# Patient Record
Sex: Male | Born: 1950 | Race: Black or African American | Hispanic: No | Marital: Single | Smoking: Current some day smoker
Health system: Southern US, Community
[De-identification: ages and names within clinical notes are randomized; demographics above are authoritative.]

## PROBLEM LIST (undated history)

## (undated) DIAGNOSIS — R160 Hepatomegaly, not elsewhere classified: Secondary | ICD-10-CM

## (undated) DIAGNOSIS — K219 Gastro-esophageal reflux disease without esophagitis: Secondary | ICD-10-CM

## (undated) DIAGNOSIS — F1020 Alcohol dependence, uncomplicated: Secondary | ICD-10-CM

## (undated) DIAGNOSIS — E785 Hyperlipidemia, unspecified: Secondary | ICD-10-CM

## (undated) DIAGNOSIS — B1920 Unspecified viral hepatitis C without hepatic coma: Secondary | ICD-10-CM

## (undated) DIAGNOSIS — C801 Malignant (primary) neoplasm, unspecified: Secondary | ICD-10-CM

## (undated) DIAGNOSIS — M1990 Unspecified osteoarthritis, unspecified site: Secondary | ICD-10-CM

## (undated) DIAGNOSIS — F1910 Other psychoactive substance abuse, uncomplicated: Secondary | ICD-10-CM

## (undated) DIAGNOSIS — I10 Essential (primary) hypertension: Secondary | ICD-10-CM

## (undated) DIAGNOSIS — H269 Unspecified cataract: Secondary | ICD-10-CM

## (undated) DIAGNOSIS — R911 Solitary pulmonary nodule: Secondary | ICD-10-CM

## (undated) DIAGNOSIS — I700 Atherosclerosis of aorta: Secondary | ICD-10-CM

## (undated) HISTORY — DX: Hepatomegaly, not elsewhere classified: R16.0

## (undated) HISTORY — DX: Alcohol dependence, uncomplicated: F10.20

## (undated) HISTORY — DX: Unspecified osteoarthritis, unspecified site: M19.90

## (undated) HISTORY — DX: Atherosclerosis of aorta: I70.0

## (undated) HISTORY — DX: Other psychoactive substance abuse, uncomplicated: F19.10

## (undated) HISTORY — DX: Gastro-esophageal reflux disease without esophagitis: K21.9

## (undated) HISTORY — PX: MANDIBLE FRACTURE SURGERY: SHX706

## (undated) HISTORY — DX: Hyperlipidemia, unspecified: E78.5

## (undated) HISTORY — DX: Solitary pulmonary nodule: R91.1

## (undated) HISTORY — PX: OTHER SURGICAL HISTORY: SHX169

## (undated) HISTORY — DX: Unspecified viral hepatitis C without hepatic coma: B19.20

---

## 2012-05-28 ENCOUNTER — Emergency Department (HOSPITAL_COMMUNITY)
Admission: EM | Admit: 2012-05-28 | Discharge: 2012-05-28 | Disposition: A | Discharge status: Home / Self Care | Payer: Self-pay | Attending: Emergency Medicine | Admitting: Emergency Medicine

## 2012-05-28 ENCOUNTER — Encounter (HOSPITAL_COMMUNITY): Payer: Self-pay | Admitting: Physical Medicine and Rehabilitation

## 2012-05-28 DIAGNOSIS — L03019 Cellulitis of unspecified finger: Secondary | ICD-10-CM | POA: Insufficient documentation

## 2012-05-28 DIAGNOSIS — IMO0002 Reserved for concepts with insufficient information to code with codable children: Secondary | ICD-10-CM

## 2012-05-28 DIAGNOSIS — F17200 Nicotine dependence, unspecified, uncomplicated: Secondary | ICD-10-CM | POA: Insufficient documentation

## 2012-05-28 DIAGNOSIS — Y92009 Unspecified place in unspecified non-institutional (private) residence as the place of occurrence of the external cause: Secondary | ICD-10-CM | POA: Insufficient documentation

## 2012-05-28 DIAGNOSIS — Y93E8 Activity, other personal hygiene: Secondary | ICD-10-CM | POA: Insufficient documentation

## 2012-05-28 DIAGNOSIS — W278XXA Contact with other nonpowered hand tool, initial encounter: Secondary | ICD-10-CM | POA: Insufficient documentation

## 2012-05-28 MED ORDER — HYDROCODONE-ACETAMINOPHEN 5-325 MG PO TABS: 2.0000 | ORAL_TABLET | ORAL | Status: DC | PRN

## 2012-05-28 MED ORDER — SULFAMETHOXAZOLE-TRIMETHOPRIM 800-160 MG PO TABS: 1.0000 | ORAL_TABLET | Freq: Two times a day (BID) | ORAL | Status: DC

## 2012-05-28 MED ORDER — HYDROCODONE-ACETAMINOPHEN 5-325 MG PO TABS
2.0000 | ORAL_TABLET | Freq: Once | ORAL | Status: AC
  Administered 2012-05-28: 2 via ORAL
  Filled 2012-05-28: qty 2

## 2012-05-28 NOTE — ED Notes (Signed)
Discharge instructions reviewed. Pt verbalized understanding.  

## 2012-05-28 NOTE — ED Provider Notes (Signed)
History     CSN: [id number]  Arrival date & time 05/28/12  7829   First MD Initiated Contact with Patient 05/28/12 1004      Chief Complaint  Patient presents with  . Finger Injury    (Consider location/radiation/quality/duration/timing/severity/associated sxs/prior treatment) HPI Comments: Patient is a 61 year old male who presents with a 1 week history of finger pain. The pain is located to his distal left 3rd finger and described as throbbing and severe. Patient believes he injured it when clipping his nails one week ago. The pain does not radiate and has associated swelling and redness of skin. Patient has not tried anything for relief. No aggravating/alleviating factors. Patient denies headaches, fever, chills, chest pain, SOB, abdominal pain, nvd, joint pain, red streaking of affected arm.    No past medical history on file.  No past surgical history on file.  History reviewed. No pertinent family history.  History  Substance Use Topics  . Smoking status: Current Every Day Smoker    Types: Cigarettes  . Smokeless tobacco: Not on file  . Alcohol Use: Yes      Review of Systems  Musculoskeletal:       Finger pain.  Skin: Positive for color change.  All other systems reviewed and are negative.    Allergies  Review of patient's allergies indicates no known allergies.  Home Medications  No current outpatient prescriptions on file.  BP 162/92  Pulse 99  Temp 98.4 F (36.9 C) (Oral)  Resp 22  SpO2 100%  Physical Exam  Nursing note and vitals reviewed. Constitutional: He is oriented to person, place, and time. He appears well-developed and well-nourished. No distress.  HENT:  Head: Normocephalic and atraumatic.  Eyes: Conjunctivae normal and EOM are normal. Pupils are equal, round, and reactive to light.  Neck: Normal range of motion. Neck supple.  Cardiovascular: Normal rate and regular rhythm.  Exam reveals no gallop and no friction rub.   No murmur  heard. Pulmonary/Chest: Effort normal and breath sounds normal. He has no wheezes. He has no rales. He exhibits no tenderness.  Abdominal: Soft. He exhibits no distension. There is no tenderness. There is no rebound and no guarding.  Musculoskeletal: Normal range of motion.       Distal left 3rd digit has notable swelling and tenderness to palpation around nail bed. Swelling is fluctuant and limited to distal phalanx. No finger pad tenderness. No other affected fingers.   Neurological: He is alert and oriented to person, place, and time. Coordination normal.       Strength and sensation equal and intact bilaterally. Speech is goal-oriented. Moves limbs without ataxia.   Skin: Skin is warm and dry. He is not diaphoretic.       Skin color change difficult to assess due to patient's dark complexion.   Psychiatric: He has a normal mood and affect. His behavior is normal.    ED Course  Procedures (including critical care time)  INCISION AND DRAINAGE Performed by: Emilia Beck Consent: Verbal consent obtained. Risks and benefits: risks, benefits and alternatives were discussed Type: abscess  Body area: distal left 3rd finger  Anesthesia: none  Complexity: complex Blunt dissection to break up loculations  Drainage: purulent  Drainage amount: 2 mL  Packing material: none  Patient tolerance: Patient tolerated the procedure well with no immediate complications.     Labs Reviewed - No data to display No results found.   1. Paronychia       MDM  10:17 AM Patient has distal left 3rd finger swelling. Likely caused by paronychia. I will give him pain medication and drain it.   11:40 AM Patient's paronychia drained without complication. He will go home with Bactrim and Vicodin. He should return with worsening or concerning symptoms. No further evaluation needed at this time.       Emilia Beck, PA-C 06/09/12 1143

## 2012-05-28 NOTE — ED Notes (Addendum)
Pt presents to department for evaluation of L middle finger swelling and pain. States swelling began last week when he clipped fingernail. No obvious deformities noted. 8/10 pain upon arrival. He is conscious alert and oriented x4. No signs of distress noted.

## 2012-05-28 NOTE — ED Notes (Addendum)
Pt states he doesn't know what happened to his finger, he remembers pulling a hang nail from the finger and days later it began to swell up and turn colors. Discoloration and swelling noted on left middle finger.

## 2012-06-11 NOTE — ED Provider Notes (Signed)
Medical screening examination/treatment/procedure(s) were performed by non-physician practitioner and as supervising physician I was immediately available for consultation/collaboration.   Lenette Rau, MD 06/11/12 0701 

## 2013-11-02 ENCOUNTER — Emergency Department (HOSPITAL_COMMUNITY)
Admission: EM | Admit: 2013-11-02 | Discharge: 2013-11-03 | Disposition: A | Discharge status: Home / Self Care | Payer: Medicaid Other | Attending: Emergency Medicine | Admitting: Emergency Medicine

## 2013-11-02 ENCOUNTER — Encounter (HOSPITAL_COMMUNITY): Payer: Self-pay | Admitting: Emergency Medicine

## 2013-11-02 ENCOUNTER — Emergency Department (HOSPITAL_COMMUNITY): Payer: Medicaid Other

## 2013-11-02 DIAGNOSIS — H269 Unspecified cataract: Secondary | ICD-10-CM | POA: Insufficient documentation

## 2013-11-02 DIAGNOSIS — R066 Hiccough: Secondary | ICD-10-CM | POA: Insufficient documentation

## 2013-11-02 DIAGNOSIS — F17200 Nicotine dependence, unspecified, uncomplicated: Secondary | ICD-10-CM | POA: Insufficient documentation

## 2013-11-02 DIAGNOSIS — G8929 Other chronic pain: Secondary | ICD-10-CM | POA: Insufficient documentation

## 2013-11-02 DIAGNOSIS — Z7982 Long term (current) use of aspirin: Secondary | ICD-10-CM | POA: Insufficient documentation

## 2013-11-02 DIAGNOSIS — R079 Chest pain, unspecified: Secondary | ICD-10-CM | POA: Insufficient documentation

## 2013-11-02 HISTORY — DX: Unspecified cataract: H26.9

## 2013-11-02 LAB — BASIC METABOLIC PANEL
BUN: 9 mg/dL (ref 6–23)
CHLORIDE: 100 meq/L (ref 96–112)
CO2: 25 meq/L (ref 19–32)
CREATININE: 0.7 mg/dL (ref 0.50–1.35)
Calcium: 9.9 mg/dL (ref 8.4–10.5)
GFR calc Af Amer: >90 mL/min (ref >90)
GFR calc non Af Amer: >90 mL/min (ref >90)
GLUCOSE: 98 mg/dL (ref 70–99)
Potassium: 4.6 mEq/L (ref 3.7–5.3)
Sodium: 139 mEq/L (ref 137–147)

## 2013-11-02 LAB — CBC
HEMATOCRIT: 43.8 % (ref 39.0–52.0)
Hemoglobin: 15.2 g/dL (ref 13.0–17.0)
MCH: 30.4 pg (ref 26.0–34.0)
MCHC: 34.7 g/dL (ref 30.0–36.0)
MCV: 87.6 fL (ref 78.0–100.0)
Platelets: 247 10*3/uL (ref 150–400)
RBC: 5 MIL/uL (ref 4.22–5.81)
RDW: 13.8 % (ref 11.5–15.5)
WBC: 5.8 10*3/uL (ref 4.0–10.5)

## 2013-11-02 NOTE — ED Notes (Signed)
Pt reports having the hiccups x 3 weeks. Having mid chest pain and "his chest draws up" x 1 month. Reports sob and nausea. No acute distress noted at triage, ekg done.

## 2013-11-02 NOTE — ED Notes (Signed)
MD at bedside. 

## 2013-11-02 NOTE — ED Provider Notes (Signed)
CSN: [id number]     Arrival date & time 11/02/13  1840 History   First MD Initiated Contact with Patient 11/02/13 2323     Chief Complaint  Patient presents with  . Chest Pain     (Consider location/radiation/quality/duration/timing/severity/associated sxs/prior Treatment) HPI Comments: 63 yo AA male with h/o chronic CP presents to ER with cc of CP and hiccups (intermittent) for 3 weeks.    Allergies: nkda Meds: tylenol PMH: no h/o AMI, + smoking, no Street drugs, suspects HTN, No DM  Patient is a 63 y.o. male presenting with chest pain. The history is provided by the patient.  Chest Pain Pain location:  Substernal area Pain quality: aching   Pain radiates to:  Does not radiate Pain radiates to the back: no   Pain severity:  Mild Onset quality:  Gradual Duration:  36 months Timing:  Constant Progression:  Resolved Chronicity:  Chronic Context: not breathing, no drug use, not eating, no intercourse, not lifting, no movement, not raising an arm, not at rest, no stress and no trauma   Relieved by:  None tried Worsened by:  Nothing tried Ineffective treatments:  None tried Associated symptoms: no abdominal pain, no AICD problem, no altered mental status, no anorexia, no anxiety, no back pain, no claudication, no cough, no diaphoresis, no dizziness, no dysphagia, no fatigue, no fever, no headache, no heartburn, no lower extremity edema, no nausea, no near-syncope, no numbness, no orthopnea, no palpitations, no PND, no shortness of breath, no syncope, not vomiting and no weakness     Past Medical History  Diagnosis Date  . Cataract    History reviewed. No pertinent past surgical history. History reviewed. No pertinent family history. History  Substance Use Topics  . Smoking status: Current Every Day Smoker    Types: Cigarettes  . Smokeless tobacco: Not on file  . Alcohol Use: Yes    Review of Systems  Constitutional: Negative for fever, chills, diaphoresis, activity change,  appetite change and fatigue.  HENT: Negative.  Negative for trouble swallowing.   Eyes:       H/o cataracts; scheduled for cataract surgery in April  Respiratory: Negative for apnea, cough, choking, chest tightness, shortness of breath, wheezing and stridor.   Cardiovascular: Positive for chest pain. Negative for palpitations, orthopnea, claudication, leg swelling, syncope, PND and near-syncope.  Gastrointestinal: Negative for heartburn, nausea, vomiting, abdominal pain and anorexia.  Endocrine: Negative.   Genitourinary: Negative.   Musculoskeletal: Negative for back pain, gait problem, joint swelling and myalgias.  Skin: Negative.   Allergic/Immunologic: Negative.   Neurological: Negative for dizziness, weakness, numbness and headaches.      Allergies  Review of patient's allergies indicates no known allergies.  Home Medications   Current Outpatient Rx  Name  Route  Sig  Dispense  Refill  . aspirin 81 MG chewable tablet   Oral   Chew 4 tablets (324 mg total) by mouth daily.   90 tablet   0    BP 131/84  Pulse 91  Temp(Src) 98.4 F (36.9 C) (Oral)  Resp 18  Ht 5\' 6"  (1.676 m)  Wt 135 lb (61.236 kg)  BMI 21.80 kg/m2  SpO2 98% Physical Exam  Nursing note and vitals reviewed. Constitutional: He is oriented to person, place, and time. He appears well-developed and well-nourished. No distress.  HENT:  Head: Normocephalic and atraumatic.  Eyes: Conjunctivae are normal.  bilat cataracts  Neck: Normal range of motion. Neck supple. No JVD present. No tracheal deviation present.  Cardiovascular: Normal rate, regular rhythm and normal heart sounds.  Exam reveals no friction rub.   No murmur heard. Pulmonary/Chest: Effort normal and breath sounds normal. No stridor. He has no wheezes. He has no rales.  Abdominal: Soft. Bowel sounds are normal. There is no tenderness. There is no rebound and no guarding.  Musculoskeletal: Normal range of motion. He exhibits no edema and no  tenderness.  Neurological: He is alert and oriented to person, place, and time. He has normal reflexes. A cranial nerve deficit is present. Coordination normal.  Skin: Skin is warm and dry. He is not diaphoretic.    ED Course  Procedures (including critical care time) Labs Review Labs Reviewed  CBC  BASIC METABOLIC PANEL  I-STAT [place], ED  [facility], ED   Imaging Review Dg Chest 2 View  11/02/2013   CLINICAL DATA:  Chest pain.  Hiccups.  EXAM: CHEST  2 VIEW  COMPARISON:  None.  FINDINGS: The heart size and mediastinal contours are within normal limits. Both lungs are clear. There is slight sclerosis of the right sternoclavicular joint. No free air under the diaphragm. In no visible dilated loops of bowel. Bilateral nipple shadows.  IMPRESSION: No acute abnormalities.   Electronically Signed   By: Rozetta Nunnery M.D.   On: 11/02/2013 20:12     EKG Interpretation   Date/Time:  Thursday November 02 2013 18:40:27 EDT Ventricular Rate:  109 PR Interval:  174 QRS Duration: 82 QT Interval:  318 QTC Calculation: 428 R Axis:   85 Text Interpretation:  Sinus tachycardia Minimal voltage criteria for LVH,  may be normal variant Septal infarct , age undetermined Abnormal ECG  Confirmed by [facility]  MD, DAVID ([id number]) on 11/02/2013 11:31:42 PM      MDM   Final diagnoses:  Chest pain  Hiccups    63 year old African male presents emergency bar with chief complaint of 3 weeks of chest pain and hiccups.  Prolonged stay in the emergency department awaiting results. Patient feeling much better and requesting to go on. ER workup negative to include EKG, chest x-ray, troponin two set r/o.  Doubt ACS, AD, PTx, pna, or other emergent etiology.    Patient counseled that he could potentially be admitted for his symptoms and have a further evaluation however he states he wants to go home. Strict ER precautions were given patient agrees with plan.  He is aware of the possibilities of an  undiagnosed/untreated emergent etiology for his symptoms and he is clear in thought and logic.    I initially was going to d/c him with ASA, but he has an upcoming ocular surgery (cataract) so I opted to hold.  He was informed of my thoughts on ASA.      Elmer Sow, MD 11/03/13 [phone]

## 2013-11-02 NOTE — ED Notes (Signed)
I-Stat troponin first draw never resulted in EPIC, consulted with mini-lab, result time 1944: 0.00.

## 2013-11-03 LAB — I-STAT TROPONIN, ED
Troponin i, poc: 0 ng/mL (ref 0.00–0.08)
Troponin i, poc: 0 ng/mL (ref 0.00–0.08)

## 2013-11-03 MED ORDER — ASPIRIN 81 MG PO CHEW: 324.0000 mg | CHEWABLE_TABLET | Freq: Every day | ORAL | Status: DC

## 2013-11-03 NOTE — Discharge Instructions (Signed)
Chest Pain (Nonspecific) °It is often hard to give a specific diagnosis for the cause of chest pain. There is always a chance that your pain could be related to something serious, such as a heart attack or a blood clot in the lungs. You need to follow up with your caregiver for further evaluation. °CAUSES  °· Heartburn. °· Pneumonia or bronchitis. °· Anxiety or stress. °· Inflammation around your heart (pericarditis) or lung (pleuritis or pleurisy). °· A blood clot in the lung. °· A collapsed lung (pneumothorax). It can develop suddenly on its own (spontaneous pneumothorax) or from injury (trauma) to the chest. °· Shingles infection (herpes zoster virus). °The chest wall is composed of bones, muscles, and cartilage. Any of these can be the source of the pain. °· The bones can be bruised by injury. °· The muscles or cartilage can be strained by coughing or overwork. °· The cartilage can be affected by inflammation and become sore (costochondritis). °DIAGNOSIS  °Lab tests or other studies, such as X-rays, electrocardiography, stress testing, or cardiac imaging, may be needed to find the cause of your pain.  °TREATMENT  °· Treatment depends on what may be causing your chest pain. Treatment may include: °· Acid blockers for heartburn. °· Anti-inflammatory medicine. °· Pain medicine for inflammatory conditions. °· Antibiotics if an infection is present. °· You may be advised to change lifestyle habits. This includes stopping smoking and avoiding alcohol, caffeine, and chocolate. °· You may be advised to keep your head raised (elevated) when sleeping. This reduces the chance of acid going backward from your stomach into your esophagus. °· Most of the time, nonspecific chest pain will improve within 2 to 3 days with rest and mild pain medicine. °HOME CARE INSTRUCTIONS  °· If antibiotics were prescribed, take your antibiotics as directed. Finish them even if you start to feel better. °· For the next few days, avoid physical  activities that bring on chest pain. Continue physical activities as directed. °· Do not smoke. °· Avoid drinking alcohol. °· Only take over-the-counter or prescription medicine for pain, discomfort, or fever as directed by your caregiver. °· Follow your caregiver's suggestions for further testing if your chest pain does not go away. °· Keep any follow-up appointments you made. If you do not go to an appointment, you could develop lasting (chronic) problems with pain. If there is any problem keeping an appointment, you must call to reschedule. °SEEK MEDICAL CARE IF:  °· You think you are having problems from the medicine you are taking. Read your medicine instructions carefully. °· Your chest pain does not go away, even after treatment. °· You develop a rash with blisters on your chest. °SEEK IMMEDIATE MEDICAL CARE IF:  °· You have increased chest pain or pain that spreads to your arm, neck, jaw, back, or abdomen. °· You develop shortness of breath, an increasing cough, or you are coughing up blood. °· You have severe back or abdominal pain, feel nauseous, or vomit. °· You develop severe weakness, fainting, or chills. °· You have a fever. °THIS IS AN EMERGENCY. Do not wait to see if the pain will go away. Get medical help at once. Call your local emergency services (911 in [place] Do not drive yourself to the hospital. °MAKE SURE YOU:  °· Understand these instructions. °· Will watch your condition. °· Will get help right away if you are not doing well or get worse. °Document Released: 04/29/2005 Document Revised: 10/12/2011 Document Reviewed: 02/23/2008 °ExitCare® Patient Information ©2014 [employer]   [employer]. ° °

## 2013-11-03 NOTE — ED Notes (Signed)
MD did not end up sending pt home with prescription for aspirin, pt has an upcoming surgery. Pt verbalizes understanding.

## 2013-11-03 NOTE — ED Notes (Signed)
Pt reports he has to go. MD informed.

## 2014-01-05 ENCOUNTER — Ambulatory Visit: Payer: Medicaid Other | Attending: Internal Medicine | Admitting: Internal Medicine

## 2014-01-05 ENCOUNTER — Encounter: Payer: Self-pay | Admitting: Internal Medicine

## 2014-01-05 VITALS — BP 162/97 | HR 100 | Temp 99.2°F | Resp 17 | Wt 136.0 lb

## 2014-01-05 DIAGNOSIS — I10 Essential (primary) hypertension: Secondary | ICD-10-CM | POA: Diagnosis not present

## 2014-01-05 DIAGNOSIS — F17200 Nicotine dependence, unspecified, uncomplicated: Secondary | ICD-10-CM | POA: Insufficient documentation

## 2014-01-05 DIAGNOSIS — Z7982 Long term (current) use of aspirin: Secondary | ICD-10-CM | POA: Insufficient documentation

## 2014-01-05 DIAGNOSIS — Z720 Tobacco use: Secondary | ICD-10-CM | POA: Insufficient documentation

## 2014-01-05 DIAGNOSIS — H269 Unspecified cataract: Secondary | ICD-10-CM | POA: Diagnosis not present

## 2014-01-05 LAB — CBC WITH DIFFERENTIAL/PLATELET
BASOS ABS: 0 10*3/uL (ref 0.0–0.1)
Basophils Relative: 0 % (ref 0–1)
Eosinophils Absolute: 0.1 10*3/uL (ref 0.0–0.7)
Eosinophils Relative: 1 % (ref 0–5)
HEMATOCRIT: 38.3 % — AB (ref 39.0–52.0)
HEMOGLOBIN: 12.8 g/dL — AB (ref 13.0–17.0)
LYMPHS PCT: 20 % (ref 12–46)
Lymphs Abs: 1.9 10*3/uL (ref 0.7–4.0)
MCH: 29.2 pg (ref 26.0–34.0)
MCHC: 33.4 g/dL (ref 30.0–36.0)
MCV: 87.2 fL (ref 78.0–100.0)
MONO ABS: 1.1 10*3/uL — AB (ref 0.1–1.0)
MONOS PCT: 12 % (ref 3–12)
NEUTROS ABS: 6.2 10*3/uL (ref 1.7–7.7)
Neutrophils Relative %: 67 % (ref 43–77)
Platelets: 162 10*3/uL (ref 150–400)
RBC: 4.39 MIL/uL (ref 4.22–5.81)
RDW: 13.2 % (ref 11.5–15.5)
WBC: 9.3 10*3/uL (ref 4.0–10.5)

## 2014-01-05 MED ORDER — AMLODIPINE BESYLATE 2.5 MG PO TABS: 2.5000 mg | ORAL_TABLET | Freq: Every day | ORAL | Status: DC

## 2014-01-05 NOTE — Progress Notes (Signed)
Patient Demographics  Calvin Gardner, is a 63 y.o. male  OEU:[id number]  XVQ:[id number]  DOB - [birth year]  CC:  Chief Complaint  Patient presents with  . Establish Care       HPI: Calvin Gardner is a 63 y.o. male here today to establish medical care. Patient has history of right eye cataract and is scheduled for surgery next week, today his blood pressure is elevated denies any headache dizziness chest and shortness of breath, EMR reviewed his blood pressure had been elevated in the past, patient is not sure about family history of  blood pressure problem, patient does not take any medications he does smoke cigarettes everyday, have advised patient to quit smoking, used to drink heavily in the past but he has not drink for the last 10 days  Patient has No headache, No chest pain, No abdominal pain - No Nausea, No new weakness tingling or numbness, No Cough - SOB.  No Known Allergies Past Medical History  Diagnosis Date  . Cataract    Current Outpatient Prescriptions on File Prior to Visit  Medication Sig Dispense Refill  . aspirin 81 MG chewable tablet Chew 4 tablets (324 mg total) by mouth daily.  90 tablet  0   No current facility-administered medications on file prior to visit.   History reviewed. No pertinent family history. History   Social History  . Marital Status: Divorced    Spouse Name: N/A    Number of Children: N/A  . Years of Education: N/A   Occupational History  . Not on file.   Social History Main Topics  . Smoking status: Current Every Day Smoker    Types: Cigarettes  . Smokeless tobacco: Not on file  . Alcohol Use: 6.0 oz/week    10 Cans of beer per week     Comment: quit for 10 days  . Drug Use: No  . Sexual Activity: Not on file   Other Topics Concern  . Not on file   Social History Narrative  . No narrative on file    Review of Systems: Constitutional: Negative for fever, chills, diaphoresis, activity change, appetite change and  fatigue. HENT: Negative for ear pain, nosebleeds, congestion, facial swelling, rhinorrhea, neck pain, neck stiffness and ear discharge.  Eyes: Negative for pain, discharge, redness, itching and visual disturbance. Respiratory: Negative for cough, choking, chest tightness, shortness of breath, wheezing and stridor.  Cardiovascular: Negative for chest pain, palpitations and leg swelling. Gastrointestinal: Negative for abdominal distention. Genitourinary: Negative for dysuria, urgency, frequency, hematuria, flank pain, decreased urine volume, difficulty urinating and dyspareunia.  Musculoskeletal: Negative for back pain, joint swelling, arthralgia and gait problem. Neurological: Negative for dizziness, tremors, seizures, syncope, facial asymmetry, speech difficulty, weakness, light-headedness, numbness and headaches.  Hematological: Negative for adenopathy. Does not bruise/bleed easily. Psychiatric/Behavioral: Negative for hallucinations, behavioral problems, confusion, dysphoric mood, decreased concentration and agitation.    Objective:   Filed Vitals:   01/05/14 1556  BP: 162/97  Pulse: 100  Temp: 99.2 F (37.3 C)  Resp: 17    Physical Exam: Constitutional: Patient appears well-developed and well-nourished. No distress. HENT: Normocephalic, atraumatic, External right and left ear normal. Oropharynx is clear and moist.  Eyes: Conjunctivae and EOM are normal. PERRLA, no scleral icterus. Right eye cataract Neck: Normal ROM. Neck supple. No JVD. No tracheal deviation. No thyromegaly. CVS: RRR, S1/S2 +, no murmurs, no gallops, no carotid bruit.  Pulmonary: Effort and breath sounds normal, no stridor, rhonchi, wheezes, rales.  Abdominal:  Soft. BS +, no distension, tenderness, rebound or guarding.  Musculoskeletal: Normal range of motion. No edema and no tenderness.  Neuro: Alert. Normal reflexes, muscle tone coordination. No cranial nerve deficit. Skin: Skin is warm and dry. No rash noted.  Not diaphoretic. No erythema. No pallor. Psychiatric: Normal mood and affect. Behavior, judgment, thought content normal.  Lab Results  Component Value Date   WBC 5.8 11/02/2013   HGB 15.2 11/02/2013   HCT 43.8 11/02/2013   MCV 87.6 11/02/2013   PLT 247 11/02/2013   Lab Results  Component Value Date   CREATININE 0.70 11/02/2013   BUN 9 11/02/2013   NA 139 11/02/2013   K 4.6 11/02/2013   CL 100 11/02/2013   CO2 25 11/02/2013    No results found for this basename: HGBA1C   Lipid Panel  No results found for this basename: chol, trig, hdl, cholhdl, vldl, ldlcalc       Assessment and plan:   1. Cataract, right eye Patient is going to follow with his ophthalmologist scheduled for surgery.  2. Smoking I advised patient to quit smoking.  3. Essential hypertension, benign I have advised patient for DASH diet and started on low-dose Norvasc 2.5 mg daily. Her we'll do baseline blood work. - amLODipine (NORVASC) 2.5 MG tablet; Take 1 tablet (2.5 mg total) by mouth daily.  Dispense: 90 tablet; Refill: 3 - CBC with Differential - COMPLETE METABOLIC PANEL WITH GFR - TSH - Lipid panel - Vit D  25 hydroxy (rtn osteoporosis monitoring)  Return in about 3 months (around 04/07/2014) for hypertension, BP check in 2 weeks/Nurse Visit.    Lorayne Marek, MD

## 2014-01-05 NOTE — Progress Notes (Signed)
Patient here to establish care Takes no prescribed medications Will be having cataracts surgery next week

## 2014-01-05 NOTE — Patient Instructions (Signed)
DASH Diet  The DASH diet stands for "Dietary Approaches to Stop Hypertension." It is a healthy eating plan that has been shown to reduce high blood pressure (hypertension) in as little as 14 days, while also possibly providing other significant health benefits. These other health benefits include reducing the risk of breast cancer after menopause and reducing the risk of type 2 diabetes, heart disease, colon cancer, and stroke. Health benefits also include weight loss and slowing kidney failure in patients with chronic kidney disease.   DIET GUIDELINES  · Limit salt (sodium). Your diet should contain less than 1500 mg of sodium daily.  · Limit refined or processed carbohydrates. Your diet should include mostly whole grains. Desserts and added sugars should be used sparingly.  · Include small amounts of heart-healthy fats. These types of fats include nuts, oils, and tub margarine. Limit saturated and trans fats. These fats have been shown to be harmful in the body.  CHOOSING FOODS   The following food groups are based on a 2000 calorie diet. See your Registered Dietitian for individual calorie needs.  Grains and Grain Products (6 to 8 servings daily)  · Eat More Often: Whole-wheat bread, brown rice, whole-grain or wheat pasta, quinoa, popcorn without added fat or salt (air popped).  · Eat Less Often: White bread, white pasta, white rice, cornbread.  Vegetables (4 to 5 servings daily)  · Eat More Often: Fresh, frozen, and canned vegetables. Vegetables may be raw, steamed, roasted, or grilled with a minimal amount of fat.  · Eat Less Often/Avoid: Creamed or fried vegetables. Vegetables in a cheese sauce.  Fruit (4 to 5 servings daily)  · Eat More Often: All fresh, canned (in natural juice), or frozen fruits. Dried fruits without added sugar. One hundred percent fruit juice (½ cup [237 mL] daily).  · Eat Less Often: Dried fruits with added sugar. Canned fruit in light or heavy syrup.  Lean Meats, Fish, and Poultry (2  servings or less daily. One serving is 3 to 4 oz [85-114 g]).  · Eat More Often: Ninety percent or leaner ground beef, tenderloin, sirloin. Round cuts of beef, chicken breast, turkey breast. All fish. Grill, bake, or broil your meat. Nothing should be fried.  · Eat Less Often/Avoid: Fatty cuts of meat, turkey, or chicken leg, thigh, or wing. Fried cuts of meat or fish.  Dairy (2 to 3 servings)  · Eat More Often: Low-fat or fat-free milk, low-fat plain or light yogurt, reduced-fat or part-skim cheese.  · Eat Less Often/Avoid: Milk (whole, 2%). Whole milk yogurt. Full-fat cheeses.  Nuts, Seeds, and Legumes (4 to 5 servings per week)  · Eat More Often: All without added salt.  · Eat Less Often/Avoid: Salted nuts and seeds, canned beans with added salt.  Fats and Sweets (limited)  · Eat More Often: Vegetable oils, tub margarines without trans fats, sugar-free gelatin. Mayonnaise and salad dressings.  · Eat Less Often/Avoid: Coconut oils, palm oils, butter, stick margarine, cream, half and half, cookies, candy, pie.  FOR MORE INFORMATION  The Dash Diet Eating Plan: www.dashdiet.org  Document Released: 07/09/2011 Document Revised: 10/12/2011 Document Reviewed: 07/09/2011  ExitCare® Patient Information ©2014 [employer].

## 2014-01-06 LAB — COMPLETE METABOLIC PANEL WITH GFR
ALBUMIN: 4.3 g/dL (ref 3.5–5.2)
ALT: 57 U/L — ABNORMAL HIGH (ref 0–53)
AST: 58 U/L — AB (ref 0–37)
Alkaline Phosphatase: 45 U/L (ref 39–117)
BUN: 13 mg/dL (ref 6–23)
CALCIUM: 9.9 mg/dL (ref 8.4–10.5)
CHLORIDE: 101 meq/L (ref 96–112)
CO2: 20 mEq/L (ref 19–32)
Creat: 0.78 mg/dL (ref 0.50–1.35)
GFR, Est African American: >89 mL/min
GFR, Est Non African American: >89 mL/min
GLUCOSE: 106 mg/dL — AB (ref 70–99)
POTASSIUM: 4.3 meq/L (ref 3.5–5.3)
SODIUM: 133 meq/L — AB (ref 135–145)
TOTAL PROTEIN: 7.2 g/dL (ref 6.0–8.3)
Total Bilirubin: 0.8 mg/dL (ref 0.2–1.2)

## 2014-01-06 LAB — LIPID PANEL
CHOL/HDL RATIO: 2.2 ratio
Cholesterol: 177 mg/dL (ref 0–200)
HDL: 79 mg/dL (ref >39)
LDL CALC: 86 mg/dL (ref 0–99)
Triglycerides: 59 mg/dL (ref <150)
VLDL: 12 mg/dL (ref 0–40)

## 2014-01-06 LAB — TSH: TSH: 0.755 u[IU]/mL (ref 0.350–4.500)

## 2014-01-06 LAB — VITAMIN D 25 HYDROXY (VIT D DEFICIENCY, FRACTURES): VIT D 25 HYDROXY: 18 ng/mL — AB (ref 30–89)

## 2014-01-08 ENCOUNTER — Telehealth: Payer: Self-pay

## 2014-01-08 DIAGNOSIS — D649 Anemia, unspecified: Secondary | ICD-10-CM

## 2014-01-08 MED ORDER — VITAMIN D (ERGOCALCIFEROL) 1.25 MG (50000 UNIT) PO CAPS: 50000.0000 [IU] | ORAL_CAPSULE | ORAL | Status: DC

## 2014-01-08 NOTE — Telephone Encounter (Signed)
Message copied by Dorothe Pea on Mon Jan 08, 2014  2:54 PM ------      Message from: Lorayne Marek      Created: Mon Jan 08, 2014  9:25 AM       Blood work reviewed, noticed low vitamin D, call patient advise to start ergocalciferol 50,000 units once a week for the duration of  12 weeks.      Also  noticed impaired fasting glucose, call and advise patient for low carbohydrate diet.      Also noticed borderline abnormal LFTs, advised patient to avoid alcohol and Tylenol, will repeat LFTs in the next visit.      Noticed borderline anemia, please put in the referral to GI for screening colonoscopy. ------

## 2014-01-19 ENCOUNTER — Ambulatory Visit: Payer: Medicaid Other | Attending: Internal Medicine | Admitting: *Deleted

## 2014-01-19 NOTE — Patient Instructions (Signed)
Hypertension Hypertension is another name for high blood pressure. High blood pressure forces your heart to work harder to pump blood. A blood pressure reading has two numbers, which includes a higher number over a lower number (example: 110/72). HOME CARE   Have your blood pressure rechecked by your doctor.  Only take medicine as told by your doctor. Follow the directions carefully. The medicine does not work as well if you skip doses. Skipping doses also puts you at risk for problems.  Do not smoke.  Monitor your blood pressure at home as told by your doctor. GET HELP IF:  You think you are having a reaction to the medicine you are taking.  You have repeat headaches or feel dizzy.  You have puffiness (swelling) in your ankles.  You have trouble with your vision. GET HELP RIGHT AWAY IF:   You get a very bad headache and are confused.  You feel weak, numb, or faint.  You get chest or belly (abdominal) pain.  You throw up (vomit).  You cannot breathe very well. MAKE SURE YOU:   Understand these instructions.  Will watch your condition.  Will get help right away if you are not doing well or get worse. Document Released: 01/06/2008 Document Revised: 07/25/2013 Document Reviewed: 05/12/2013 Exit[employer] Patient Information 2015 [employer]. This information is not intended to replace advice given to you by your health care provider. Make sure you discuss any questions you have with your health care provider.  

## 2014-01-24 ENCOUNTER — Other Ambulatory Visit: Payer: Self-pay | Admitting: Gastroenterology

## 2014-01-25 ENCOUNTER — Encounter (HOSPITAL_COMMUNITY): Payer: Self-pay | Admitting: Pharmacy Technician

## 2014-01-29 ENCOUNTER — Encounter (HOSPITAL_COMMUNITY): Payer: Self-pay | Admitting: *Deleted

## 2014-01-30 ENCOUNTER — Encounter (HOSPITAL_COMMUNITY)
Admission: RE | Disposition: A | Discharge status: Home / Self Care | Payer: Self-pay | Source: Ambulatory Visit | Attending: Gastroenterology

## 2014-01-30 ENCOUNTER — Encounter (HOSPITAL_COMMUNITY): Payer: Self-pay | Admitting: *Deleted

## 2014-01-30 ENCOUNTER — Ambulatory Visit (HOSPITAL_COMMUNITY)
Admission: RE | Admit: 2014-01-30 | Discharge: 2014-01-30 | Disposition: A | Discharge status: Home / Self Care | Payer: Medicaid Other | Source: Ambulatory Visit | Attending: Gastroenterology | Admitting: Gastroenterology

## 2014-01-30 ENCOUNTER — Ambulatory Visit (HOSPITAL_COMMUNITY): Payer: Medicaid Other | Admitting: Anesthesiology

## 2014-01-30 ENCOUNTER — Encounter (HOSPITAL_COMMUNITY): Payer: Medicaid Other | Admitting: Anesthesiology

## 2014-01-30 DIAGNOSIS — D126 Benign neoplasm of colon, unspecified: Secondary | ICD-10-CM | POA: Diagnosis not present

## 2014-01-30 DIAGNOSIS — R7402 Elevation of levels of lactic acid dehydrogenase (LDH): Secondary | ICD-10-CM | POA: Insufficient documentation

## 2014-01-30 DIAGNOSIS — I10 Essential (primary) hypertension: Secondary | ICD-10-CM | POA: Diagnosis not present

## 2014-01-30 DIAGNOSIS — K5730 Diverticulosis of large intestine without perforation or abscess without bleeding: Secondary | ICD-10-CM | POA: Insufficient documentation

## 2014-01-30 DIAGNOSIS — R740 Nonspecific elevation of levels of transaminase and lactic acid dehydrogenase [LDH]: Secondary | ICD-10-CM

## 2014-01-30 DIAGNOSIS — Z1211 Encounter for screening for malignant neoplasm of colon: Secondary | ICD-10-CM | POA: Insufficient documentation

## 2014-01-30 DIAGNOSIS — Z860100 Personal history of colon polyps, unspecified: Secondary | ICD-10-CM | POA: Insufficient documentation

## 2014-01-30 DIAGNOSIS — B182 Chronic viral hepatitis C: Secondary | ICD-10-CM | POA: Insufficient documentation

## 2014-01-30 DIAGNOSIS — F17200 Nicotine dependence, unspecified, uncomplicated: Secondary | ICD-10-CM | POA: Diagnosis not present

## 2014-01-30 DIAGNOSIS — R7401 Elevation of levels of liver transaminase levels: Secondary | ICD-10-CM | POA: Diagnosis not present

## 2014-01-30 HISTORY — PX: ESOPHAGOGASTRODUODENOSCOPY (EGD) WITH PROPOFOL: SHX5813

## 2014-01-30 HISTORY — PX: COLONOSCOPY WITH PROPOFOL: SHX5780

## 2014-01-30 HISTORY — DX: Essential (primary) hypertension: I10

## 2014-01-30 SURGERY — COLONOSCOPY WITH PROPOFOL
Anesthesia: Monitor Anesthesia Care

## 2014-01-30 MED ORDER — SODIUM CHLORIDE 0.9 % IV SOLN: INTRAVENOUS | Status: DC

## 2014-01-30 MED ORDER — FENTANYL CITRATE 0.05 MG/ML IJ SOLN
INTRAMUSCULAR | Status: DC | PRN
  Administered 2014-01-30: 50 ug via INTRAVENOUS
  Administered 2014-01-30 (×2): 25 ug via INTRAVENOUS

## 2014-01-30 MED ORDER — PROPOFOL INFUSION 10 MG/ML OPTIME
INTRAVENOUS | Status: DC | PRN
  Administered 2014-01-30: 100 ug/kg/min via INTRAVENOUS

## 2014-01-30 MED ORDER — KETAMINE HCL 10 MG/ML IJ SOLN
INTRAMUSCULAR | Status: DC | PRN
  Administered 2014-01-30: 20 mg via INTRAVENOUS

## 2014-01-30 MED ORDER — BUTAMBEN-TETRACAINE-BENZOCAINE 2-2-14 % EX AERO
INHALATION_SPRAY | CUTANEOUS | Status: DC | PRN
  Administered 2014-01-30: 1 via TOPICAL

## 2014-01-30 MED ORDER — LACTATED RINGERS IV SOLN
INTRAVENOUS | Status: DC | PRN
  Administered 2014-01-30: 12:00:00 via INTRAVENOUS

## 2014-01-30 MED ORDER — MIDAZOLAM HCL 5 MG/5ML IJ SOLN
INTRAMUSCULAR | Status: DC | PRN
  Administered 2014-01-30: 1 mg via INTRAVENOUS
  Administered 2014-01-30: 0.5 mg via INTRAVENOUS
  Administered 2014-01-30: 1 mg via INTRAVENOUS
  Administered 2014-01-30 (×2): 0.5 mg via INTRAVENOUS

## 2014-01-30 MED ORDER — LACTATED RINGERS IV SOLN
INTRAVENOUS | Status: DC
Start: 2014-01-30 — End: 2014-01-30
  Administered 2014-01-30: 1000 mL via INTRAVENOUS

## 2014-01-30 SURGICAL SUPPLY — 25 items
BLOCK BITE 60FR ADLT L/F BLUE (MISCELLANEOUS) ×4 IMPLANT
ELECT REM PT RETURN 9FT ADLT (ELECTROSURGICAL)
ELECTRODE REM PT RTRN 9FT ADLT (ELECTROSURGICAL) IMPLANT
FCP BXJMBJMB 240X2.8X (CUTTING FORCEPS)
FLOOR PAD 36X40 (MISCELLANEOUS) ×3
FORCEP RJ3 GP 1.8X160 W-NEEDLE (CUTTING FORCEPS) IMPLANT
FORCEPS BIOP RAD 4 LRG CAP 4 (CUTTING FORCEPS) IMPLANT
FORCEPS BIOP RJ4 240 W/NDL (CUTTING FORCEPS)
FORCEPS BXJMBJMB 240X2.8X (CUTTING FORCEPS) IMPLANT
INJECTOR/SNARE I SNARE (MISCELLANEOUS) IMPLANT
LUBRICANT JELLY 4.5OZ STERILE (MISCELLANEOUS) IMPLANT
MANIFOLD NEPTUNE II (INSTRUMENTS) IMPLANT
NDL SCLEROTHERAPY 25GX240 (NEEDLE) IMPLANT
NEEDLE SCLEROTHERAPY 25GX240 (NEEDLE) IMPLANT
PAD FLOOR 36X40 (MISCELLANEOUS) ×2 IMPLANT
PROBE APC STR FIRE (PROBE) IMPLANT
PROBE INJECTION GOLD (MISCELLANEOUS)
PROBE INJECTION GOLD 7FR (MISCELLANEOUS) IMPLANT
SNARE ROTATE MED OVAL 20MM (MISCELLANEOUS) IMPLANT
SNARE SHORT THROW 13M SML OVAL (MISCELLANEOUS) IMPLANT
SYR 50ML LL SCALE MARK (SYRINGE) IMPLANT
TRAP SPECIMEN MUCOUS 40CC (MISCELLANEOUS) IMPLANT
TUBING ENDO SMARTCAP PENTAX (MISCELLANEOUS) ×8 IMPLANT
TUBING IRRIGATION ENDOGATOR (MISCELLANEOUS) ×4 IMPLANT
WATER STERILE IRR 1000ML POUR (IV SOLUTION) IMPLANT

## 2014-01-30 NOTE — Anesthesia Postprocedure Evaluation (Signed)
Anesthesia Post Note  Patient: Calvin Gardner  Procedure(s) Performed: Procedure(s) (LRB): COLONOSCOPY WITH PROPOFOL (N/A) ESOPHAGOGASTRODUODENOSCOPY (EGD) WITH PROPOFOL (N/A)  Anesthesia type: MAC  Patient location: PACU  Post pain: Pain level controlled  Post assessment: Post-op Vital signs reviewed  Last Vitals: BP 132/76  Pulse 64  Temp(Src) 36.7 C (Oral)  Resp 18  Ht 5\' 6"  (1.676 m)  Wt 134 lb (60.782 kg)  BMI 21.64 kg/m2  SpO2 92%  Post vital signs: Reviewed  Level of consciousness: awake  Complications: No apparent anesthesia complications

## 2014-01-30 NOTE — H&P (Signed)
  Problems: Resolved normocytic anemia. Elevated liver transaminases. Low 25 hydroxy vitamin D level. Positive hepatitis C antibody. Negative hepatitis B surface antigen. Positive hepatitis B core antibody.  History: The patient is a 63 year old male born 14-Nov-1950. On 01/05/2014 the patient's hemoglobin was measured at 12.8 g. On 01/24/2014 his CBC was normal. The patient has elevated liver transaminases. His hepatitis C antibody was positive and his hepatitis B core antibody was positive. His hepatitis B surface antigen was negative. Serum ferritin was 469.7 ng/mL. Vitamin B12 level was normal.  The patient is scheduled to undergo diagnostic esophagogastroduodenoscopy and colonoscopy.  I will also check quantitative HCV RNA by PCR, HCV genotype, hepatitis B. surface antibody, and hepatitis A antibody.   Medication allergies: None  Past medical history: Hypertension. Right cataract surgery.  Exam: The patient is alert and lying comfortably on the endoscopy stretcher. Cardiac exam reveals a regular rhythm. Lungs are clear to auscultation. Abdomen is soft and nontender to palpation.  Plan: Proceed with diagnostic esophagogastroduodenoscopy and screening colonoscopy.

## 2014-01-30 NOTE — Anesthesia Preprocedure Evaluation (Signed)
Anesthesia Evaluation  Patient identified by MRN, date of birth, ID band Patient awake    Reviewed: Allergy & Precautions, H&P , NPO status , Patient's Chart, lab work & pertinent test results  Airway Mallampati: II TM Distance: >3 FB Neck ROM: Full    Dental  (+) Dental Advisory Given   Pulmonary neg pulmonary ROS, Current Smoker,  breath sounds clear to auscultation        Cardiovascular hypertension, Pt. on medications Rhythm:Regular Rate:Normal     Neuro/Psych negative neurological ROS  negative psych ROS   GI/Hepatic negative GI ROS, Neg liver ROS,   Endo/Other  negative endocrine ROS  Renal/GU negative Renal ROS     Musculoskeletal negative musculoskeletal ROS (+)   Abdominal   Peds  Hematology negative hematology ROS (+)   Anesthesia Other Findings   Reproductive/Obstetrics negative OB ROS                           Anesthesia Physical Anesthesia Plan  ASA: II  Anesthesia Plan: MAC   Post-op Pain Management:    Induction: Intravenous  Airway Management Planned:   Additional Equipment:   Intra-op Plan:   Post-operative Plan:   Informed Consent: I have reviewed the patients History and Physical, chart, labs and discussed the procedure including the risks, benefits and alternatives for the proposed anesthesia with the patient or authorized representative who has indicated his/her understanding and acceptance.   Dental advisory given  Plan Discussed with: CRNA  Anesthesia Plan Comments:         Anesthesia Quick Evaluation

## 2014-01-30 NOTE — Op Note (Signed)
Problems: Resolved normocytic anemia. Normal vitamin B12 level. Serum ferritin 469.7 ng/mL. Elevated liver transaminases. Positive hepatitis C antibody. Positive hepatitis B core antibody. Negative hepatitis B surface antigen.  Endoscopist: Earle Gell  Premedication: Propofol administered by anesthesia  Procedure: Diagnostic esophagogastroduodenoscopy The patient was placed in the left lateral decubitus position. The Pentax gastroscope was passed through the posterior hypopharynx into the proximal esophagus without difficulty. The vocal cords were not visualized.  Esophagoscopy: The proximal, mid, and lower segments of the esophageal mucosa appeared normal. The squamocolumnar junction was noted at 40 cm from the incisor teeth. There was no endoscopic evidence for the presence of esophageal varices.  Gastroscopy: Retroflex view of the gastric cardia and fundus was normal. The gastric body, antrum, and pylorus appeared normal. No gastric varices were present  Duodenoscopy: The duodenal bulb and descending duodenum appeared normal.  Assessment: Normal esophagogastroduodenoscopy.  Procedure: Screening colonoscopy Anal inspection and digital rectal exam were normal. The Pentax pediatric colonoscope was introduced into the rectum and advanced to the cecum. A normal-appearing appendiceal orifice and ileocecal valve were identified. Colonic preparation for the exam today was good.  The patient has universal colonic diverticulosis.  Rectum. Normal. Retroflex view of the distal rectum normal  Sigmoid colon. From the mid sigmoid colon at 35 cm from the anal verge a 1 cm pedunculated polyp was removed with the electrocautery snare and submitted for pathological interpretation. An Endo Clip was applied to the polypectomy site to prevent bleeding.  Descending colon.Normal   Splenic flexure. Normal  Transverse colon. Normal  Hepatic flexure. Normal  Ascending colon. From the proximal  ascending colon, a 7 mm pedunculated polyp was removed with the electrocautery snare and submitted for pathological interpretation.  Cecum and ileocecal valve. Normal  Assessment:  #1. Universal colonic diverticulosis  #2. From the sigmoid colon at 35 cm from the anal verge a 1 cm pedunculated polyp was removed with the electrocautery snare and submitted for pathological interpretation. An Endo Clip was applied to the polypectomy to site to prevent bleeding  #3. From the proximal ascending colon, a 7 mm pedunculated polyp was removed with the electrocautery snare and submitted for pathological interpretation  Recommendation:  #1. If colon polyps returned neoplastic pathologically, the patient should undergo a surveillance colonoscopy in 3 years  #2. Check quantitative HCV RNA by PCR, HCV genotype, hepatitis B surface antibody, hepatitis A antibody

## 2014-01-30 NOTE — Transfer of Care (Signed)
Immediate Anesthesia Transfer of Care Note  Patient: Calvin Gardner  Procedure(s) Performed: Procedure(s): COLONOSCOPY WITH PROPOFOL (N/A) ESOPHAGOGASTRODUODENOSCOPY (EGD) WITH PROPOFOL (N/A)  Patient Location: PACU  Anesthesia Type:MAC  Level of Consciousness: sedated and patient cooperative  Airway & Oxygen Therapy: Patient Spontanous Breathing and Patient connected to nasal cannula oxygen  Post-op Assessment: Report given to PACU RN and Post -op Vital signs reviewed and stable  Post vital signs: stable  Complications: No apparent anesthesia complications

## 2014-01-31 ENCOUNTER — Encounter (HOSPITAL_COMMUNITY): Payer: Self-pay | Admitting: Gastroenterology

## 2014-01-31 LAB — HEPATITIS A ANTIBODY, TOTAL: HEP A TOTAL AB: NONREACTIVE

## 2014-01-31 LAB — HEPATITIS B SURFACE ANTIBODY,QUALITATIVE

## 2014-02-02 LAB — HCV RNA QUANT RFLX ULTRA OR GENOTYP
HCV Quantitative Log: 6.09 {Log} — ABNORMAL HIGH (ref <1.18)
HCV Quantitative: 1221019 IU/mL — ABNORMAL HIGH (ref <15)

## 2014-02-07 LAB — HEPATITIS C GENOTYPE

## 2014-02-10 NOTE — Progress Notes (Signed)
#  1. Colonoscopy with removal of adenomatous colon polyps: recommend repeat surveillance colonoscopy in 3 years.    #2. Chronic active hepatitis C (genotype1a). No esophageal varices by EGD. Recommend referral to Dr Scharlene Gloss @ [facility] to discuss treatment. Recommend screen for HIV. Recommend hepatitis A vaccination series. Recommend quantitative hepatitis B DNA by PCR to determine if patient has active hepatitis B.

## 2014-04-02 ENCOUNTER — Encounter: Payer: Self-pay | Admitting: Internal Medicine

## 2014-04-02 ENCOUNTER — Ambulatory Visit: Payer: Medicaid Other | Attending: Internal Medicine | Admitting: Internal Medicine

## 2014-04-02 VITALS — BP 150/80 | HR 88 | Temp 98.4°F | Resp 16 | Wt 138.0 lb

## 2014-04-02 DIAGNOSIS — R7301 Impaired fasting glucose: Secondary | ICD-10-CM | POA: Diagnosis not present

## 2014-04-02 DIAGNOSIS — E559 Vitamin D deficiency, unspecified: Secondary | ICD-10-CM | POA: Insufficient documentation

## 2014-04-02 DIAGNOSIS — Z7982 Long term (current) use of aspirin: Secondary | ICD-10-CM | POA: Insufficient documentation

## 2014-04-02 DIAGNOSIS — I10 Essential (primary) hypertension: Secondary | ICD-10-CM

## 2014-04-02 DIAGNOSIS — B199 Unspecified viral hepatitis without hepatic coma: Secondary | ICD-10-CM | POA: Insufficient documentation

## 2014-04-02 DIAGNOSIS — F1010 Alcohol abuse, uncomplicated: Secondary | ICD-10-CM | POA: Insufficient documentation

## 2014-04-02 DIAGNOSIS — F17200 Nicotine dependence, unspecified, uncomplicated: Secondary | ICD-10-CM

## 2014-04-02 DIAGNOSIS — B1920 Unspecified viral hepatitis C without hepatic coma: Secondary | ICD-10-CM | POA: Diagnosis not present

## 2014-04-02 DIAGNOSIS — H269 Unspecified cataract: Secondary | ICD-10-CM | POA: Diagnosis not present

## 2014-04-02 DIAGNOSIS — D369 Benign neoplasm, unspecified site: Secondary | ICD-10-CM | POA: Insufficient documentation

## 2014-04-02 DIAGNOSIS — F1911 Other psychoactive substance abuse, in remission: Secondary | ICD-10-CM | POA: Insufficient documentation

## 2014-04-02 DIAGNOSIS — R7989 Other specified abnormal findings of blood chemistry: Secondary | ICD-10-CM

## 2014-04-02 DIAGNOSIS — R945 Abnormal results of liver function studies: Secondary | ICD-10-CM | POA: Insufficient documentation

## 2014-04-02 MED ORDER — VITAMIN D (ERGOCALCIFEROL) 1.25 MG (50000 UNIT) PO CAPS: 50000.0000 [IU] | ORAL_CAPSULE | ORAL | Status: DC

## 2014-04-02 MED ORDER — AMLODIPINE BESYLATE 2.5 MG PO TABS: 2.5000 mg | ORAL_TABLET | Freq: Every day | ORAL | Status: DC

## 2014-04-02 NOTE — Progress Notes (Signed)
MRN: [id number] Name: Calvin Gardner  Sex: male Age: 63 y.o. DOB: [birth year]  Allergies: Review of patient's allergies indicates no known allergies.  Chief Complaint  Patient presents with  . Follow-up    HPI: Patient is 63 y.o. 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 Patient is to smoke cigarettes, I have advised patient to quit smoking. Past Medical History  Diagnosis Date  . Cataract   . Hypertension     Past Surgical History  Procedure Laterality Date  . Left jaw surgery     . Mandible fracture surgery    . Colonoscopy with propofol N/A 01/30/2014    Procedure: COLONOSCOPY WITH PROPOFOL;  Surgeon: Garlan Fair, MD;  Location: [facility];  Service: Endoscopy;  Laterality: N/A;  . Esophagogastroduodenoscopy (egd) with propofol N/A 01/30/2014    Procedure: ESOPHAGOGASTRODUODENOSCOPY (EGD) WITH PROPOFOL;  Surgeon: Garlan Fair, MD;  Location: [facility];  Service: Endoscopy;  Laterality: N/A;      Medication List       This list is accurate as of: 04/02/14 11:59 AM.  Always use your most recent med list.               amLODipine 2.5 MG tablet  Commonly known as:  NORVASC  Take 1 tablet (2.5 mg total) by mouth daily.     aspirin EC 81 MG tablet  Take 324 mg by mouth every morning.     ketorolac 0.5 % ophthalmic solution  Commonly known as:  ACULAR  Place 1 drop into  both eyes 3 (three) times daily.     moxifloxacin 0.5 % ophthalmic solution  Commonly known as:  VIGAMOX  Place 1 drop into both eyes 3 (three) times daily.     prednisoLONE acetate 1 % ophthalmic suspension  Commonly known as:  PRED FORTE  Place 1 drop into both eyes 3 (three) times daily.     Vitamin D (Ergocalciferol) 50000 UNITS Caps capsule  Commonly known as:  DRISDOL  Take 1 capsule (50,000 Units total) by mouth every 7 (seven) days.        Meds ordered this encounter  Medications  . Vitamin D, Ergocalciferol, (DRISDOL) 50000 UNITS CAPS capsule    Sig: Take 1 capsule (50,000 Units total) by mouth every 7 (seven) days.    Dispense:  12 capsule    Refill:  0  . amLODipine (NORVASC) 2.5 MG tablet    Sig: Take 1 tablet (2.5 mg total) by mouth daily.    Dispense:  30 tablet    Refill:  3     There is no immunization history on file for this patient.  History reviewed. No pertinent family history.  History  Substance Use Topics  . Smoking status: Current Every Day Smoker    Types: Cigarettes  . Smokeless tobacco: Not on file  . Alcohol Use: 6.0 oz/week    10 Cans of beer per  week     Comment: quit for 10 days    Review of Systems   As noted in HPI  Filed Vitals:   04/02/14 1154  BP: 150/80  Pulse:   Temp:   Resp:     Physical Exam  Physical Exam  Constitutional: No distress.  Eyes: EOM are normal. Pupils are equal, round, and reactive to light.  Cardiovascular: Normal rate and regular rhythm.   Pulmonary/Chest: Breath sounds normal. No respiratory distress. He has no wheezes. He has no rales.  Abdominal: Soft. He exhibits no distension. There is no tenderness. There is no rebound.  Musculoskeletal: He exhibits no edema.    CBC    Component Value Date/Time   WBC 9.3 01/05/2014 1620   RBC 4.39 01/05/2014 1620   HGB 12.8* 01/05/2014 1620   HCT 38.3* 01/05/2014 1620   PLT 162 01/05/2014 1620   MCV 87.2 01/05/2014 1620   LYMPHSABS 1.9 01/05/2014 1620    MONOABS 1.1* 01/05/2014 1620   EOSABS 0.1 01/05/2014 1620   BASOSABS 0.0 01/05/2014 1620    CMP     Component Value Date/Time   NA 133* 01/05/2014 1620   K 4.3 01/05/2014 1620   CL 101 01/05/2014 1620   CO2 20 01/05/2014 1620   GLUCOSE 106* 01/05/2014 1620   BUN 13 01/05/2014 1620   CREATININE 0.78 01/05/2014 1620   CREATININE 0.70 11/02/2013 1841   CALCIUM 9.9 01/05/2014 1620   PROT 7.2 01/05/2014 1620   ALBUMIN 4.3 01/05/2014 1620   AST 58* 01/05/2014 1620   ALT 57* 01/05/2014 1620   ALKPHOS 45 01/05/2014 1620   BILITOT 0.8 01/05/2014 1620   GFRNONAA >89 01/05/2014 1620   GFRNONAA >90 11/02/2013 1841   GFRAA >89 01/05/2014 1620   GFRAA >90 11/02/2013 1841    Lab Results  Component Value Date/Time   CHOL 177 01/05/2014  4:20 PM    No components found with this basename: hga1c    Lab Results  Component Value Date/Time   AST 58* 01/05/2014  4:20 PM    Assessment and Plan  Essential hypertension, benign - Plan: Advised patient for DASH diet, resume back on amLODipine (NORVASC) 2.5 MG tablet, will come back in 2 weeks for BP check.  Smoking Advise patient to quit smoking.  IFG (impaired fasting glucose) Advised patient for low carbohydrate diet.   Unspecified vitamin D deficiency - Plan: Started patient on Vitamin D, Ergocalciferol, (DRISDOL) 50000 UNITS CAPS capsule  /Abnormal LFTsHepatitis C virus infection without hepatic coma, unspecified chronicity - Plan: Ambulatory referral to Infectious Disease   Health Maintenance -Colonoscopy: uptodate ( tubular adenoma) repeat in 3 years    Return in about 3 months (around 07/02/2014) for hypertension, IFG, BP check in 2 weeks/Nurse Visit.  Lorayne Marek, MD

## 2014-04-02 NOTE — Progress Notes (Signed)
Patient here for follow up on his HTN 

## 2014-04-02 NOTE — Patient Instructions (Addendum)
Diabetes Mellitus and Food It is important for you to manage your blood sugar (glucose) level. Your blood glucose level can be greatly affected by what you eat. Eating healthier foods in the appropriate amounts throughout the day at about the same time each day will help you control your blood glucose level. It can also help slow or prevent worsening of your diabetes mellitus. Healthy eating may even help you improve the level of your blood pressure and reach or maintain a healthy weight.  HOW CAN FOOD AFFECT ME? Carbohydrates Carbohydrates affect your blood glucose level more than any other type of food. Your dietitian will help you determine how many carbohydrates to eat at each meal and teach you how to count carbohydrates. Counting carbohydrates is important to keep your blood glucose at a healthy level, especially if you are using insulin or taking certain medicines for diabetes mellitus. Alcohol Alcohol can cause sudden decreases in blood glucose (hypoglycemia), especially if you use insulin or take certain medicines for diabetes mellitus. Hypoglycemia can be a life-threatening condition. Symptoms of hypoglycemia (sleepiness, dizziness, and disorientation) are similar to symptoms of having too much alcohol.  If your health care provider has given you approval to drink alcohol, do so in moderation and use the following guidelines:  Women should not have more than one drink per day, and men should not have more than two drinks per day. One drink is equal to:  12 oz of beer.  5 oz of wine.  1 oz of hard liquor.  Do not drink on an empty stomach.  Keep yourself hydrated. Have water, diet soda, or unsweetened iced tea.  Regular soda, juice, and other mixers might contain a lot of carbohydrates and should be counted. WHAT FOODS ARE NOT RECOMMENDED? As you make food choices, it is important to remember that all foods are not the same. Some foods have fewer nutrients per serving than other  foods, even though they might have the same number of calories or carbohydrates. It is difficult to get your body what it needs when you eat foods with fewer nutrients. Examples of foods that you should avoid that are high in calories and carbohydrates but low in nutrients include:  Trans fats (most processed foods list trans fats on the Nutrition Facts label).  Regular soda.  Juice.  Candy.  Sweets, such as cake, pie, doughnuts, and cookies.  Fried foods. WHAT FOODS CAN I EAT? Have nutrient-rich foods, which will nourish your body and keep you healthy. The food you should eat also will depend on several factors, including:  The calories you need.  The medicines you take.  Your weight.  Your blood glucose level.  Your blood pressure level.  Your cholesterol level. You also should eat a variety of foods, including:  Protein, such as meat, poultry, fish, tofu, nuts, and seeds (lean animal proteins are best).  Fruits.  Vegetables.  Dairy products, such as milk, cheese, and yogurt (low fat is best).  Breads, grains, pasta, cereal, rice, and beans.  Fats such as olive oil, trans fat-free margarine, canola oil, avocado, and olives. DOES EVERYONE WITH DIABETES MELLITUS HAVE THE SAME MEAL PLAN? Because every person with diabetes mellitus is different, there is not one meal plan that works for everyone. It is very important that you meet with a dietitian who will help you create a meal plan that is just right for you. Document Released: 04/16/2005 Document Revised: 07/25/2013 Document Reviewed: 06/16/2013 Exit[employer] Patient Information 2015 [employer]. This   information is not intended to replace advice given to you by your health care provider. Make sure you discuss any questions you have with your health care provider. DASH Eating Plan DASH stands for "Dietary Approaches to Stop Hypertension." The DASH eating plan is a healthy eating plan that has been shown to reduce high  blood pressure (hypertension). Additional health benefits may include reducing the risk of type 2 diabetes mellitus, heart disease, and stroke. The DASH eating plan may also help with weight loss. WHAT DO I NEED TO KNOW ABOUT THE DASH EATING PLAN? For the DASH eating plan, you will follow these general guidelines:  Choose foods with a percent daily value for sodium of less than 5% (as listed on the food label).  Use salt-free seasonings or herbs instead of table salt or sea salt.  Check with your health care provider or pharmacist before using salt substitutes.  Eat lower-sodium products, often labeled as "lower sodium" or "no salt added."  Eat fresh foods.  Eat more vegetables, fruits, and low-fat dairy products.  Choose whole grains. Look for the word "whole" as the first word in the ingredient list.  Choose fish and skinless chicken or turkey more often than red meat. Limit fish, poultry, and meat to 6 oz (170 g) each day.  Limit sweets, desserts, sugars, and sugary drinks.  Choose heart-healthy fats.  Limit cheese to 1 oz (28 g) per day.  Eat more home-cooked food and less restaurant, buffet, and fast food.  Limit fried foods.  Cook foods using methods other than frying.  Limit canned vegetables. If you do use them, rinse them well to decrease the sodium.  When eating at a restaurant, ask that your food be prepared with less salt, or no salt if possible. WHAT FOODS CAN I EAT? Seek help from a dietitian for individual calorie needs. Grains Whole grain or whole wheat bread. Brown rice. Whole grain or whole wheat pasta. Quinoa, bulgur, and whole grain cereals. Low-sodium cereals. Corn or whole wheat flour tortillas. Whole grain cornbread. Whole grain crackers. Low-sodium crackers. Vegetables Fresh or frozen vegetables (raw, steamed, roasted, or grilled). Low-sodium or reduced-sodium tomato and vegetable juices. Low-sodium or reduced-sodium tomato sauce and paste. Low-sodium  or reduced-sodium canned vegetables.  Fruits All fresh, canned (in natural juice), or frozen fruits. Meat and Other Protein Products Ground beef (85% or leaner), grass-fed beef, or beef trimmed of fat. Skinless chicken or turkey. Ground chicken or turkey. Pork trimmed of fat. All fish and seafood. Eggs. Dried beans, peas, or lentils. Unsalted nuts and seeds. Unsalted canned beans. Dairy Low-fat dairy products, such as skim or 1% milk, 2% or reduced-fat cheeses, low-fat ricotta or cottage cheese, or plain low-fat yogurt. Low-sodium or reduced-sodium cheeses. Fats and Oils Tub margarines without trans fats. Light or reduced-fat mayonnaise and salad dressings (reduced sodium). Avocado. Safflower, olive, or canola oils. Natural peanut or almond butter. Other Unsalted popcorn and pretzels. The items listed above may not be a complete list of recommended foods or beverages. Contact your dietitian for more options. WHAT FOODS ARE NOT RECOMMENDED? Grains White bread. White pasta. White rice. Refined cornbread. Bagels and croissants. Crackers that contain trans fat. Vegetables Creamed or fried vegetables. Vegetables in a cheese sauce. Regular canned vegetables. Regular canned tomato sauce and paste. Regular tomato and vegetable juices. Fruits Dried fruits. Canned fruit in light or heavy syrup. Fruit juice. Meat and Other Protein Products Fatty cuts of meat. Ribs, chicken wings, bacon, sausage, bologna, salami, chitterlings, fatback, hot   dogs, bratwurst, and packaged luncheon meats. Salted nuts and seeds. Canned beans with salt. Dairy Whole or 2% milk, cream, half-and-half, and cream cheese. Whole-fat or sweetened yogurt. Full-fat cheeses or blue cheese. Nondairy creamers and whipped toppings. Processed cheese, cheese spreads, or cheese curds. Condiments Onion and garlic salt, seasoned salt, table salt, and sea salt. Canned and packaged gravies. Worcestershire sauce. Tartar sauce. Barbecue sauce.  Teriyaki sauce. Soy sauce, including reduced sodium. Steak sauce. Fish sauce. Oyster sauce. Cocktail sauce. Horseradish. Ketchup and mustard. Meat flavorings and tenderizers. Bouillon cubes. Hot sauce. Tabasco sauce. Marinades. Taco seasonings. Relishes. Fats and Oils Butter, stick margarine, lard, shortening, ghee, and bacon fat. Coconut, palm kernel, or palm oils. Regular salad dressings. Other Pickles and olives. Salted popcorn and pretzels. The items listed above may not be a complete list of foods and beverages to avoid. Contact your dietitian for more information. WHERE CAN I FIND MORE INFORMATION? National [employer], [employer], [employer]: www.nhlbi.nih.gov/health/health-topics/topics/dash/ Document Released: 07/09/2011 Document Revised: 12/04/2013 Document Reviewed: 05/24/2013 ExitCare Patient Information 2015 [employer]. This information is not intended to replace advice given to you by your health care provider. Make sure you discuss any questions you have with your health care provider.  

## 2014-04-18 ENCOUNTER — Other Ambulatory Visit: Payer: Medicaid Other

## 2014-04-26 ENCOUNTER — Ambulatory Visit: Payer: Medicaid Other | Attending: Internal Medicine

## 2014-04-26 VITALS — BP 149/84 | HR 72 | Resp 16

## 2014-04-26 DIAGNOSIS — I10 Essential (primary) hypertension: Secondary | ICD-10-CM

## 2014-04-26 MED ORDER — AMLODIPINE BESYLATE 5 MG PO TABS: 5.0000 mg | ORAL_TABLET | Freq: Every day | ORAL | Status: DC

## 2014-04-26 NOTE — Patient Instructions (Signed)
Take 2 tablets of blood pressure medication until prescription completed then pick up new dose 5 mg tab when done Return in 2 weeks for nurse visit

## 2014-04-26 NOTE — Progress Notes (Unsigned)
Patient ID: Calvin Gardner, male   DOB: 07-Oct-1950, 63 y.o.   MRN: [id number] Pt here for blood pressure recheck s/p elevated BP onlast visit 04/02/14 Pt was started on Amlodipine 2.5 mg with return nurse visit in 2 weeks Pt states he is compliant with taking medication daily BP- 149/84 72 with slight headache Flu vaccine given

## 2014-05-10 ENCOUNTER — Telehealth: Payer: Self-pay | Admitting: *Deleted

## 2014-05-10 ENCOUNTER — Ambulatory Visit: Payer: Medicaid Other | Attending: Internal Medicine

## 2014-05-10 NOTE — Patient Instructions (Signed)
Pt in our clinic to recheck BP Complaining of Head aches after taking medication BP 183/94 Pt stated walked more then 78min to get here Recheck BP manually after 15 min resting BP 152/92 PCP recommended to take Amlodipine 2.5mg  in the AM an 2.5PM Recheck BP in 1 week, call if Head aches continues

## 2014-05-10 NOTE — Progress Notes (Unsigned)
Subjective:     Patient ID: Calvin Gardner, male   DOB: 1951/02/27, 63 y.o.   MRN: [id number]  HPI   Review of Systems     Objective:   Physical Exam     Assessment:     ***    Plan:     ***

## 2014-05-17 ENCOUNTER — Telehealth: Payer: Self-pay | Admitting: *Deleted

## 2014-05-17 ENCOUNTER — Ambulatory Visit: Payer: Medicaid Other | Attending: Internal Medicine

## 2014-05-17 NOTE — Telephone Encounter (Signed)
Pt here for BP Check

## 2014-05-17 NOTE — Progress Notes (Unsigned)
Pt here for a nurse visit recheck BP Stated taking medication as PCP prescribe  BP 159/82 Reck BP 146/84

## 2014-06-01 ENCOUNTER — Other Ambulatory Visit: Payer: Self-pay | Admitting: Internal Medicine

## 2014-06-20 ENCOUNTER — Other Ambulatory Visit: Payer: Self-pay | Admitting: Internal Medicine

## 2014-06-22 ENCOUNTER — Other Ambulatory Visit: Payer: Self-pay | Admitting: Internal Medicine

## 2014-07-06 ENCOUNTER — Encounter: Payer: Self-pay | Admitting: Internal Medicine

## 2014-07-06 ENCOUNTER — Ambulatory Visit: Payer: Medicaid Other | Attending: Internal Medicine | Admitting: Internal Medicine

## 2014-07-06 VITALS — BP 140/80 | HR 93 | Temp 98.4°F | Resp 16 | Wt 142.2 lb

## 2014-07-06 DIAGNOSIS — Z7982 Long term (current) use of aspirin: Secondary | ICD-10-CM | POA: Diagnosis not present

## 2014-07-06 DIAGNOSIS — F17200 Nicotine dependence, unspecified, uncomplicated: Secondary | ICD-10-CM

## 2014-07-06 DIAGNOSIS — R7301 Impaired fasting glucose: Secondary | ICD-10-CM | POA: Insufficient documentation

## 2014-07-06 DIAGNOSIS — Z720 Tobacco use: Secondary | ICD-10-CM

## 2014-07-06 DIAGNOSIS — Z791 Long term (current) use of non-steroidal anti-inflammatories (NSAID): Secondary | ICD-10-CM | POA: Diagnosis not present

## 2014-07-06 DIAGNOSIS — B1920 Unspecified viral hepatitis C without hepatic coma: Secondary | ICD-10-CM | POA: Insufficient documentation

## 2014-07-06 DIAGNOSIS — F17210 Nicotine dependence, cigarettes, uncomplicated: Secondary | ICD-10-CM | POA: Insufficient documentation

## 2014-07-06 DIAGNOSIS — I10 Essential (primary) hypertension: Secondary | ICD-10-CM | POA: Diagnosis not present

## 2014-07-06 DIAGNOSIS — Z23 Encounter for immunization: Secondary | ICD-10-CM | POA: Diagnosis not present

## 2014-07-06 MED ORDER — AMLODIPINE BESYLATE 5 MG PO TABS: 5.0000 mg | ORAL_TABLET | Freq: Every day | ORAL | Status: DC

## 2014-07-06 NOTE — Progress Notes (Signed)
MRN: [id number] Name: Calvin Gardner  Sex: male Age: 63 y.o. DOB: 01/10/1951  Allergies: Review of patient's allergies indicates no known allergies.  Chief Complaint  Patient presents with  . Follow-up    HTN    HPI: Patient is 63 y.o. male who has to of hypertension, impaired fasting glucose, history of hepatitis C comes today for followup as per patient he then out of his blood pressure medication currently taking Norvasc 5 mg daily, initially his blood pressure was elevated, repeat manual blood pressure is 140/80, patient denies any acute symptoms denies any headache dizziness chest and shortness of breath, he was referred to ID in the past for hepatitis C  treatment but as per patient he has not been able to set up appointment.  Past Medical History  Diagnosis Date  . Cataract   . Hypertension     Past Surgical History  Procedure Laterality Date  . Left jaw surgery     . Mandible fracture surgery    . Colonoscopy with propofol N/A 01/30/2014    Procedure: COLONOSCOPY WITH PROPOFOL;  Surgeon: Garlan Fair, MD;  Location: [facility];  Service: Endoscopy;  Laterality: N/A;  . Esophagogastroduodenoscopy (egd) with propofol N/A 01/30/2014    Procedure: ESOPHAGOGASTRODUODENOSCOPY (EGD) WITH PROPOFOL;  Surgeon: Garlan Fair, MD;  Location: [facility];  Service: Endoscopy;  Laterality: N/A;      Medication List       This list is accurate as of: 07/06/14 12:08 PM.  Always use your most recent med list.               amLODipine 5 MG tablet  Commonly known as:  NORVASC  Take 1 tablet (5 mg total) by mouth daily.     aspirin EC 81 MG tablet  Take 324 mg by mouth every morning.     ketorolac 0.5 % ophthalmic solution  Commonly known as:  ACULAR  Place 1 drop into both eyes 3 (three) times daily.     moxifloxacin 0.5 % ophthalmic solution  Commonly known as:  VIGAMOX  Place 1 drop into both eyes 3 (three) times daily.     prednisoLONE acetate 1 %  ophthalmic suspension  Commonly known as:  PRED FORTE  Place 1 drop into both eyes 3 (three) times daily.     Vitamin D (Ergocalciferol) 50000 UNITS Caps capsule  Commonly known as:  DRISDOL  Take 1 capsule (50,000 Units total) by mouth every 7 (seven) days.        Meds ordered this encounter  Medications  . amLODipine (NORVASC) 5 MG tablet    Sig: Take 1 tablet (5 mg total) by mouth daily.    Dispense:  30 tablet    Refill:  3     There is no immunization history on file for this patient.  History reviewed. No pertinent family history.  History  Substance Use Topics  . Smoking status: Current Every Day Smoker    Types: Cigarettes  . Smokeless tobacco: Not on file  . Alcohol Use: 6.0 oz/week    10 Cans of beer per week     Comment: quit for 10 days    Review of Systems   As noted in HPI  Filed Vitals:   07/06/14 1204  BP: 140/80  Pulse:   Temp:   Resp:     Physical Exam  Physical Exam  Constitutional: No distress.  Eyes: EOM are normal. Pupils are equal, round, and reactive to  light.  Neck: Neck supple.  Cardiovascular: Normal rate and regular rhythm.   Pulmonary/Chest: Breath sounds normal. No respiratory distress. He has no wheezes. He has no rales.  Musculoskeletal: He exhibits no edema.    CBC    Component Value Date/Time   WBC 9.3 01/05/2014 1620   RBC 4.39 01/05/2014 1620   HGB 12.8* 01/05/2014 1620   HCT 38.3* 01/05/2014 1620   PLT 162 01/05/2014 1620   MCV 87.2 01/05/2014 1620   LYMPHSABS 1.9 01/05/2014 1620   MONOABS 1.1* 01/05/2014 1620   EOSABS 0.1 01/05/2014 1620   BASOSABS 0.0 01/05/2014 1620    CMP     Component Value Date/Time   NA 133* 01/05/2014 1620   K 4.3 01/05/2014 1620   CL 101 01/05/2014 1620   CO2 20 01/05/2014 1620   GLUCOSE 106* 01/05/2014 1620   BUN 13 01/05/2014 1620   CREATININE 0.78 01/05/2014 1620   CREATININE 0.70 11/02/2013 1841   CALCIUM 9.9 01/05/2014 1620   PROT 7.2 01/05/2014 1620   ALBUMIN 4.3  01/05/2014 1620   AST 58* 01/05/2014 1620   ALT 57* 01/05/2014 1620   ALKPHOS 45 01/05/2014 1620   BILITOT 0.8 01/05/2014 1620   GFRNONAA >89 01/05/2014 1620   GFRNONAA >90 11/02/2013 1841   GFRAA >89 01/05/2014 1620   GFRAA >90 11/02/2013 1841    Lab Results  Component Value Date/Time   CHOL 177 01/05/2014 04:20 PM    No components found for: HGA1C  Lab Results  Component Value Date/Time   AST 58* 01/05/2014 04:20 PM    Assessment and Plan  Essential hypertension, benign - Plan: advised patient for DASH diet, resume back on amLODipine (NORVASC) 5 MG tablet, COMPLETE METABOLIC PANEL WITH GFR  Smoking Advised patient to quit smoking  IFG (impaired fasting glucose) - Plan:Patient for low carbohydrate diet. Will repeat COMPLETE METABOLIC PANEL WITH GFR  Hepatitis C virus infection without hepatic coma, unspecified chronicity - Plan: Ambulatory referral to Infectious Disease, COMPLETE METABOLIC PANEL WITH GFR  Need for prophylactic vaccination against Streptococcus pneumoniae (pneumococcus) Pneumovax given today.   Health Maintenance -Colonoscopy: uptodate   -Vaccinations:  -uptodate with flu shot, pneumovax today   Return in about 3 months (around 10/05/2014) for hypertension.  Lorayne Marek, MD

## 2014-07-06 NOTE — Progress Notes (Signed)
Patient here for follow up on his HTN Has been out of his medication for the past three days

## 2014-07-06 NOTE — Patient Instructions (Signed)
DASH Eating Plan °DASH stands for "Dietary Approaches to Stop Hypertension." The DASH eating plan is a healthy eating plan that has been shown to reduce high blood pressure (hypertension). Additional health benefits may include reducing the risk of type 2 diabetes mellitus, heart disease, and stroke. The DASH eating plan may also help with weight loss. °WHAT DO I NEED TO KNOW ABOUT THE DASH EATING PLAN? °For the DASH eating plan, you will follow these general guidelines: °· Choose foods with a percent daily value for sodium of less than 5% (as listed on the food label). °· Use salt-free seasonings or herbs instead of table salt or sea salt. °· Check with your health care provider or pharmacist before using salt substitutes. °· Eat lower-sodium products, often labeled as "lower sodium" or "no salt added." °· Eat fresh foods. °· Eat more vegetables, fruits, and low-fat dairy products. °· Choose whole grains. Look for the word "whole" as the first word in the ingredient list. °· Choose fish and skinless chicken or turkey more often than red meat. Limit fish, poultry, and meat to 6 oz (170 g) each day. °· Limit sweets, desserts, sugars, and sugary drinks. °· Choose heart-healthy fats. °· Limit cheese to 1 oz (28 g) per day. °· Eat more home-cooked food and less restaurant, buffet, and fast food. °· Limit fried foods. °· Cook foods using methods other than frying. °· Limit canned vegetables. If you do use them, rinse them well to decrease the sodium. °· When eating at a restaurant, ask that your food be prepared with less salt, or no salt if possible. °WHAT FOODS CAN I EAT? °Seek help from a dietitian for individual calorie needs. °Grains °Whole grain or whole wheat bread. Brown rice. Whole grain or whole wheat pasta. Quinoa, bulgur, and whole grain cereals. Low-sodium cereals. Corn or whole wheat flour tortillas. Whole grain cornbread. Whole grain crackers. Low-sodium crackers. °Vegetables °Fresh or frozen vegetables  (raw, steamed, roasted, or grilled). Low-sodium or reduced-sodium tomato and vegetable juices. Low-sodium or reduced-sodium tomato sauce and paste. Low-sodium or reduced-sodium canned vegetables.  °Fruits °All fresh, canned (in natural juice), or frozen fruits. °Meat and Other Protein Products °Ground beef (85% or leaner), grass-fed beef, or beef trimmed of fat. Skinless chicken or turkey. Ground chicken or turkey. Pork trimmed of fat. All fish and seafood. Eggs. Dried beans, peas, or lentils. Unsalted nuts and seeds. Unsalted canned beans. °Dairy °Low-fat dairy products, such as skim or 1% milk, 2% or reduced-fat cheeses, low-fat ricotta or cottage cheese, or plain low-fat yogurt. Low-sodium or reduced-sodium cheeses. °Fats and Oils °Tub margarines without trans fats. Light or reduced-fat mayonnaise and salad dressings (reduced sodium). Avocado. Safflower, olive, or canola oils. Natural peanut or almond butter. °Other °Unsalted popcorn and pretzels. °The items listed above may not be a complete list of recommended foods or beverages. Contact your dietitian for more options. °WHAT FOODS ARE NOT RECOMMENDED? °Grains °White bread. White pasta. White rice. Refined cornbread. Bagels and croissants. Crackers that contain trans fat. °Vegetables °Creamed or fried vegetables. Vegetables in a cheese sauce. Regular canned vegetables. Regular canned tomato sauce and paste. Regular tomato and vegetable juices. °Fruits °Dried fruits. Canned fruit in light or heavy syrup. Fruit juice. °Meat and Other Protein Products °Fatty cuts of meat. Ribs, chicken wings, bacon, sausage, bologna, salami, chitterlings, fatback, hot dogs, bratwurst, and packaged luncheon meats. Salted nuts and seeds. Canned beans with salt. °Dairy °Whole or 2% milk, cream, half-and-half, and cream cheese. Whole-fat or sweetened yogurt. Full-fat   cheeses or blue cheese. Nondairy creamers and whipped toppings. Processed cheese, cheese spreads, or cheese  curds. °Condiments °Onion and garlic salt, seasoned salt, table salt, and sea salt. Canned and packaged gravies. Worcestershire sauce. Tartar sauce. Barbecue sauce. Teriyaki sauce. Soy sauce, including reduced sodium. Steak sauce. Fish sauce. Oyster sauce. Cocktail sauce. Horseradish. Ketchup and mustard. Meat flavorings and tenderizers. Bouillon cubes. Hot sauce. Tabasco sauce. Marinades. Taco seasonings. Relishes. °Fats and Oils °Butter, stick margarine, lard, shortening, ghee, and bacon fat. Coconut, palm kernel, or palm oils. Regular salad dressings. °Other °Pickles and olives. Salted popcorn and pretzels. °The items listed above may not be a complete list of foods and beverages to avoid. Contact your dietitian for more information. °WHERE CAN I FIND MORE INFORMATION? °National Heart, Lung, and Blood Institute: www.nhlbi.nih.gov/health/health-topics/topics/dash/ °Document Released: 07/09/2011 Document Revised: 12/04/2013 Document Reviewed: 05/24/2013 °ExitCare® Patient Information ©2015 [employer]. This information is not intended to replace advice given to you by your health care provider. Make sure you discuss any questions you have with your health care provider. ° °

## 2014-07-07 LAB — COMPLETE METABOLIC PANEL WITH GFR
ALBUMIN: 4.4 g/dL (ref 3.5–5.2)
ALK PHOS: 48 U/L (ref 39–117)
ALT: 53 U/L (ref 0–53)
AST: 96 U/L — ABNORMAL HIGH (ref 0–37)
BILIRUBIN TOTAL: 0.4 mg/dL (ref 0.2–1.2)
BUN: 10 mg/dL (ref 6–23)
CO2: 26 meq/L (ref 19–32)
Calcium: 9.8 mg/dL (ref 8.4–10.5)
Chloride: 101 mEq/L (ref 96–112)
Creat: 0.8 mg/dL (ref 0.50–1.35)
GFR, Est African American: >89 mL/min
GFR, Est Non African American: >89 mL/min
Glucose, Bld: 60 mg/dL — ABNORMAL LOW (ref 70–99)
POTASSIUM: 4.6 meq/L (ref 3.5–5.3)
Sodium: 135 mEq/L (ref 135–145)
TOTAL PROTEIN: 7.7 g/dL (ref 6.0–8.3)

## 2014-07-31 ENCOUNTER — Other Ambulatory Visit (INDEPENDENT_AMBULATORY_CARE_PROVIDER_SITE_OTHER): Payer: Medicaid Other

## 2014-07-31 DIAGNOSIS — B182 Chronic viral hepatitis C: Secondary | ICD-10-CM

## 2014-07-31 LAB — PROTIME-INR
INR: 1.02 (ref <1.50)
Prothrombin Time: 13.4 seconds (ref 11.6–15.2)

## 2014-07-31 LAB — HEPATITIS B SURFACE ANTIGEN: Hepatitis B Surface Ag: NEGATIVE

## 2014-07-31 LAB — HIV ANTIBODY (ROUTINE TESTING W REFLEX): HIV 1&2 Ab, 4th Generation: NONREACTIVE

## 2014-07-31 LAB — IRON: Iron: 228 ug/dL — ABNORMAL HIGH (ref 42–165)

## 2014-07-31 LAB — HEPATITIS B CORE ANTIBODY, TOTAL: HEP B C TOTAL AB: REACTIVE — AB

## 2014-07-31 NOTE — Addendum Note (Signed)
Addended by: Dolan Amen D on: 07/31/2014 10:30 AM   Modules accepted: Orders

## 2014-08-01 LAB — ANA: Anti Nuclear Antibody(ANA): NEGATIVE

## 2014-09-11 ENCOUNTER — Other Ambulatory Visit: Payer: Self-pay | Admitting: Internal Medicine

## 2014-09-13 ENCOUNTER — Other Ambulatory Visit: Payer: Self-pay | Admitting: Internal Medicine

## 2014-09-17 ENCOUNTER — Other Ambulatory Visit: Payer: Self-pay | Admitting: Internal Medicine

## 2014-09-19 ENCOUNTER — Ambulatory Visit (INDEPENDENT_AMBULATORY_CARE_PROVIDER_SITE_OTHER): Payer: Medicaid Other | Admitting: Internal Medicine

## 2014-09-19 ENCOUNTER — Encounter: Payer: Self-pay | Admitting: Internal Medicine

## 2014-09-19 VITALS — BP 174/104 | HR 101 | Temp 98.8°F | Ht 66.0 in | Wt 137.0 lb

## 2014-09-19 DIAGNOSIS — B182 Chronic viral hepatitis C: Secondary | ICD-10-CM

## 2014-09-19 DIAGNOSIS — Z23 Encounter for immunization: Secondary | ICD-10-CM | POA: Diagnosis present

## 2014-09-19 HISTORY — DX: Chronic viral hepatitis C: B18.2

## 2014-09-19 NOTE — Patient Instructions (Signed)
Date 09/19/2014  Dear Mr. Frenkel, As discussed in the [facility], your hepatitis C therapy will include the following medications:          Harvoni 90mg /400mg  tablet:           Take 1 tablet by mouth once daily   Please note that ALL MEDICATIONS WILL START ON THE SAME DATE for a total of 12 weeks. ---------------------------------------------------------------- Your HCV Treatment Start Date: TBA   Your HCV genotype:  1a    Liver Fibrosis: TBD    ---------------------------------------------------------------- YOUR PHARMACY CONTACT:   [place] [facility] [place] Phone: [phone] Hours: Monday to Friday 7:30 am to 6:00 pm   Please always contact your pharmacy at least 3-4 business days before you run out of medications to ensure your next month's medication is ready or 1 week prior to running out if you receive it by mail.  Remember, each prescription is for 28 days. ---------------------------------------------------------------- GENERAL NOTES REGARDING YOUR HEPATITIS C MEDICATION:  SOFOSBUVIR/LEDIPASVIR (HARVONI): - Harvoni tablet is taken daily with OR without food. - The tablets are orange. - The tablets should be stored at room temperature.  - Acid reducing agents such as H2 blockers (ie. Pepcid (famotidine), Zantac (ranitidine), Tagamet (cimetidine), Axid (nizatidine) and proton pump inhibitors (ie. Prilosec (omeprazole), Protonix (pantoprazole), Nexium (esomeprazole), or Aciphex (rabeprazole)) can decrease effectiveness of Harvoni. Do not take until you have discussed with a health care provider.    -Antacids that contain magnesium and/or aluminum hydroxide (ie. Milk of Magensia, Rolaids, Gaviscon, Maalox, Mylanta, an dArthritis Pain Formula)can reduce absorption of Harvoni, so take them at least 4 hours before or after Harvoni.  -Calcium carbonate (calcium supplements or antacids such as Tums, Caltrate,  Os-Cal)needs to be taken at least 4 hours hours before or after Harvoni.  -St. John's wort or any products that contain St. John's wort like some herbal supplements  Please inform the office prior to starting any of these medications.  - The common side effects with Harvoni:      1. Fatigue      2. Headache      3. Nausea      4. Diarrhea      5. Insomnia   Support Path is a suite of resources designed to help patients start with HARVONI and move toward treatment completion Irene helps patients access therapy and get off to an efficient start  Benefits investigation and prior authorization support Co-pay and other financial assistance A specialty pharmacy finder CO-PAY COUPON The [place] co-pay coupon may help eligible patients lower their out-of-pocket costs. With a co-pay coupon, most eligible patients may pay no more than $5 per co-pay (restrictions apply) www.harvoni.com call [phone] Not valid for patients enrolled in government healthcare prescription drug programs, such as Medicare Part D and Medicaid. Patients in the coverage gap known as the "donut hole" also are not eligible The HARVONI co-pay coupon program will cover the out-of-pocket costs for HARVONI prescriptions up to a maximum of 25% of the catalog price of a 12-week regimen of HARVONI  Please note that this only lists the most common side effects and is NOT a comprehensive list of the potential side effects of these medications. For more information, please review the drug information sheets that come with your medication package from the pharmacy.  ---------------------------------------------------------------- GENERAL HELPFUL HINTS ON HCV THERAPY: 1. No alcohol. 2. Protect against sun-sensitivity/sunburns (wear sunglasses, hat, long sleeves, pants  and sunscreen). 3. Stay well-hydrated/well-moisturized. 4. Notify the [facility] of any changes in your other over-the-counter/herbal or  prescription medications. 5. If you miss a dose of your medication, take the missed dose as soon as you remember. Return to your regular time/dose schedule the next day.  6.  Do not stop taking your medications without first talking with your healthcare provider. 7.  You may take Tylenol (acetaminophen), as long as the dose is less than 2000 mg (OR no more than 4 tablets of the Tylenol Extra Strengths 500mg  tablet) in 24 hours. 8.  You will need to obtain routine labs and/or office visits at [facility] at weeks 2, 4, 8,  and 12 as well as 12 and 24 weeks after completion of treatment.   Scharlene Gloss, [place] for [address] [place] [place], [place]  [id number] [phone]

## 2014-09-19 NOTE — Addendum Note (Signed)
Addended by: Myrtis Hopping A on: 09/19/2014 03:52 PM   Modules accepted: Orders

## 2014-09-19 NOTE — Progress Notes (Signed)
+Calvin Gardner is a 64 y.o. male who presents for initial evaluation and management of a positive Hepatitis C antibody test.  Patient tested positive earlier last year. 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 Patient denies history of blood transfusion, intranasal drug use, multiple sexual partners, renal dialysis, sexual contact with person with liver disease, tattoos. Patient has had other studies performed. Results: hepatitis C RNA by PCR, result: positive. Patient has not had prior treatment for Hepatitis C. Patient does not have a past history of liver disease. Patient does not have a family history of liver disease.   HPI: He has been seen by gastroenterology in July for colonoscopy and did have an EGD at that time which was negative for varices. He does drink alcohol occasionally.  Patient does not have documented immunity to Hepatitis A. Patient does have documented immunity to Hepatitis B.     Review of Systems A comprehensive review of systems was negative.   Past Medical History  Diagnosis Date  . Cataract   . Hypertension     Prior to Admission medications   Medication Sig Start Date End Date Taking? Authorizing Provider  amLODipine (NORVASC) 2.5 MG tablet TAKE ONE TABLET BY MOUTH EVERY DAY 09/14/14   Lorayne Marek, MD  amLODipine (NORVASC) 5 MG tablet Take 1 tablet (5 mg total) by mouth daily. 07/06/14   Lorayne Marek, MD  aspirin EC 81 MG tablet Take 324 mg by mouth every morning.    Historical Provider, MD  ketorolac (ACULAR) 0.5 % ophthalmic solution Place 1 drop into both eyes 3 (three) times daily.    Historical Provider, MD  moxifloxacin (VIGAMOX) 0.5 % ophthalmic solution Place 1 drop into both eyes 3 (three) times daily.    Historical Provider, MD  prednisoLONE acetate (PRED FORTE) 1 % ophthalmic suspension Place 1 drop into both eyes 3 (three) times daily.    Historical Provider, MD  Vitamin D, Ergocalciferol,  (DRISDOL) 50000 UNITS CAPS capsule Take 1 capsule (50,000 Units total) by mouth every 7 (seven) days. 04/02/14   Lorayne Marek, MD    No Known Allergies  History  Substance Use Topics  . Smoking status: Current Some Day Smoker    Types: Cigarettes  . Smokeless tobacco: Never Used     Comment: trying to quit  . Alcohol Use: 6.0 oz/week    10 Cans of beer per week    No family history on file.    Objective:   Filed Vitals:   09/19/14 1338  BP: 174/104  Pulse: 101  Temp: 98.8 F (37.1 C)   in no apparent distress and alert HEENT: anicteric Cor RRR and No murmurs clear Bowel sounds are normal, liver is not enlarged, spleen is not enlarged peripheral pulses normal, no pedal edema, no clubbing or cyanosis negative for - jaundice, spider hemangioma, telangiectasia, palmar erythema, ecchymosis and atrophy  Laboratory Genotype:  Lab Results  Component Value Date   HCVGENOTYPE 1a 01/30/2014   HCV viral load:  Lab Results  Component Value Date   HCVQUANT 1221019* 01/30/2014   Lab Results  Component Value Date   WBC 9.3 01/05/2014   HGB 12.8* 01/05/2014   HCT 38.3* 01/05/2014   MCV 87.2 01/05/2014   PLT 162 01/05/2014    Lab Results  Component Value Date   CREATININE 0.80 07/06/2014   BUN 10 07/06/2014   NA 135 07/06/2014   K 4.6 07/06/2014   CL 101  07/06/2014   CO2 26 07/06/2014    Lab Results  Component Value Date   ALT 53 07/06/2014   AST 96* 07/06/2014   ALKPHOS 48 07/06/2014   BILITOT 0.4 07/06/2014   INR 1.02 07/31/2014      Assessment: Chronic Hepatitis C genotype 1 A  Plan: 1) Patient counseled extensively on limiting acetaminophen to no more than 2 grams daily, avoidance of alcohol. 2) Transmission discussed with patient including sexual transmission, sharing razors and toothbrush.   3) Will need referral to gastroenterology if concern for cirrhosis 4) Will need referral for substance abuse counseling: Yes.  for alcohol use.  Will need to be  in counseling in order to sign medicaid readiness form.  Once in counseling, we can persue treatment.  5) Will prescribe Harvoni for 12 weeks once work up complete if he has cirrhosis, rather than 24 weeks of [employer].  6) Hepatitis A vaccine Yes.   7) Hepatitis B vaccine No. 8) Pneumovax vaccine if concern for cirrhosis 9) will follow up after counseling, elastography in order to sign readiness form.

## 2014-09-20 ENCOUNTER — Other Ambulatory Visit: Payer: Self-pay | Admitting: Internal Medicine

## 2014-09-20 LAB — HCV RNA QUANT RFLX ULTRA OR GENOTYP
HCV QUANT LOG: 6.73 {Log} — AB (ref <1.18)
HCV Quantitative: 5377625 IU/mL — ABNORMAL HIGH (ref <15)

## 2014-09-20 LAB — HEPATITIS B DNA, ULTRAQUANTITATIVE, PCR: HEPATITIS B DNA: NOT DETECTED [IU]/mL (ref <20)

## 2014-09-24 ENCOUNTER — Other Ambulatory Visit: Payer: Self-pay | Admitting: Internal Medicine

## 2014-09-25 ENCOUNTER — Ambulatory Visit: Payer: Medicaid Other | Admitting: *Deleted

## 2014-09-25 DIAGNOSIS — F1010 Alcohol abuse, uncomplicated: Secondary | ICD-10-CM

## 2014-09-25 LAB — HEPATITIS C GENOTYPE

## 2014-09-25 NOTE — BH Specialist Note (Signed)
Calvin Gardner was present for his scheduled appointment today.  Patient was oriented times four with good affect and dress. Patient was alert and talkative with counselor.   Patient communicated that he had recently been diagnosed with Hepatitis C.  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  Patient reports no substance use of any kind presently.  Patient stated that he does however drink about 3 40's a day.  Counselor educated patient on the fact that one 40 equals about 3 drinks and so 3 40's would equal about 9 drinks a day.  Counselor explained to patient that drinking that much while having Hep C was not such a good idea.  Counselor educated patient on Hep C and the risk factors associated with that disease especially as it relates to drinking alcohol.  Patient stated that he does not have the best support right now from family and he doesn't have many friends to lean on.  Patient shared that he just spends time with his girlfriend who is wheelchair bound and her niece. Patient states that he tends to drink every day because he gets so bored that he does not have anything else to do.  Counselor encouraged patient to seek activities and outside friends he could spend time with that do not use and provide his some socialization outside his home.  Counselor suggested to patient that he meet on a regular basis to work on full recovery.  Patient agreed and together an additional appointment was made.  Rolena Infante, LPCA, MA [facility]

## 2014-09-26 ENCOUNTER — Other Ambulatory Visit: Payer: Self-pay | Admitting: Internal Medicine

## 2014-10-03 ENCOUNTER — Other Ambulatory Visit: Payer: Self-pay | Admitting: Internal Medicine

## 2014-10-05 ENCOUNTER — Ambulatory Visit (HOSPITAL_COMMUNITY)
Admission: RE | Admit: 2014-10-05 | Discharge: 2014-10-05 | Disposition: A | Discharge status: Home / Self Care | Payer: Medicaid Other | Source: Ambulatory Visit | Attending: Internal Medicine | Admitting: Internal Medicine

## 2014-10-05 DIAGNOSIS — B182 Chronic viral hepatitis C: Secondary | ICD-10-CM | POA: Diagnosis not present

## 2014-10-09 ENCOUNTER — Ambulatory Visit: Payer: Medicaid Other | Admitting: *Deleted

## 2014-10-09 DIAGNOSIS — F1010 Alcohol abuse, uncomplicated: Secondary | ICD-10-CM

## 2014-10-09 NOTE — Progress Notes (Signed)
Patient ID: Calvin Gardner, male   DOB: 1951-05-05, 64 y.o.   MRN: [id number]

## 2014-10-09 NOTE — BH Specialist Note (Signed)
Calvin Gardner was present today for his scheduled appointment with counselor.  Patient was oriented times four with good affect and dress.  Patient was fidgety during the counseling session and was constantly moving around in his seat. Patient indicated that he is still drinking ever day and tends to drink around 3-4 40's when he does drink.  Typically, patient says that he drinks with others not alone.  Counselor talked with patient on treatment and whether or not he needed it.  Patient communicated that he would consider going to treatment but was worried about who would take care of his girlfriend while he was there.  Counselor asked patient who was taking care of her while he was at this appointment and he said she was alone.  Counselor asked couldn't she then be alone while he was in group for a couple of hours.  Patient said he wasn't sure.  Patient made a few contradictory comments during session such as saying he never had treatment and yet later in the session says his probation officer sent [facility] years ago. Patient does not appear to be serious or invested at this time for treatment as evidenced by  Only answering questions a briefly as he could and not initiating any questions and or comments on his own. . Patient made comments that he would try to stop drinking and he would think about attending substance abuse treatment.  Patient looked at his watch a few times and appeared to be in a hurry to be done with the appointment.  It would appear that patient is only at the counseling session as a result of being obligated to come and meet. Counselor rates patient at the "pre-contemplation" stage of deciding about recovery or not.  Patient was willing to come back for an appointment two weeks from now and in the meantime would consider the [facility] treatment for his substance abuse.  Rolena Infante, LPCA, MA [facility]

## 2014-10-16 ENCOUNTER — Ambulatory Visit (HOSPITAL_COMMUNITY): Payer: Medicaid Other

## 2014-10-23 ENCOUNTER — Encounter: Payer: Self-pay | Admitting: Internal Medicine

## 2014-10-23 ENCOUNTER — Ambulatory Visit: Payer: Medicaid Other | Admitting: *Deleted

## 2014-10-23 ENCOUNTER — Ambulatory Visit (INDEPENDENT_AMBULATORY_CARE_PROVIDER_SITE_OTHER): Payer: Medicaid Other | Admitting: Internal Medicine

## 2014-10-23 VITALS — BP 150/83 | HR 97 | Temp 98.0°F | Ht 66.0 in | Wt 138.0 lb

## 2014-10-23 DIAGNOSIS — K7460 Unspecified cirrhosis of liver: Secondary | ICD-10-CM

## 2014-10-23 DIAGNOSIS — F1010 Alcohol abuse, uncomplicated: Secondary | ICD-10-CM

## 2014-10-23 DIAGNOSIS — B182 Chronic viral hepatitis C: Secondary | ICD-10-CM

## 2014-10-23 DIAGNOSIS — K7400 Hepatic fibrosis, unspecified: Secondary | ICD-10-CM | POA: Insufficient documentation

## 2014-10-23 MED ORDER — RIBAVIRIN 200 MG PO TABS: ORAL_TABLET | ORAL | Status: DC

## 2014-10-23 MED ORDER — OMBITAS-PARITAPRE-RITONA-DASAB 12.5-75-50 &250 MG PO TBPK: 1.0000 | ORAL_TABLET | Freq: Two times a day (BID) | ORAL | Status: DC

## 2014-10-23 NOTE — BH Specialist Note (Signed)
Calvin Gardner was present today for his scheduled appointment.  Client was oriented times four with good affect and dress.  Client was alert and talkative.  Client reported that he had not used since the13th of this month. Client shared that he was taking his diagnosis of Hepatitis C important and shared that he was not going to drink any more.  Client stated that he truly began to take it serious when a friend of his died as a result of the same disease and continuing to drink.  Client indicated that he starts to receive his social security benefits the 28th of this month and was presently looking for a new place to live.  Client communicated that he lives with his girlfriend who is disabled and in a wheelchair, who allows too many young children in her family to come to their home and stay for long periods of time without being supervised. Client says that it is loud, chaotic and not a positive environment in which to stop using alcohol.  Client said that he was not sleeping well and wakes up constantly after falling asleep. Counselor talked with client about early stages of recovery such as: awareness and early acknowledgement, consideration, early recovery, and active recovery. Counselor inform client about the use of alcohol and his disease. Counselor explored each stage with client and allowed client to comment and provide feedback accordingly.  Counselor further provided support and encouragement. Client made another appointment to see counselor in two weeks.   Rolena Infante, LPCA,MA [facility]

## 2014-10-23 NOTE — Progress Notes (Signed)
   Subjective:    Patient ID: Calvin Gardner, male    DOB: [birth year], 64 y.o.   MRN: [id number]  HPI He comes in here for follow-up of hepatitis C. He is genotype 1A, with a viral load of 5 million, hepatitis B immune and hepatitis a non-immune. He has a history of alcohol abuse and has been seeing our substance abuse counselor. He feels he is doing really well with alcohol abstinence.  He did have elastography which corresponds within F2 to F3 fibrosis though does show some coarse echotexture that is concerning for cirrhosis. This also could be due to hepatic steatosis. He has no complaints today.   Review of Systems  Constitutional: Negative for fatigue.  Gastrointestinal: Negative for nausea and diarrhea.  Skin: Negative for rash.  Neurological: Negative for dizziness, light-headedness and headaches.       Objective:   Physical Exam  Constitutional: He appears well-developed and well-nourished. No distress.  Eyes: No scleral icterus.  Cardiovascular: Normal rate, regular rhythm and normal heart sounds.   No murmur heard. Pulmonary/Chest: Effort normal and breath sounds normal. No respiratory distress. He has no wheezes.  Skin: No rash noted.          Assessment & Plan:

## 2014-10-23 NOTE — Assessment & Plan Note (Signed)
He is doing well with alcohol cessation and I will have him sign the readiness form today. I will then get him started on therapy as soon as available.  He does have concern for cirrhosis on his ultrasound but likely will still get Rozann Lesches by [facility] guidelines.

## 2014-10-23 NOTE — Assessment & Plan Note (Signed)
Elastography is just F2 to 3 there is concern of coarse echotexture on for sound and so we will treated as cirrhosis. He will get screening ultrasound every 6 months. I also will consider repeat elastography in about one year. This was discussed with the patient.

## 2014-10-23 NOTE — Progress Notes (Signed)
HPI: Calvin Gardner is a 64 y.o. male who presents to the [facility] today for follow-up of a positive Hep C lab test and results of his elastography.  Lab Results  Component Value Date   HCVGENOTYPE 1a 09/19/2014    Allergies: No Known Allergies  Vitals: Temp: 98 F (36.7 C) (03/22 1357) Temp Source: Oral (03/22 1357) BP: 150/83 mmHg (03/22 1357) Pulse Rate: 97 (03/22 1357)  Past Medical History: Past Medical History  Diagnosis Date  . Cataract   . Hypertension     Social History: History   Social History  . Marital Status: Single    Spouse Name: N/A  . Number of Children: N/A  . Years of Education: N/A   Social History Main Topics  . Smoking status: Current Some Day Smoker    Types: Cigarettes  . Smokeless tobacco: Never Used     Comment: trying to quit  . Alcohol Use: Yes     Comment: occasional  . Drug Use: No  . Sexual Activity: Not on file   Other Topics Concern  . None   Social History Narrative    Labs: HEP B S AB (no units)  Date Value  01/30/2014 INDETER   HEPATITIS B SURFACE AG (no units)  Date Value  07/31/2014 NEGATIVE    Lab Results  Component Value Date   HCVGENOTYPE 1a 09/19/2014    Hepatitis C RNA quantitative Latest Ref Rng 09/19/2014 01/30/2014  HCV Quantitative <15 IU/mL 8676195(K) 1221019(H)  HCV Quantitative Log <1.18 log 10 6.73(H) 6.09(H)    AST (U/L)  Date Value  07/06/2014 96*  01/05/2014 58*   ALT (U/L)  Date Value  07/06/2014 53  01/05/2014 57*   INR (no units)  Date Value  07/31/2014 1.02    CrCl: CrCl cannot be calculated (Patient has no serum creatinine result on file.).  Fibrosis Score: F2/F3 as assessed by elastography   Child-Pugh Score: A  Previous Treatment Regimen: None  Assessment: 64 yo m who presents to the [facility] today for follow-up of his elastography.  The patient has a Medicaid card.  Discussed options for him as far as Hep C therapy.  Printed out application for [employer] +  ribavirin and went over the readiness sheet with the patient.  He signed the papers and the papers were faxed in this afternoon.   Recommendations: Rozann Lesches + ribavirin for 12 weeks  Rayaan Lorah L. Lamont Snowball.D., BCPS, AAHIVP [facility] [place] [facility] 10/23/2014, 3:18 PM

## 2014-10-24 ENCOUNTER — Other Ambulatory Visit: Payer: Self-pay | Admitting: Pharmacist Clinician (PhC)/ Clinical Pharmacy Specialist

## 2014-10-24 ENCOUNTER — Other Ambulatory Visit: Payer: Self-pay | Admitting: Internal Medicine

## 2014-10-24 DIAGNOSIS — B1920 Unspecified viral hepatitis C without hepatic coma: Secondary | ICD-10-CM

## 2014-10-24 MED ORDER — OMBITAS-PARITAPRE-RITONA-DASAB 12.5-75-50 &250 MG PO TBPK: 1.0000 | ORAL_TABLET | Freq: Two times a day (BID) | ORAL | Status: DC

## 2014-10-24 MED ORDER — RIBAVIRIN 200 MG PO TABS: ORAL_TABLET | ORAL | Status: DC

## 2014-11-06 ENCOUNTER — Ambulatory Visit: Payer: Medicaid Other | Admitting: *Deleted

## 2014-11-26 ENCOUNTER — Other Ambulatory Visit: Payer: Medicaid Other

## 2014-11-26 DIAGNOSIS — B1920 Unspecified viral hepatitis C without hepatic coma: Secondary | ICD-10-CM

## 2014-11-26 LAB — CBC WITH DIFFERENTIAL/PLATELET
Basophils Absolute: 0 10*3/uL (ref 0.0–0.1)
Basophils Relative: 0 % (ref 0–1)
Eosinophils Absolute: 0.1 10*3/uL (ref 0.0–0.7)
Eosinophils Relative: 1 % (ref 0–5)
HCT: 39.2 % (ref 39.0–52.0)
Hemoglobin: 12.7 g/dL — ABNORMAL LOW (ref 13.0–17.0)
Lymphocytes Relative: 19 % (ref 12–46)
Lymphs Abs: 1.7 10*3/uL (ref 0.7–4.0)
MCH: 29.5 pg (ref 26.0–34.0)
MCHC: 32.4 g/dL (ref 30.0–36.0)
MCV: 91.2 fL (ref 78.0–100.0)
MPV: 8.4 fL — ABNORMAL LOW (ref 8.6–12.4)
Monocytes Absolute: 1 10*3/uL (ref 0.1–1.0)
Monocytes Relative: 11 % (ref 3–12)
Neutro Abs: 6 10*3/uL (ref 1.7–7.7)
Neutrophils Relative %: 69 % (ref 43–77)
Platelets: 310 10*3/uL (ref 150–400)
RBC: 4.3 MIL/uL (ref 4.22–5.81)
RDW: 13.3 % (ref 11.5–15.5)
WBC: 8.7 10*3/uL (ref 4.0–10.5)

## 2014-11-27 ENCOUNTER — Ambulatory Visit: Payer: Medicaid Other | Admitting: *Deleted

## 2014-11-27 DIAGNOSIS — F1010 Alcohol abuse, uncomplicated: Secondary | ICD-10-CM

## 2014-11-27 LAB — COMPREHENSIVE METABOLIC PANEL
ALT: 8 U/L (ref 0–53)
AST: 16 U/L (ref 0–37)
Albumin: 3.8 g/dL (ref 3.5–5.2)
Alkaline Phosphatase: 56 U/L (ref 39–117)
BUN: 6 mg/dL (ref 6–23)
CALCIUM: 9.5 mg/dL (ref 8.4–10.5)
CO2: 22 mEq/L (ref 19–32)
CREATININE: 0.87 mg/dL (ref 0.50–1.35)
Chloride: 102 mEq/L (ref 96–112)
Glucose, Bld: 77 mg/dL (ref 70–99)
POTASSIUM: 4.4 meq/L (ref 3.5–5.3)
Sodium: 133 mEq/L — ABNORMAL LOW (ref 135–145)
TOTAL PROTEIN: 6.9 g/dL (ref 6.0–8.3)
Total Bilirubin: 1 mg/dL (ref 0.2–1.2)

## 2014-11-27 LAB — HEPATITIS C RNA QUANTITATIVE: HCV Quantitative: NOT DETECTED IU/mL (ref <15)

## 2014-11-27 NOTE — BH Specialist Note (Signed)
Calvin Gardner was present for his scheduled appointment today.  Client was oriented times four with good affect and dress.  Client was alert and talkative.  Client communicated with counselor that he is doing great and that he has not used in since the 24th of last month.  Client stated that he is getting to the point now where he does not crave it and is surprised that he is not wanting it more.  Client shared that he has recently been in a couple of situations where people around him were drinking but he was able to resist any tempation or compulsiveness to use.  Counselor educated client on high risk situations and encouraged him to avoid them as much as possible. Counselor discussed with client about three types of thinking: addict thinking (where you only think about using), clean thinking (where you don't use but constantly think about using), and clear thinking (where you don't think about using any more). Counselor further explained that hopefully his goal is to continue to move towards "clear thinking" and this is done by changing your lifestyle. Counselor shared with client that just relying on will power is not enough. One must be smart as well if not more than having will power. Counselor encouraged client to not underestimate addiction.  Client agreed and vowed to be smart along with having will power.  Rolena Infante, LPCA, MA [facility]

## 2014-12-11 ENCOUNTER — Encounter: Payer: Self-pay | Admitting: Internal Medicine

## 2014-12-11 ENCOUNTER — Ambulatory Visit: Payer: Medicaid Other | Admitting: *Deleted

## 2014-12-11 ENCOUNTER — Ambulatory Visit (INDEPENDENT_AMBULATORY_CARE_PROVIDER_SITE_OTHER): Payer: Medicaid Other | Admitting: Internal Medicine

## 2014-12-11 VITALS — BP 130/82 | HR 92 | Temp 98.2°F | Wt 137.0 lb

## 2014-12-11 DIAGNOSIS — B182 Chronic viral hepatitis C: Secondary | ICD-10-CM | POA: Diagnosis not present

## 2014-12-11 DIAGNOSIS — F1010 Alcohol abuse, uncomplicated: Secondary | ICD-10-CM

## 2014-12-11 DIAGNOSIS — K7460 Unspecified cirrhosis of liver: Secondary | ICD-10-CM | POA: Diagnosis not present

## 2014-12-11 NOTE — Progress Notes (Signed)
   Subjective:    Patient ID: Calvin Gardner, male    DOB: 05-30-1951, 64 y.o.   MRN: [id number]  HPI He comes in all up of hepatitis C. 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 He started on [facility] with the thousand milligrams of ribavirin divided twice a day.  He reports excellent compliance with no missed doses. His post treatment hemoglobin is 12.7 which is similar to previous results. His transaminases have normalized and he continues and alcohol cessation counseling. He feels good and is pleased with his results. No issues with the medication. His elastography is F2 and 3 but with some concerns for cirrhosis.   Review of Systems  Constitutional: Negative for activity change, appetite change and fatigue.  Gastrointestinal: Negative for nausea and diarrhea.  Skin: Negative for rash.  Neurological: Negative for dizziness and light-headedness.       Objective:   Physical Exam  Constitutional: He appears well-developed and well-nourished. No distress.  Eyes: No scleral icterus.  Cardiovascular: Normal rate, regular rhythm and normal heart sounds.   No murmur heard. Pulmonary/Chest: Effort normal and breath sounds normal. No respiratory distress.  Skin: No rash noted.          Assessment & Plan:

## 2014-12-11 NOTE — Assessment & Plan Note (Signed)
It is unclear if it cirrhosis or not. I will check an MRI with his next screening test in about September or October of this year.

## 2014-12-11 NOTE — Assessment & Plan Note (Signed)
He is doing well with his regimen.  I will continue with the same ribavirin and check his CBC in 3 weeks and see him after that.

## 2014-12-12 NOTE — BH Specialist Note (Signed)
Kavir showed for his scheduled appointment today with counselor.  Client was oriented times four with good affect and dress.  Client was alert and talkative.  Client was in good spirits but communicated that his nephew that was living with him and his girlfriend was out of control and constantly misbehaving.  Client said that is why he arrived an hor early for his appointment today.  He stated he just wanted to get out of the house.  Client reported complete sobriety and shared that he has not drank now does he desire too.  Client communicated that he feels he has totally let drinking go and does not crave it at all even when around others who are drinking.  Client stated that his recent health issues and [facility] staff has showed him that drinking is not a wise thing to do now as it affects his health issues too seriously.  Client stated that he wants to possibly help other recognize that drinking excessively is not good.  Client says that he has been talkaing to his friends that he use to drink with and is encouraging them to refrain and slow down with their drinking.  Counselor educated client about relapse and lapse.  Counselor educated client on avoiding relapse drift.  Counselor had client identify things in his life that anchors him to his recovery.  Counselor had client identify things in his life he needs to avoid in order to continue with his sobriety and goal of staying clean.  Client engaged in discussions accordingly. Counselor provided support and encouragement.  Client will now meet with counselor less as he has been progressing positively for a substantial amount of time and does not require to meet as often.   Rolena Infante, LPCA, MA [facility]

## 2015-01-01 ENCOUNTER — Encounter: Payer: Self-pay | Admitting: *Deleted

## 2015-01-01 ENCOUNTER — Other Ambulatory Visit (INDEPENDENT_AMBULATORY_CARE_PROVIDER_SITE_OTHER): Payer: Medicaid Other

## 2015-01-01 ENCOUNTER — Ambulatory Visit: Payer: Medicaid Other | Admitting: *Deleted

## 2015-01-01 DIAGNOSIS — B182 Chronic viral hepatitis C: Secondary | ICD-10-CM

## 2015-01-01 DIAGNOSIS — F1010 Alcohol abuse, uncomplicated: Secondary | ICD-10-CM

## 2015-01-01 LAB — CBC WITH DIFFERENTIAL/PLATELET
BASOS PCT: 0 % (ref 0–1)
Basophils Absolute: 0 10*3/uL (ref 0.0–0.1)
Eosinophils Absolute: 0.1 10*3/uL (ref 0.0–0.7)
Eosinophils Relative: 1 % (ref 0–5)
HCT: 34.9 % — ABNORMAL LOW (ref 39.0–52.0)
HEMOGLOBIN: 10.8 g/dL — AB (ref 13.0–17.0)
LYMPHS ABS: 1.8 10*3/uL (ref 0.7–4.0)
Lymphocytes Relative: 18 % (ref 12–46)
MCH: 27.1 pg (ref 26.0–34.0)
MCHC: 30.9 g/dL (ref 30.0–36.0)
MCV: 87.7 fL (ref 78.0–100.0)
MONO ABS: 1 10*3/uL (ref 0.1–1.0)
MONOS PCT: 10 % (ref 3–12)
MPV: 8.1 fL — ABNORMAL LOW (ref 8.6–12.4)
NEUTROS ABS: 7 10*3/uL (ref 1.7–7.7)
Neutrophils Relative %: 71 % (ref 43–77)
Platelets: 374 10*3/uL (ref 150–400)
RBC: 3.98 MIL/uL — ABNORMAL LOW (ref 4.22–5.81)
RDW: 13 % (ref 11.5–15.5)
WBC: 9.8 10*3/uL (ref 4.0–10.5)

## 2015-01-01 NOTE — BH Specialist Note (Signed)
Calvin Gardner was present for his schduled appointment today. Client was oriented times four with good affect and dress.  Although client was dressed neatly, he appeared like he had lost some weight.  Client indicated that he felt his clothes were fitting a little bit loose compared to normal.  Counselor inquired with client as to why he was losing weight and he stated he didn't know but felt he was not as hungry as usual. Counselor weighed client and his reading was 132.  Client stated that he was weighed a few weeks ago and weighed 138.  Client shared that he has still maintained his sobriety and has not drank a drop of anything.  Client stated that he has no craving and or desire to drink or use any drugs without a prescription for it. Client indicted that he is having a difficult time living in his home due to his girlfriend's niece and her children living there.  Client stated that he is having a difficult time moving because he does not have a lot of money to put down on a new place and he does not want to live where it is unsafe.  Counselor educated client about avoiding high risk situations and that moving into a neighborhood where drugs and alcohol abuse is common is not wise. Client agreed and stated he feels the same way.  Counselor talked with client about external triggers such as: friends/family who use, neighborhood, work environment, certain situations.  Counselor shared how these external triggers and other could lead to substance use.  Counselor assisted client in understanding that external triggers are things that are a part of his lifestyle and the choices that he makes on a daily basis.  Client responded well and engaged in all topic discussions accordingly.  Client went straight into his lab work.  Client made another appointment to see counselor in 2 weeks.   Rolena Infante, LPCA, MA [facility]

## 2015-01-05 ENCOUNTER — Other Ambulatory Visit: Payer: Self-pay | Admitting: Internal Medicine

## 2015-01-08 ENCOUNTER — Ambulatory Visit (INDEPENDENT_AMBULATORY_CARE_PROVIDER_SITE_OTHER): Payer: Medicaid Other | Admitting: Internal Medicine

## 2015-01-08 ENCOUNTER — Encounter: Payer: Self-pay | Admitting: Internal Medicine

## 2015-01-08 VITALS — BP 134/78 | HR 87 | Temp 98.2°F | Ht 66.0 in | Wt 134.0 lb

## 2015-01-08 DIAGNOSIS — B182 Chronic viral hepatitis C: Secondary | ICD-10-CM

## 2015-01-08 NOTE — Progress Notes (Signed)
   Subjective:    Patient ID: Calvin Gardner, male    DOB: 01/04/1951, 64 y.o.   MRN: [id number]  HPI He comes in all up of hepatitis C. 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 He started on [facility] with the thousand milligrams of ribavirin divided twice a day.  He reports excellent compliance with no missed doses. His post treatment initiation hemoglobin is 12.7 which is similar to previous results. His Hgb now though is down to 10.8.  His elastography is F2 and 3 but with some concerns for cirrhosis.     Review of Systems  Constitutional: Negative for activity change, appetite change and fatigue.  Gastrointestinal: Negative for nausea and diarrhea.  Skin: Negative for rash.  Neurological: Negative for dizziness and light-headedness.       Objective:   Physical Exam  Constitutional: He appears well-developed and well-nourished. No distress.  Eyes: No scleral icterus.  Cardiovascular: Normal rate, regular rhythm and normal heart sounds.   No murmur heard. Pulmonary/Chest: Effort normal and breath sounds normal. No respiratory distress.  Skin: No rash noted.          Assessment & Plan:

## 2015-01-08 NOTE — Progress Notes (Signed)
Patient ID: Calvin Gardner, male   DOB: 1950-09-13, 64 y.o.   MRN: [id number]  HPI: Calvin Gardner is a 64 y.o. male presents to clinic for follow up of hepatitis C management. Patient started Rozann Lesches + RBV with excellent compliance.   Lab Results  Component Value Date   HCVGENOTYPE 1a 09/19/2014    Allergies: No Known Allergies  Vitals: Temp: 98.2 F (36.8 C) (06/07 0851) Temp Source: Oral (06/07 0851) BP: 134/78 mmHg (06/07 0851) Pulse Rate: 87 (06/07 0851)  Past Medical History: Past Medical History  Diagnosis Date  . Cataract   . Hypertension     Social History: History   Social History  . Marital Status: Single    Spouse Name: N/A  . Number of Children: N/A  . Years of Education: N/A   Social History Main Topics  . Smoking status: Current Some Day Smoker    Types: Cigarettes  . Smokeless tobacco: Never Used     Comment: trying to quit  . Alcohol Use: 0.0 oz/week    0 Standard drinks or equivalent per week     Comment: occasional  . Drug Use: No  . Sexual Activity: Not on file   Other Topics Concern  . None   Social History Narrative    Labs: HEP B S AB (no units)  Date Value  01/30/2014 INDETER   HEPATITIS B SURFACE AG (no units)  Date Value  07/31/2014 NEGATIVE    Lab Results  Component Value Date   HCVGENOTYPE 1a 09/19/2014    Hepatitis C RNA quantitative Latest Ref Rng 11/26/2014 09/19/2014 01/30/2014  HCV Quantitative <15 IU/mL Not Detected [id number](I) 1221019(H)  HCV Quantitative Log <1.18 log 10 NOT CALC 6.73(H) 6.09(H)    AST (U/L)  Date Value  11/26/2014 16  07/06/2014 96*  01/05/2014 58*   ALT (U/L)  Date Value  11/26/2014 8  07/06/2014 53  01/05/2014 57*   INR (no units)  Date Value  07/31/2014 1.02    CrCl: CrCl cannot be calculated (Patient has no serum creatinine result on file.).  Fibrosis Score: F2/F3 as assessed by elastography   Child-Pugh Score: Class A  Previous Treatment Regimen: Treatment  naive  Assessment: Patient continues on treatment, doing well and tolerating well with no side effects. Patient's pretreatment Hgb 12.7 and recent labs reveal a drop in 2g to 10.8. I agree with Dr. Henreitta Leber decision to decrease the RBV from 1000mg  to 600mg  daily. I had a discussion with the patient regarding this change and that this will help with his fatigue and being cold. A mymedschedule was created for the patient to better his understanding of this change and to continue taking the [facility] as he usually does and to only take the 3 tablets of the RBV in the morning. Patient verbalized understanding after going over it a couple times.  Recommendations: Continue Rozann Lesches as prescribed Decrease RBV to 600mg  once daily Continue excellent compliance Follow up appt per Dr. Junious Dresser, [place], [place].D. Clinical [facility] [place] [facility] 01/08/2015, 9:16 AM

## 2015-01-08 NOTE — Assessment & Plan Note (Signed)
He is doing well. With a 2 gram drop in Hgb, will have him take just 600 mg in am.

## 2015-01-10 ENCOUNTER — Encounter: Payer: Self-pay | Admitting: Internal Medicine

## 2015-01-10 ENCOUNTER — Ambulatory Visit: Payer: Medicaid Other | Attending: Internal Medicine | Admitting: Internal Medicine

## 2015-01-10 VITALS — BP 144/79 | HR 84 | Temp 98.0°F | Resp 16 | Wt 135.8 lb

## 2015-01-10 DIAGNOSIS — I10 Essential (primary) hypertension: Secondary | ICD-10-CM | POA: Diagnosis not present

## 2015-01-10 DIAGNOSIS — Z720 Tobacco use: Secondary | ICD-10-CM | POA: Diagnosis not present

## 2015-01-10 DIAGNOSIS — B1920 Unspecified viral hepatitis C without hepatic coma: Secondary | ICD-10-CM | POA: Diagnosis not present

## 2015-01-10 DIAGNOSIS — F17200 Nicotine dependence, unspecified, uncomplicated: Secondary | ICD-10-CM

## 2015-01-10 MED ORDER — AMLODIPINE BESYLATE 5 MG PO TABS: 5.0000 mg | ORAL_TABLET | Freq: Every day | ORAL | Status: DC

## 2015-01-10 NOTE — Progress Notes (Signed)
MRN: [id number] Name: Calvin Gardner  Sex: male Age: 64 y.o. DOB: 08-10-1950  Allergies: Review of patient's allergies indicates no known allergies.  Chief Complaint  Patient presents with  . Follow-up    HPI: Patient is 64 y.o. male who has history of hypertension, chronic hepatitis C comes today for followup her as per patient he then out of his blood pressure medication and its refill today's blood pressure is borderline elevated, denies any headache dizziness chest and shortness of breath, he has been following up with ID and is getting treatment for hepatitis C. Patient is to smoke cigarettes, I counseled patient to quit smoking.  Past Medical History  Diagnosis Date  . Cataract   . Hypertension     Past Surgical History  Procedure Laterality Date  . Left jaw surgery     . Mandible fracture surgery    . Colonoscopy with propofol N/A 01/30/2014    Procedure: COLONOSCOPY WITH PROPOFOL;  Surgeon: Garlan Fair, MD;  Location: [facility];  Service: Endoscopy;  Laterality: N/A;  . Esophagogastroduodenoscopy (egd) with propofol N/A 01/30/2014    Procedure: ESOPHAGOGASTRODUODENOSCOPY (EGD) WITH PROPOFOL;  Surgeon: Garlan Fair, MD;  Location: [facility] ENDOSCOPY;  Service: Endoscopy;  Laterality: N/A;      Medication List       This list is accurate as of: 01/10/15 11:11 AM.  Always use your most recent med list.               amLODipine 5 MG tablet  Commonly known as:  NORVASC  Take 1 tablet (5 mg total) by mouth daily.     aspirin EC 81 MG tablet  Take 81 mg by mouth every morning.     Ombitas-Paritapre-Ritona-Dasab 12.5-75-50 &250 MG Tbpk  Commonly known as:  VIEKIRA PAK  Take 1 packet by mouth 2 (two) times daily.     ribavirin 200 MG tablet  Commonly known as:  COPEGUS  600 mg in am and 400 mg in pm        Meds ordered this encounter  Medications  . amLODipine (NORVASC) 5 MG tablet    Sig: Take 1 tablet (5 mg total) by mouth daily.    Dispense:   90 tablet    Refill:  1    Immunization History  Administered Date(s) Administered  . Hepatitis A, Adult 09/19/2014  . Pneumococcal Polysaccharide-23 07/06/2014    History reviewed. No pertinent family history.  History  Substance Use Topics  . Smoking status: Current Some Day Smoker    Types: Cigarettes  . Smokeless tobacco: Never Used     Comment: trying to quit  . Alcohol Use: 0.0 oz/week    0 Standard drinks or equivalent per week     Comment: occasional    Review of Systems   As noted in HPI  Filed Vitals:   01/10/15 1053  BP: 144/79  Pulse: 84  Temp: 98 F (36.7 C)  Resp: 16    Physical Exam  Physical Exam  Constitutional: No distress.  Eyes: EOM are normal. Pupils are equal, round, and reactive to light.  Cardiovascular: Normal rate and regular rhythm.   Pulmonary/Chest: Breath sounds normal. No respiratory distress. He has no wheezes. He has no rales.  Musculoskeletal: He exhibits no edema.    CBC    Component Value Date/Time   WBC 9.8 01/01/2015 1101   RBC 3.98* 01/01/2015 1101   HGB 10.8* 01/01/2015 1101   HCT 34.9* 01/01/2015 1101  PLT 374 01/01/2015 1101   MCV 87.7 01/01/2015 1101   LYMPHSABS 1.8 01/01/2015 1101   MONOABS 1.0 01/01/2015 1101   EOSABS 0.1 01/01/2015 1101   BASOSABS 0.0 01/01/2015 1101    CMP     Component Value Date/Time   NA 133* 11/26/2014 0928   K 4.4 11/26/2014 0928   CL 102 11/26/2014 0928   CO2 22 11/26/2014 0928   GLUCOSE 77 11/26/2014 0928   BUN 6 11/26/2014 0928   CREATININE 0.87 11/26/2014 0928   CREATININE 0.70 11/02/2013 1841   CALCIUM 9.5 11/26/2014 0928   PROT 6.9 11/26/2014 0928   ALBUMIN 3.8 11/26/2014 0928   AST 16 11/26/2014 0928   ALT 8 11/26/2014 0928   ALKPHOS 56 11/26/2014 0928   BILITOT 1.0 11/26/2014 0928   GFRNONAA >89 07/06/2014 1209   GFRNONAA >90 11/02/2013 1841   GFRAA >89 07/06/2014 1209   GFRAA >90 11/02/2013 1841    Lab Results  Component Value Date/Time   CHOL 177  01/05/2014 04:20 PM    No results found for: HGBA1C  Lab Results  Component Value Date/Time   AST 16 11/26/2014 09:28 AM    Assessment and Plan  Essential hypertension, benign - Plan: advised patient for DASH diet, resume back on amLODipine (NORVASC) 5 MG tablet  Smoking Consultation quit smoking.  Hepatitis C virus infection without hepatic coma, unspecified chronicity Following up with ID   Return in about 4 months (around 05/12/2015), or if symptoms worsen or fail to improve.   This note has been created with Surveyor, quantity. Any transcriptional errors are unintentional.    Lorayne Marek, MD

## 2015-01-10 NOTE — Patient Instructions (Signed)
DASH Eating Plan °DASH stands for "Dietary Approaches to Stop Hypertension." The DASH eating plan is a healthy eating plan that has been shown to reduce high blood pressure (hypertension). Additional health benefits may include reducing the risk of type 2 diabetes mellitus, heart disease, and stroke. The DASH eating plan may also help with weight loss. °WHAT DO I NEED TO KNOW ABOUT THE DASH EATING PLAN? °For the DASH eating plan, you will follow these general guidelines: °· Choose foods with a percent daily value for sodium of less than 5% (as listed on the food label). °· Use salt-free seasonings or herbs instead of table salt or sea salt. °· Check with your health care provider or pharmacist before using salt substitutes. °· Eat lower-sodium products, often labeled as "lower sodium" or "no salt added." °· Eat fresh foods. °· Eat more vegetables, fruits, and low-fat dairy products. °· Choose whole grains. Look for the word "whole" as the first word in the ingredient list. °· Choose fish and skinless chicken or turkey more often than red meat. Limit fish, poultry, and meat to 6 oz (170 g) each day. °· Limit sweets, desserts, sugars, and sugary drinks. °· Choose heart-healthy fats. °· Limit cheese to 1 oz (28 g) per day. °· Eat more home-cooked food and less restaurant, buffet, and fast food. °· Limit fried foods. °· Cook foods using methods other than frying. °· Limit canned vegetables. If you do use them, rinse them well to decrease the sodium. °· When eating at a restaurant, ask that your food be prepared with less salt, or no salt if possible. °WHAT FOODS CAN I EAT? °Seek help from a dietitian for individual calorie needs. °Grains °Whole grain or whole wheat bread. Brown rice. Whole grain or whole wheat pasta. Quinoa, bulgur, and whole grain cereals. Low-sodium cereals. Corn or whole wheat flour tortillas. Whole grain cornbread. Whole grain crackers. Low-sodium crackers. °Vegetables °Fresh or frozen vegetables  (raw, steamed, roasted, or grilled). Low-sodium or reduced-sodium tomato and vegetable juices. Low-sodium or reduced-sodium tomato sauce and paste. Low-sodium or reduced-sodium canned vegetables.  °Fruits °All fresh, canned (in natural juice), or frozen fruits. °Meat and Other Protein Products °Ground beef (85% or leaner), grass-fed beef, or beef trimmed of fat. Skinless chicken or turkey. Ground chicken or turkey. Pork trimmed of fat. All fish and seafood. Eggs. Dried beans, peas, or lentils. Unsalted nuts and seeds. Unsalted canned beans. °Dairy °Low-fat dairy products, such as skim or 1% milk, 2% or reduced-fat cheeses, low-fat ricotta or cottage cheese, or plain low-fat yogurt. Low-sodium or reduced-sodium cheeses. °Fats and Oils °Tub margarines without trans fats. Light or reduced-fat mayonnaise and salad dressings (reduced sodium). Avocado. Safflower, olive, or canola oils. Natural peanut or almond butter. °Other °Unsalted popcorn and pretzels. °The items listed above may not be a complete list of recommended foods or beverages. Contact your dietitian for more options. °WHAT FOODS ARE NOT RECOMMENDED? °Grains °White bread. White pasta. White rice. Refined cornbread. Bagels and croissants. Crackers that contain trans fat. °Vegetables °Creamed or fried vegetables. Vegetables in a cheese sauce. Regular canned vegetables. Regular canned tomato sauce and paste. Regular tomato and vegetable juices. °Fruits °Dried fruits. Canned fruit in light or heavy syrup. Fruit juice. °Meat and Other Protein Products °Fatty cuts of meat. Ribs, chicken wings, bacon, sausage, bologna, salami, chitterlings, fatback, hot dogs, bratwurst, and packaged luncheon meats. Salted nuts and seeds. Canned beans with salt. °Dairy °Whole or 2% milk, cream, half-and-half, and cream cheese. Whole-fat or sweetened yogurt. Full-fat   cheeses or blue cheese. Nondairy creamers and whipped toppings. Processed cheese, cheese spreads, or cheese  curds. °Condiments °Onion and garlic salt, seasoned salt, table salt, and sea salt. Canned and packaged gravies. Worcestershire sauce. Tartar sauce. Barbecue sauce. Teriyaki sauce. Soy sauce, including reduced sodium. Steak sauce. Fish sauce. Oyster sauce. Cocktail sauce. Horseradish. Ketchup and mustard. Meat flavorings and tenderizers. Bouillon cubes. Hot sauce. Tabasco sauce. Marinades. Taco seasonings. Relishes. °Fats and Oils °Butter, stick margarine, lard, shortening, ghee, and bacon fat. Coconut, palm kernel, or palm oils. Regular salad dressings. °Other °Pickles and olives. Salted popcorn and pretzels. °The items listed above may not be a complete list of foods and beverages to avoid. Contact your dietitian for more information. °WHERE CAN I FIND MORE INFORMATION? °National Heart, Lung, and Blood Institute: www.nhlbi.nih.gov/health/health-topics/topics/dash/ °Document Released: 07/09/2011 Document Revised: 12/04/2013 Document Reviewed: 05/24/2013 °ExitCare® Patient Information ©2015 [employer]. This information is not intended to replace advice given to you by your health care provider. Make sure you discuss any questions you have with your health care provider. ° °

## 2015-01-10 NOTE — Progress Notes (Signed)
Patient here for follow up on his hepatitis and HTN Patient also requesting a refill on his blood pressure medication

## 2015-02-12 ENCOUNTER — Encounter: Payer: Self-pay | Admitting: Internal Medicine

## 2015-02-12 ENCOUNTER — Ambulatory Visit (INDEPENDENT_AMBULATORY_CARE_PROVIDER_SITE_OTHER): Payer: Medicaid Other | Admitting: Internal Medicine

## 2015-02-12 VITALS — BP 138/96 | HR 109 | Temp 98.5°F | Wt 137.0 lb

## 2015-02-12 DIAGNOSIS — B182 Chronic viral hepatitis C: Secondary | ICD-10-CM | POA: Diagnosis present

## 2015-02-12 DIAGNOSIS — K7460 Unspecified cirrhosis of liver: Secondary | ICD-10-CM

## 2015-02-12 LAB — CBC WITH DIFFERENTIAL/PLATELET
BASOS ABS: 0 10*3/uL (ref 0.0–0.1)
BASOS PCT: 0 % (ref 0–1)
EOS PCT: 3 % (ref 0–5)
Eosinophils Absolute: 0.2 10*3/uL (ref 0.0–0.7)
HEMATOCRIT: 42.7 % (ref 39.0–52.0)
Hemoglobin: 13.4 g/dL (ref 13.0–17.0)
LYMPHS ABS: 1.9 10*3/uL (ref 0.7–4.0)
Lymphocytes Relative: 26 % (ref 12–46)
MCH: 27.2 pg (ref 26.0–34.0)
MCHC: 31.4 g/dL (ref 30.0–36.0)
MCV: 86.8 fL (ref 78.0–100.0)
MONOS PCT: 10 % (ref 3–12)
MPV: 8.6 fL (ref 8.6–12.4)
Monocytes Absolute: 0.7 10*3/uL (ref 0.1–1.0)
NEUTROS PCT: 61 % (ref 43–77)
Neutro Abs: 4.5 10*3/uL (ref 1.7–7.7)
Platelets: 218 10*3/uL (ref 150–400)
RBC: 4.92 MIL/uL (ref 4.22–5.81)
RDW: 14.7 % (ref 11.5–15.5)
WBC: 7.3 10*3/uL (ref 4.0–10.5)

## 2015-02-12 NOTE — Progress Notes (Signed)
   Subjective:    Patient ID: Calvin Gardner, male    DOB: Jun 25, 1951, 64 y.o.   MRN: [id number]  HPI He comes in all up of hepatitis C. 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 He started on [facility] with the thousand milligrams of ribavirin divided twice a day.  He reports excellent compliance with no missed doses. His post treatment initiation hemoglobin was 12 and dropped to 10.  His elastography is F2 and 3 but with some concerns for cirrhosis.  No completed treatment.    Review of Systems  Constitutional: Negative for activity change, appetite change and fatigue.  Gastrointestinal: Negative for nausea and diarrhea.  Skin: Negative for rash.  Neurological: Negative for dizziness and light-headedness.       Objective:   Physical Exam  Constitutional: He appears well-developed and well-nourished. No distress.  Eyes: No scleral icterus.  Cardiovascular: Normal rate, regular rhythm and normal heart sounds.   No murmur heard. Pulmonary/Chest: Effort normal and breath sounds normal. No respiratory distress.  Skin: No rash noted.          Assessment & Plan:

## 2015-02-12 NOTE — Assessment & Plan Note (Signed)
Some concerns noted on [place] but F2/3 on elastography and nl platelets and albumin.  Will recheck elastography in 1 year.

## 2015-02-12 NOTE — Assessment & Plan Note (Signed)
Now completed.  Will check end of treatment vl and then return in 4 months.

## 2015-02-13 LAB — HEPATITIS C RNA QUANTITATIVE: HCV Quantitative: NOT DETECTED IU/mL (ref <15)

## 2015-06-20 ENCOUNTER — Ambulatory Visit (INDEPENDENT_AMBULATORY_CARE_PROVIDER_SITE_OTHER): Payer: Medicaid Other | Admitting: Internal Medicine

## 2015-06-20 ENCOUNTER — Encounter: Payer: Self-pay | Admitting: Internal Medicine

## 2015-06-20 VITALS — BP 145/84 | HR 88 | Temp 98.0°F | Ht 66.0 in | Wt 140.1 lb

## 2015-06-20 DIAGNOSIS — B182 Chronic viral hepatitis C: Secondary | ICD-10-CM

## 2015-06-20 DIAGNOSIS — Z23 Encounter for immunization: Secondary | ICD-10-CM

## 2015-06-20 DIAGNOSIS — K7400 Hepatic fibrosis, unspecified: Secondary | ICD-10-CM

## 2015-06-20 NOTE — Progress Notes (Signed)
   Subjective:    Patient ID: Calvin Gardner, male    DOB: [birth year], 64 y.o.   MRN: [id number]  HPI He comes in all up of hepatitis C. 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 He started on [facility] with the thousand milligrams of ribavirin divided twice a day.  He reports excellent compliance with no missed doses. His post treatment initiation hemoglobin was 12 and dropped to 10.  His elastography is F2 and 3 but with some concerns for cirrhosis.  Now completed treatment In July 2016.  Here for SVR12.  Occasional beer but not daily    Review of Systems  Constitutional: Negative for activity change, appetite change and fatigue.  Gastrointestinal: Negative for nausea and diarrhea.  Skin: Negative for rash.  Neurological: Negative for dizziness and light-headedness.       Objective:   Physical Exam  Constitutional: He appears well-developed and well-nourished. No distress.  Eyes: No scleral icterus.  Cardiovascular: Normal rate, regular rhythm and normal heart sounds.   No murmur heard. Pulmonary/Chest: Effort normal and breath sounds normal. No respiratory distress.  Skin: No rash noted.          Assessment & Plan:

## 2015-06-20 NOTE — Assessment & Plan Note (Signed)
Lab today and considered cured if negative. Encouraged no alcohol.

## 2015-06-20 NOTE — Assessment & Plan Note (Signed)
Will recheck his elastography in about 6 months and return after that.

## 2015-06-23 LAB — HEPATITIS C RNA QUANTITATIVE: HCV Quantitative: NOT DETECTED IU/mL (ref <15)

## 2015-06-24 ENCOUNTER — Telehealth: Payer: Self-pay | Admitting: *Deleted

## 2015-06-24 NOTE — Telephone Encounter (Signed)
Relayed message

## 2015-06-24 NOTE — Telephone Encounter (Signed)
-----   Message from Thayer Headings, MD sent at 06/24/2015 12:08 PM EST ----- Please let him know that his HCV virus remains undetectable and now considered cured. thanks ----- Message -----    From: Lab in [facility] Interface    Sent: 06/23/2015   9:27 AM      To: Thayer Headings, MD

## 2015-09-30 ENCOUNTER — Telehealth: Payer: Self-pay

## 2015-09-30 NOTE — Telephone Encounter (Signed)
Returned patient phone call Patient not available Message left on voice mail to return our call 

## 2015-10-16 ENCOUNTER — Other Ambulatory Visit: Payer: Self-pay | Admitting: Internal Medicine

## 2015-10-29 ENCOUNTER — Ambulatory Visit: Payer: Medicaid Other | Attending: Family Medicine | Admitting: Family Medicine

## 2015-10-29 ENCOUNTER — Encounter: Payer: Self-pay | Admitting: Family Medicine

## 2015-10-29 ENCOUNTER — Encounter: Payer: Self-pay | Admitting: Clinical

## 2015-10-29 VITALS — BP 152/84 | HR 68 | Temp 98.5°F | Resp 16 | Ht 66.0 in | Wt 138.0 lb

## 2015-10-29 DIAGNOSIS — Z7982 Long term (current) use of aspirin: Secondary | ICD-10-CM | POA: Diagnosis not present

## 2015-10-29 DIAGNOSIS — R0789 Other chest pain: Secondary | ICD-10-CM

## 2015-10-29 DIAGNOSIS — F17200 Nicotine dependence, unspecified, uncomplicated: Secondary | ICD-10-CM

## 2015-10-29 DIAGNOSIS — R079 Chest pain, unspecified: Secondary | ICD-10-CM | POA: Insufficient documentation

## 2015-10-29 DIAGNOSIS — F17210 Nicotine dependence, cigarettes, uncomplicated: Secondary | ICD-10-CM | POA: Diagnosis not present

## 2015-10-29 DIAGNOSIS — IMO0001 Reserved for inherently not codable concepts without codable children: Secondary | ICD-10-CM

## 2015-10-29 DIAGNOSIS — Z720 Tobacco use: Secondary | ICD-10-CM

## 2015-10-29 DIAGNOSIS — Z0000 Encounter for general adult medical examination without abnormal findings: Secondary | ICD-10-CM | POA: Diagnosis not present

## 2015-10-29 DIAGNOSIS — I10 Essential (primary) hypertension: Secondary | ICD-10-CM

## 2015-10-29 LAB — POCT GLYCOSYLATED HEMOGLOBIN (HGB A1C): Hemoglobin A1C: 5.3

## 2015-10-29 MED ORDER — AMLODIPINE BESYLATE 10 MG PO TABS: 10.0000 mg | ORAL_TABLET | Freq: Every day | ORAL | Status: DC

## 2015-10-29 NOTE — Progress Notes (Signed)
Depression screen [facility] 2/9 10/29/2015 06/20/2015 02/12/2015 01/08/2015 12/11/2014  Decreased Interest 0 0 0 0 0  Down, Depressed, Hopeless 0 0 0 0 0  PHQ - 2 Score 0 0 0 0 0  Altered sleeping 0 - - - -  Tired, decreased energy 1 - - - -  Change in appetite 1 - - - -  Feeling bad or failure about yourself  0 - - - -  Trouble concentrating 0 - - - -  Moving slowly or fidgety/restless 0 - - - -  Suicidal thoughts 0 - - - -  PHQ-9 Score 2 - - - -    GAD 7 : Generalized Anxiety Score 10/29/2015  Nervous, Anxious, on Edge 0  Control/stop worrying 0  Worry too much - different things 0  Trouble relaxing 0  Restless 0  Afraid - awful might happen 0  Anxiety Difficulty Not difficult at all

## 2015-10-29 NOTE — Assessment & Plan Note (Signed)
Smoking, HTN Smoking cessation addressed  Patient confident he can quit

## 2015-10-29 NOTE — Patient Instructions (Addendum)
Quashon was seen today for hypertension.  Diagnoses and all orders for this visit:  Essential hypertension, benign -     amLODipine (NORVASC) 10 MG tablet; Take 1 tablet (10 mg total) by mouth daily.  Other chest pain -     DG Chest 2 View; Future  Healthcare maintenance -     HgB A1c    F/u in 6 weeks for HTN  Dr. Adrian Blackwater

## 2015-10-29 NOTE — Assessment & Plan Note (Signed)
A; R sided CP, suspect MSK pain  P: CXR Smoking cessation

## 2015-10-29 NOTE — Assessment & Plan Note (Signed)
A: HTN with SBP above goal Med: compliant P: Increase norvasc to 10 mg daily

## 2015-10-29 NOTE — Progress Notes (Signed)
F/U HTN Taking medication as prescribed  Tobacco user 6-7 per day  No suicidal thoughts in the past two weeks

## 2015-10-29 NOTE — Progress Notes (Signed)
Subjective:  Patient ID: Calvin Gardner, male    DOB: 08/06/1950  Age: 65 y.o. MRN: [id number]  CC: Hypertension   HPI Calvin Gardner presents for   1. CHRONIC HYPERTENSION  Disease Monitoring  Blood pressure range: not checking   Chest pain: yes  Dyspnea: no   Claudication: no   Medication compliance: yes  Medication Side Effects  Lightheadedness: no   Urinary frequency: no   Edema: no      Exercises: he rides his bike daily and walks.  2. Chest pain: R sided comes and goes. X 2 months. No SOB, swelling, dizziness or lightheadedness. He is a smoker. Smoking 5-7 cigs per day.   Social History  Substance Use Topics  . Smoking status: Current Some Day Smoker    Types: Cigarettes  . Smokeless tobacco: Never Used     Comment: trying to quit  . Alcohol Use: 0.0 oz/week    0 Standard drinks or equivalent per week     Comment: occasional    Outpatient Prescriptions Prior to Visit  Medication Sig Dispense Refill  . amLODipine (NORVASC) 5 MG tablet TAKE 1 TABLET (5 MG TOTAL) BY MOUTH DAILY. 30 tablet 0  . aspirin EC 81 MG tablet Take 81 mg by mouth every morning.      No facility-administered medications prior to visit.    ROS Review of Systems  Constitutional: Negative for fever, chills, fatigue and unexpected weight change.  Eyes: Negative for visual disturbance.  Respiratory: Negative for cough and shortness of breath.   Cardiovascular: Negative for chest pain, palpitations and leg swelling.  Gastrointestinal: Negative for nausea, vomiting, abdominal pain, diarrhea, constipation and blood in stool.  Endocrine: Negative for polydipsia, polyphagia and polyuria.  Musculoskeletal: Negative for myalgias, back pain, arthralgias, gait problem and neck pain.  Skin: Negative for rash.  Allergic/Immunologic: Negative for immunocompromised state.  Hematological: Negative for adenopathy. Does not bruise/bleed easily.  Psychiatric/Behavioral: Negative for suicidal  ideas, sleep disturbance and dysphoric mood. The patient is not nervous/anxious.     Objective:  BP 152/84 mmHg  Pulse 68  Temp(Src) 98.5 F (36.9 C)  Resp 16  Ht 5\' 6"  (1.676 m)  Wt 138 lb (62.596 kg)  BMI 22.28 kg/m2  SpO2 100%  BP/Weight 10/29/2015 06/20/2015 [id number]  Systolic BP 0000000 [id number] 0000000  Diastolic BP 84 84 96  Wt. (Lbs) 138 140.1 137  BMI 22.28 22.62 22.12   Physical Exam  Constitutional: He appears well-developed and well-nourished. No distress.  HENT:  Head: Normocephalic and atraumatic.  Neck: Normal range of motion. Neck supple.  Cardiovascular: Normal rate, regular rhythm, normal heart sounds and intact distal pulses.   Pulmonary/Chest: Effort normal and breath sounds normal.  Musculoskeletal: He exhibits no edema.  Neurological: He is alert.  Skin: Skin is warm and dry. No rash noted. No erythema.  Psychiatric: He has a normal mood and affect.   Lab Results  Component Value Date   HGBA1C 5.3 10/29/2015    Assessment & Plan:   Pleas was seen today for hypertension.  Diagnoses and all orders for this visit:  Essential hypertension, benign -     amLODipine (NORVASC) 10 MG tablet; Take 1 tablet (10 mg total) by mouth daily.  Other chest pain -     DG Chest 2 View; Future  Healthcare maintenance -     HgB A1c   Meds ordered this encounter  Medications  . amLODipine (NORVASC) 10 MG tablet    Sig: Take  1 tablet (10 mg total) by mouth daily.    Dispense:  30 tablet    Refill:  5    Follow-up: No Follow-up on file.   Boykin Nearing MD

## 2015-12-19 ENCOUNTER — Encounter: Payer: Self-pay | Admitting: Internal Medicine

## 2015-12-19 ENCOUNTER — Ambulatory Visit (INDEPENDENT_AMBULATORY_CARE_PROVIDER_SITE_OTHER): Payer: Medicaid Other | Admitting: Internal Medicine

## 2015-12-19 VITALS — BP 144/82 | HR 89 | Temp 97.9°F | Wt 137.0 lb

## 2015-12-19 DIAGNOSIS — K7400 Hepatic fibrosis, unspecified: Secondary | ICD-10-CM

## 2015-12-19 DIAGNOSIS — B182 Chronic viral hepatitis C: Secondary | ICD-10-CM

## 2015-12-19 NOTE — Progress Notes (Signed)
   Subjective:    Patient ID: Calvin Gardner, male    DOB: 24-Sep-1950, 65 y.o.   MRN: [id number]  HPI He comes in all up of hepatitis C. 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 He completed [facility] Lesches with the thousand milligrams of ribavirin divided twice a day and his post treatment initiation hemoglobin was 12 and dropped to 10.  His elastography is F2 and 3 but with some concerns for cirrhosis.  Now completed treatment last July 2016.  SVR12 undetectable and considered cured.  Occasional beer but not daily.  Understands that he should stop.     Review of Systems  Constitutional: Negative for activity change, appetite change and fatigue.  Gastrointestinal: Negative for nausea and diarrhea.  Skin: Negative for rash.  Neurological: Negative for dizziness and light-headedness.       Objective:   Physical Exam  Constitutional: He appears well-developed and well-nourished. No distress.  Eyes: No scleral icterus.  Cardiovascular: Normal rate, regular rhythm and normal heart sounds.   No murmur heard. Pulmonary/Chest: Effort normal and breath sounds normal. No respiratory distress.  Skin: No rash noted.          Assessment & Plan:

## 2015-12-19 NOTE — Assessment & Plan Note (Signed)
Now considered cured.  Ab will always remain positive.

## 2015-12-19 NOTE — Assessment & Plan Note (Signed)
Will recheck elastography to see if there are any concerns and base HCC screening needs on this.  He will return in 6 months if his elastography is up.

## 2016-06-10 NOTE — Progress Notes (Signed)
Counselor did not meet with patient This encounter was created in error - please disregard.  This encounter was created in error - please disregard.

## 2016-07-01 ENCOUNTER — Telehealth: Payer: Self-pay | Admitting: Family Medicine

## 2016-07-01 DIAGNOSIS — I10 Essential (primary) hypertension: Secondary | ICD-10-CM

## 2016-07-01 NOTE — Telephone Encounter (Signed)
Patient called the office to request medication refill for amLODipine (NORVASC) 10 MG tablet. Please call rx to [facility] on [address].   Thank you.

## 2016-07-02 MED ORDER — AMLODIPINE BESYLATE 10 MG PO TABS: 10.0000 mg | ORAL_TABLET | Freq: Every day | ORAL | 0 refills | Status: DC

## 2016-07-02 NOTE — Telephone Encounter (Signed)
Requested medication refilled - patient needs office visit for future refills.

## 2016-08-03 ENCOUNTER — Other Ambulatory Visit: Payer: Self-pay | Admitting: Family Medicine

## 2016-08-03 DIAGNOSIS — I10 Essential (primary) hypertension: Secondary | ICD-10-CM

## 2016-08-05 ENCOUNTER — Other Ambulatory Visit: Payer: Self-pay | Admitting: Family Medicine

## 2016-08-05 DIAGNOSIS — I10 Essential (primary) hypertension: Secondary | ICD-10-CM

## 2016-08-07 ENCOUNTER — Other Ambulatory Visit: Payer: Self-pay | Admitting: Family Medicine

## 2016-08-07 DIAGNOSIS — I10 Essential (primary) hypertension: Secondary | ICD-10-CM

## 2016-09-07 ENCOUNTER — Other Ambulatory Visit: Payer: Self-pay | Admitting: Family Medicine

## 2016-09-07 DIAGNOSIS — I10 Essential (primary) hypertension: Secondary | ICD-10-CM

## 2016-12-16 ENCOUNTER — Encounter: Payer: Self-pay | Admitting: Family Medicine

## 2017-02-28 ENCOUNTER — Encounter (HOSPITAL_COMMUNITY): Payer: Self-pay | Admitting: Emergency Medicine

## 2017-02-28 ENCOUNTER — Emergency Department (HOSPITAL_COMMUNITY)
Admission: EM | Admit: 2017-02-28 | Discharge: 2017-02-28 | Disposition: A | Discharge status: Home / Self Care | Payer: Medicaid Other | Attending: Emergency Medicine | Admitting: Emergency Medicine

## 2017-02-28 ENCOUNTER — Emergency Department (HOSPITAL_COMMUNITY): Payer: Medicaid Other

## 2017-02-28 DIAGNOSIS — F17210 Nicotine dependence, cigarettes, uncomplicated: Secondary | ICD-10-CM | POA: Insufficient documentation

## 2017-02-28 DIAGNOSIS — R066 Hiccough: Secondary | ICD-10-CM | POA: Diagnosis not present

## 2017-02-28 DIAGNOSIS — I10 Essential (primary) hypertension: Secondary | ICD-10-CM | POA: Diagnosis not present

## 2017-02-28 LAB — COMPREHENSIVE METABOLIC PANEL
ALK PHOS: 50 U/L (ref 38–126)
ALT: 44 U/L (ref 17–63)
AST: 89 U/L — ABNORMAL HIGH (ref 15–41)
Albumin: 4.4 g/dL (ref 3.5–5.0)
Anion gap: 11 (ref 5–15)
BILIRUBIN TOTAL: 0.9 mg/dL (ref 0.3–1.2)
BUN: 5 mg/dL — ABNORMAL LOW (ref 6–20)
CALCIUM: 9.4 mg/dL (ref 8.9–10.3)
CO2: 20 mmol/L — ABNORMAL LOW (ref 22–32)
Chloride: 104 mmol/L (ref 101–111)
Creatinine, Ser: 0.77 mg/dL (ref 0.61–1.24)
GFR calc Af Amer: >60 mL/min (ref >60)
GFR calc non Af Amer: >60 mL/min (ref >60)
Glucose, Bld: 94 mg/dL (ref 65–99)
Potassium: 5.3 mmol/L — ABNORMAL HIGH (ref 3.5–5.1)
Sodium: 135 mmol/L (ref 135–145)
Total Protein: 7.3 g/dL (ref 6.5–8.1)

## 2017-02-28 LAB — CBC WITH DIFFERENTIAL/PLATELET
Basophils Absolute: 0 10*3/uL (ref 0.0–0.1)
Basophils Relative: 0 %
Eosinophils Absolute: 0.2 10*3/uL (ref 0.0–0.7)
Eosinophils Relative: 2 %
HEMATOCRIT: 40.1 % (ref 39.0–52.0)
HEMOGLOBIN: 13.7 g/dL (ref 13.0–17.0)
Lymphocytes Relative: 25 %
Lymphs Abs: 1.9 10*3/uL (ref 0.7–4.0)
MCH: 29.5 pg (ref 26.0–34.0)
MCHC: 34.2 g/dL (ref 30.0–36.0)
MCV: 86.4 fL (ref 78.0–100.0)
Monocytes Absolute: 0.8 10*3/uL (ref 0.1–1.0)
Monocytes Relative: 11 %
NEUTROS PCT: 62 %
Neutro Abs: 4.6 10*3/uL (ref 1.7–7.7)
Platelets: 229 10*3/uL (ref 150–400)
RBC: 4.64 MIL/uL (ref 4.22–5.81)
RDW: 13.1 % (ref 11.5–15.5)
WBC: 7.5 10*3/uL (ref 4.0–10.5)

## 2017-02-28 LAB — I-STAT TROPONIN, ED: Troponin i, poc: 0 ng/mL (ref 0.00–0.08)

## 2017-02-28 MED ORDER — CHLORPROMAZINE HCL 25 MG PO TABS
25.0000 mg | ORAL_TABLET | Freq: Once | ORAL | Status: AC
Start: 2017-02-28 — End: 2017-02-28
  Administered 2017-02-28: 25 mg via ORAL
  Filled 2017-02-28: qty 1

## 2017-02-28 MED ORDER — CHLORPROMAZINE HCL 25 MG PO TABS: 25.0000 mg | ORAL_TABLET | Freq: Three times a day (TID) | ORAL | 0 refills | Status: DC | PRN

## 2017-02-28 MED ORDER — PREDNISONE 20 MG PO TABS: 20.0000 mg | ORAL_TABLET | Freq: Every day | ORAL | 0 refills | Status: AC

## 2017-02-28 MED ORDER — SODIUM CHLORIDE 0.9 % IV BOLUS (SEPSIS)
1000.0000 mL | Freq: Once | INTRAVENOUS | Status: AC
  Administered 2017-02-28: 1000 mL via INTRAVENOUS

## 2017-02-28 NOTE — ED Provider Notes (Signed)
Emergency Department Provider Note   I have reviewed the triage vital signs and the nursing notes.   HISTORY  Chief Complaint Hiccups   HPI Calvin Gardner is a 66 y.o. male with PMH of HTN, Hep C with liver fibrosis, and tobacco use presents to the emergency department for evaluation of hiccups intermittently over the past 2 months. The patient reports daily hiccup episodes lasting approximately 30 minutes to one hour each. They resolve spontaneously and then return 30 minutes to an hour later. Patient is not found any exacerbating or alleviating factors. He notes some associated chest discomfort mostly when having hiccups. Denies shortness of breath. No abdominal discomfort, nausea, vomiting, diarrhea. No changes to medications. The patient does drink daily. He has tried various home remedies to alleviate hiccups with no relief.    Past Medical History:  Diagnosis Date  . Cataract   . Hypertension     Patient Active Problem List   Diagnosis Date Noted  . Chest pain 10/29/2015  . Liver fibrosis ([place]) 10/23/2014  . Chronic hepatitis C without hepatic coma ([place]) 09/19/2014  . IFG (impaired fasting glucose) 04/02/2014  . Unspecified vitamin D deficiency 04/02/2014  . Abnormal LFTs 04/02/2014  . Cataract, right eye 01/05/2014  . Essential hypertension, benign 01/05/2014  . Smoking 01/05/2014    Past Surgical History:  Procedure Laterality Date  . COLONOSCOPY WITH PROPOFOL N/A 01/30/2014   Procedure: COLONOSCOPY WITH PROPOFOL;  Surgeon: Garlan Fair, MD;  Location: [facility];  Service: Endoscopy;  Laterality: N/A;  . ESOPHAGOGASTRODUODENOSCOPY (EGD) WITH PROPOFOL N/A 01/30/2014   Procedure: ESOPHAGOGASTRODUODENOSCOPY (EGD) WITH PROPOFOL;  Surgeon: Garlan Fair, MD;  Location: [facility];  Service: Endoscopy;  Laterality: N/A;  . left jaw surgery     . MANDIBLE FRACTURE SURGERY      Current Outpatient Rx  . Order #: [id number] Class: Normal  . Order #:  [id number] Class: Historical Med  . Order #: [id number] Class: Print  . Order #: [id number] Class: Historical Med  . Order #: [id number] Class: Print    Allergies Patient has no known allergies.  No family history on file.  Social History Social History  Substance Use Topics  . Smoking status: Current Some Day Smoker    Types: Cigarettes  . Smokeless tobacco: Never Used     Comment: trying to quit  . Alcohol use 0.0 oz/week     Comment: occasional    Review of Systems  Constitutional: No fever/chills Eyes: No visual changes. ENT: No sore throat. Cardiovascular: Positive chest pain with hiccup episodes.  Respiratory: Denies shortness of breath. Gastrointestinal: No abdominal pain.  No nausea, no vomiting.  No diarrhea.  No constipation. Positive hiccups.  Genitourinary: Negative for dysuria. Musculoskeletal: Negative for back pain. Skin: Negative for rash. Neurological: Negative for headaches, focal weakness or numbness.  10-point ROS otherwise negative.  ____________________________________________   PHYSICAL EXAM:  VITAL SIGNS: ED Triage Vitals [02/28/17 1620]  Enc Vitals Group     BP (!) 158/104     Pulse Rate 99     Resp 16     Temp 98.1 F (36.7 C)     Temp Source Oral     SpO2 97 %     Weight 130 lb (59 kg)     Height 5\' 6"  (1.676 m)   Constitutional: Alert and oriented. Well appearing and in no acute distress. Eyes: Conjunctivae are normal.  Head: Atraumatic. Nose: No congestion/rhinnorhea. Mouth/Throat: Mucous membranes are moist.  Oropharynx non-erythematous. Neck: No stridor.  Cardiovascular: Normal rate, regular rhythm. Good peripheral circulation. Grossly normal heart sounds.   Respiratory: Normal respiratory effort.  No retractions. Lungs CTAB. Gastrointestinal: Soft and nontender. No distention.  Musculoskeletal: No lower extremity tenderness nor edema. No gross deformities of extremities. Neurologic:  Normal speech and language. No gross focal  neurologic deficits are appreciated.  Skin:  Skin is warm, dry and intact. No rash noted. Psychiatric: Mood and affect are normal. Speech and behavior are normal.  ____________________________________________   LABS (all labs ordered are listed, but only abnormal results are displayed)  Labs Reviewed  COMPREHENSIVE METABOLIC PANEL - Abnormal; Notable for the following:       Result Value   Potassium 5.3 (*)    CO2 20 (*)    BUN 5 (*)    AST 89 (*)    All other components within normal limits  CBC WITH DIFFERENTIAL/PLATELET  I-STAT TROPONIN, ED   ____________________________________________  EKG  EKG reviewed. No STEMI.  ____________________________________________  RADIOLOGY  Dg Chest 2 View  Result Date: 02/28/2017 CLINICAL DATA:  Hiccups.  Shortness of breath and chest tightness. EXAM: CHEST  2 VIEW COMPARISON:  November 02, 2013 FINDINGS: Bilateral nipple shadows are identified. The heart, hila, and mediastinum are normal. No suspicious nodules, masses, or infiltrates. IMPRESSION: No active cardiopulmonary disease. Electronically Signed   By: Dorise Bullion III M.D   On: 02/28/2017 18:17    ____________________________________________   PROCEDURES  Procedure(s) performed:   Procedures  None ____________________________________________   INITIAL IMPRESSION / ASSESSMENT AND PLAN / ED COURSE  Pertinent labs & imaging results that were available during my care of the patient were reviewed by me and considered in my medical decision making (see chart for details).  Patient presents to the emergency department for evaluation of hiccups that have occurred intermittently over the past 2 months. No active hiccups at this time but was having some of the waiting room. Patient is a daily drinker with known history of liver disease. He has some associated chest pain mostly with a couple episodes but some lingering discomfort afterwards at times. No active chest pain. Plan for  labs including troponin, EKG, and chest x-ray and workup of chest pain and hiccups. Plan to treat with Thorazine and IV fluids.   Patient with continued intermittent hiccups. Plan for discharge to continue Thorazine and have PCP follow up appointment. Will start a brief steroid burst.   At this time, I do not feel there is any life-threatening condition present. I have reviewed and discussed all results (EKG, imaging, lab, urine as appropriate), exam findings with patient. I have reviewed nursing notes and appropriate previous records.  I feel the patient is safe to be discharged home without further emergent workup. Discussed usual and customary return precautions. Patient and family (if present) verbalize understanding and are comfortable with this plan.  Patient will follow-up with their primary care provider. If they do not have a primary care provider, information for follow-up has been provided to them. All questions have been answered.  ____________________________________________  FINAL CLINICAL IMPRESSION(S) / ED DIAGNOSES  Final diagnoses:  Hiccups     MEDICATIONS GIVEN DURING THIS VISIT:  Medications  sodium chloride 0.9 % bolus 1,000 mL (0 mLs Intravenous Stopped 02/28/17 1926)  chlorproMAZINE (THORAZINE) tablet 25 mg (25 mg Oral Given 02/28/17 1927)     NEW OUTPATIENT MEDICATIONS STARTED DURING THIS VISIT:  Discharge Medication List as of 02/28/2017  8:13 PM    START taking these medications  Details  chlorproMAZINE (THORAZINE) 25 MG tablet Take 1 tablet (25 mg total) by mouth 3 (three) times daily as needed for hiccoughs., Starting Sun 02/28/2017, Print    predniSONE (DELTASONE) 20 MG tablet Take 1 tablet (20 mg total) by mouth daily., Starting Sun 02/28/2017, Until Thu 03/04/2017, Print         Note:  This document was prepared using Dragon voice recognition software and may include unintentional dictation errors.  Nanda Quinton, MD Emergency Medicine    Long, Wonda Old[facility], MD 03/01/17 [phone]

## 2017-02-28 NOTE — ED Triage Notes (Signed)
Pt to triage via [facility] for hiccups x 2 months.

## 2017-02-28 NOTE — Discharge Instructions (Signed)
You were seen in the ED today with hiccups. Take the medication as directed and follow up with your PCP in the coming week.

## 2017-03-12 ENCOUNTER — Encounter: Payer: Self-pay | Admitting: Family Medicine

## 2017-03-12 ENCOUNTER — Ambulatory Visit: Payer: Medicaid Other | Attending: Family Medicine | Admitting: Family Medicine

## 2017-03-12 VITALS — BP 130/69 | HR 79 | Temp 97.5°F | Ht 66.0 in | Wt 129.6 lb

## 2017-03-12 DIAGNOSIS — Z7982 Long term (current) use of aspirin: Secondary | ICD-10-CM | POA: Insufficient documentation

## 2017-03-12 DIAGNOSIS — Z79899 Other long term (current) drug therapy: Secondary | ICD-10-CM | POA: Diagnosis not present

## 2017-03-12 DIAGNOSIS — I10 Essential (primary) hypertension: Secondary | ICD-10-CM | POA: Insufficient documentation

## 2017-03-12 DIAGNOSIS — K7460 Unspecified cirrhosis of liver: Secondary | ICD-10-CM | POA: Insufficient documentation

## 2017-03-12 DIAGNOSIS — B182 Chronic viral hepatitis C: Secondary | ICD-10-CM | POA: Diagnosis not present

## 2017-03-12 DIAGNOSIS — K7400 Hepatic fibrosis, unspecified: Secondary | ICD-10-CM

## 2017-03-12 DIAGNOSIS — E875 Hyperkalemia: Secondary | ICD-10-CM | POA: Insufficient documentation

## 2017-03-12 DIAGNOSIS — Z9889 Other specified postprocedural states: Secondary | ICD-10-CM | POA: Insufficient documentation

## 2017-03-12 MED ORDER — AMLODIPINE BESYLATE 10 MG PO TABS: 10.0000 mg | ORAL_TABLET | Freq: Every day | ORAL | 6 refills | Status: DC

## 2017-03-12 MED FILL — AMLODIPINE BESYLATE 10 MG T: 10 | 30 days supply | Qty: 30 | Fill #0

## 2017-03-12 NOTE — Patient Instructions (Signed)

## 2017-03-12 NOTE — Progress Notes (Signed)
Subjective:  Patient ID: Calvin Gardner, male    DOB: [birth year]  Age: 66 y.o. MRN: [id number]  CC: Hypertension   HPI Calvin Gardner is a 66 year old male with a history of hypertension, chronic hepatitis C (completed treatment in 02/2015), liver cirrhosis who presents today to establish care with me as he was previously followed by Dr. Adrian Blackwater.  He was seen at the ED for hiccups 2 weeks ago and was prescribed Thorazine which he was never able to obtain from the pharmacy. He informs me hiccups have ceased at this time. Labs at the ED revealed hyperkalemia 5.3.  He has been out of his antihypertensives for the last 2 months and is requesting refills. He has no chest pains, shortness of breath, pedal edema and has no additional concerns today. He would like his refills to be sent to the pharmacy in-house.  Past Medical History:  Diagnosis Date  . Cataract   . Hypertension     Past Surgical History:  Procedure Laterality Date  . COLONOSCOPY WITH PROPOFOL N/A 01/30/2014   Procedure: COLONOSCOPY WITH PROPOFOL;  Surgeon: Garlan Fair, MD;  Location: [facility];  Service: Endoscopy;  Laterality: N/A;  . ESOPHAGOGASTRODUODENOSCOPY (EGD) WITH PROPOFOL N/A 01/30/2014   Procedure: ESOPHAGOGASTRODUODENOSCOPY (EGD) WITH PROPOFOL;  Surgeon: Garlan Fair, MD;  Location: [facility];  Service: Endoscopy;  Laterality: N/A;  . left jaw surgery     . MANDIBLE FRACTURE SURGERY      No Known Allergies   Outpatient Medications Prior to Visit  Medication Sig Dispense Refill  . aspirin EC 81 MG tablet Take 81 mg by mouth every morning. Reported on 12/19/2015    . chlorproMAZINE (THORAZINE) 25 MG tablet Take 1 tablet (25 mg total) by mouth 3 (three) times daily as needed for hiccoughs. (Patient not taking: Reported on 03/12/2017) 20 tablet 0  . cholecalciferol (VITAMIN D) 400 units TABS tablet Take 400 Units by mouth. Reported on 12/19/2015    . amLODipine (NORVASC) 10 MG tablet TAKE 1  TABLET (10 MG TOTAL) BY MOUTH DAILY. (Patient not taking: Reported on 03/12/2017) 30 tablet 0   No facility-administered medications prior to visit.     ROS Review of Systems  Constitutional: Negative for activity change and appetite change.  HENT: Negative for sinus pressure and sore throat.   Eyes: Negative for visual disturbance.  Respiratory: Negative for cough, chest tightness and shortness of breath.   Cardiovascular: Negative for chest pain and leg swelling.  Gastrointestinal: Negative for abdominal distention, abdominal pain, constipation and diarrhea.  Endocrine: Negative.   Genitourinary: Negative for dysuria.  Musculoskeletal: Negative for joint swelling and myalgias.  Skin: Negative for rash.  Allergic/Immunologic: Negative.   Neurological: Negative for weakness, light-headedness and numbness.  Psychiatric/Behavioral: Negative for dysphoric mood and suicidal ideas.    Objective:  BP 130/69   Pulse 79   Temp (!) 97.5 F (36.4 C) (Oral)   Ht _0  (1.676 m)   Wt 129 lb 9.6 oz (58.8 kg)   SpO2 99%   BMI 20.92 kg/m   BP/Weight 03/12/2017 02/28/2017 8/65/7846  Systolic BP 962 952 841  Diastolic BP 69 324 82  Wt. (Lbs) 129.6 130 137  BMI 20.92 20.98 22.12      Physical Exam  Constitutional: He is oriented to person, place, and time. He appears well-developed and well-nourished.  Cardiovascular: Normal rate, normal heart sounds and intact distal pulses.   No murmur heard. Pulmonary/Chest: Effort normal and breath sounds normal. He  has no wheezes. He has no rales. He exhibits no tenderness.  Abdominal: Soft. Bowel sounds are normal. He exhibits no distension and no mass. There is no tenderness.  Musculoskeletal: Normal range of motion.  Neurological: He is alert and oriented to person, place, and time.    CMP Latest Ref Rng & Units 02/28/2017 11/26/2014 07/06/2014  Glucose 65 - 99 mg/dL 94 77 60(L)  BUN 6 - 20 mg/dL 5(L) 6 10  Creatinine 0.61 - 1.24 mg/dL 0.77  0.87 0.80  Sodium 135 - 145 mmol/L 135 133(L) 135  Potassium 3.5 - 5.1 mmol/L 5.3(H) 4.4 4.6  Chloride 101 - 111 mmol/L 104 102 101  CO2 22 - 32 mmol/L 20(L) 22 26  Calcium 8.9 - 10.3 mg/dL 9.4 9.5 9.8  Total Protein 6.5 - 8.1 g/dL 7.3 6.9 7.7  Total Bilirubin 0.3 - 1.2 mg/dL 0.9 1.0 0.4  Alkaline Phos 38 - 126 U/L 50 56 48  AST 15 - 41 U/L 89(H) 16 96(H)  ALT 17 - 63 U/L 44 8 53    Assessment & Plan:   1. Essential hypertension, benign Controlled - amLODipine (NORVASC) 10 MG tablet; Take 1 tablet (10 mg total) by mouth daily.  Dispense: 30 tablet; Refill: 6 - Lipid panel - CMP14+EGFR  2. Liver fibrosis ([facility]) Mild liver cirrhosis as per ultrasound from 10/2014 Advised against alcohol consumption  3. Hyperkalemia Last potassium was 5.3 We'll repeat  4. Chronic hepatitis C without hepatic coma ([place]) Completed treatment.   Meds ordered this encounter  Medications  . amLODipine (NORVASC) 10 MG tablet    Sig: Take 1 tablet (10 mg total) by mouth daily.    Dispense:  30 tablet    Refill:  6    Must have office visit for refills - last fill.    Follow-up: Return in about 6 months (around 09/12/2017) for Follow-up of chronic medical conditions.   Arnoldo Morale MD

## 2017-03-12 NOTE — Progress Notes (Signed)
Pt has not had BP med in 2 months.

## 2017-03-16 ENCOUNTER — Ambulatory Visit: Payer: Medicaid Other

## 2017-03-18 ENCOUNTER — Ambulatory Visit: Payer: Medicaid Other | Attending: Family Medicine

## 2017-03-18 DIAGNOSIS — I10 Essential (primary) hypertension: Secondary | ICD-10-CM | POA: Diagnosis not present

## 2017-03-18 NOTE — Progress Notes (Signed)
Patient here for lab visit only 

## 2017-03-19 LAB — CMP14+EGFR
A/G RATIO: 1.8 (ref 1.2–2.2)
ALBUMIN: 4.4 g/dL (ref 3.6–4.8)
ALK PHOS: 48 IU/L (ref 39–117)
ALT: 32 IU/L (ref 0–44)
AST: 45 IU/L — AB (ref 0–40)
BUN / CREAT RATIO: 7 — AB (ref 10–24)
BUN: 6 mg/dL — ABNORMAL LOW (ref 8–27)
Bilirubin Total: 0.9 mg/dL (ref 0.0–1.2)
CHLORIDE: 99 mmol/L (ref 96–106)
CO2: 20 mmol/L (ref 20–29)
CREATININE: 0.85 mg/dL (ref 0.76–1.27)
Calcium: 10 mg/dL (ref 8.6–10.2)
GFR calc Af Amer: 106 mL/min/{1.73_m2} (ref >59)
GFR, EST NON AFRICAN AMERICAN: 92 mL/min/{1.73_m2} (ref >59)
GLOBULIN, TOTAL: 2.5 g/dL (ref 1.5–4.5)
Glucose: 100 mg/dL — ABNORMAL HIGH (ref 65–99)
POTASSIUM: 4.3 mmol/L (ref 3.5–5.2)
SODIUM: 135 mmol/L (ref 134–144)
Total Protein: 6.9 g/dL (ref 6.0–8.5)

## 2017-03-19 LAB — LIPID PANEL
Chol/HDL Ratio: 2.2 ratio (ref 0.0–5.0)
Cholesterol, Total: 156 mg/dL (ref 100–199)
HDL: 72 mg/dL (ref >39)
LDL Calculated: 74 mg/dL (ref 0–99)
Triglycerides: 52 mg/dL (ref 0–149)
VLDL CHOLESTEROL CAL: 10 mg/dL (ref 5–40)

## 2017-03-22 ENCOUNTER — Telehealth: Payer: Self-pay

## 2017-03-22 NOTE — Telephone Encounter (Signed)
Pt phone number is currently not working.

## 2017-04-20 MED FILL — AMLODIPINE BESYLATE 10 MG T: 10 | 30 days supply | Qty: 30 | Fill #1

## 2017-05-27 MED FILL — AMLODIPINE BESYLATE 10 MG T: 10 | 30 days supply | Qty: 30 | Fill #2

## 2017-06-11 ENCOUNTER — Ambulatory Visit: Payer: Medicaid Other | Attending: Family Medicine | Admitting: Family Medicine

## 2017-06-11 ENCOUNTER — Encounter: Payer: Self-pay | Admitting: Family Medicine

## 2017-06-11 VITALS — BP 118/72 | HR 72 | Temp 97.8°F | Ht 66.0 in | Wt 126.4 lb

## 2017-06-11 DIAGNOSIS — B182 Chronic viral hepatitis C: Secondary | ICD-10-CM | POA: Diagnosis not present

## 2017-06-11 DIAGNOSIS — K7400 Hepatic fibrosis, unspecified: Secondary | ICD-10-CM

## 2017-06-11 DIAGNOSIS — Z23 Encounter for immunization: Secondary | ICD-10-CM | POA: Diagnosis not present

## 2017-06-11 DIAGNOSIS — R066 Hiccough: Secondary | ICD-10-CM | POA: Insufficient documentation

## 2017-06-11 DIAGNOSIS — R05 Cough: Secondary | ICD-10-CM | POA: Insufficient documentation

## 2017-06-11 DIAGNOSIS — I10 Essential (primary) hypertension: Secondary | ICD-10-CM | POA: Diagnosis not present

## 2017-06-11 DIAGNOSIS — K7460 Unspecified cirrhosis of liver: Secondary | ICD-10-CM | POA: Insufficient documentation

## 2017-06-11 DIAGNOSIS — Z7982 Long term (current) use of aspirin: Secondary | ICD-10-CM | POA: Diagnosis not present

## 2017-06-11 DIAGNOSIS — R0982 Postnasal drip: Secondary | ICD-10-CM

## 2017-06-11 DIAGNOSIS — Z79899 Other long term (current) drug therapy: Secondary | ICD-10-CM | POA: Insufficient documentation

## 2017-06-11 DIAGNOSIS — H269 Unspecified cataract: Secondary | ICD-10-CM | POA: Diagnosis not present

## 2017-06-11 MED ORDER — CHLORPROMAZINE HCL 25 MG PO TABS: 25.0000 mg | ORAL_TABLET | Freq: Two times a day (BID) | ORAL | 1 refills | Status: DC | PRN

## 2017-06-11 MED ORDER — CETIRIZINE HCL 10 MG PO TABS: 10.0000 mg | ORAL_TABLET | Freq: Every day | ORAL | 1 refills | Status: DC

## 2017-06-11 MED FILL — ALL DAY ALLERGY 10 MG TAB: 10 | 30 days supply | Qty: 30 | Fill #0

## 2017-06-11 NOTE — Progress Notes (Signed)
Subjective:  Patient ID: Calvin Gardner, male    DOB: [birth year]  Age: 66 y.o. MRN: [id number]  CC: Hypertension   HPI Calvin Gardner is a 66 year old male with a history of hypertension, chronic hepatitis C (completed treatment in 02/2015), liver cirrhosis who presents today for a follow-up visit.  He complains of persisting hiccups and was never able to pick up his Thorazine which he was prescribed from the ED due to the high cost at [facility].  He hiccups to the point where he sometimes vomits. Symptoms have been persistent for the last 3 months.  He also complains of cough which wakes him up from his sleep and sometimes finds himself gagging.  He endorses postnasal drip but denies sinus tenderness or facial pain.  He has been compliant with his antihypertensive which he tolerates without any side effects.  Past Medical History:  Diagnosis Date  . Cataract   . Hypertension     Past Surgical History:  Procedure Laterality Date  . left jaw surgery     . MANDIBLE FRACTURE SURGERY      No Known Allergies   Outpatient Medications Prior to Visit  Medication Sig Dispense Refill  . amLODipine (NORVASC) 10 MG tablet Take 1 tablet (10 mg total) by mouth daily. 30 tablet 6  . cholecalciferol (VITAMIN D) 400 units TABS tablet Take 400 Units by mouth. Reported on 12/19/2015    . aspirin EC 81 MG tablet Take 81 mg by mouth every morning. Reported on 12/19/2015    . chlorproMAZINE (THORAZINE) 25 MG tablet Take 1 tablet (25 mg total) by mouth 3 (three) times daily as needed for hiccoughs. (Patient not taking: Reported on 03/12/2017) 20 tablet 0   No facility-administered medications prior to visit.     ROS Review of Systems  Constitutional: Negative for activity change and appetite change.  HENT: Negative for sinus pressure and sore throat.   Eyes: Negative for visual disturbance.  Respiratory: Positive for cough. Negative for chest tightness and shortness of breath.   Cardiovascular:  Negative for chest pain and leg swelling.  Gastrointestinal: Negative for abdominal distention, abdominal pain, constipation and diarrhea.  Endocrine: Negative.   Genitourinary: Negative for dysuria.  Musculoskeletal: Negative for joint swelling and myalgias.  Skin: Negative for rash.  Allergic/Immunologic: Negative.   Neurological: Negative for weakness, light-headedness and numbness.  Psychiatric/Behavioral: Negative for dysphoric mood and suicidal ideas.    Objective:  BP 118/72   Pulse 72   Temp 97.8 F (36.6 C) (Oral)   Ht 5\' 6"  (1.676 m)   Wt 126 lb 6.4 oz (57.3 kg)   SpO2 100%   BMI 20.40 kg/m   BP/Weight 06/11/2017 03/12/2017 1/74/0814  Systolic BP 481 856 314  Diastolic BP 72 69 970  Wt. (Lbs) 126.4 129.6 130  BMI 20.4 20.92 20.98      Physical Exam  Constitutional: He is oriented to person, place, and time. He appears well-developed and well-nourished.  Cardiovascular: Normal rate, normal heart sounds and intact distal pulses.  No murmur heard. Pulmonary/Chest: Effort normal and breath sounds normal. He has no wheezes. He has no rales. He exhibits no tenderness.  Abdominal: Soft. Bowel sounds are normal. He exhibits no distension and no mass. There is no tenderness.  Musculoskeletal: Normal range of motion.  Neurological: He is alert and oriented to person, place, and time.  Skin: Skin is warm and dry.  Psychiatric: He has a normal mood and affect.     Assessment &  Plan:   1. Essential hypertension, benign Controlled Continue amlodipine Low sodium, DASH diet  2. Need for influenza vaccination - Flu Vaccine QUAD 36+ mos IM  3. Liver fibrosis (HCC) Hepatic steatosis and/or mild cirrhosis from abdominal ultrasound with the last sonography from 10/2014  4. Hiccups Uncontrolled due to the fact that he never picked up his Thorazine which I have refilled - chlorproMAZINE (THORAZINE) 25 MG tablet; Take 1 tablet (25 mg total) 2 (two) times daily as needed by  mouth for hiccoughs.  Dispense: 30 tablet; Refill: 1  5. Post-nasal drip Could explain his nighttime cough We will place an antihistamine - cetirizine (ZYRTEC) 10 MG tablet; Take 1 tablet (10 mg total) daily by mouth.  Dispense: 30 tablet; Refill: 1   Meds ordered this encounter  Medications  . chlorproMAZINE (THORAZINE) 25 MG tablet    Sig: Take 1 tablet (25 mg total) 2 (two) times daily as needed by mouth for hiccoughs.    Dispense:  30 tablet    Refill:  1  . cetirizine (ZYRTEC) 10 MG tablet    Sig: Take 1 tablet (10 mg total) daily by mouth.    Dispense:  30 tablet    Refill:  1    Follow-up: Return in about 3 months (around 09/11/2017) for follow up of hypertension.   Calvin Morale MD

## 2017-06-11 NOTE — Patient Instructions (Signed)
Hiccups A hiccup is the result of a sudden shortening of the muscle below your lungs (diaphragm). This movement of your diaphragm causes a sudden inhalation followed by the closing of your vocal cords, which causes the hiccup sound. Most people get the hiccups. Typically, hiccups last only a short amount of time. There are three types of hiccups:  Benign. These hiccups last less than 48 hours.  Persistent. These hiccups last more than 48 hours, but less than 1 month.  Intractable. These hiccups last more than 1 month.  A hiccup is a reflex. You cannot control reflexes. Follow these instructions at home: Watch your hiccups for any changes. The following actions may help to lessen any discomfort that you are feeling:  Eat small meals.  Limit alcohol intake to no more than 1 drink per day for nonpregnant women and 2 drinks per day for men. One drink equals 12 oz of beer, 5 oz of wine, or 1 oz of hard liquor.  Limit drinking carbonated or fizzy drinks, such as soda.  Eat and chew your food slowly.  Avoid eating or drinking hot or spicy foods and drinks.  Take medicines only as directed by your health care provider.  Contact a health care provider if:  Your hiccups last for more than 48 hours.  Your hiccups do not improve with treatment.  You cannot sleep or eat due to the hiccups.  You have unexpected weight loss due to the hiccups.  You have a fever.  You have trouble breathing or swallowing.  You develop severe pain in your abdomen.  You develop numbness, tingling, or weakness. This information is not intended to replace advice given to you by your health care provider. Make sure you discuss any questions you have with your health care provider. Document Released: 09/28/2001 Document Revised: 12/26/2015 Document Reviewed: 07/16/2014 Elsevier Interactive Patient Education  2018 Elsevier Inc.  

## 2017-06-16 ENCOUNTER — Other Ambulatory Visit: Payer: Self-pay | Admitting: Family Medicine

## 2017-06-16 MED ORDER — BACLOFEN 10 MG PO TABS: 10.0000 mg | ORAL_TABLET | Freq: Two times a day (BID) | ORAL | 0 refills | Status: DC | PRN

## 2017-06-16 MED FILL — BACLOFEN 10 MG TABLET: 10 | 15 days supply | Qty: 30 | Fill #0

## 2017-07-09 MED FILL — AMLODIPINE BESYLATE 10 MG T: 10 | 30 days supply | Qty: 30 | Fill #3

## 2017-08-25 MED FILL — CETIRIZINE HCL 10 MG TABS: 10 | 30 days supply | Qty: 30 | Fill #1

## 2017-08-25 MED FILL — AMLODIPINE BESYLATE 10 MG T: 10 | 30 days supply | Qty: 30 | Fill #4

## 2017-09-13 ENCOUNTER — Ambulatory Visit: Payer: Medicaid Other | Admitting: Family Medicine

## 2017-10-07 ENCOUNTER — Ambulatory Visit: Payer: Medicare Other | Attending: Family Medicine | Admitting: Family Medicine

## 2017-10-07 ENCOUNTER — Encounter: Payer: Self-pay | Admitting: Family Medicine

## 2017-10-07 VITALS — BP 153/93 | HR 91 | Temp 98.2°F | Ht 66.0 in | Wt 127.8 lb

## 2017-10-07 DIAGNOSIS — K7400 Hepatic fibrosis, unspecified: Secondary | ICD-10-CM

## 2017-10-07 DIAGNOSIS — H268 Other specified cataract: Secondary | ICD-10-CM

## 2017-10-07 DIAGNOSIS — H269 Unspecified cataract: Secondary | ICD-10-CM | POA: Insufficient documentation

## 2017-10-07 DIAGNOSIS — F17210 Nicotine dependence, cigarettes, uncomplicated: Secondary | ICD-10-CM | POA: Insufficient documentation

## 2017-10-07 DIAGNOSIS — R7301 Impaired fasting glucose: Secondary | ICD-10-CM

## 2017-10-07 DIAGNOSIS — Z9114 Patient's other noncompliance with medication regimen: Secondary | ICD-10-CM | POA: Diagnosis not present

## 2017-10-07 DIAGNOSIS — I10 Essential (primary) hypertension: Secondary | ICD-10-CM

## 2017-10-07 DIAGNOSIS — F17200 Nicotine dependence, unspecified, uncomplicated: Secondary | ICD-10-CM

## 2017-10-07 DIAGNOSIS — Z79899 Other long term (current) drug therapy: Secondary | ICD-10-CM | POA: Insufficient documentation

## 2017-10-07 DIAGNOSIS — R066 Hiccough: Secondary | ICD-10-CM | POA: Diagnosis not present

## 2017-10-07 DIAGNOSIS — E559 Vitamin D deficiency, unspecified: Secondary | ICD-10-CM | POA: Diagnosis not present

## 2017-10-07 DIAGNOSIS — F1010 Alcohol abuse, uncomplicated: Secondary | ICD-10-CM

## 2017-10-07 DIAGNOSIS — IMO0001 Reserved for inherently not codable concepts without codable children: Secondary | ICD-10-CM

## 2017-10-07 HISTORY — DX: Alcohol abuse, uncomplicated: F10.10

## 2017-10-07 LAB — POCT GLYCOSYLATED HEMOGLOBIN (HGB A1C): HEMOGLOBIN A1C: 5

## 2017-10-07 MED ORDER — AMLODIPINE BESYLATE 10 MG PO TABS: 10.0000 mg | ORAL_TABLET | Freq: Every day | ORAL | 6 refills | Status: DC

## 2017-10-07 MED ORDER — OMEPRAZOLE 20 MG PO CPDR: 20.0000 mg | DELAYED_RELEASE_CAPSULE | Freq: Every day | ORAL | 6 refills | Status: DC

## 2017-10-07 MED FILL — OMEPRAZOLE DR 20 MG CAPSULE: 20 | 30 days supply | Qty: 30 | Fill #0

## 2017-10-07 MED FILL — AMLODIPINE BESYLATE 10 MG T: 10 | 30 days supply | Qty: 30 | Fill #5

## 2017-10-07 NOTE — Progress Notes (Signed)
Subjective:  Patient ID: Calvin Gardner, male    DOB: September 17, 1950  Age: 67 y.o. MRN: [id number]  CC: Hypertension   HPI Calvin Gardner is a 67 y/o male with medical history of Hep C (treatment completed 02/2015), Liver fibrosis, HTN, IFG (last A1C 5.0), Vitamin D deficiency, and Cataracts. He presents today for f/u for chronic conditions and recurrent hiccups.  Hiccups: Reports continued hiccups not controlled with Thorazine; patient stopped taking the medication. Reports symptoms are intermittent and may last 2 weeks in duration. Denies n/v or abdominal pain. Reports burning sensation at the back of his throat and coughing at night from throat irritation.   HTN: Patient intermittently adherent with Amlodipine. He recently ran out of his medication this past Sunday. Denies cp, sob, dizziness, peripheral edema, or current headaches. He has baseline cataracts in both eyes. Denies changes in vision. He needs an Ophthalmology referral.   Liver Fibrosis: Patient has history of hepatitis C; resolved from completed treatment in 02/2015. His last RUQ ultrasound (10/05/14), IMPIMPRESSION: Coarse hepatic echotexture with hyperechoic hepatic parenchyma, suggesting hepatic steatosis and/or mild cirrhosis. Reports continued drinking with 3-4 forty's per day.  Vitamin D deficiency: Patient not adherent with Vitamin D supplement; reports he stopped taking this medication "a while ago", because the pills are too large. He would prefer a liquid medication. Denies generalized weakness, overall fatigue, bone pain, or fractures.   Tobacco Abuse: Patient continues to smoke 1 ppd. Declines in assistance to quit smoking at this time.   Healthcare Maintenance: Patient due for Tdap; declined at this time.   Past Medical History:  Diagnosis Date  . Cataract   . Hypertension    Past Surgical History:  Procedure Laterality Date  . COLONOSCOPY WITH PROPOFOL N/A 01/30/2014   Procedure: COLONOSCOPY WITH PROPOFOL;   Surgeon: Garlan Fair, MD;  Location: [facility];  Service: Endoscopy;  Laterality: N/A;  . ESOPHAGOGASTRODUODENOSCOPY (EGD) WITH PROPOFOL N/A 01/30/2014   Procedure: ESOPHAGOGASTRODUODENOSCOPY (EGD) WITH PROPOFOL;  Surgeon: Garlan Fair, MD;  Location: [facility];  Service: Endoscopy;  Laterality: N/A;  . left jaw surgery     . MANDIBLE FRACTURE SURGERY     Patient Active Problem List   Diagnosis Date Noted  . Hiccough 10/07/2017  . Alcohol abuse 10/07/2017  . Chest pain 10/29/2015  . Liver fibrosis ([place]) 10/23/2014  . Chronic hepatitis C without hepatic coma ([place]) 09/19/2014  . IFG (impaired fasting glucose) 04/02/2014  . Vitamin D deficiency 04/02/2014  . Abnormal LFTs 04/02/2014  . Cataract, right eye 01/05/2014  . Essential hypertension, benign 01/05/2014  . Smoking 01/05/2014   Social History   Socioeconomic History  . Marital status: Single    Spouse name: Not on file  . Number of children: Not on file  . Years of education: Not on file  . Highest education level: Not on file  Social Needs  . Financial resource strain: Not on file  . Food insecurity - worry: Not on file  . Food insecurity - inability: Not on file  . Transportation needs - medical: Not on file  . Transportation needs - non-medical: Not on file  Occupational History  . Not on file  Tobacco Use  . Smoking status: Current Some Day Smoker    Types: Cigarettes  . Smokeless tobacco: Never Used  . Tobacco comment: trying to quit  Substance and Sexual Activity  . Alcohol use: Yes    Alcohol/week: 0.0 oz    Comment: occasional  . Drug use:  No  . Sexual activity: Yes    Partners: Female, Male    Comment: patient declines  Other Topics Concern  . Not on file  Social History Narrative  . Not on file   Outpatient Medications Prior to Visit  Medication Sig Dispense Refill  . aspirin EC 81 MG tablet Take 81 mg by mouth every morning. Reported on 12/19/2015    . amLODipine (NORVASC) 10 MG  tablet Take 1 tablet (10 mg total) by mouth daily. 30 tablet 6  . baclofen (LIORESAL) 10 MG tablet Take 1 tablet (10 mg total) 2 (two) times daily as needed by mouth for muscle spasms. For hiccups (Patient not taking: Reported on 10/07/2017) 30 each 0  . cetirizine (ZYRTEC) 10 MG tablet Take 1 tablet (10 mg total) daily by mouth. (Patient not taking: Reported on 10/07/2017) 30 tablet 1  . cholecalciferol (VITAMIN D) 400 units TABS tablet Take 400 Units by mouth. Reported on 12/19/2015     No facility-administered medications prior to visit.    No Known Allergies  ROS Review of Systems  Constitutional: Negative for activity change, appetite change, chills, fatigue and unexpected weight change.  HENT: Positive for sore throat (burning sensation). Negative for trouble swallowing and voice change.   Eyes: Negative for visual disturbance.  Respiratory: Positive for cough (predominately at night). Negative for choking, chest tightness and shortness of breath.   Cardiovascular: Negative for chest pain, palpitations and leg swelling.  Gastrointestinal: Negative for abdominal distention, abdominal pain, blood in stool, constipation, diarrhea, nausea and vomiting.  Endocrine: Negative for polydipsia, polyphagia and polyuria.  Genitourinary: Negative for decreased urine volume and difficulty urinating.  Skin: Negative for color change, rash and wound.  Neurological: Negative for dizziness, tremors, syncope, weakness, light-headedness, numbness and headaches.  Psychiatric/Behavioral: Negative for agitation, confusion, decreased concentration, dysphoric mood, hallucinations, sleep disturbance and suicidal ideas. The patient is not nervous/anxious.   All other systems reviewed and are negative.  Objective:  BP (!) 153/93   Pulse 91   Temp 98.2 F (36.8 C) (Oral)   Ht 5' 6"  (1.676 m)   Wt 127 lb 12.8 oz (58 kg)   SpO2 99%   BMI 20.63 kg/m   BP/Weight 10/07/2017 06/11/2017 8/92/1194  Systolic BP 174 081  448  Diastolic BP 93 72 69  Wt. (Lbs) 127.8 126.4 129.6  BMI 20.63 20.4 20.92   Lab Results  Component Value Date   HGBA1C 5.0 10/07/2017   Physical Exam  Constitutional: He is oriented to person, place, and time. He appears well-developed and well-nourished. No distress.  HENT:  Head: Normocephalic and atraumatic.  Mouth/Throat: Oropharynx is clear and moist.  Eyes: Conjunctivae and EOM are normal.  Neck: Normal range of motion. Neck supple. Carotid bruit is not present.  Cardiovascular: Normal heart sounds. Exam reveals no gallop and no friction rub.  No murmur heard. Pulses:      Radial pulses are 2+ on the right side, and 2+ on the left side.       Dorsalis pedis pulses are 2+ on the right side, and 2+ on the left side.  Negative for pedal edema  Pulmonary/Chest: Effort normal and breath sounds normal. No respiratory distress. He exhibits no tenderness.  Abdominal: Soft. Bowel sounds are normal. He exhibits no distension. There is no tenderness.  Musculoskeletal: Normal range of motion. He exhibits no edema or tenderness.  Lymphadenopathy:    He has no cervical adenopathy.  Neurological: He is alert and oriented to person, place,  and time.  Skin: Skin is warm, dry and intact. No rash noted. No erythema.  Psychiatric: He has a normal mood and affect. His speech is normal and behavior is normal. Judgment and thought content normal.  Nursing note and vitals reviewed.  Assessment & Plan:   1. Essential hypertension, benign, uncontrolled - Patient has not had medication for 4-5 days. His previous clinic visitation on 06/11/17, had recorded BP of 118/72. He is asymptomatic at this time. - Continue medication regimen. Instructed to maintain low-salt diet. - Advised to reduce smoking and set a stop date. Educated on the cardiovascular risk r/t smoking.  -F/u in 3 months. - amLODipine (NORVASC) 10 MG tablet; Take 1 tablet (10 mg total) by mouth daily.  Dispense: 30 tablet; Refill:  6 - CMP14+EGFR  2. IFG (impaired fasting glucose), controlled  - In clinic A1c was 5.0. Will continue to monitor.  - F/u in 6 months.  - POCT glycosylated hemoglobin (Hb A1C)  3. Hiccough, uncontrolled - Present with continued symptoms; throat irritation, worsened at night with coughing present. Concerned for onset of acid reflux.  - Ordered Omeprazole 20 mg/day. Advised to wait 3-4 hours before lying down after eating. Will continue to monitor.  - F/u in 3 months or as needed.   4. Vitamin D deficiency - Patient non-adherent with medication because he prefers the liquid. Ordered Vitamin D lab to evaluate.  - If he is still deficient, will order liquid medication.  - F/u in 3 months or as needed.  - VITAMIN D 25 Hydroxy (Vit-D Deficiency, Fractures)  5. Liver fibrosis ([place])  - Patient continues to drinking alcohol. Education provided on the liver health risk r/t excessive alcohol intake. Last RUQ ultrasound (10/05/14) noted mild liver cirrhosis.  - Ordered repeat RUQ ultrasound to reevaluate; CMP for liver function.  - F/u in 3 months. - US Abdomen Limited RUQ; Future  6. Alcohol abuse, uncontrolled - Patient actively drinking on a daily basis. Advise to reduce intake or to stop completely.  - Will continue to monitor. CMP ordered, as well.  - F/u in 3 months.   7. Smoking, uncontrolled - Patient is not ready to stop smoking. Smoking cessation education provided.  - F/u as needed.   8. Other cataract of both eyes, controlled - No acute visual changes reported. Ophthalmology referral ordered.  - Ambulatory referral to Ophthalmology  Meds ordered this encounter  Medications  . amLODipine (NORVASC) 10 MG tablet    Sig: Take 1 tablet (10 mg total) by mouth daily.    Dispense:  30 tablet    Refill:  6  . omeprazole (PRILOSEC) 20 MG capsule    Sig: Take 1 capsule (20 mg total) by mouth daily.    Dispense:  30 capsule    Refill:  6    Follow-up: Return in about 3 months  (around 01/07/2018) for follow up of chronic medical conditions.   Casper Pagliuca H. Hulan Fray, AGNP-DNP student

## 2017-10-07 NOTE — Patient Instructions (Signed)
Alcohol Abuse and Nutrition Alcohol abuse is any pattern of alcohol consumption that harms your health, relationships, or work. Alcohol abuse can affect how your body breaks down and absorbs nutrients from food by causing your liver to work abnormally. Additionally, many people who abuse alcohol do not eat enough carbohydrates, protein, fat, vitamins, and minerals. This can cause poor nutrition (malnutrition) and a lack of nutrients (nutrient deficiencies), which can lead to further complications. Nutrients that are commonly lacking (deficient) among people who abuse alcohol include:  Vitamins. ? Vitamin A. This is stored in your liver. It is important for your vision, metabolism, and ability to fight off infections (immunity). ? B vitamins. These include vitamins such as folate, thiamin, and niacin. These are important in new cell growth and maintenance. ? Vitamin C. This plays an important role in iron absorption, wound healing, and immunity. ? Vitamin D. This is produced by your liver, but you can also get vitamin D from food. Vitamin D is necessary for your body to absorb and use calcium.  Minerals. ? Calcium. This is important for your bones and your heart and blood vessel (cardiovascular) function. ? Iron. This is important for blood, muscle, and nervous system functioning. ? Magnesium. This plays an important role in muscle and nerve function, and it helps to control blood sugar and blood pressure. ? Zinc. This is important for the normal function of your nervous system and digestive system (gastrointestinal tract).  Nutrition is an essential component of therapy for alcohol abuse. Your health care provider or dietitian will work with you to design a plan that can help restore nutrients to your body and prevent potential complications. What is my plan? Your dietitian may develop a specific diet plan that is based on your condition and any other complications you may have. A diet plan will  commonly include:  A balanced diet. ? Grains: 6-8 oz per day. ? Vegetables: 2-3 cups per day. ? Fruits: 1-2 cups per day. ? Meat and other protein: 5-6 oz per day. ? Dairy: 2-3 cups per day.  Vitamin and mineral supplements.  What do I need to know about alcohol and nutrition?  Consume foods that are high in antioxidants, such as grapes, berries, nuts, green tea, and dark green and orange vegetables. This can help to counteract some of the stress that is placed on your liver by consuming alcohol.  Avoid food and drinks that are high in fat and sugar. Foods such as sugared soft drinks, salty snack foods, and candy contain empty calories. This means that they lack important nutrients such as protein, fiber, and vitamins.  Eat frequent meals and snacks. Try to eat 5-6 small meals each day.  Eat a variety of fresh fruits and vegetables each day. This will help you get plenty of water, fiber, and vitamins in your diet.  Drink plenty of water and other clear fluids. Try to drink at least 48-64 oz (1.5-2 L) of water per day.  If you are a vegetarian, eat a variety of protein-rich foods. Pair whole grains with plant-based proteins at meals and snacks to obtain the greatest nutrient benefit from your food. For example, eat rice with beans, put peanut butter on whole-grain toast, or eat oatmeal with sunflower seeds.  Soak beans and whole grains overnight before cooking. This can help your body to absorb the nutrients more easily.  Include foods fortified with vitamins and minerals in your diet. Commonly fortified foods include milk, orange juice, cereal, and bread.  If you are malnourished, your dietitian may recommend a high-protein, high-calorie diet. This may include: ? 2,000-3,000 calories (kilocalories) per day. ? 70-100 grams of protein per day.  Your health care provider may recommend a complete nutritional supplement beverage. This can help to restore calories, protein, and vitamins to  your body. Depending on your condition, you may be advised to consume this instead of or in addition to meals.  Limit your intake of caffeine. Replace drinks like coffee and black tea with decaffeinated coffee and herbal tea.  Eat a variety of foods that are high in omega fatty acids. These include fish, nuts and seeds, and soybeans. These foods may help your liver to recover and may also stabilize your mood.  Certain medicines may cause changes in your appetite, taste, and weight. Work with your health care provider and dietitian to make any adjustments to your medicines and diet plan.  Include other healthy lifestyle choices in your daily routine. ? Be physically active. ? Get enough sleep. ? Spend time doing activities that you enjoy.  If you are unable to take in enough food and calories by mouth, your health care provider may recommend a feeding tube. This is a tube that passes through your nose and throat, directly into your stomach. Nutritional supplement beverages can be given to you through the feeding tube to help you get the nutrients you need.  Take vitamin or mineral supplements as recommended by your health care provider. What foods can I eat? Grains Enriched pasta. Enriched rice. Fortified whole-grain bread. Fortified whole-grain cereal. Barley. Brown rice. Quinoa. [place]. Vegetables All fresh, frozen, and canned vegetables. Spinach. Kale. Artichoke. Carrots. Winter squash and pumpkin. Sweet potatoes. Broccoli. Cabbage. Cucumbers. Tomatoes. Sweet peppers. Green beans. Peas. Corn. Fruits All fresh and frozen fruits. Berries. Grapes. Mango. Papaya. Guava. Cherries. Apples. Bananas. Peaches. Plums. Pineapple. Watermelon. Cantaloupe. Oranges. Avocado. Meats and Other Protein Sources Beef liver. Lean beef. Pork. Fresh and canned chicken. Fresh fish. Oysters. Sardines. Canned tuna. Shrimp. Eggs with yolks. Nuts and seeds. Peanut butter. Beans and lentils. Soybeans.  Tofu. Dairy Whole, low-fat, and nonfat milk. Whole, low-fat, and nonfat yogurt. Cottage cheese. Sour cream. Hard and soft cheeses. Beverages Water. Herbal tea. Decaffeinated coffee. Decaffeinated green tea. 100% fruit juice. 100% vegetable juice. Instant breakfast shakes. Condiments Ketchup. Mayonnaise. Mustard. Salad dressing. Barbecue sauce. Sweets and Desserts Sugar-free ice cream. Sugar-free pudding. Sugar-free gelatin. Fats and Oils Butter. Vegetable oil, flaxseed oil, olive oil, and walnut oil. Other Complete nutrition shakes. Protein bars. Sugar-free gum. The items listed above may not be a complete list of recommended foods or beverages. Contact your dietitian for more options. What foods are not recommended? Grains Sugar-sweetened breakfast cereals. Flavored instant oatmeal. Fried breads. Vegetables Breaded or deep-fried vegetables. Fruits Dried fruit with added sugar. Candied fruit. Canned fruit in syrup. Meats and Other Protein Sources Breaded or deep-fried meats. Dairy Flavored milks. Fried cheese curds or fried cheese sticks. Beverages Alcohol. Sugar-sweetened soft drinks. Sugar-sweetened tea. Caffeinated coffee and tea. Condiments Sugar. Honey. Agave nectar. Molasses. Sweets and Desserts Chocolate. Cake. Cookies. Candy. Other Potato chips. Pretzels. Salted nuts. Candied nuts. The items listed above may not be a complete list of foods and beverages to avoid. Contact your dietitian for more information. This information is not intended to replace advice given to you by your health care provider. Make sure you discuss any questions you have with your health care provider. Document Released: 05/14/2005 Document Revised: 11/27/2015 Document Reviewed: 02/20/2014 Elsevier Interactive Patient Education  2018 [employer] Inc.  

## 2017-10-08 ENCOUNTER — Other Ambulatory Visit: Payer: Self-pay | Admitting: Family Medicine

## 2017-10-08 LAB — CMP14+EGFR
A/G RATIO: 1.8 (ref 1.2–2.2)
ALK PHOS: 59 IU/L (ref 39–117)
ALT: 37 IU/L (ref 0–44)
AST: 93 IU/L — AB (ref 0–40)
Albumin: 4.8 g/dL (ref 3.6–4.8)
BILIRUBIN TOTAL: 0.9 mg/dL (ref 0.0–1.2)
BUN/Creatinine Ratio: 8 — ABNORMAL LOW (ref 10–24)
BUN: 6 mg/dL — ABNORMAL LOW (ref 8–27)
CHLORIDE: 98 mmol/L (ref 96–106)
CO2: 23 mmol/L (ref 20–29)
Calcium: 10.4 mg/dL — ABNORMAL HIGH (ref 8.6–10.2)
Creatinine, Ser: 0.74 mg/dL — ABNORMAL LOW (ref 0.76–1.27)
GFR calc Af Amer: 113 mL/min/{1.73_m2} (ref >59)
GFR calc non Af Amer: 97 mL/min/{1.73_m2} (ref >59)
Globulin, Total: 2.7 g/dL (ref 1.5–4.5)
Glucose: 106 mg/dL — ABNORMAL HIGH (ref 65–99)
POTASSIUM: 4.3 mmol/L (ref 3.5–5.2)
Sodium: 137 mmol/L (ref 134–144)
Total Protein: 7.5 g/dL (ref 6.0–8.5)

## 2017-10-08 LAB — VITAMIN D 25 HYDROXY (VIT D DEFICIENCY, FRACTURES): VIT D 25 HYDROXY: 6.6 ng/mL — AB (ref 30.0–100.0)

## 2017-10-08 MED ORDER — ERGOCALCIFEROL 1.25 MG (50000 UT) PO CAPS: 50000.0000 [IU] | ORAL_CAPSULE | ORAL | 1 refills | Status: DC

## 2017-10-12 ENCOUNTER — Telehealth: Payer: Self-pay | Admitting: *Deleted

## 2017-10-12 NOTE — Telephone Encounter (Signed)
Attempt to inform patient with results at only given number in chart.  Hargrove,Wilma Significant other [phone]  No answer.

## 2017-10-14 ENCOUNTER — Ambulatory Visit (HOSPITAL_COMMUNITY)
Admission: RE | Admit: 2017-10-14 | Discharge: 2017-10-14 | Disposition: A | Discharge status: Home / Self Care | Payer: Medicare Other | Source: Ambulatory Visit | Attending: Family Medicine | Admitting: Family Medicine

## 2017-10-14 ENCOUNTER — Other Ambulatory Visit: Payer: Self-pay | Admitting: Family Medicine

## 2017-10-14 DIAGNOSIS — R160 Hepatomegaly, not elsewhere classified: Secondary | ICD-10-CM | POA: Insufficient documentation

## 2017-10-14 DIAGNOSIS — K7400 Hepatic fibrosis, unspecified: Secondary | ICD-10-CM

## 2017-10-14 DIAGNOSIS — K769 Liver disease, unspecified: Secondary | ICD-10-CM

## 2017-10-14 DIAGNOSIS — K7460 Unspecified cirrhosis of liver: Secondary | ICD-10-CM | POA: Diagnosis not present

## 2017-10-14 NOTE — Progress Notes (Signed)
Patient does not have number on file   If patient calls for lab results please inform patient that Vit D is low medication sent to pharmacy, also liver enzymes are elevated and this could be secondary to alcohol comsumption

## 2017-10-14 NOTE — Telephone Encounter (Signed)
Patient was called and there is no voicemail set up to leave a message. 

## 2017-11-10 MED FILL — OMEPRAZOLE 20 MG CAP: 20 | 30 days supply | Qty: 30 | Fill #1

## 2017-11-10 MED FILL — AMLODIPINE BESYLATE 10 MG T: 10 | 30 days supply | Qty: 30 | Fill #0

## 2017-12-13 MED FILL — OMEPRAZOLE 20 MG CAP: 20 | 30 days supply | Qty: 30 | Fill #2

## 2017-12-13 MED FILL — AMLODIPINE BESYLATE 10 MG T: 10 | 30 days supply | Qty: 30 | Fill #1

## 2018-01-10 ENCOUNTER — Encounter: Payer: Self-pay | Admitting: Family Medicine

## 2018-01-10 ENCOUNTER — Ambulatory Visit: Payer: Medicare Other | Attending: Family Medicine | Admitting: Family Medicine

## 2018-01-10 VITALS — BP 111/68 | HR 89 | Temp 97.7°F | Ht 66.0 in | Wt 121.4 lb

## 2018-01-10 DIAGNOSIS — Z122 Encounter for screening for malignant neoplasm of respiratory organs: Secondary | ICD-10-CM

## 2018-01-10 DIAGNOSIS — Z7982 Long term (current) use of aspirin: Secondary | ICD-10-CM | POA: Insufficient documentation

## 2018-01-10 DIAGNOSIS — M720 Palmar fascial fibromatosis [Dupuytren]: Secondary | ICD-10-CM | POA: Insufficient documentation

## 2018-01-10 DIAGNOSIS — I10 Essential (primary) hypertension: Secondary | ICD-10-CM | POA: Diagnosis not present

## 2018-01-10 DIAGNOSIS — K219 Gastro-esophageal reflux disease without esophagitis: Secondary | ICD-10-CM | POA: Insufficient documentation

## 2018-01-10 DIAGNOSIS — R399 Unspecified symptoms and signs involving the genitourinary system: Secondary | ICD-10-CM | POA: Diagnosis not present

## 2018-01-10 DIAGNOSIS — F17200 Nicotine dependence, unspecified, uncomplicated: Secondary | ICD-10-CM | POA: Diagnosis not present

## 2018-01-10 DIAGNOSIS — Z0000 Encounter for general adult medical examination without abnormal findings: Secondary | ICD-10-CM | POA: Diagnosis not present

## 2018-01-10 DIAGNOSIS — H269 Unspecified cataract: Secondary | ICD-10-CM | POA: Insufficient documentation

## 2018-01-10 DIAGNOSIS — Z79899 Other long term (current) drug therapy: Secondary | ICD-10-CM | POA: Insufficient documentation

## 2018-01-10 DIAGNOSIS — F17210 Nicotine dependence, cigarettes, uncomplicated: Secondary | ICD-10-CM | POA: Insufficient documentation

## 2018-01-10 MED ORDER — AMLODIPINE BESYLATE 10 MG PO TABS: 10.0000 mg | ORAL_TABLET | Freq: Every day | ORAL | 6 refills | Status: DC

## 2018-01-10 MED ORDER — OMEPRAZOLE 20 MG PO CPDR: 20.0000 mg | DELAYED_RELEASE_CAPSULE | Freq: Every day | ORAL | 6 refills | Status: DC

## 2018-01-10 MED ORDER — TAMSULOSIN HCL 0.4 MG PO CAPS: 0.4000 mg | ORAL_CAPSULE | Freq: Every day | ORAL | 6 refills | Status: DC

## 2018-01-10 MED FILL — OMEPRAZOLE 20 MG CAP: 20 | 30 days supply | Qty: 30 | Fill #0

## 2018-01-10 MED FILL — AMLODIPINE BESYLATE 10 MG T: 10 | 30 days supply | Qty: 30 | Fill #0

## 2018-01-10 MED FILL — TAMSULOSIN HCL 0.4 MG CAP: 0.4 | 30 days supply | Qty: 30 | Fill #0

## 2018-01-10 NOTE — Patient Instructions (Signed)
Steps to Quit Smoking Smoking tobacco can be bad for your health. It can also affect almost every organ in your body. Smoking puts you and people around you at risk for many serious long-lasting (chronic) diseases. Quitting smoking is hard, but it is one of the best things that you can do for your health. It is never too late to quit. What are the benefits of quitting smoking? When you quit smoking, you lower your risk for getting serious diseases and conditions. They can include:  Lung cancer or lung disease.  Heart disease.  Stroke.  Heart attack.  Not being able to have children (infertility).  Weak bones (osteoporosis) and broken bones (fractures).  If you have coughing, wheezing, and shortness of breath, those symptoms may get better when you quit. You may also get sick less often. If you are pregnant, quitting smoking can help to lower your chances of having a baby of low birth weight. What can I do to help me quit smoking? Talk with your doctor about what can help you quit smoking. Some things you can do (strategies) include:  Quitting smoking totally, instead of slowly cutting back how much you smoke over a period of time.  Going to in-person counseling. You are more likely to quit if you go to many counseling sessions.  Using resources and support systems, such as: ? Online chats with a counselor. ? Phone quitlines. ? Printed self-help materials. ? Support groups or group counseling. ? Text messaging programs. ? Mobile phone apps or applications.  Taking medicines. Some of these medicines may have nicotine in them. If you are pregnant or breastfeeding, do not take any medicines to quit smoking unless your doctor says it is okay. Talk with your doctor about counseling or other things that can help you.  Talk with your doctor about using more than one strategy at the same time, such as taking medicines while you are also going to in-person counseling. This can help make  quitting easier. What things can I do to make it easier to quit? Quitting smoking might feel very hard at first, but there is a lot that you can do to make it easier. Take these steps:  Talk to your family and friends. Ask them to support and encourage you.  Call phone quitlines, reach out to support groups, or work with a counselor.  Ask people who smoke to not smoke around you.  Avoid places that make you want (trigger) to smoke, such as: ? Bars. ? Parties. ? Smoke-break areas at work.  Spend time with people who do not smoke.  Lower the stress in your life. Stress can make you want to smoke. Try these things to help your stress: ? Getting regular exercise. ? Deep-breathing exercises. ? Yoga. ? Meditating. ? Doing a body scan. To do this, close your eyes, focus on one area of your body at a time from head to toe, and notice which parts of your body are tense. Try to relax the muscles in those areas.  Download or buy apps on your mobile phone or tablet that can help you stick to your quit plan. There are many free apps, such as QuitGuide from the CDC (Centers for Disease Control and Prevention). You can find more support from smokefree.gov and other websites.  This information is not intended to replace advice given to you by your health care provider. Make sure you discuss any questions you have with your health care provider. Document Released: 05/16/2009 Document   Revised: 03/17/2016 Document Reviewed: 12/04/2014 Elsevier Interactive Patient Education  2018 Elsevier Inc.  

## 2018-01-10 NOTE — Progress Notes (Signed)
Subjective:  Patient ID: Calvin Gardner, male    DOB: 08-03-1951  Age: 67 y.o. MRN: [id number]  CC: Hypertension   HPI Calvin Gardner is a 67 year old male with a history of hypertension, GERD, tobacco abuse who presents today for follow-up visit. He complains of pain in the right palm which he has had for several months and has also noticed a swelling there.  Denies history of trauma and symptoms are worse when he lifts with his right hand.  He is right-handed. He also complains of nocturia and straining; he has no history of diabetes mellitus and last A1c was 5.0. He has smoked for 50 years and smokes 1 pack of cigarettes per day; denies chest pain or shortness of breath. Doing well on his antihypertensive and denies adverse effects from it. Refux symptoms are controlled on omeprazole and he denies abdominal pain, diarrhea, nausea or vomiting.  Past Medical History:  Diagnosis Date  . Cataract   . Hypertension     Past Surgical History:  Procedure Laterality Date  . COLONOSCOPY WITH PROPOFOL N/A 01/30/2014   Procedure: COLONOSCOPY WITH PROPOFOL;  Surgeon: Calvin Fair, Gardner;  Location: [facility];  Service: Endoscopy;  Laterality: N/A;  . ESOPHAGOGASTRODUODENOSCOPY (EGD) WITH PROPOFOL N/A 01/30/2014   Procedure: ESOPHAGOGASTRODUODENOSCOPY (EGD) WITH PROPOFOL;  Surgeon: Calvin Fair, Gardner;  Location: [facility];  Service: Endoscopy;  Laterality: N/A;  . left jaw surgery     . MANDIBLE FRACTURE SURGERY      No Known Allergies   Outpatient Medications Prior to Visit  Medication Sig Dispense Refill  . aspirin EC 81 MG tablet Take 81 mg by mouth every morning. Reported on 12/19/2015    . amLODipine (NORVASC) 10 MG tablet Take 1 tablet (10 mg total) by mouth daily. 30 tablet 6  . omeprazole (PRILOSEC) 20 MG capsule Take 1 capsule (20 mg total) by mouth daily. 30 capsule 6  . cholecalciferol (VITAMIN D) 400 units TABS tablet Take 400 Units by mouth. Reported on 12/19/2015     . baclofen (LIORESAL) 10 MG tablet Take 1 tablet (10 mg total) 2 (two) times daily as needed by mouth for muscle spasms. For hiccups (Patient not taking: Reported on 10/07/2017) 30 each 0  . cetirizine (ZYRTEC) 10 MG tablet Take 1 tablet (10 mg total) daily by mouth. (Patient not taking: Reported on 10/07/2017) 30 tablet 1  . ergocalciferol (DRISDOL) 50000 units capsule Take 1 capsule (50,000 Units total) by mouth once a week. (Patient not taking: Reported on 01/10/2018) 9 capsule 1   No facility-administered medications prior to visit.     ROS Review of Systems  Constitutional: Negative for activity change and appetite change.  HENT: Negative for sinus pressure and sore throat.   Eyes: Negative for visual disturbance.  Respiratory: Negative for cough, chest tightness and shortness of breath.   Cardiovascular: Negative for chest pain and leg swelling.  Gastrointestinal: Negative for abdominal distention, abdominal pain, constipation and diarrhea.  Endocrine: Negative.   Genitourinary: Negative for dysuria.  Musculoskeletal:       See hpi  Skin: Negative for rash.  Allergic/Immunologic: Negative.   Neurological: Negative for weakness, light-headedness and numbness.  Psychiatric/Behavioral: Negative for dysphoric mood and suicidal ideas.    Objective:  BP 111/68   Pulse 89   Temp 97.7 F (36.5 C) (Oral)   Ht 5\' 6"  (1.676 m)   Wt 121 lb 6.4 oz (55.1 kg)   SpO2 99%   BMI 19.59 kg/m  BP/Weight 01/10/2018 10/07/2017 72/0/9470  Systolic BP 962 836 629  Diastolic BP 68 93 72  Wt. (Lbs) 121.4 127.8 126.4  BMI 19.59 20.63 20.4      Physical Exam  Constitutional: He is oriented to person, place, and time. He appears well-developed and well-nourished.  Cardiovascular: Normal rate, normal heart sounds and intact distal pulses.  No murmur heard. Pulmonary/Chest: Effort normal and breath sounds normal. He has no wheezes. He has no rales. He exhibits no tenderness.  Abdominal: Soft.  Bowel sounds are normal. He exhibits no distension and no mass. There is no tenderness.  Musculoskeletal:  Dupuytren's contracture of right fourth finger tenderness on palmar aspect  Neurological: He is alert and oriented to person, place, and time.  Skin: Skin is warm and dry.  Psychiatric: He has a normal mood and affect.     Assessment & Plan:   1. Essential hypertension, benign Controlled Counseled on blood pressure goal of less than 130/80, low-sodium, DASH diet, medication compliance, 150 minutes of moderate intensity exercise per week. Discussed medication compliance, adverse effects. - amLODipine (NORVASC) 10 MG tablet; Take 1 tablet (10 mg total) by mouth daily.  Dispense: 30 tablet; Refill: 6  2. Dupuytren's contracture of right hand - AMB referral to orthopedics  3. Lower urinary tract symptoms Uncontrolled - tamsulosin (FLOMAX) 0.4 MG CAPS capsule; Take 1 capsule (0.4 mg total) by mouth daily.  Dispense: 30 capsule; Refill: 6  4. Healthcare maintenance Declines Tdap and Pneumovax today  5. Encounter for screening for lung cancer - CT CHEST LUNG CA SCREEN LOW DOSE W/O CM; Future  6. Smoking Spent 3 minutes counseling on smoking cessation and he is working on quitting - CT CHEST LUNG CA SCREEN LOW DOSE W/O CM; Future  7. Gastroesophageal reflux disease without esophagitis Controlled - omeprazole (PRILOSEC) 20 MG capsule; Take 1 capsule (20 mg total) by mouth daily.  Dispense: 30 capsule; Refill: 6   Meds ordered this encounter  Medications  . tamsulosin (FLOMAX) 0.4 MG CAPS capsule    Sig: Take 1 capsule (0.4 mg total) by mouth daily.    Dispense:  30 capsule    Refill:  6  . amLODipine (NORVASC) 10 MG tablet    Sig: Take 1 tablet (10 mg total) by mouth daily.    Dispense:  30 tablet    Refill:  6  . omeprazole (PRILOSEC) 20 MG capsule    Sig: Take 1 capsule (20 mg total) by mouth daily.    Dispense:  30 capsule    Refill:  6    Follow-up: Return in  about 6 months (around 07/12/2018) for Follow-up of chronic medical conditions.   Calvin Gardner

## 2018-01-12 ENCOUNTER — Ambulatory Visit (INDEPENDENT_AMBULATORY_CARE_PROVIDER_SITE_OTHER): Payer: Medicare Other | Admitting: Orthopaedic Surgery

## 2018-01-12 ENCOUNTER — Ambulatory Visit (INDEPENDENT_AMBULATORY_CARE_PROVIDER_SITE_OTHER): Payer: Medicare Other

## 2018-01-12 DIAGNOSIS — M720 Palmar fascial fibromatosis [Dupuytren]: Secondary | ICD-10-CM

## 2018-01-12 DIAGNOSIS — M19041 Primary osteoarthritis, right hand: Secondary | ICD-10-CM

## 2018-01-12 MED ORDER — DICLOFENAC SODIUM 1 % TD GEL: 2.0000 g | Freq: Four times a day (QID) | TRANSDERMAL | 5 refills | Status: DC

## 2018-01-12 MED FILL — DICLOFENAC SODIUM 1% GEL: 1 | 12 days supply | Qty: 100 | Fill #0

## 2018-01-12 NOTE — Progress Notes (Signed)
Office Visit Note   Patient: Calvin Gardner           Date of Birth: 04/20/1951           MRN: [id number] Visit Date: 01/12/2018              Requested by: Charlott Rakes, MD [place], [place] [address] PCP: Charlott Rakes, MD   Assessment & Plan: Visit Diagnoses:  1. Osteoarthritis of right hand, unspecified osteoarthritis type   2. Dupuytren contracture     Plan: Impression is right hand osteoarthritis and Dupuytren's contracture.  For the arthritis I gave him a prescription for Voltaren gel to help with the pain.  I educated him on Dupuytren's contracture.  Questions encouraged and answered.  Follow-up as needed.  Follow-Up Instructions: Return if symptoms worsen or fail to improve.   Orders:  Orders Placed This Encounter  Procedures  . XR Hand Complete Right   Meds ordered this encounter  Medications  . diclofenac sodium (VOLTAREN) 1 % GEL    Sig: Apply 2 g topically 4 (four) times daily.    Dispense:  1 Tube    Refill:  5      Procedures: No procedures performed   Clinical Data: No additional findings.   Subjective: Chief Complaint  Patient presents with  . Right Hand - Pain    Is a very pleasant 67 year old gentleman who comes in with right hand pain and Dupuytren's contracture.  He states he has been having pain and stiffness  That is worse in the morning.  Denies any numbness and tingling.   Review of Systems  Constitutional: Negative.   All other systems reviewed and are negative.    Objective: Vital Signs: There were no vitals taken for this visit.  Physical Exam  Constitutional: He is oriented to person, place, and time. He appears well-developed and well-nourished.  HENT:  Head: Normocephalic and atraumatic.  Eyes: Pupils are equal, round, and reactive to light.  Neck: Neck supple.  Pulmonary/Chest: Effort normal.  Abdominal: Soft.  Musculoskeletal: Normal range of motion.  Neurological: He is alert and oriented  to person, place, and time.  Skin: Skin is warm.  Psychiatric: He has a normal mood and affect. His behavior is normal. Judgment and thought content normal.  Nursing note and vitals reviewed.   Ortho Exam Right hand exam shows a palpable Dupuytren's cord in line with the ring finger.  He has no limitation and range of motion.  He is able to make a full composite fist.  He has no flexion contractures.  There is no swelling or masses or lesions. Specialty Comments:  No specialty comments available.  Imaging: Xr Hand Complete Right  Result Date: 01/12/2018 Osteoarthritis of the right hand.    PMFS History: Patient Active Problem List   Diagnosis Date Noted  . GERD (gastroesophageal reflux disease) 01/10/2018  . Hiccough 10/07/2017  . Alcohol abuse 10/07/2017  . Chest pain 10/29/2015  . Liver fibrosis ([place]) 10/23/2014  . Chronic hepatitis C without hepatic coma ([place]) 09/19/2014  . IFG (impaired fasting glucose) 04/02/2014  . Vitamin D deficiency 04/02/2014  . Abnormal LFTs 04/02/2014  . Cataract, right eye 01/05/2014  . Essential hypertension, benign 01/05/2014  . Smoking 01/05/2014   Past Medical History:  Diagnosis Date  . Cataract   . Hypertension     No family history on file.  Past Surgical History:  Procedure Laterality Date  . COLONOSCOPY WITH PROPOFOL N/A 01/30/2014  Procedure: COLONOSCOPY WITH PROPOFOL;  Surgeon: Garlan Fair, MD;  Location: [facility];  Service: Endoscopy;  Laterality: N/A;  . ESOPHAGOGASTRODUODENOSCOPY (EGD) WITH PROPOFOL N/A 01/30/2014   Procedure: ESOPHAGOGASTRODUODENOSCOPY (EGD) WITH PROPOFOL;  Surgeon: Garlan Fair, MD;  Location: [facility];  Service: Endoscopy;  Laterality: N/A;  . left jaw surgery     . MANDIBLE FRACTURE SURGERY     Social History   Occupational History  . Not on file  Tobacco Use  . Smoking status: Current Some Day Smoker    Types: Cigarettes  . Smokeless tobacco: Never Used  . Tobacco comment: trying  to quit  Substance and Sexual Activity  . Alcohol use: Yes    Alcohol/week: 0.0 oz    Comment: occasional  . Drug use: No  . Sexual activity: Yes    Partners: Female, Male    Comment: patient declines

## 2018-01-14 ENCOUNTER — Ambulatory Visit (HOSPITAL_COMMUNITY)
Admission: RE | Admit: 2018-01-14 | Discharge: 2018-01-14 | Disposition: A | Discharge status: Home / Self Care | Payer: Medicare Other | Source: Ambulatory Visit | Attending: Family Medicine | Admitting: Family Medicine

## 2018-01-14 DIAGNOSIS — Z122 Encounter for screening for malignant neoplasm of respiratory organs: Secondary | ICD-10-CM

## 2018-01-14 DIAGNOSIS — F17210 Nicotine dependence, cigarettes, uncomplicated: Secondary | ICD-10-CM | POA: Diagnosis not present

## 2018-01-14 DIAGNOSIS — I700 Atherosclerosis of aorta: Secondary | ICD-10-CM | POA: Diagnosis not present

## 2018-01-14 DIAGNOSIS — J438 Other emphysema: Secondary | ICD-10-CM | POA: Insufficient documentation

## 2018-01-14 DIAGNOSIS — F17200 Nicotine dependence, unspecified, uncomplicated: Secondary | ICD-10-CM

## 2018-01-14 DIAGNOSIS — K760 Fatty (change of) liver, not elsewhere classified: Secondary | ICD-10-CM | POA: Insufficient documentation

## 2018-01-14 DIAGNOSIS — I2510 Atherosclerotic heart disease of native coronary artery without angina pectoris: Secondary | ICD-10-CM | POA: Diagnosis not present

## 2018-01-21 ENCOUNTER — Telehealth: Payer: Self-pay

## 2018-01-21 NOTE — Telephone Encounter (Signed)
Patient was called and informed of CT scan results.

## 2018-01-21 NOTE — Telephone Encounter (Signed)
-----   Message from Charlott Rakes, MD sent at 01/17/2018  5:28 PM EDT ----- CT chest is negative for malignancy but reveals emphysema, fatty liver.  Please encouraged to work on smoking cessation.

## 2018-02-25 MED FILL — TAMSULOSIN HCL 0.4 MG CAP: 0.4 | 30 days supply | Qty: 30 | Fill #1

## 2018-02-25 MED FILL — AMLODIPINE BESYLATE 10 MG T: 10 | 30 days supply | Qty: 30 | Fill #1

## 2018-02-25 MED FILL — OMEPRAZOLE 20 MG CAP: 20 | 30 days supply | Qty: 30 | Fill #1

## 2018-02-25 MED FILL — DICLOFENAC SODIUM 1% GEL: 1 | 12 days supply | Qty: 100 | Fill #1

## 2018-04-06 MED FILL — OMEPRAZOLE 20 MG CAP: 20 | 30 days supply | Qty: 30 | Fill #2

## 2018-04-06 MED FILL — TAMSULOSIN HCL 0.4 MG CAP: 0.4 | 30 days supply | Qty: 30 | Fill #2

## 2018-04-06 MED FILL — AMLODIPINE BESYLATE 10 MG T: 10 | 30 days supply | Qty: 30 | Fill #2

## 2018-04-18 MED FILL — DICLOFENAC SODIUM 1% GEL: 1 | 12 days supply | Qty: 100 | Fill #2

## 2018-05-16 ENCOUNTER — Ambulatory Visit: Payer: Medicare Other | Attending: Family Medicine | Admitting: Family Medicine

## 2018-05-16 ENCOUNTER — Other Ambulatory Visit: Payer: Self-pay

## 2018-05-16 ENCOUNTER — Encounter: Payer: Self-pay | Admitting: Family Medicine

## 2018-05-16 VITALS — BP 106/70 | HR 106 | Temp 98.2°F | Ht 66.0 in | Wt 118.0 lb

## 2018-05-16 DIAGNOSIS — Z79899 Other long term (current) drug therapy: Secondary | ICD-10-CM | POA: Diagnosis not present

## 2018-05-16 DIAGNOSIS — K219 Gastro-esophageal reflux disease without esophagitis: Secondary | ICD-10-CM | POA: Insufficient documentation

## 2018-05-16 DIAGNOSIS — R160 Hepatomegaly, not elsewhere classified: Secondary | ICD-10-CM | POA: Diagnosis not present

## 2018-05-16 DIAGNOSIS — M720 Palmar fascial fibromatosis [Dupuytren]: Secondary | ICD-10-CM | POA: Diagnosis not present

## 2018-05-16 DIAGNOSIS — F1010 Alcohol abuse, uncomplicated: Secondary | ICD-10-CM | POA: Diagnosis not present

## 2018-05-16 DIAGNOSIS — I10 Essential (primary) hypertension: Secondary | ICD-10-CM | POA: Insufficient documentation

## 2018-05-16 DIAGNOSIS — R066 Hiccough: Secondary | ICD-10-CM | POA: Diagnosis not present

## 2018-05-16 DIAGNOSIS — Z7982 Long term (current) use of aspirin: Secondary | ICD-10-CM | POA: Diagnosis not present

## 2018-05-16 DIAGNOSIS — M19049 Primary osteoarthritis, unspecified hand: Secondary | ICD-10-CM | POA: Diagnosis not present

## 2018-05-16 DIAGNOSIS — R55 Syncope and collapse: Secondary | ICD-10-CM | POA: Diagnosis not present

## 2018-05-16 MED ORDER — AMLODIPINE BESYLATE 5 MG PO TABS: 5.0000 mg | ORAL_TABLET | Freq: Every day | ORAL | 6 refills | Status: DC

## 2018-05-16 MED FILL — AMLODIPINE BESYLATE 10 MG T: 10 | 30 days supply | Qty: 30 | Fill #3

## 2018-05-16 MED FILL — TAMSULOSIN HCL 0.4 MG CAP: 0.4 | 30 days supply | Qty: 30 | Fill #3

## 2018-05-16 MED FILL — OMEPRAZOLE 20 MG CAP: 20 | 30 days supply | Qty: 30 | Fill #3

## 2018-05-16 NOTE — Patient Instructions (Signed)
Alcohol Abuse and Nutrition Alcohol abuse is any pattern of alcohol consumption that harms your health, relationships, or work. Alcohol abuse can affect how your body breaks down and absorbs nutrients from food by causing your liver to work abnormally. Additionally, many people who abuse alcohol do not eat enough carbohydrates, protein, fat, vitamins, and minerals. This can cause poor nutrition (malnutrition) and a lack of nutrients (nutrient deficiencies), which can lead to further complications. Nutrients that are commonly lacking (deficient) among people who abuse alcohol include:  Vitamins. ? Vitamin A. This is stored in your liver. It is important for your vision, metabolism, and ability to fight off infections (immunity). ? B vitamins. These include vitamins such as folate, thiamin, and niacin. These are important in new cell growth and maintenance. ? Vitamin C. This plays an important role in iron absorption, wound healing, and immunity. ? Vitamin D. This is produced by your liver, but you can also get vitamin D from food. Vitamin D is necessary for your body to absorb and use calcium.  Minerals. ? Calcium. This is important for your bones and your heart and blood vessel (cardiovascular) function. ? Iron. This is important for blood, muscle, and nervous system functioning. ? Magnesium. This plays an important role in muscle and nerve function, and it helps to control blood sugar and blood pressure. ? Zinc. This is important for the normal function of your nervous system and digestive system (gastrointestinal tract).  Nutrition is an essential component of therapy for alcohol abuse. Your health care provider or dietitian will work with you to design a plan that can help restore nutrients to your body and prevent potential complications. What is my plan? Your dietitian may develop a specific diet plan that is based on your condition and any other complications you may have. A diet plan will  commonly include:  A balanced diet. ? Grains: 6-8 oz per day. ? Vegetables: 2-3 cups per day. ? Fruits: 1-2 cups per day. ? Meat and other protein: 5-6 oz per day. ? Dairy: 2-3 cups per day.  Vitamin and mineral supplements.  What do I need to know about alcohol and nutrition?  Consume foods that are high in antioxidants, such as grapes, berries, nuts, green tea, and dark green and orange vegetables. This can help to counteract some of the stress that is placed on your liver by consuming alcohol.  Avoid food and drinks that are high in fat and sugar. Foods such as sugared soft drinks, salty snack foods, and candy contain empty calories. This means that they lack important nutrients such as protein, fiber, and vitamins.  Eat frequent meals and snacks. Try to eat 5-6 small meals each day.  Eat a variety of fresh fruits and vegetables each day. This will help you get plenty of water, fiber, and vitamins in your diet.  Drink plenty of water and other clear fluids. Try to drink at least 48-64 oz (1.5-2 L) of water per day.  If you are a vegetarian, eat a variety of protein-rich foods. Pair whole grains with plant-based proteins at meals and snacks to obtain the greatest nutrient benefit from your food. For example, eat rice with beans, put peanut butter on whole-grain toast, or eat oatmeal with sunflower seeds.  Soak beans and whole grains overnight before cooking. This can help your body to absorb the nutrients more easily.  Include foods fortified with vitamins and minerals in your diet. Commonly fortified foods include milk, orange juice, cereal, and bread.  If you are malnourished, your dietitian may recommend a high-protein, high-calorie diet. This may include: ? 2,000-3,000 calories (kilocalories) per day. ? 70-100 grams of protein per day.  Your health care provider may recommend a complete nutritional supplement beverage. This can help to restore calories, protein, and vitamins to  your body. Depending on your condition, you may be advised to consume this instead of or in addition to meals.  Limit your intake of caffeine. Replace drinks like coffee and black tea with decaffeinated coffee and herbal tea.  Eat a variety of foods that are high in omega fatty acids. These include fish, nuts and seeds, and soybeans. These foods may help your liver to recover and may also stabilize your mood.  Certain medicines may cause changes in your appetite, taste, and weight. Work with your health care provider and dietitian to make any adjustments to your medicines and diet plan.  Include other healthy lifestyle choices in your daily routine. ? Be physically active. ? Get enough sleep. ? Spend time doing activities that you enjoy.  If you are unable to take in enough food and calories by mouth, your health care provider may recommend a feeding tube. This is a tube that passes through your nose and throat, directly into your stomach. Nutritional supplement beverages can be given to you through the feeding tube to help you get the nutrients you need.  Take vitamin or mineral supplements as recommended by your health care provider. What foods can I eat? Grains Enriched pasta. Enriched rice. Fortified whole-grain bread. Fortified whole-grain cereal. Barley. Brown rice. Quinoa. [place]. Vegetables All fresh, frozen, and canned vegetables. Spinach. Kale. Artichoke. Carrots. Winter squash and pumpkin. Sweet potatoes. Broccoli. Cabbage. Cucumbers. Tomatoes. Sweet peppers. Green beans. Peas. Corn. Fruits All fresh and frozen fruits. Berries. Grapes. Mango. Papaya. Guava. Cherries. Apples. Bananas. Peaches. Plums. Pineapple. Watermelon. Cantaloupe. Oranges. Avocado. Meats and Other Protein Sources Beef liver. Lean beef. Pork. Fresh and canned chicken. Fresh fish. Oysters. Sardines. Canned tuna. Shrimp. Eggs with yolks. Nuts and seeds. Peanut butter. Beans and lentils. Soybeans.  Tofu. Dairy Whole, low-fat, and nonfat milk. Whole, low-fat, and nonfat yogurt. Cottage cheese. Sour cream. Hard and soft cheeses. Beverages Water. Herbal tea. Decaffeinated coffee. Decaffeinated green tea. 100% fruit juice. 100% vegetable juice. Instant breakfast shakes. Condiments Ketchup. Mayonnaise. Mustard. Salad dressing. Barbecue sauce. Sweets and Desserts Sugar-free ice cream. Sugar-free pudding. Sugar-free gelatin. Fats and Oils Butter. Vegetable oil, flaxseed oil, olive oil, and walnut oil. Other Complete nutrition shakes. Protein bars. Sugar-free gum. The items listed above may not be a complete list of recommended foods or beverages. Contact your dietitian for more options. What foods are not recommended? Grains Sugar-sweetened breakfast cereals. Flavored instant oatmeal. Fried breads. Vegetables Breaded or deep-fried vegetables. Fruits Dried fruit with added sugar. Candied fruit. Canned fruit in syrup. Meats and Other Protein Sources Breaded or deep-fried meats. Dairy Flavored milks. Fried cheese curds or fried cheese sticks. Beverages Alcohol. Sugar-sweetened soft drinks. Sugar-sweetened tea. Caffeinated coffee and tea. Condiments Sugar. Honey. Agave nectar. Molasses. Sweets and Desserts Chocolate. Cake. Cookies. Candy. Other Potato chips. Pretzels. Salted nuts. Candied nuts. The items listed above may not be a complete list of foods and beverages to avoid. Contact your dietitian for more information. This information is not intended to replace advice given to you by your health care provider. Make sure you discuss any questions you have with your health care provider. Document Released: 05/14/2005 Document Revised: 11/27/2015 Document Reviewed: 02/20/2014 Elsevier Interactive Patient Education  2018 [employer] Inc.  

## 2018-05-16 NOTE — Progress Notes (Signed)
Subjective:  Patient ID: Calvin Gardner, male    DOB: 01-26-1951  Age: 67 y.o. MRN: [id number]  CC: Hypertension   HPI Calvin Gardner  is a 67 year old male with a history of hypertension, GERD, tobacco and alcohol abuse who presents today for follow-up visit. He was seen by orthopedics-Dr. Erlinda Hong for osteoarthritis of the hand and Dupuytren's contracture for which she was prescribed Voltaren gel which he states has been ineffective.  He complains of two episodes of syncope which occurred on rising from a sitting position and attempting to walk.  He suddenly felt dizzy and then passed out but there was no loss of consciousness.  He denies headache, blurry vision, chest pain, shortness of breath and last episode was 3 weeks ago. On review of his vitals his blood pressure is on the low side today. He complains of persisting hiccups; her try Thorazine and Compazine in the past which were ineffective and he discontinued the medications altogether. In March of this year his ultrasound had revealed a mass in the right lobe of the liver and further evaluation recommended with CT of the liver which was scheduled however he no showed. He endorses significant alcohol consumption and this is more on the weekends when he and his son drank 2 cases of beer.  Past Medical History:  Diagnosis Date  . Cataract   . Hypertension     Past Surgical History:  Procedure Laterality Date  . COLONOSCOPY WITH PROPOFOL N/A 01/30/2014   Procedure: COLONOSCOPY WITH PROPOFOL;  Surgeon: Garlan Fair, MD;  Location: [facility];  Service: Endoscopy;  Laterality: N/A;  . ESOPHAGOGASTRODUODENOSCOPY (EGD) WITH PROPOFOL N/A 01/30/2014   Procedure: ESOPHAGOGASTRODUODENOSCOPY (EGD) WITH PROPOFOL;  Surgeon: Garlan Fair, MD;  Location: [facility];  Service: Endoscopy;  Laterality: N/A;  . left jaw surgery     . MANDIBLE FRACTURE SURGERY      No Known Allergies   Outpatient Medications Prior to Visit    Medication Sig Dispense Refill  . aspirin EC 81 MG tablet Take 81 mg by mouth every morning. Reported on 12/19/2015    . omeprazole (PRILOSEC) 20 MG capsule Take 1 capsule (20 mg total) by mouth daily. 30 capsule 6  . tamsulosin (FLOMAX) 0.4 MG CAPS capsule Take 1 capsule (0.4 mg total) by mouth daily. 30 capsule 6  . amLODipine (NORVASC) 10 MG tablet Take 1 tablet (10 mg total) by mouth daily. 30 tablet 6  . cholecalciferol (VITAMIN D) 400 units TABS tablet Take 400 Units by mouth. Reported on 12/19/2015    . diclofenac sodium (VOLTAREN) 1 % GEL Apply 2 g topically 4 (four) times daily. (Patient not taking: Reported on 05/16/2018) 1 Tube 5   No facility-administered medications prior to visit.     ROS Review of Systems  Constitutional: Negative for activity change and appetite change.  HENT: Negative for sinus pressure and sore throat.   Eyes: Negative for visual disturbance.  Respiratory: Negative for cough, chest tightness and shortness of breath.   Cardiovascular: Negative for chest pain and leg swelling.  Gastrointestinal: Negative for abdominal distention, abdominal pain, constipation and diarrhea.  Endocrine: Negative.   Genitourinary: Negative for dysuria.  Musculoskeletal: Negative for joint swelling and myalgias.  Skin: Negative for rash.  Allergic/Immunologic: Negative.   Neurological: Negative for weakness, light-headedness and numbness.  Psychiatric/Behavioral: Negative for dysphoric mood and suicidal ideas.    Objective:  BP 106/70   Pulse (!) 106   Temp 98.2 F (36.8 C) (Oral)  Ht 5' 6"  (1.676 m)   Wt 118 lb (53.5 kg)   SpO2 99%   BMI 19.05 kg/m   BP/Weight 05/16/2018 03/05/2335 08/05/2447  Systolic BP 753 005 110  Diastolic BP 70 68 93  Wt. (Lbs) 118 121.4 127.8  BMI 19.05 19.59 20.63      Physical Exam  Constitutional: He is oriented to person, place, and time. He appears well-developed and well-nourished.  Cardiovascular: Normal rate, normal heart  sounds and intact distal pulses.  No murmur heard. Pulmonary/Chest: Effort normal and breath sounds normal. He has no wheezes. He has no rales. He exhibits no tenderness.  Abdominal: Soft. Bowel sounds are normal. He exhibits no distension and no mass. There is no tenderness.  Musculoskeletal: Normal range of motion.  Neurological: He is alert and oriented to person, place, and time.  Skin: Skin is warm and dry.  Psychiatric: He has a normal mood and affect.    CLINICAL DATA:  Follow-up cirrhosis  EXAM: ULTRASOUND ABDOMEN LIMITED RIGHT UPPER QUADRANT  COMPARISON:  October 05, 2014  FINDINGS: Gallbladder:  No gallstones or wall thickening visualized. No sonographic Murphy sign noted by sonographer.  Common bile duct:  Diameter: 4.7 mm  Liver:  There is coarse increased echotexture of the liver. There is a 6.2 x 6.1 x 6.2 cm complex echotexture mass in the liver. Portal vein is patent on color Doppler imaging with normal direction of blood flow towards the liver.  IMPRESSION: Mass in the right lobe liver. Further evaluation with three-phase liver CT is recommended.   Electronically Signed   By: Abelardo Diesel M.D.   On: 10/14/2017 10:16  Assessment & Plan:   1. Essential hypertension, benign His blood pressure is on the low side Hypotension could have contributed to his passing out Reduced amlodipine from 10 mg to 5 mg - amLODipine (NORVASC) 5 MG tablet; Take 1 tablet (5 mg total) by mouth daily.  Dispense: 30 tablet; Refill: 6 - [id number]  2. Hiccough Uncontrolled Has tried Compazine and Thorazine in the past with no relief We will need to exclude underlying liver malignancy  3. Alcohol abuse Strongly encouraged to quit  4. Liver mass, right lobe Liver mass evident on ultrasound and CT scan of the liver ordered which he never underwent We will order again today and I have stressed the importance of keeping this appointment He is at high risk for  [facility] - CT LIVER ABD W/WO; Future - AFP tumor marker - CBC with Differential/Platelet  5.  Syncope Likely from hypotension -reduce antihypertensive dose EKG unremarkable Advised to change positions slowly  Meds ordered this encounter  Medications  . amLODipine (NORVASC) 5 MG tablet    Sig: Take 1 tablet (5 mg total) by mouth daily.    Dispense:  30 tablet    Refill:  6    Follow-up: Return in about 6 weeks (around 06/27/2018) for follow up of liver mass.   Charlott Rakes MD

## 2018-05-16 NOTE — Progress Notes (Signed)
Patient has passed out twice.

## 2018-05-17 ENCOUNTER — Other Ambulatory Visit: Payer: Self-pay | Admitting: Family Medicine

## 2018-05-17 DIAGNOSIS — R160 Hepatomegaly, not elsewhere classified: Secondary | ICD-10-CM

## 2018-05-17 LAB — CBC WITH DIFFERENTIAL/PLATELET
Basophils Absolute: 0.1 10*3/uL (ref 0.0–0.2)
Basos: 1 %
EOS (ABSOLUTE): 0 10*3/uL (ref 0.0–0.4)
EOS: 0 %
HEMOGLOBIN: 13 g/dL (ref 13.0–17.7)
Hematocrit: 39.1 % (ref 37.5–51.0)
Immature Grans (Abs): 0.1 10*3/uL (ref 0.0–0.1)
Immature Granulocytes: 1 %
LYMPHS ABS: 1 10*3/uL (ref 0.7–3.1)
Lymphs: 9 %
MCH: 30.2 pg (ref 26.6–33.0)
MCHC: 33.2 g/dL (ref 31.5–35.7)
MCV: 91 fL (ref 79–97)
MONOCYTES: 7 %
Monocytes Absolute: 0.7 10*3/uL (ref 0.1–0.9)
NEUTROS ABS: 8.9 10*3/uL — AB (ref 1.4–7.0)
NEUTROS PCT: 82 %
PLATELETS: 199 10*3/uL (ref 150–450)
RBC: 4.31 x10E6/uL (ref 4.14–5.80)
RDW: 12.1 % — ABNORMAL LOW (ref 12.3–15.4)
WBC: 10.7 10*3/uL (ref 3.4–10.8)

## 2018-05-17 LAB — CMP14+EGFR
ALK PHOS: 66 IU/L (ref 39–117)
ALT: 80 IU/L — AB (ref 0–44)
AST: 185 IU/L — AB (ref 0–40)
Albumin/Globulin Ratio: 1.9 (ref 1.2–2.2)
Albumin: 4.9 g/dL — ABNORMAL HIGH (ref 3.6–4.8)
BUN/Creatinine Ratio: 11 (ref 10–24)
BUN: 10 mg/dL (ref 8–27)
Bilirubin Total: 0.8 mg/dL (ref 0.0–1.2)
CALCIUM: 10.1 mg/dL (ref 8.6–10.2)
CO2: 16 mmol/L — ABNORMAL LOW (ref 20–29)
CREATININE: 0.87 mg/dL (ref 0.76–1.27)
Chloride: 94 mmol/L — ABNORMAL LOW (ref 96–106)
GFR calc Af Amer: 105 mL/min/{1.73_m2} (ref >59)
GFR, EST NON AFRICAN AMERICAN: 91 mL/min/{1.73_m2} (ref >59)
Globulin, Total: 2.6 g/dL (ref 1.5–4.5)
Glucose: 58 mg/dL — ABNORMAL LOW (ref 65–99)
POTASSIUM: 4.9 mmol/L (ref 3.5–5.2)
Sodium: 136 mmol/L (ref 134–144)
Total Protein: 7.5 g/dL (ref 6.0–8.5)

## 2018-05-17 LAB — AFP TUMOR MARKER: AFP, Serum, Tumor Marker: 8.9 ng/mL — ABNORMAL HIGH (ref 0.0–8.3)

## 2018-05-19 ENCOUNTER — Telehealth: Payer: Self-pay

## 2018-05-19 NOTE — Telephone Encounter (Signed)
Spoke to pt and explained that liver enzymes are elevated and that the doctor said it may be from drinking alcohol and she asks him to not drink.  Nurse asked pt if that was possible and he said he did not know whether he could do that.  Encouragement given to try cutting daily amount in half as a goal for now.  Talked with pt about what severe liver disease is like and encouraged him to decrease as much as he is able and not to quit trying. Reviewed appointment for the CT and that a GI consult has been made.  Encouraged pt to call us if he has any questions or problems with the above.  He agreed.

## 2018-05-20 ENCOUNTER — Encounter: Payer: Self-pay | Admitting: Gastroenterology

## 2018-05-24 ENCOUNTER — Ambulatory Visit (HOSPITAL_COMMUNITY)
Admission: RE | Admit: 2018-05-24 | Discharge: 2018-05-24 | Disposition: A | Discharge status: Home / Self Care | Payer: Medicare Other | Source: Ambulatory Visit | Attending: Family Medicine | Admitting: Family Medicine

## 2018-05-24 DIAGNOSIS — R911 Solitary pulmonary nodule: Secondary | ICD-10-CM | POA: Diagnosis not present

## 2018-05-24 DIAGNOSIS — R160 Hepatomegaly, not elsewhere classified: Secondary | ICD-10-CM

## 2018-05-24 DIAGNOSIS — I700 Atherosclerosis of aorta: Secondary | ICD-10-CM | POA: Insufficient documentation

## 2018-05-24 DIAGNOSIS — K7689 Other specified diseases of liver: Secondary | ICD-10-CM | POA: Diagnosis not present

## 2018-05-24 MED ORDER — IOHEXOL 300 MG/ML  SOLN
100.0000 mL | Freq: Once | INTRAMUSCULAR | Status: AC | PRN
  Administered 2018-05-24: 100 mL via INTRAVENOUS

## 2018-05-25 ENCOUNTER — Encounter: Payer: Self-pay | Admitting: Family Medicine

## 2018-05-25 ENCOUNTER — Other Ambulatory Visit: Payer: Self-pay | Admitting: Family Medicine

## 2018-05-25 DIAGNOSIS — R911 Solitary pulmonary nodule: Secondary | ICD-10-CM | POA: Insufficient documentation

## 2018-05-31 ENCOUNTER — Encounter: Payer: Self-pay | Admitting: Gastroenterology

## 2018-05-31 ENCOUNTER — Other Ambulatory Visit (INDEPENDENT_AMBULATORY_CARE_PROVIDER_SITE_OTHER): Payer: Medicare Other

## 2018-05-31 ENCOUNTER — Ambulatory Visit (INDEPENDENT_AMBULATORY_CARE_PROVIDER_SITE_OTHER): Payer: Medicare Other | Admitting: Gastroenterology

## 2018-05-31 ENCOUNTER — Ambulatory Visit (HOSPITAL_COMMUNITY)
Admission: RE | Admit: 2018-05-31 | Discharge: 2018-05-31 | Disposition: A | Discharge status: Home / Self Care | Payer: Medicare Other | Source: Ambulatory Visit | Attending: Gastroenterology | Admitting: Gastroenterology

## 2018-05-31 ENCOUNTER — Telehealth: Payer: Self-pay

## 2018-05-31 VITALS — BP 100/80 | HR 64 | Ht 64.57 in | Wt 119.2 lb

## 2018-05-31 DIAGNOSIS — R066 Hiccough: Secondary | ICD-10-CM | POA: Diagnosis not present

## 2018-05-31 DIAGNOSIS — K769 Liver disease, unspecified: Secondary | ICD-10-CM | POA: Insufficient documentation

## 2018-05-31 DIAGNOSIS — Z800 Family history of malignant neoplasm of digestive organs: Secondary | ICD-10-CM

## 2018-05-31 DIAGNOSIS — R160 Hepatomegaly, not elsewhere classified: Secondary | ICD-10-CM | POA: Diagnosis not present

## 2018-05-31 DIAGNOSIS — K709 Alcoholic liver disease, unspecified: Secondary | ICD-10-CM

## 2018-05-31 LAB — BILIRUBIN, TOTAL: BILIRUBIN TOTAL: 0.7 mg/dL (ref 0.2–1.2)

## 2018-05-31 LAB — SODIUM: SODIUM: 135 meq/L (ref 135–145)

## 2018-05-31 LAB — PROTIME-INR
INR: 1 ratio (ref 0.8–1.0)
Prothrombin Time: 11.2 s (ref 9.6–13.1)

## 2018-05-31 LAB — CREATININE, SERUM: CREATININE: 0.73 mg/dL (ref 0.40–1.50)

## 2018-05-31 MED ORDER — OMEPRAZOLE 40 MG PO CPDR: 40.0000 mg | DELAYED_RELEASE_CAPSULE | Freq: Every day | ORAL | 3 refills | Status: DC

## 2018-05-31 MED ORDER — GADOBUTROL 1 MMOL/ML IV SOLN
5.0000 mL | Freq: Once | INTRAVENOUS | Status: AC | PRN
  Administered 2018-05-31: 5 mL via INTRAVENOUS

## 2018-05-31 MED FILL — OMEPRAZOLE DR 40 MG CAPSULE: 40 | 90 days supply | Qty: 90 | Fill #0

## 2018-05-31 NOTE — Telephone Encounter (Signed)
-----   Message from Charlott Rakes, MD sent at 05/25/2018  1:22 PM EDT ----- His CT reveals a liver mass concerning for cancer.  I had referred him to GI and I would need him to keep this appointment.  Please encouraged to quit alcohol as he does have cirrhosis all of which place him at high risk for liver cancer.

## 2018-05-31 NOTE — Progress Notes (Addendum)
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 Now with a 6 cm liver mass identified initially in March. It is hypervascular and has delayed washout.   It has slightly increased in size on a recent liver CT. It was not present on an ultrasound in 2016. Despite his normal AFP, this is worrisome for hepatocellular carcinoma.  Will proceed with MRI with and without contrast to help clarify. Should be able to diagnosis without liver biopsy. He had a colonoscopy within the last few years per his report and no mass or polyp was identified. If the liver mass seen on recent imaging appears malignant, will proceed with staging imaging studies.  It will be critical to understand the extent of his advanced liver disease to determine if resection is an option. I am recommending both labs and an EGD to screen for esophageal varices/portal hypertension.   He declined referral to a tertiary care center due to transportation limitations.   We reviewed the natural history of liver  disease with and without hepatoma. Abstinence from alcohol with formal alcohol counseling recommended. He has fixed beliefs that beer does not cause the health problems that other forms of alcohol may.   Hiccups have been associated with malignancy, including hepatocellular carcinoma.   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 There is no vascular invasion, no portal vein thrombus.  He has well preserved liver function. 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 There is no evidence for portal hypertension and his platelets are higher than we would normally see if varices are present. An EGD is planned for confirmation.    He is not a candidate for transplant by [facility] or [facility] criteria. His lack of reliable transportation may effect his candidacy even if the tumor could be successfully ablated. Given these results, we should consider surgical resection versus IR for ablation. Although the tumor is large, will want to I will try to present his information at tumor board (I am new to [facility] and need to determine how to get him on the schedule). Will work on referrals to Oncology for recommendations regarding additional staging, Dr. Stark Klein at [facility], and Dr. Kathlene Cote with [place].   PLAN: Labs today to calculate a MELD score: Na, Crt, Total bilirubin, INR Labs to exclude concurrent liver disease: HBV  DNA viral load, iron, ferritin, ANA, IgG MRI of the liver with and without contrast EGD to screen for portal hypertension Obtain results from EGD and colonoscopy (Pt reports performed at [facility] 3-4 years ago) Increase omeprazole to 40 mg daily given his recent hiccups Consider a trial of baclofen if his hiccups do not improve on higher dose PPI Abstain from all alcohol. Formal counseling recommending He declined  consultation at a tertiary care center due to transportation (uses a bike) Return to this clinic after the MRI  HPI: Calvin Gardner is a 67 y.o. male seen in consultation at the request of Dr. Margarita Rana for further evaluation of liver mass.  The history is obtained through the patient and review of his electronic health record. He has HTN, GERD, tobacco use, and alcohol abuse. He has Dupuytren's contracture.   He had an ultrasound in March that revealed a 6.2 x 6.1 x 6.2 cm mass in the right lobe of the liver.  Follow-up with a CT scan was recommended however he no showed for that appointment.  CT of the liver abdomen with and without contrast on 05/24/2018 showed a 69m pulmonary nodule in the right middle lobe new from 01/14/2018.  Diffuse hepatic steatosis.  A large hypervascular mass measuring 6.7 x 6.6 x 7.2 cm.  Morphologic features suggestive of early cirrhosis are noted in the conclusions.  No associated symptoms.  No identified exacerbating or relieving features.  He drinks beer every other day. He will drink 3-4 40 ounce beers when he drinks.  On the weekends he and his son will drink 2 cases of beer.  No history of alcohol problems. Denies a problem with alcohol. Doctors have asked him to quit. He was sober for 3 months during his hepatitis C treatment. No history of seizure or withdrawals.   Recent complaints of syncope when he "passed out" twice. He is orthostatic with position changes, particularly when getting up off the cough.  Persistent hiccups for months ineffectively treated with Thorazine and Compazine. He has developed anorexia.  He has lost an unspecified amount of weight. He does not weigh himself so the extent is unknown. However, he notes of loss of muscle mass. Is able to wear the same clothes.   Labs from 05/16/2018 show a sodium 136, creatinine 0.87, total bilirubin 0.8, AST 185, ALT 80, albumin 4.9, alk phos 66.  Platelets are 199, hemoglobin 13.0.  AFP 8.9.  Labs from  2016 show hep B surface antigen negative, hep B core antibody total positive, hep B surface antibody indeterminant, hep B DNA not detected, HIV nonreactive.  No prior knowledge of liver disease. No prior liver biopsy. No prior blood donation >20 years ago.  No prior blood transfusion. History of hepatitis C treated in 2016. Underwent treatment to "prevent getting liver damage."  No history of jaundice or scleral icterus. Distant use of heroine at age 67  No family history of liver disease.  He is not aware of HAV or HBV vaccination. No prior seasonal flu or Pneumovax.   No colon cancer screening.   Past Medical History:  Diagnosis Date  . [place] ([place]   . Aortic atherosclerosis ([place]   . Arthritis   . Cataract   . Hepatitis C   . HLD (hyperlipidemia)   . Hypertension   . Liver mass   . Pulmonary nodule     Past Surgical History:  Procedure Laterality Date  . COLONOSCOPY WITH PROPOFOL N/A 01/30/2014   Procedure: COLONOSCOPY WITH PROPOFOL;  Surgeon: Garlan Fair, MD;  Location: [facility];  Service: Endoscopy;  Laterality: N/A;  . ESOPHAGOGASTRODUODENOSCOPY (EGD) WITH PROPOFOL N/A 01/30/2014   Procedure: ESOPHAGOGASTRODUODENOSCOPY (EGD) WITH PROPOFOL;  Surgeon: Garlan Fair, MD;  Location: [facility];  Service: Endoscopy;  Laterality: N/A;  . left jaw surgery     . MANDIBLE FRACTURE SURGERY      Prior to Admission medications   Medication Sig Start Date End Date Taking? Authorizing Provider  amLODipine (NORVASC) 5 MG tablet Take 1 tablet (5 mg total) by mouth daily. 05/16/18   Charlott Rakes, MD  aspirin EC 81 MG tablet Take 81 mg by mouth every morning. Reported on 12/19/2015    [provider]  cholecalciferol (VITAMIN D) 400 units TABS tablet Take 400 Units by mouth. Reported on 12/19/2015    [provider]  diclofenac sodium (VOLTAREN) 1 % GEL Apply 2 g topically 4 (four) times daily. Patient not taking: Reported on 05/16/2018 01/12/18   Leandrew Koyanagi, MD  omeprazole (PRILOSEC) 20 MG capsule Take 1 capsule (20 mg total) by mouth daily. 01/10/18   Charlott Rakes, MD  tamsulosin (FLOMAX) 0.4 MG CAPS capsule Take 1 capsule (0.4 mg total) by mouth daily. 01/10/18   Charlott Rakes, MD    Current Outpatient Medications  Medication Sig Dispense Refill  . amLODipine (NORVASC) 5 MG tablet Take 1 tablet (5 mg total) by mouth daily. 30 tablet 6  . aspirin EC 81 MG tablet Take 81 mg by mouth every morning. Reported on 12/19/2015    . diclofenac sodium (VOLTAREN) 1 % GEL Apply 2 g topically 4 (four) times daily. 1 Tube 5  . tamsulosin (FLOMAX) 0.4 MG CAPS capsule Take 1 capsule (0.4 mg total) by mouth daily. 30 capsule 6  . omeprazole (PRILOSEC) 40 MG capsule Take 1 capsule (40 mg total) by mouth daily. 60 capsule 3   No current facility-administered medications for this visit.     Allergies as of 05/31/2018  . (No Known Allergies)    Family History  Problem Relation Age of Onset  . Cancer Mother        type unknown  . Emphysema Brother     Social History   Socioeconomic History  . Marital status: Single    Spouse name: Not on file  . Number of children: 1  . Years of education: Not on file  . Highest education level: Not on file  Occupational History  . Not on file  Social Needs  . Financial resource strain: Not on file  . Food insecurity:    Worry: Not on file    Inability: Not on file  . Transportation needs:    Medical: Not on file    Non-medical: Not on file  Tobacco Use  . Smoking status: Current Some Day Smoker    Types: Cigarettes  . Smokeless tobacco: Never Used  . Tobacco comment: trying to quit, smoke every other day  Substance and Sexual Activity  . Alcohol use: Yes    Alcohol/week: 0.0 standard drinks    Comment: occasional-beer only  . Drug use: No  . Sexual activity: Yes    Partners: Female, Male    Comment: patient declines  Lifestyle  . Physical activity:    Days per week: Not on file     Minutes per session: Not on file  . Stress: Not on file  Relationships  . Social connections:    Talks on phone: Not on file  Gets together: Not on file    Attends religious service: Not on file    Active member of club or organization: Not on file    Attends meetings of clubs or organizations: Not on file    Relationship status: Not on file  . Intimate partner violence:    Fear of current or ex partner: Not on file    Emotionally abused: Not on file    Physically abused: Not on file    Forced sexual activity: Not on file  Other Topics Concern  . Not on file  Social History Narrative  . Not on file    Review of Systems: 12 system ROS is negative except as noted above except for back pain, night sweats, and excessive urination.   Physical Exam: Vital signs were reviewed. General:   Alert, in NAD. No scleral icterus. No bilateral temporal wasting. Looks older than his stated age.  Heart:  Regular rate and rhythm; no murmurs Pulm: Clear anteriorly; no wheezing Abdomen:  Soft. Nontender. The liver edge is palpable two fingerbreaths below the costal margin in the midclavicular line. Nondistended. Normal bowel sounds. No rebound or guarding. No fluid wave.  LAD: No inguinal or umbilical LAD Extremities:  Without edema. Neurologic:  Alert and  oriented x4;  grossly normal neurologically; no asterixis or clonus. Skin: No jaundice. No palmar erythema or spider angioma.  Psych:  Alert and cooperative. Normal mood and affect.   Rivers Gassmann L. Tarri Glenn Md, MPH [facility] 05/31/2018, 2:32 PM

## 2018-05-31 NOTE — Telephone Encounter (Signed)
Patient was called and informed of CT scan results and to make sure to keep appointment with GI.

## 2018-05-31 NOTE — Patient Instructions (Signed)
Increase Omeprazole to 40 mg daily.   Abstain from alcohol.   Your provider has requested that you go to the basement level for lab work before leaving today. Press "B" on the elevator. The lab is located at the first door on the left as you exit the elevator.  You have been scheduled for an MRI at [facility], located at [place]. Calvin Gardner in the [facility]. Your appointment is scheduled on 05-31-18 at 1:00 pm. Please arrive 15 minutes prior to your appointment time for registration purposes. Please make certain not to have anything to eat or drink 6 hours prior to your test. In addition, if you have any metal in your body, have a pacemaker or defibrillator, please be sure to let your ordering physician know. This test typically takes 45 minutes to 1 hour to complete. Should you need to reschedule, please call [phone].  You have been scheduled for an endoscopy. Please follow written instructions given to you at your visit today. If you use inhalers (even only as needed), please bring them with you on the day of your procedure. Your physician has requested that you go to www.startemmi.com and enter the access code given to you at your visit today. This web site gives a general overview about your procedure. However, you should still follow specific instructions given to you by our office regarding your preparation for the procedure.

## 2018-06-01 ENCOUNTER — Other Ambulatory Visit: Payer: Self-pay

## 2018-06-01 ENCOUNTER — Encounter: Payer: Self-pay | Admitting: Nurse Practitioner

## 2018-06-01 ENCOUNTER — Telehealth: Payer: Self-pay | Admitting: Nurse Practitioner

## 2018-06-01 DIAGNOSIS — C229 Malignant neoplasm of liver, not specified as primary or secondary: Secondary | ICD-10-CM

## 2018-06-01 NOTE — Telephone Encounter (Signed)
New referral received from Dr. Deland Pretty at [facility] for liver mass. Pt has been cld and scheduled to see Leatrice Jewels on 11/7 at 130pm.  Letter mailed.   Pt stated that he has issues with transportation to get to his doctors appointments. I sent a msg to the transportation coordinator for assistance.

## 2018-06-09 ENCOUNTER — Encounter: Payer: Self-pay | Admitting: Nurse Practitioner

## 2018-06-09 ENCOUNTER — Inpatient Hospital Stay: Payer: Medicare Other | Attending: Nurse Practitioner | Admitting: Nurse Practitioner

## 2018-06-09 ENCOUNTER — Telehealth: Payer: Self-pay | Admitting: Hematology

## 2018-06-09 VITALS — BP 131/92 | HR 77 | Temp 98.7°F | Resp 18 | Ht 64.57 in | Wt 119.1 lb

## 2018-06-09 DIAGNOSIS — Z79899 Other long term (current) drug therapy: Secondary | ICD-10-CM

## 2018-06-09 DIAGNOSIS — F17210 Nicotine dependence, cigarettes, uncomplicated: Secondary | ICD-10-CM | POA: Insufficient documentation

## 2018-06-09 DIAGNOSIS — R160 Hepatomegaly, not elsewhere classified: Secondary | ICD-10-CM

## 2018-06-09 DIAGNOSIS — I10 Essential (primary) hypertension: Secondary | ICD-10-CM | POA: Insufficient documentation

## 2018-06-09 DIAGNOSIS — F1010 Alcohol abuse, uncomplicated: Secondary | ICD-10-CM | POA: Diagnosis not present

## 2018-06-09 DIAGNOSIS — Z7982 Long term (current) use of aspirin: Secondary | ICD-10-CM | POA: Diagnosis not present

## 2018-06-09 DIAGNOSIS — C220 Liver cell carcinoma: Secondary | ICD-10-CM

## 2018-06-09 DIAGNOSIS — B1920 Unspecified viral hepatitis C without hepatic coma: Secondary | ICD-10-CM | POA: Diagnosis not present

## 2018-06-09 HISTORY — DX: Liver cell carcinoma: C22.0

## 2018-06-09 NOTE — Telephone Encounter (Signed)
Appts scheduled letter/calendar mailed per 11/7 los

## 2018-06-09 NOTE — Progress Notes (Addendum)
[place]  [phone] [phone]) [phone]  Clinic New Consult Note   Patient Care Team: Charlott Rakes, MD as PCP - General (Family Medicine) 06/09/2018  CHIEF COMPLAINTS/PURPOSE OF CONSULTATION:  Liver mass, compatible with [facility]; consult requested by Dr. Tarri Glenn GI  HISTORY OF PRESENTING ILLNESS:  Calvin Gardner 67 y.o. 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 Linus Salmons with Rozann Lesches including ribavirin. Abdominal US in 2016 suggested hepatic steatosis and/or mild cirrhosis without mass. A follow up US ordered by PCP in 10/14/17 showed 6.2 x 6.1 x 6.2 complex echotexture mass in the liver. Portal vein was patent. CT was recommended. Attempts were made to inform the patient and schedule subsequent testing. He underwent CT chest for lung cancer screening in 01/2018 which showed tiny calcified and noncalcified lung nodules bilaterally. He presented back to PCP in 05/2018 to evaluate the liver mass, he noted 30 lbs weight loss over 1 year that began when his girlfriend died. Denies associated pain, n/v, bloating, or distention. CT liver/abdomen on 10/22 showed a large hypervascular mass measuring 6.7 x 6.6 x 7.2 with delayed washout as well as new 6 mm pulmonary nodule in the right middle lobe. He was referred to GI Dr. Tarri Glenn who ordered Calvin liver w/wo contrast on 10/29 showing 7.4 x6.8 x7.1 cm enhancing mass in segment 6, however the image was motion degraded; this was categorized as LI-RADS 5 lesion. AFP slightly elevated 8.9.  Past medical history is positive for GERD on PPI, HTN on amlodipine, and treated HCV in 2016. 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 He was previously incarcerated. He denies blood transfusion. He reports alcohol use dating back 50 years, currently drinks 2-3 40 oz beers per day. He smokes 1 PPD x50 years. 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 He does not drive, does not have a car or license. His son brought him today. His 1 son is healthy. He lives alone, independent of ADLs. He is not physically active but does walk for exercise occasionally. Family history of cancer is positive in his mother, but type is unknown.   Today he feels well. He has approx 20 lbs unintentional weight loss over 1 year. He denies abdominal pain, bloating, or distention. Denies n/v/c/d. Denies blood in stool. Denies recent fever, chills, cough, chest pain, dyspnea, leg edema, or pain.    MEDICAL HISTORY:  Past Medical History:  Diagnosis Date  . Alcoholism ([place])   . Aortic atherosclerosis ([place])   . Arthritis   . Cataract   . Hepatitis C   . HLD (hyperlipidemia)   . Hypertension   . Liver mass   . Pulmonary nodule     SURGICAL HISTORY: Past Surgical History:  Procedure Laterality Date  . COLONOSCOPY WITH PROPOFOL N/A 01/30/2014   Procedure: COLONOSCOPY WITH PROPOFOL;  Surgeon: Garlan Fair, MD;  Location: [facility];  Service: Endoscopy;  Laterality: N/A;  . ESOPHAGOGASTRODUODENOSCOPY (EGD) WITH PROPOFOL N/A 01/30/2014   Procedure: ESOPHAGOGASTRODUODENOSCOPY (EGD) WITH PROPOFOL;  Surgeon: Garlan Fair, MD;  Location: [facility];  Service: Endoscopy;  Laterality: N/A;  . left jaw surgery     . MANDIBLE FRACTURE SURGERY      SOCIAL HISTORY: Social History   Socioeconomic History  . Marital status: Single    Spouse name: Not on file  . Number of children:  1  . Years of education: Not on file  . Highest education level: Not on file  Occupational History  . Occupation: works in Public relations account executive  . Financial resource strain: Not on file  . Food insecurity:    Worry: Not on file    Inability: Not on file  . Transportation needs:    Medical: Not on file    Non-medical: Not on file  Tobacco Use  . Smoking status: Current Some Day Smoker    Packs/day: 1.00    Years: 50.00    Pack years: 50.00    Types:  Cigarettes  . Smokeless tobacco: Never Used  . Tobacco comment: trying to quit, smoke every other day  Substance and Sexual Activity  . Alcohol use: Yes    Alcohol/week: 0.0 standard drinks    Comment: 2-3 40 oz beers per day, x50 years  . Drug use: No    Comment: remote IV drug use age 32   . Sexual activity: Yes    Partners: Female, Male    Comment: patient declines  Lifestyle  . Physical activity:    Days per week: Not on file    Minutes per session: Not on file  . Stress: Not on file  Relationships  . Social connections:    Talks on phone: Not on file    Gets together: Not on file    Attends religious service: Not on file    Active member of club or organization: Not on file    Attends meetings of clubs or organizations: Not on file    Relationship status: Not on file  . Intimate partner violence:    Fear of current or ex partner: Not on file    Emotionally abused: Not on file    Physically abused: Not on file    Forced sexual activity: Not on file  Other Topics Concern  . Not on file  Social History Narrative  . Not on file    FAMILY HISTORY: Family History  Problem Relation Age of Onset  . Cancer Mother        type unknown  . Emphysema Brother     ALLERGIES:  has No Known Allergies.  MEDICATIONS:  Current Outpatient Medications  Medication Sig Dispense Refill  . amLODipine (NORVASC) 5 MG tablet Take 1 tablet (5 mg total) by mouth daily. 30 tablet 6  . aspirin EC 81 MG tablet Take 81 mg by mouth every morning. Reported on 12/19/2015    . diclofenac sodium (VOLTAREN) 1 % GEL Apply 2 g topically 4 (four) times daily. 1 Tube 5  . omeprazole (PRILOSEC) 40 MG capsule Take 1 capsule (40 mg total) by mouth daily. 60 capsule 3  . tamsulosin (FLOMAX) 0.4 MG CAPS capsule Take 1 capsule (0.4 mg total) by mouth daily. 30 capsule 6   No current facility-administered medications for this visit.     REVIEW OF SYSTEMS:   Constitutional: Denies fevers, chills or abnormal  night sweats (+) unintentional weight loss  Eyes: Denies blurriness of vision, double vision or watery eyes Ears, nose, mouth, throat, and face: Denies mucositis or sore throat Respiratory: Denies cough, dyspnea or wheezes Cardiovascular: Denies palpitation, chest discomfort or lower extremity swelling Gastrointestinal:  Denies nausea, vomiting, constipation, diarrhea, hematochezia, heartburn or change in bowel habits Skin: Denies abnormal skin rashes Lymphatics: Denies new lymphadenopathy or easy bruising Neurological:Denies numbness, tingling or new weaknesses Behavioral/Psych: Mood is stable, no new changes  All other systems were reviewed  with the patient and are negative.  PHYSICAL EXAMINATION: ECOG PERFORMANCE STATUS: 0 - Asymptomatic  Vitals:   06/09/18 1354  BP: (!) 131/92  Pulse: 77  Resp: 18  Temp: 98.7 F (37.1 C)  SpO2: 99%   Filed Weights   06/09/18 1354  Weight: 119 lb 1.6 oz (54 kg)    GENERAL:alert, no distress and comfortable SKIN: no rashes or significant lesions EYES: sclera clear OROPHARYNX:no thrush or ulcers  LYMPH:  no palpable cervical, supraclavicular, or axillary lymphadenopathy LUNGS: clear to auscultation with normal breathing effort HEART: regular rate & rhythm, no lower extremity edema ABDOMEN:abdomen soft, non-tender and normal bowel sounds. Liver is palpable slightly below costal margin. No splenomegaly  PSYCH: alert & oriented x 3 with fluent speech NEURO: no focal motor deficits  LABORATORY DATA:  I have reviewed the data as listed CBC Latest Ref Rng & Units 05/16/2018 02/28/2017 02/12/2015  WBC 3.4 - 10.8 x10E3/uL 10.7 7.5 7.3  Hemoglobin 13.0 - 17.7 g/dL 13.0 13.7 13.4  Hematocrit 37.5 - 51.0 % 39.1 40.1 42.7  Platelets 150 - 450 x10E3/uL 199 229 218   CMP Latest Ref Rng & Units 05/31/2018 05/16/2018 10/07/2017  Glucose 65 - 99 mg/dL - 58(L) 106(H)  BUN 8 - 27 mg/dL - 10 6(L)  Creatinine 0.40 - 1.50 mg/dL 0.73 0.87 0.74(L)  Sodium  135 - 145 mEq/L 135 136 137  Potassium 3.5 - 5.2 mmol/L - 4.9 4.3  Chloride 96 - 106 mmol/L - 94(L) 98  CO2 20 - 29 mmol/L - 16(L) 23  Calcium 8.6 - 10.2 mg/dL - 10.1 10.4(H)  Total Protein 6.0 - 8.5 g/dL - 7.5 7.5  Total Bilirubin 0.2 - 1.2 mg/dL 0.7 0.8 0.9  Alkaline Phos 39 - 117 IU/L - 66 59  AST 0 - 40 IU/L - 185(H) 93(H)  ALT 0 - 44 IU/L - 80(H) 37    RADIOGRAPHIC STUDIES: I have personally reviewed the radiological images as listed and agreed with the findings in the report. Calvin Liver W Wo Contrast  Result Date: 05/31/2018 CLINICAL DATA:  Liver lesion on CT EXAM: MRI ABDOMEN WITHOUT AND WITH CONTRAST TECHNIQUE: Multiplanar multisequence Calvin imaging of the abdomen was performed both before and after the administration of intravenous contrast. CONTRAST:  5 mL Gadovist IV COMPARISON:  CT abdomen dated 05/24/2018. Right upper quadrant ultrasound dated 10/14/2017. Abdominal ultrasound dated 10/05/2014. FINDINGS: Motion degraded images. Lower chest: Lung bases are clear. Hepatobiliary: No definite morphologic findings of cirrhosis. Mild hepatic steatosis. 7.4 x 6.8 x 7.1 cm well-circumscribed enhancing mass along the inferior aspect of segment 6 (series 6/image 18). While the enhancement characteristics are poorly evaluated due to motion degradation, an enhancing rim is suspected (series 17/image 41). By definition, this is a LI-RADS 5 lesion given the history of hepatitis C. Suspected signal loss within the lesion on opposed phase imaging (series 7/image 35), which supports the diagnosis of well-differentiated HCC. Lesion was not present on 2016 ultrasound, which essentially excludes benign etiologies. Gallbladder is unremarkable. No intrahepatic or extrahepatic ductal dilatation. Pancreas:  Within normal limits. Spleen:  Spleen is normal in size. Adrenals/Urinary Tract:  Adrenal glands within normal limits. Kidneys are within normal limits.  No hydronephrosis. Stomach/Bowel: Stomach is within  normal limits. Visualized bowel is unremarkable, noting extensive colonic diverticulosis. Vascular/Lymphatic:  No evidence of abdominal aortic aneurysm. No suspicious abdominal lymphadenopathy. Other:  No abdominal ascites. Musculoskeletal: No focal osseous lesions. IMPRESSION: Motion degraded images. 7.4 cm enhancing mass along the inferior aspect of  segment 6, considered compatible with [place] (LI-RADS 5). Electronically Signed   By: Julian Hy M.D.   On: 05/31/2018 14:55   Ct Liver Abd W/wo  Result Date: 05/25/2018 CLINICAL DATA:  Evaluate liver mass. History of alcohol abuse. 30 pound weight loss. EXAM: CT ABDOMEN WITHOUT AND WITH CONTRAST TECHNIQUE: Multidetector CT imaging of the abdomen was performed following the standard protocol before and following the bolus administration of intravenous contrast. CONTRAST:  158mL OMNIPAQUE IOHEXOL 300 MG/ML  SOLN COMPARISON:  CT chest 01/14/2018 and abdominal sonogram 10/14/2017. FINDINGS: Lower chest: Pulmonary nodule in the right middle lobe measures 6 mm, image 2/5. New from 01/14/2018. Unchanged perifissural nodule in the anterior right lower lobe measuring 3 mm. Hepatobiliary: Diffuse hepatic steatosis. Mild hypertrophy of the lateral segment of left lobe of liver noted. A large hypervascular mass is identified within segment measuring 6.7 x 6.6 by 7.2 cm, on previous ultrasound this measured 6.2 x 6.1 x 6.2 cm. On the portal venous phase images the a pseudo capsule is identified. Washout of contrast is identified on the delayed images. Gallbladder negative.  No biliary dilatation. Pancreas: Unremarkable. No pancreatic ductal dilatation or surrounding inflammatory changes. Spleen: Normal in size without focal abnormality. Adrenals/Urinary Tract: Normal adrenal glands. Kidneys are unremarkable. No mass or hydronephrosis. Stomach/Bowel: Stomach is within normal limits. No evidence of bowel wall thickening, distention, or inflammatory changes.  Vascular/Lymphatic: Aortic atherosclerosis. No aneurysm. Portal vein and hepatic veins appear patent. No adenopathy Other: No abdominal wall hernia or abnormality. Musculoskeletal: No acute or significant osseous findings. IMPRESSION: 1. There is a large hypervascular mass within segment 5 of the liver. In a patient at high risk for [facility] this is concerning for hepatocellular carcinoma. 2. Morphologic features of the liver suggestive of early cirrhosis. 3. Pulmonary nodule in the right middle lobe is new from 01/14/2018 measuring 6 mm. Non-contrast chest CT at 6-12 months is recommended. If the nodule is stable at time of repeat CT, then future CT at 18-24 months (from today's scan) is considered optional for low-risk patients, but is recommended for high-risk patients. This recommendation follows the consensus statement: Guidelines for Management of Incidental Pulmonary Nodules Detected on CT Images: From the Fleischner Society 2017; Radiology 2017; 284:228-243. 4.  Aortic Atherosclerosis (ICD10-I70.0). Electronically Signed   By: Kerby Moors M.D.   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 Liver mass, compatible with [facility]  We reviewed his medical record in detail with the patient including imaging and AFP. 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 Linus Salmons.  His Child-Pugh score is 5 (class A). He has a 7.4 cm liver mass with typical image findings consistent with HCC, and slightly elevated AFP 8.9, his diagnosis of [place] is quite certain. His case was discussed in GI tumor board, don't think he needs needle biopsied  to confirm the diagnosis. CT chest in 01/2018 for lung cancer screening showed tiny 6 mm nodule; imaging otherwise negative for distant metastasis.   We did discuss the treatment options of liver transplant, surgical resection, and liver targeted therapy  for his [place]. He is not a transplant candidate due to the large size. He is still actively drinking alcohol, and has limited social support. He was encouraged to quit smoking and drinking immediately. He agrees to try.  He will meet with Dr. Barry Dienes  for surgical consult.   He would be a good candidate for liver targeted therapy if Dr. Barry Dienes recommends this prior to surgery. She will refer to IR if needed.   We discussed the systemic chemotherapy is not needed at this time, but if he develops metastatic disease in the future this can be considered. In general, [place] is not very sensitive to chemotherapy.   Dr. Burr Medico discussed that with surgery the goal of treatment is curative; if for reasons he is not a surgical candidate, other therapies will be palliative to control his disease, but may not cure his cancer. He understands.   We will discuss with Dr. Barry Dienes and plan to f/u in 2 months. If he pursues surgery and/or targeted therapy, f/u with be primarily with Dr. Barry Dienes and IR, respectively. We will still see him routinely. He will keep f/u with GI Dr. Tarri Glenn who will perform EGD on 11/11, and PCP routinely.   PLAN: -Patient to see Dr. Barry Dienes for surgical consult -If needed, she will refer to IR  -Lab and f/u with Dr. Burr Medico in 2 months  -EGD per Dr. Tarri Glenn on 11/11  Orders Placed This Encounter  Procedures  . CBC with Differential ([place] Only)    Standing Status:   Standing    Number of Occurrences:   10    Standing Expiration Date:   06/10/2019  . CMP ([place] only)    Standing Status:   Standing    Number of Occurrences:   10    Standing Expiration Date:   06/10/2019  . AFP tumor marker    Standing Status:   Standing    Number of Occurrences:   10    Standing Expiration Date:   06/10/2019    All questions were answered. The patient knows to call the clinic with any problems, questions or concerns.     Alla Feeling, NP 06/09/2018   Addendum  I have seen the patient,  examined him. I agree with the assessment and and plan and have edited the notes.   Calvin Gardner is a 67 yo male with PMH of alcohol and hepatitis C related liver cirrhosis, was found to have a large 7.4 x 6.8 x 7.1 cm tumor in the segment of 6 of liver.  The image finding is diagnostic for [place], his AFP was slightly elevated. He has good liver function, no cirrhosis related complications.  we have discussed his case in GI tumor board yesterday, Dr. Barry Dienes feels his tumor is probably resectable, but may need embolization to shrink it first.  He has been referred to see Dr. Barry Dienes for further discussion of surgery.  Is not a candidate for liver transplant, due to the size of tumor.  If surgery is not feasible, he can certainly be treated with chemoembolization or radioembolization.  We discussed the risk of recurrence after treatment, and the role of systemic chemotherapy for recurrent or metastatic disease. Will f/u him in the future.   Truitt Merle  06/11/2018

## 2018-06-13 ENCOUNTER — Encounter: Payer: Self-pay | Admitting: Gastroenterology

## 2018-06-13 ENCOUNTER — Ambulatory Visit (AMBULATORY_SURGERY_CENTER): Payer: Medicare Other | Admitting: Gastroenterology

## 2018-06-13 ENCOUNTER — Other Ambulatory Visit: Payer: Self-pay

## 2018-06-13 VITALS — BP 122/79 | HR 84 | Temp 97.8°F | Resp 21 | Ht 64.0 in | Wt 119.0 lb

## 2018-06-13 DIAGNOSIS — C229 Malignant neoplasm of liver, not specified as primary or secondary: Secondary | ICD-10-CM | POA: Diagnosis not present

## 2018-06-13 DIAGNOSIS — K7689 Other specified diseases of liver: Secondary | ICD-10-CM | POA: Diagnosis not present

## 2018-06-13 DIAGNOSIS — C220 Liver cell carcinoma: Secondary | ICD-10-CM | POA: Diagnosis not present

## 2018-06-13 DIAGNOSIS — B182 Chronic viral hepatitis C: Secondary | ICD-10-CM

## 2018-06-13 DIAGNOSIS — B199 Unspecified viral hepatitis without hepatic coma: Secondary | ICD-10-CM | POA: Diagnosis not present

## 2018-06-13 MED ORDER — SODIUM CHLORIDE 0.9 % IV SOLN: 500.0000 mL | Freq: Once | INTRAVENOUS | Status: DC

## 2018-06-13 NOTE — Op Note (Signed)
[place] Patient Name: Calvin Gardner Procedure Date: 06/13/2018 9:56 AM MRN: [id number] Endoscopist: Thornton Park MD, MD Age: 67 Referring MD:  Date of Birth: September 13, 1950 Gender: Male Account #: [id number] Procedure:                Upper GI endoscopy Indications:              Hepatitis rule out esophageal varices. Possible                            advanced fibrosis or early cirrhosis. New diagnosis                            of hepatocellular carcinoma undergoing EGD to                            screen for varices to determine if he is a                            resection candidate. Medicines:                See the Anesthesia note for documentation of the                            administered medications Procedure:                Pre-Anesthesia Assessment:                           - Prior to the procedure, a History and Physical                            was performed, and patient medications and                            allergies were reviewed. The patient's tolerance of                            previous anesthesia was also reviewed. The risks                            and benefits of the procedure and the sedation                            options and risks were discussed with the patient.                            All questions were answered, and informed consent                            was obtained. Prior Anticoagulants: The patient has                            taken no previous anticoagulant or antiplatelet  agents. ASA Grade Assessment: III - A patient with                            severe systemic disease. After reviewing the risks                            and benefits, the patient was deemed in                            satisfactory condition to undergo the procedure.                           After obtaining informed consent, the endoscope was                            passed under direct vision.  Throughout the                            procedure, the patient's blood pressure, pulse, and                            oxygen saturations were monitored continuously. The                            Endoscope was introduced through the mouth, and                            advanced to the second part of duodenum. The upper                            GI endoscopy was accomplished without difficulty.                            The patient tolerated the procedure well. Scope In: Scope Out: Findings:                 The esophagus was normal. There are no esophageal                            varices.                           The stomach was normal. There is no portal                            hypertensive gastropathy. There are no gastric                            varices.                           The examined duodenum was normal. Complications:            No immediate complications. Estimated Blood Loss:     Estimated blood loss: none. Impression:               -  Normal esophagus.                           - Normal stomach.                           - Normal examined duodenum.                           -No esophageal or gastric varices. No portal                            hypertensive gastropathy.                           - No specimens collected. Recommendation:           - Discharge patient to home.                           - Resume previous diet today.                           - Continue present medications.                           -We will send these results to the [facility] and to Dr. Barry Dienes. Thornton Park MD, MD 06/13/2018 10:08:56 AM This report has been signed electronically.

## 2018-06-13 NOTE — Progress Notes (Signed)
Patient to ride the bus home with his care partner.   Supervisor okayed it, and recovery has been notified.

## 2018-06-13 NOTE — Patient Instructions (Signed)
YOU HAD AN ENDOSCOPIC PROCEDURE TODAY AT THE [facility]:   Refer to the procedure report that was given to you for any specific questions about what was found during the examination.  If the procedure report does not answer your questions, please call your gastroenterologist to clarify.  If you requested that your care partner not be given the details of your procedure findings, then the procedure report has been included in a sealed envelope for you to review at your convenience later.  YOU SHOULD EXPECT: Some feelings of bloating in the abdomen. Passage of more gas than usual.  Walking can help get rid of the air that was put into your GI tract during the procedure and reduce the bloating. If you had a lower endoscopy (such as a colonoscopy or flexible sigmoidoscopy) you may notice spotting of blood in your stool or on the toilet paper. If you underwent a bowel prep for your procedure, you may not have a normal bowel movement for a few days.  Please Note:  You might notice some irritation and congestion in your nose or some drainage.  This is from the oxygen used during your procedure.  There is no need for concern and it should clear up in a day or so.  SYMPTOMS TO REPORT IMMEDIATELY:   Following upper endoscopy (EGD)  Vomiting of blood or coffee ground material  New chest pain or pain under the shoulder blades  Painful or persistently difficult swallowing  New shortness of breath  Fever of 100F or higher  Black, tarry-looking stools  For urgent or emergent issues, a gastroenterologist can be reached at any hour by calling [phone].   DIET:  We do recommend a small meal at first, but then you may proceed to your regular diet.  Drink plenty of fluids but you should avoid alcoholic beverages for 24 hours.  ACTIVITY:  You should plan to take it easy for the rest of today and you should NOT DRIVE or use heavy machinery until tomorrow (because of the sedation medicines used  during the test).    FOLLOW UP: Our staff will call the number listed on your records the next business day following your procedure to check on you and address any questions or concerns that you may have regarding the information given to you following your procedure. If we do not reach you, we will leave a message.  However, if you are feeling well and you are not experiencing any problems, there is no need to return our call.  We will assume that you have returned to your regular daily activities without incident.  If any biopsies were taken you will be contacted by phone or by letter within the next 1-3 weeks.  Please call us at [phone] if you have not heard about the biopsies in 3 weeks.    SIGNATURES/CONFIDENTIALITY: You and/or your care partner have signed paperwork which will be entered into your electronic medical record.  These signatures attest to the fact that that the information above on your After Visit Summary has been reviewed and is understood.  Full responsibility of the confidentiality of this discharge information lies with you and/or your care-partner. 

## 2018-06-14 ENCOUNTER — Telehealth: Payer: Self-pay | Admitting: *Deleted

## 2018-06-14 NOTE — Telephone Encounter (Signed)
  Follow up Call-  Call back number 06/13/2018  Post procedure Call Back phone  # [phone]  Permission to leave phone message Yes  Some recent data might be hidden     Patient questions:  Do you have a fever, pain , or abdominal swelling? No. Pain Score  0 *  Have you tolerated food without any problems? Yes.    Have you been able to return to your normal activities? Yes.    Do you have any questions about your discharge instructions: Diet   No. Medications  No. Follow up visit  No.  Do you have questions or concerns about your Care? No.  Actions: * If pain score is 4 or above: No action needed, pain <4.

## 2018-06-15 ENCOUNTER — Other Ambulatory Visit: Payer: Self-pay

## 2018-06-15 NOTE — Progress Notes (Signed)
06/08/18 Tumor Board. Plan: Referral to Dr. Barry Dienes for surgical evaluation.

## 2018-06-16 NOTE — Progress Notes (Signed)
Checked in [place] at [facility] and patient's consult with Dr. Barry Dienes has been moved from 06/14/18 to 06/20/18.

## 2018-06-27 ENCOUNTER — Other Ambulatory Visit: Payer: Self-pay | Admitting: General Surgery

## 2018-06-27 DIAGNOSIS — C220 Liver cell carcinoma: Secondary | ICD-10-CM

## 2018-06-27 MED FILL — DICLOFENAC SODIUM 1% GEL: 1 | 12 days supply | Qty: 100 | Fill #3

## 2018-06-27 MED FILL — TAMSULOSIN HCL 0.4 MG CAP: 0.4 | 30 days supply | Qty: 30 | Fill #4

## 2018-06-27 MED FILL — AMLODIPINE BESYLATE 5 MG TA: 5 | 30 days supply | Qty: 30 | Fill #0

## 2018-07-07 ENCOUNTER — Other Ambulatory Visit: Payer: Self-pay | Admitting: Interventional Radiology

## 2018-07-07 ENCOUNTER — Ambulatory Visit
Admission: RE | Admit: 2018-07-07 | Discharge: 2018-07-07 | Disposition: A | Discharge status: Home / Self Care | Payer: Medicare Other | Source: Ambulatory Visit | Attending: General Surgery | Admitting: General Surgery

## 2018-07-07 DIAGNOSIS — C220 Liver cell carcinoma: Secondary | ICD-10-CM

## 2018-07-07 DIAGNOSIS — K7030 Alcoholic cirrhosis of liver without ascites: Secondary | ICD-10-CM | POA: Diagnosis not present

## 2018-07-07 HISTORY — DX: Malignant (primary) neoplasm, unspecified: C80.1

## 2018-07-07 HISTORY — PX: IR RADIOLOGIST EVAL & MGMT: IMG5224

## 2018-07-07 NOTE — Consult Note (Signed)
Chief Complaint: Patient was seen in consultation today for hepatocellular cancer at the request of [place]  Referring Physician(s): Byerly,Faera  History of Present Illness: Calvin Gardner is a 67 y.o. male with a history of HCV and EtOH cirrhosis complicated by development of a large 7.4 cm partially exophytic mass arising from the inferior aspect of hepatic segment 6.  MR imaging demonstrates Li-RADS 5 imaging features diagnostic of hepatocellular carcinoma.  With his lesion being larger than 5 cm, he is outside of the [place] criteria and not a candidate for transplant.  Additionally, he continues to drink actively.  He drinks 3 to 4 40 ounce malt liquor beverages every day and has done so for the past 50 years.  He was evaluated by Dr. Barry Dienes who feels he is not an optimal surgical candidate.  Therefore, he presents at her kind request to discuss liver directed therapy.  Otherwise, he is nearly asymptomatic.  He has had approximately 30 pound weight loss over the past year.  He denies abdominal pain, nausea, vomiting, hematemesis or hematochezia.   Past Medical History:  Diagnosis Date  . Alcoholism ([place])   . Aortic atherosclerosis ([place])   . Arthritis   . Cancer ([place])    liver  . Cataract   . Hepatitis C   . HLD (hyperlipidemia)   . Hypertension   . Liver mass   . Pulmonary nodule     Past Surgical History:  Procedure Laterality Date  . COLONOSCOPY WITH PROPOFOL N/A 01/30/2014   Procedure: COLONOSCOPY WITH PROPOFOL;  Surgeon: Garlan Fair, MD;  Location: [facility];  Service: Endoscopy;  Laterality: N/A;  . ESOPHAGOGASTRODUODENOSCOPY (EGD) WITH PROPOFOL N/A 01/30/2014   Procedure: ESOPHAGOGASTRODUODENOSCOPY (EGD) WITH PROPOFOL;  Surgeon: Garlan Fair, MD;  Location: [facility];  Service: Endoscopy;  Laterality: N/A;  . IR RADIOLOGIST EVAL & MGMT  07/07/2018  . left jaw surgery     . MANDIBLE FRACTURE SURGERY      Allergies: Patient has no known  allergies.  Medications: Prior to Admission medications   Medication Sig Start Date End Date Taking? Authorizing Provider  amLODipine (NORVASC) 5 MG tablet Take 1 tablet (5 mg total) by mouth daily. 05/16/18  Yes Charlott Rakes, MD  aspirin EC 81 MG tablet Take 81 mg by mouth every morning. Reported on 12/19/2015   Yes [provider]  omeprazole (PRILOSEC) 40 MG capsule Take 1 capsule (40 mg total) by mouth daily. 05/31/18  Yes Thornton [place], MD  tamsulosin (FLOMAX) 0.4 MG CAPS capsule Take 1 capsule (0.4 mg total) by mouth daily. 01/10/18  Yes Charlott Rakes, MD     Family History  Problem Relation Age of Onset  . Cancer Mother        type unknown  . Emphysema Brother   . Colon cancer Neg Hx   . Esophageal cancer Neg Hx   . Pancreatic cancer Neg Hx     Social History   Socioeconomic History  . Marital status: Single    Spouse name: Not on file  . Number of children: 1  . Years of education: Not on file  . Highest education level: Not on file  Occupational History  . Occupation: works in Public relations account executive  . Financial resource strain: Not on file  . Food insecurity:    Worry: Not on file    Inability: Not on file  . Transportation needs:    Medical: Not on file    Non-medical: Not  on file  Tobacco Use  . Smoking status: Current Some Day Smoker    Packs/day: 1.00    Years: 50.00    Pack years: 50.00    Types: Cigarettes  . Smokeless tobacco: Never Used  . Tobacco comment: trying to quit, smoke every other day  Substance and Sexual Activity  . Alcohol use: Yes    Alcohol/week: 0.0 standard drinks    Comment: 2-3 40 oz beers per day, x50 years  . Drug use: No    Comment: remote IV drug use age 29   . Sexual activity: Yes    Partners: Female, Male    Comment: patient declines  Lifestyle  . Physical activity:    Days per week: Not on file    Minutes per session: Not on file  . Stress: Not on file  Relationships  . Social connections:     Talks on phone: Not on file    Gets together: Not on file    Attends religious service: Not on file    Active member of club or organization: Not on file    Attends meetings of clubs or organizations: Not on file    Relationship status: Not on file  Other Topics Concern  . Not on file  Social History Narrative  . Not on file    ECOG Status: 0 - Asymptomatic  Review of Systems: A 12 point ROS discussed and pertinent positives are indicated in the HPI above.  All other systems are negative.  Review of Systems  Vital Signs: BP 125/82   Pulse 90   Temp 98.5 F (36.9 C)   SpO2 99%   Physical Exam  Constitutional: He is oriented to person, place, and time. He appears well-developed and well-nourished. No distress.  HENT:  Head: Normocephalic and atraumatic.  Eyes: No scleral icterus.  Cardiovascular: Normal rate and regular rhythm.  Pulmonary/Chest: Effort normal.  Abdominal: Soft. He exhibits no distension. There is no tenderness.  Neurological: He is alert and oriented to person, place, and time.  Skin: Skin is warm and dry.  Psychiatric: He has a normal mood and affect. His behavior is normal.  Nursing note and vitals reviewed.    Imaging: Ir Radiologist Eval & Mgmt  Result Date: 07/07/2018 Please refer to notes tab for details about interventional procedure. (Op Note)   Labs:  CBC: Recent Labs    05/16/18 1219  WBC 10.7  HGB 13.0  HCT 39.1  PLT 199    COAGS: Recent Labs    05/31/18 0941  INR 1.0    BMP: Recent Labs    10/07/17 0954 05/16/18 1219 05/31/18 0941 05/31/18 1434  NA 137 136  --  135  K 4.3 4.9  --   --   CL 98 94*  --   --   CO2 23 16*  --   --   GLUCOSE 106* 58*  --   --   BUN 6* 10  --   --   CALCIUM 10.4* 10.1  --   --   CREATININE 0.74* 0.87 0.73  --   GFRNONAA 97 91  --   --   GFRAA 113 105  --   --     LIVER FUNCTION TESTS: Recent Labs    10/07/17 0954 05/16/18 1219 05/31/18 0941  BILITOT 0.9 0.8 0.7  AST 93*  185*  --   ALT 37 80*  --   ALKPHOS 59 66  --   PROT 7.5 7.5  --  ALBUMIN 4.8 4.9*  --     TUMOR MARKERS: No results for input(s): AFPTM, CEA, CA199, CHROMGRNA in the last 8760 hours.  Assessment and Plan:  67 year old male with a history of HCV and alcoholic cirrhosis now complicated by development of a 7.4 cm mass in hepatic segment 6 with imaging features diagnostic of hepatocellular carcinoma.  The lesion appears to demonstrate pseudo-encapsulation and an excellent arterial supply from the segment 6 hepatic artery.  He is an excellent candidate for liver directed therapy with transarterial chemoembolization utilizing drug-eluting beads.  If we can achieve significant treatment response and decrease the tumor size, he may become a candidate for microwave ablation in the future.  We also discussed the importance of alcohol abstinence at length.  He understands the importance of this and will decrease his intake.   1.) Schedule for DEB-TACE via radial artery approach.  2.) Abstain from alcohol  Thank you for this interesting consult.  I greatly enjoyed meeting DELL BRINER and look forward to participating in their care.  A copy of this report was sent to the requesting provider on this date.  Electronically Signed: Jacqulynn Cadet 07/07/2018, 11:36 AM   I spent a total of  40 Minutes in face to face in clinical consultation, greater than 50% of which was counseling/coordinating care for hepatocellular carcinoma.

## 2018-07-15 MED ORDER — LC BEADS 100-300UM IN SALINE
150.0000 mg | Freq: Once | Status: DC
  Filled 2018-07-15 (×2): qty 75

## 2018-07-18 ENCOUNTER — Other Ambulatory Visit: Payer: Self-pay | Admitting: Physician Assistant

## 2018-07-19 ENCOUNTER — Other Ambulatory Visit: Payer: Self-pay | Admitting: Interventional Radiology

## 2018-07-19 ENCOUNTER — Ambulatory Visit (HOSPITAL_COMMUNITY)
Admission: RE | Admit: 2018-07-19 | Discharge: 2018-07-19 | Disposition: A | Discharge status: Home / Self Care | Payer: Medicare Other | Source: Ambulatory Visit | Attending: Interventional Radiology | Admitting: Interventional Radiology

## 2018-07-19 ENCOUNTER — Other Ambulatory Visit: Payer: Self-pay

## 2018-07-19 ENCOUNTER — Observation Stay (HOSPITAL_COMMUNITY)
Admission: RE | Admit: 2018-07-19 | Discharge: 2018-07-21 | Disposition: A | Discharge status: Home / Self Care | Payer: Medicare Other | Source: Ambulatory Visit | Attending: Radiology | Admitting: Radiology

## 2018-07-19 ENCOUNTER — Encounter (HOSPITAL_COMMUNITY): Payer: Self-pay

## 2018-07-19 VITALS — BP 123/87 | HR 90 | Temp 102.0°F | Resp 20 | Ht 64.0 in | Wt 121.3 lb

## 2018-07-19 DIAGNOSIS — C220 Liver cell carcinoma: Principal | ICD-10-CM | POA: Diagnosis present

## 2018-07-19 DIAGNOSIS — Z7982 Long term (current) use of aspirin: Secondary | ICD-10-CM | POA: Diagnosis not present

## 2018-07-19 DIAGNOSIS — B1920 Unspecified viral hepatitis C without hepatic coma: Secondary | ICD-10-CM | POA: Diagnosis not present

## 2018-07-19 DIAGNOSIS — K7030 Alcoholic cirrhosis of liver without ascites: Secondary | ICD-10-CM | POA: Diagnosis not present

## 2018-07-19 DIAGNOSIS — M1990 Unspecified osteoarthritis, unspecified site: Secondary | ICD-10-CM | POA: Diagnosis not present

## 2018-07-19 DIAGNOSIS — I10 Essential (primary) hypertension: Secondary | ICD-10-CM | POA: Diagnosis not present

## 2018-07-19 DIAGNOSIS — E785 Hyperlipidemia, unspecified: Secondary | ICD-10-CM | POA: Diagnosis not present

## 2018-07-19 HISTORY — PX: IR EMBO TUMOR ORGAN ISCHEMIA INFARCT INC GUIDE ROADMAPPING: IMG5449

## 2018-07-19 HISTORY — PX: IR ANGIOGRAM SELECTIVE EACH ADDITIONAL VESSEL: IMG667

## 2018-07-19 HISTORY — PX: IR US GUIDE VASC ACCESS LEFT: IMG2389

## 2018-07-19 HISTORY — PX: IR ANGIOGRAM VISCERAL SELECTIVE: IMG657

## 2018-07-19 LAB — PROTIME-INR
INR: 0.97
Prothrombin Time: 12.8 seconds (ref 11.4–15.2)

## 2018-07-19 LAB — CBC WITH DIFFERENTIAL/PLATELET
Abs Immature Granulocytes: 0.03 10*3/uL (ref 0.00–0.07)
Basophils Absolute: 0 10*3/uL (ref 0.0–0.1)
Basophils Relative: 0 %
Eosinophils Absolute: 0.1 10*3/uL (ref 0.0–0.5)
Eosinophils Relative: 2 %
HCT: 40.1 % (ref 39.0–52.0)
Hemoglobin: 12.6 g/dL — ABNORMAL LOW (ref 13.0–17.0)
Immature Granulocytes: 0 %
Lymphocytes Relative: 17 %
Lymphs Abs: 1.1 10*3/uL (ref 0.7–4.0)
MCH: 29.5 pg (ref 26.0–34.0)
MCHC: 31.4 g/dL (ref 30.0–36.0)
MCV: 93.9 fL (ref 80.0–100.0)
Monocytes Absolute: 0.9 10*3/uL (ref 0.1–1.0)
Monocytes Relative: 13 %
Neutro Abs: 4.5 10*3/uL (ref 1.7–7.7)
Neutrophils Relative %: 68 %
Platelets: 222 10*3/uL (ref 150–400)
RBC: 4.27 MIL/uL (ref 4.22–5.81)
RDW: 12.6 % (ref 11.5–15.5)
WBC: 6.7 10*3/uL (ref 4.0–10.5)
nRBC: 0 % (ref 0.0–0.2)

## 2018-07-19 LAB — COMPREHENSIVE METABOLIC PANEL
ALK PHOS: 40 U/L (ref 38–126)
ALT: 23 U/L (ref 0–44)
AST: 35 U/L (ref 15–41)
Albumin: 4.5 g/dL (ref 3.5–5.0)
Anion gap: 9 (ref 5–15)
BUN: 8 mg/dL (ref 8–23)
CALCIUM: 9.9 mg/dL (ref 8.9–10.3)
CO2: 24 mmol/L (ref 22–32)
Chloride: 102 mmol/L (ref 98–111)
Creatinine, Ser: 0.77 mg/dL (ref 0.61–1.24)
GFR calc Af Amer: >60 mL/min (ref >60)
GFR calc non Af Amer: >60 mL/min (ref >60)
Glucose, Bld: 110 mg/dL — ABNORMAL HIGH (ref 70–99)
Potassium: 4 mmol/L (ref 3.5–5.1)
Sodium: 135 mmol/L (ref 135–145)
Total Bilirubin: 0.8 mg/dL (ref 0.3–1.2)
Total Protein: 7.7 g/dL (ref 6.5–8.1)

## 2018-07-19 MED ORDER — PANTOPRAZOLE SODIUM 40 MG PO TBEC
40.0000 mg | DELAYED_RELEASE_TABLET | Freq: Every day | ORAL | Status: DC
  Administered 2018-07-20 – 2018-07-21 (×2): 40 mg via ORAL
  Filled 2018-07-19 (×2): qty 1

## 2018-07-19 MED ORDER — VERAPAMIL HCL 2.5 MG/ML IV SOLN
INTRAVENOUS | Status: AC
  Filled 2018-07-19: qty 2

## 2018-07-19 MED ORDER — MIDAZOLAM HCL 2 MG/2ML IJ SOLN
INTRAMUSCULAR | Status: AC | PRN
  Administered 2018-07-19 (×2): 1 mg via INTRAVENOUS

## 2018-07-19 MED ORDER — IOHEXOL 300 MG/ML  SOLN
100.0000 mL | Freq: Once | INTRAMUSCULAR | Status: AC | PRN
  Administered 2018-07-19: 50 mL via INTRAVENOUS

## 2018-07-19 MED ORDER — IOHEXOL 300 MG/ML  SOLN
100.0000 mL | Freq: Once | INTRAMUSCULAR | Status: AC | PRN
  Administered 2018-07-19: 50 mL via INTRA_ARTERIAL

## 2018-07-19 MED ORDER — DOCUSATE SODIUM 100 MG PO CAPS
100.0000 mg | ORAL_CAPSULE | Freq: Two times a day (BID) | ORAL | Status: DC
  Administered 2018-07-20 – 2018-07-21 (×3): 100 mg via ORAL
  Filled 2018-07-19 (×3): qty 1

## 2018-07-19 MED ORDER — LIDOCAINE HCL 1 % IJ SOLN
INTRAMUSCULAR | Status: AC
  Filled 2018-07-19: qty 20

## 2018-07-19 MED ORDER — SODIUM CHLORIDE 0.9 % IV SOLN: 250.0000 mL | INTRAVENOUS | Status: DC | PRN

## 2018-07-19 MED ORDER — PIPERACILLIN-TAZOBACTAM 3.375 G IVPB
3.3750 g | Freq: Three times a day (TID) | INTRAVENOUS | Status: DC
  Administered 2018-07-19 – 2018-07-21 (×5): 3.375 g via INTRAVENOUS
  Filled 2018-07-19 (×6): qty 50

## 2018-07-19 MED ORDER — VERAPAMIL HCL 2.5 MG/ML IV SOLN
INTRA_ARTERIAL | Status: AC | PRN
  Administered 2018-07-19: 13:00:00 via INTRA_ARTERIAL

## 2018-07-19 MED ORDER — MIDAZOLAM HCL 2 MG/2ML IJ SOLN
INTRAMUSCULAR | Status: AC
  Filled 2018-07-19: qty 6

## 2018-07-19 MED ORDER — SODIUM CHLORIDE 0.9 % IV SOLN
8.0000 mg | Freq: Once | INTRAVENOUS | Status: AC
  Administered 2018-07-19: 8 mg via INTRAVENOUS
  Filled 2018-07-19: qty 4

## 2018-07-19 MED ORDER — PROMETHAZINE HCL 25 MG PO TABS
25.0000 mg | ORAL_TABLET | Freq: Three times a day (TID) | ORAL | Status: DC | PRN
  Administered 2018-07-20: 25 mg via ORAL
  Filled 2018-07-19 (×2): qty 1

## 2018-07-19 MED ORDER — ONDANSETRON HCL 4 MG/2ML IJ SOLN: 4.0000 mg | Freq: Four times a day (QID) | INTRAMUSCULAR | Status: DC | PRN

## 2018-07-19 MED ORDER — TAMSULOSIN HCL 0.4 MG PO CAPS
0.4000 mg | ORAL_CAPSULE | Freq: Every day | ORAL | Status: DC
  Administered 2018-07-20 – 2018-07-21 (×2): 0.4 mg via ORAL
  Filled 2018-07-19 (×2): qty 1

## 2018-07-19 MED ORDER — SODIUM CHLORIDE 0.9% FLUSH: 3.0000 mL | Freq: Two times a day (BID) | INTRAVENOUS | Status: DC

## 2018-07-19 MED ORDER — FENTANYL CITRATE (PF) 100 MCG/2ML IJ SOLN
INTRAMUSCULAR | Status: AC
  Filled 2018-07-19: qty 4

## 2018-07-19 MED ORDER — SODIUM CHLORIDE 0.9 % IV SOLN
INTRAVENOUS | Status: AC
  Administered 2018-07-19: 20:00:00 via INTRAVENOUS

## 2018-07-19 MED ORDER — SODIUM CHLORIDE 0.9 % IV SOLN
INTRAVENOUS | Status: DC
  Administered 2018-07-19 – 2018-07-20 (×3): via INTRAVENOUS

## 2018-07-19 MED ORDER — AMLODIPINE BESYLATE 5 MG PO TABS
5.0000 mg | ORAL_TABLET | Freq: Every day | ORAL | Status: DC
  Administered 2018-07-20 – 2018-07-21 (×2): 5 mg via ORAL
  Filled 2018-07-19 (×2): qty 1

## 2018-07-19 MED ORDER — PIPERACILLIN-TAZOBACTAM 3.375 G IVPB
INTRAVENOUS | Status: AC
  Administered 2018-07-19: 3.375 g via INTRAVENOUS
  Filled 2018-07-19: qty 50

## 2018-07-19 MED ORDER — LIDOCAINE HCL (PF) 1 % IJ SOLN
INTRAMUSCULAR | Status: AC | PRN
  Administered 2018-07-19: 5 mL

## 2018-07-19 MED ORDER — DEXAMETHASONE SODIUM PHOSPHATE 10 MG/ML IJ SOLN
INTRAMUSCULAR | Status: AC
  Filled 2018-07-19: qty 1

## 2018-07-19 MED ORDER — OXYCODONE HCL 5 MG PO TABS
5.0000 mg | ORAL_TABLET | ORAL | Status: DC | PRN
  Administered 2018-07-19 – 2018-07-21 (×7): 5 mg via ORAL
  Filled 2018-07-19 (×7): qty 1

## 2018-07-19 MED ORDER — NITROGLYCERIN IN D5W 100-5 MCG/ML-% IV SOLN
INTRAVENOUS | Status: AC
  Filled 2018-07-19: qty 250

## 2018-07-19 MED ORDER — SODIUM CHLORIDE 0.9% FLUSH: 3.0000 mL | INTRAVENOUS | Status: DC | PRN

## 2018-07-19 MED ORDER — LC BEADS 100-300UM IN SALINE
150.0000 mg | Freq: Once | Status: AC
  Administered 2018-07-19: 150 mg via INTRA_ARTERIAL
  Filled 2018-07-19: qty 75

## 2018-07-19 MED ORDER — FENTANYL CITRATE (PF) 100 MCG/2ML IJ SOLN
INTRAMUSCULAR | Status: AC | PRN
  Administered 2018-07-19 (×2): 25 ug via INTRAVENOUS
  Administered 2018-07-19: 50 ug via INTRAVENOUS

## 2018-07-19 MED ORDER — HEPARIN SODIUM (PORCINE) 1000 UNIT/ML IJ SOLN
INTRAMUSCULAR | Status: AC
  Filled 2018-07-19: qty 1

## 2018-07-19 MED ORDER — VERAPAMIL HCL 2.5 MG/ML IV SOLN
INTRA_ARTERIAL | Status: AC | PRN
  Administered 2018-07-19: 12:00:00 via INTRA_ARTERIAL

## 2018-07-19 MED ORDER — DEXAMETHASONE SODIUM PHOSPHATE 10 MG/ML IJ SOLN
4.0000 mg | Freq: Once | INTRAMUSCULAR | Status: AC
  Administered 2018-07-19: 4 mg via INTRAVENOUS

## 2018-07-19 MED ORDER — PIPERACILLIN-TAZOBACTAM 3.375 G IVPB 30 MIN
3.3750 g | Freq: Once | INTRAVENOUS | Status: AC
  Administered 2018-07-19: 3.375 g via INTRAVENOUS
  Filled 2018-07-19: qty 50

## 2018-07-19 MED ORDER — PROMETHAZINE HCL 25 MG RE SUPP
25.0000 mg | Freq: Three times a day (TID) | RECTAL | Status: DC | PRN
  Filled 2018-07-19: qty 1

## 2018-07-19 NOTE — H&P (Addendum)
Referring Physician(s): Byerly,F  Supervising Physician: Jacqulynn Cadet  Patient Status:  [facility] TBA  Chief Complaint:  Hepatocellular carcinoma   Subjective: Patient familiar to IR service from recent consultation with Dr. Laurence Ferrari on 07/07/2018 to discuss treatment options for hepatocellular carcinoma.  He is a 67 y.o. male smoker with a history of HCV and EtOH cirrhosis complicated by development of a large 7.4 cm partially exophytic mass arising from the inferior aspect of hepatic segment 6.  MR imaging demonstrates Li-RADS 5 imaging features diagnostic of hepatocellular carcinoma.  With his lesion being larger than 5 cm, he is outside of the Milan criteria and not a candidate for transplant.  Additionally, he continues to drink actively.  He drinks 3 to 4 40 ounce malt liquor beverages every day and has done so for the past 50 years. He was evaluated by Dr. Barry Dienes who feels he is not an optimal surgical candidate but following discussions with Dr. Laurence Ferrari he was deemed an appropriate candidate for hepatic transarterial chemoembolization and presents today for the procedure.  He currently denies fever, headache, chest pain, dyspnea, cough, abdominal/back pain, nausea, vomiting or bleeding.  Past Medical History:  Diagnosis Date  . Alcoholism ([place])   . Aortic atherosclerosis ([place])   . Arthritis   . Cancer ([place])    liver  . Cataract   . Hepatitis C   . HLD (hyperlipidemia)   . Hypertension   . Liver mass   . Pulmonary nodule    Past Surgical History:  Procedure Laterality Date  . COLONOSCOPY WITH PROPOFOL N/A 01/30/2014   Procedure: COLONOSCOPY WITH PROPOFOL;  Surgeon: Garlan Fair, MD;  [facility];  Service: Endoscopy;  Laterality: N/A;  . ESOPHAGOGASTRODUODENOSCOPY (EGD) WITH PROPOFOL N/A 01/30/2014   Procedure: ESOPHAGOGASTRODUODENOSCOPY (EGD) WITH PROPOFOL;  Surgeon: Garlan Fair, MD;  [facility];  Service: Endoscopy;  Laterality: N/A;   . IR RADIOLOGIST EVAL & MGMT  07/07/2018  . left jaw surgery     . MANDIBLE FRACTURE SURGERY        Allergies: Patient has no known allergies.  Medications: Prior to Admission medications   Medication Sig Start Date End Date Taking? Authorizing Provider  amLODipine (NORVASC) 5 MG tablet Take 1 tablet (5 mg total) by mouth daily. 05/16/18   Charlott Rakes, MD  aspirin EC 81 MG tablet Take 81 mg by mouth every morning. Reported on 12/19/2015    [provider]  omeprazole (PRILOSEC) 40 MG capsule Take 1 capsule (40 mg total) by mouth daily. 05/31/18   Thorn[place], MD  tamsulosin (FLOMAX) 0.4 MG CAPS capsule Take 1 capsule (0.4 mg total) by mouth daily. 01/10/18   Charlott Rakes, MD     Vital Signs: BP 127/85 (BP Location: Right Arm)   Pulse 98   Temp 98.1 F (36.7 C) (Oral)   Resp 18   SpO2 100%   Physical Exam awake, alert.  Chest clear to auscultation bilaterally.  Heart with regular rate and rhythm.  Abdomen soft, positive bowel sounds, nontender.  No lower extremity edema.  2+ left radial pulse, ulnar 1+  Imaging: No results found.  Labs:  CBC: Recent Labs    05/16/18 1219  WBC 10.7  HGB 13.0  HCT 39.1  PLT 199    COAGS: Recent Labs    05/31/18 0941  INR 1.0    BMP: Recent Labs    10/07/17 0954 05/16/18 1219 05/31/18 0941 05/31/18 1434  NA 137 136  --  135  K 4.3 4.9  --   --   CL 98 94*  --   --   CO2 23 16*  --   --   GLUCOSE 106* 58*  --   --   BUN 6* 10  --   --   CALCIUM 10.4* 10.1  --   --   CREATININE 0.74* 0.87 0.73  --   GFRNONAA 97 91  --   --   GFRAA 113 105  --   --     LIVER FUNCTION TESTS: Recent Labs    10/07/17 0954 05/16/18 1219 05/31/18 0941  BILITOT 0.9 0.8 0.7  AST 93* 185*  --   ALT 37 80*  --   ALKPHOS 59 66  --   PROT 7.5 7.5  --   ALBUMIN 4.8 4.9*  --     Assessment and Plan: Patient with history of alcoholic cirrhosis, HCV, 7.4 cm segment 6 hepatocellular carcinoma; poor surgical  candidate.  Seen in consultation by Dr. Laurence Ferrari on 07/07/2018 and deemed an appropriate candidate for liver directed therapy with transarterial chemoembolization.  He presents today for the procedure.Risks and benefits of procedure were discussed with the patient including, but not limited to bleeding, infection, vascular injury or contrast induced renal failure.  This interventional procedure involves the use of X-rays and because of the nature of the planned procedure, it is possible that we will have prolonged use of X-ray fluoroscopy.  Potential radiation risks to you include (but are not limited to) the following: - A slightly elevated risk for cancer  several years later in life. This risk is typically less than 0.5% percent. This risk is low in comparison to the normal incidence of human cancer, which is 33% for women and 50% for men according to the [place]. - Radiation induced injury can include skin redness, resembling a rash, tissue breakdown / ulcers and hair loss (which can be temporary or permanent).   The likelihood of either of these occurring depends on the difficulty of the procedure and whether you are sensitive to radiation due to previous procedures, disease, or genetic conditions.   IF your procedure requires a prolonged use of radiation, you will be notified and given written instructions for further action.  It is your responsibility to monitor the irradiated area for the 2 weeks following the procedure and to notify your physician if you are concerned that you have suffered a radiation induced injury.    All of the patient's questions were answered, patient is agreeable to proceed.  Consent signed and in chart.  Postprocedure patient may be admitted for overnight observation depending upon clinical status at that time  LABS PENDING  Electronically Signed: D. Rowe Robert, PA-C 07/19/2018, 9:27 AM   I spent a total of 30 minutes at the the  patient's bedside AND on the patient's hospital floor or unit, greater than 50% of which was counseling/coordinating care for hepatic arteriogram with chemoembolization

## 2018-07-19 NOTE — Procedures (Signed)
Interventional Radiology Procedure Note  Procedure: Hepatic angiogram and DEB-TACE.   Vascular Access:  Left radial, 18F --> TR Band  Complications: None.  Estimated Blood Loss: None.  Recommendations: - Admit for obs - ADAT - Labs in am  Signed,  Criselda Peaches, MD

## 2018-07-19 NOTE — Progress Notes (Signed)
RN report called to [employer], Therapist, sports.  Patient transported to 1305 via wheelchair with no issues noted.

## 2018-07-19 NOTE — Sedation Documentation (Signed)
TR band placed by Dr Laurence Ferrari with 11cc of air

## 2018-07-20 ENCOUNTER — Other Ambulatory Visit: Payer: Self-pay

## 2018-07-20 DIAGNOSIS — B1920 Unspecified viral hepatitis C without hepatic coma: Secondary | ICD-10-CM | POA: Diagnosis not present

## 2018-07-20 DIAGNOSIS — I10 Essential (primary) hypertension: Secondary | ICD-10-CM | POA: Diagnosis not present

## 2018-07-20 DIAGNOSIS — M1990 Unspecified osteoarthritis, unspecified site: Secondary | ICD-10-CM | POA: Diagnosis not present

## 2018-07-20 DIAGNOSIS — C220 Liver cell carcinoma: Secondary | ICD-10-CM | POA: Diagnosis not present

## 2018-07-20 DIAGNOSIS — K7030 Alcoholic cirrhosis of liver without ascites: Secondary | ICD-10-CM | POA: Diagnosis not present

## 2018-07-20 DIAGNOSIS — E785 Hyperlipidemia, unspecified: Secondary | ICD-10-CM | POA: Diagnosis not present

## 2018-07-20 LAB — CBC
HCT: 38 % — ABNORMAL LOW (ref 39.0–52.0)
Hemoglobin: 12.2 g/dL — ABNORMAL LOW (ref 13.0–17.0)
MCH: 29.9 pg (ref 26.0–34.0)
MCHC: 32.1 g/dL (ref 30.0–36.0)
MCV: 93.1 fL (ref 80.0–100.0)
Platelets: 227 10*3/uL (ref 150–400)
RBC: 4.08 MIL/uL — ABNORMAL LOW (ref 4.22–5.81)
RDW: 12.2 % (ref 11.5–15.5)
WBC: 13 10*3/uL — ABNORMAL HIGH (ref 4.0–10.5)
nRBC: 0 % (ref 0.0–0.2)

## 2018-07-20 LAB — COMPREHENSIVE METABOLIC PANEL
ALT: 76 U/L — ABNORMAL HIGH (ref 0–44)
AST: 124 U/L — ABNORMAL HIGH (ref 15–41)
Albumin: 3.9 g/dL (ref 3.5–5.0)
Alkaline Phosphatase: 39 U/L (ref 38–126)
Anion gap: 11 (ref 5–15)
BUN: 6 mg/dL — ABNORMAL LOW (ref 8–23)
CO2: 19 mmol/L — ABNORMAL LOW (ref 22–32)
Calcium: 9.5 mg/dL (ref 8.9–10.3)
Chloride: 102 mmol/L (ref 98–111)
Creatinine, Ser: 0.69 mg/dL (ref 0.61–1.24)
Glucose, Bld: 114 mg/dL — ABNORMAL HIGH (ref 70–99)
Potassium: 4.2 mmol/L (ref 3.5–5.1)
Sodium: 132 mmol/L — ABNORMAL LOW (ref 135–145)
Total Bilirubin: 0.5 mg/dL (ref 0.3–1.2)
Total Protein: 7.3 g/dL (ref 6.5–8.1)

## 2018-07-20 NOTE — Progress Notes (Signed)
Referring Physician(s): Byerly,F  Supervising Physician: Marybelle Killings  Patient Status:  [facility] - In-pt  Chief Complaint:  Hepatocellular carcinoma  Subjective:  Pt doing ok; still has some RUQ discomfort; did have some nausea earlier today but now resolved; denies fever,HA,CP,worsening dyspnea or bleeding; no issues with left radial artery access site  Allergies: Patient has no known allergies.  Medications: Prior to Admission medications   Medication Sig Start Date End Date Taking? Authorizing Provider  amLODipine (NORVASC) 5 MG tablet Take 1 tablet (5 mg total) by mouth daily. 05/16/18  Yes Charlott Rakes, MD  aspirin EC 81 MG tablet Take 81 mg by mouth every morning. Reported on 12/19/2015   Yes [provider]  omeprazole (PRILOSEC) 40 MG capsule Take 1 capsule (40 mg total) by mouth daily. 05/31/18  Yes Thornton Park, MD  tamsulosin (FLOMAX) 0.4 MG CAPS capsule Take 1 capsule (0.4 mg total) by mouth daily. 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 He presents today for drug-eluting bead chemoembolization. EXAM: Drug-eluting bead chemoembolization 1. Ultrasound-guided access, left radial artery 2. Catheterization of the celiac artery with arteriogram 3. Catheterization of the common hepatic artery with arteriogram 4. Catheterization of the proper hepatic artery with arteriogram 5. Catheterization of the right hepatic artery with arteriogram 6. Catheterization of the  segment 6 branch of the hepatic artery with arteriogram 7. Drug-eluting bead chemoembolization 8. Catheterization of an accessory segment 6 branch of the hepatic artery with arteriogram 9. Drug-eluting bead chemoembolization MEDICATIONS: Zosyn 3.375 g. The antibiotic was administered within 1 hour of the procedure. Additionally, 4 mg Decadron was administered intravenously. ANESTHESIA/SEDATION: Moderate (conscious) sedation was employed during this procedure. A total of Versed 2 mg and Fentanyl 100 mcg was administered intravenously. Moderate Sedation Time: 85 minutes. The patient's level of consciousness and vital signs were monitored continuously by radiology nursing throughout the procedure under my direct supervision. CONTRAST:  100 mL Isovue 370 FLUOROSCOPY TIME:  Fluoroscopy Time: 27 minutes 54 seconds (1145 mGy). COMPLICATIONS: None immediate. PROCEDURE: Informed consent was obtained from the patient following explanation of the procedure, risks, benefits and alternatives. The patient understands, agrees and consents for the procedure. All questions were addressed. A time out was performed prior to the initiation of the procedure. Maximal barrier sterile technique utilized including caps, mask, sterile gowns, sterile gloves, large sterile drape, hand hygiene, and Betadine prep. The left radial artery was interrogated with ultrasound and found to be widely patent and of adequate diameter. An image was obtained and stored for the medical record. Local anesthesia was attained by infiltration with 1% lidocaine. Under real-time sonographic guidance, the vessel was punctured with a 21 gauge micropuncture needle. Using standard technique, the initial micro needle was exchanged over a 0.021 micro wire for a hydrophilic 5 French slim arterial sheath. The sheath was flushed and then a radial cocktail consisting of 5000 units heparin, 2.5 mg Verapamil and 200 mcg nitroglycerine  was injected through the sheath. An  ultimate 1 catheter was advanced over a Bentson wire into the abdominal aorta. The catheter was used to select the celiac axis. A celiac arteriogram was performed. The left gastric artery is hypertrophic. The common hepatic artery was successfully identified. The catheter was then navigated into the common hepatic artery. An arteriogram was performed. Conventional hepatic arterial anatomy. 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 There appear to be multiple feeding arteries. The catheter was next advanced into the proper hepatic artery and additional arteriography was performed in multiple obliquities. The mass appears to be fed from a combination of a segment 6 and accessory segment 6 hepatic artery. A 150 cm Renegade high flow microcatheter was then advanced coaxially through the 5 French catheter over a Fathom 14 wire. The microcatheter was advanced into the right hepatic artery. Right hepatic arteriography was performed in multiple obliquities identifying the origins of the segment 6 and accessory segment 6 hepatic arteries. The microcatheter was successfully navigated into the segment 6 hepatic artery. Arteriography was performed confirming significant hypervascularity and tumor blush. The dominant segment 6 hepatic artery supplies approximately 60-70% of the overall tumor volume. Chemoembolization was then performed. 1.2 vials of 100-300 micron LC beads loaded with doxorubicin were injected. Overall flow was diminished, but stasis was not achieved. The microcatheter was then flushed and brought back more proximally to access the accessory segment 6 hepatic artery. The accessory segment 6 hepatic artery was successfully catheterized. Contrast injection was performed confirming tumor blush in the other 30% of the tumor volume. Chemoembolization was performed using a 0.8 vials of 100-300 micron LC beads loaded with doxorubicin. The microcatheter was removed. A final contrast injection was  performed through the 5 French catheter demonstrating overall decreased tumor blush. The catheter was removed. An additional 2.5 mg verapamil and 200 mcg nitroglycerin were administered through the radial sheath. The radial sheath was then removed and hemostasis attained with the assistance of a TR band. The patient tolerated the procedure well. IMPRESSION: Technically successful drug-eluting bead chemoembolization. Electronically Signed   By: Jacqulynn Cadet M.D.   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 He presents today for drug-eluting bead chemoembolization. EXAM: Drug-eluting bead chemoembolization 1. Ultrasound-guided access, left radial artery 2. Catheterization of the celiac artery with arteriogram 3. Catheterization of the common hepatic artery with arteriogram 4. Catheterization of the proper hepatic artery with arteriogram 5. Catheterization of the right hepatic artery with arteriogram 6. Catheterization of the segment 6 branch of the hepatic artery with arteriogram 7. Drug-eluting bead chemoembolization 8. Catheterization of an accessory segment 6 branch of the hepatic artery with arteriogram 9. Drug-eluting bead chemoembolization MEDICATIONS: Zosyn 3.375 g. The antibiotic was administered within 1 hour of the procedure. Additionally, 4 mg Decadron was administered intravenously. ANESTHESIA/SEDATION: Moderate (conscious) sedation was employed during this procedure. A total of Versed 2 mg and Fentanyl 100 mcg was administered intravenously. Moderate Sedation Time: 85 minutes. The patient's level of consciousness and vital signs were monitored continuously by radiology nursing throughout the procedure under my direct supervision. CONTRAST:  100 mL Isovue 370 FLUOROSCOPY TIME:  Fluoroscopy Time: 27 minutes 54 seconds (1145 mGy). COMPLICATIONS:  None immediate. PROCEDURE: Informed consent was obtained from the patient following explanation of the procedure, risks, benefits and alternatives. The patient understands, agrees and consents for the procedure. All questions were addressed. A time  out was performed prior to the initiation of the procedure. Maximal barrier sterile technique utilized including caps, mask, sterile gowns, sterile gloves, large sterile drape, hand hygiene, and Betadine prep. The left radial artery was interrogated with ultrasound and found to be widely patent and of adequate diameter. An image was obtained and stored for the medical record. Local anesthesia was attained by infiltration with 1% lidocaine. Under real-time sonographic guidance, the vessel was punctured with a 21 gauge micropuncture needle. Using standard technique, the initial micro needle was exchanged over a 0.021 micro wire for a hydrophilic 5 French slim arterial sheath. The sheath was flushed and then a radial cocktail consisting of 5000 units heparin, 2.5 mg Verapamil and 200 mcg nitroglycerine was injected through the sheath. An ultimate 1 catheter was advanced over a Bentson wire into the abdominal aorta. The catheter was used to select the celiac axis. A celiac arteriogram was performed. The left gastric artery is hypertrophic. The common hepatic artery was successfully identified. The catheter was then navigated into the common hepatic artery. An arteriogram was performed. Conventional hepatic arterial anatomy. 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 There appear to be multiple feeding arteries. The catheter was next advanced into the proper hepatic artery and additional arteriography was performed in multiple obliquities. The mass appears to be fed from a combination of a segment 6 and accessory segment 6 hepatic artery. A 150 cm Renegade high flow microcatheter was then advanced coaxially through the 5 French catheter over a Fathom 14 wire.  The microcatheter was advanced into the right hepatic artery. Right hepatic arteriography was performed in multiple obliquities identifying the origins of the segment 6 and accessory segment 6 hepatic arteries. The microcatheter was successfully navigated into the segment 6 hepatic artery. Arteriography was performed confirming significant hypervascularity and tumor blush. The dominant segment 6 hepatic artery supplies approximately 60-70% of the overall tumor volume. Chemoembolization was then performed. 1.2 vials of 100-300 micron LC beads loaded with doxorubicin were injected. Overall flow was diminished, but stasis was not achieved. The microcatheter was then flushed and brought back more proximally to access the accessory segment 6 hepatic artery. The accessory segment 6 hepatic artery was successfully catheterized. Contrast injection was performed confirming tumor blush in the other 30% of the tumor volume. Chemoembolization was performed using a 0.8 vials of 100-300 micron LC beads loaded with doxorubicin. The microcatheter was removed. A final contrast injection was performed through the 5 French catheter demonstrating overall decreased tumor blush. The catheter was removed. An additional 2.5 mg verapamil and 200 mcg nitroglycerin were administered through the radial sheath. The radial sheath was then removed and hemostasis attained with the assistance of a TR band. The patient tolerated the procedure well. IMPRESSION: Technically successful drug-eluting bead chemoembolization. Electronically Signed   By: Jacqulynn Cadet M.D.   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 He presents today for drug-eluting bead chemoembolization. EXAM: Drug-eluting bead chemoembolization 1. Ultrasound-guided access, left radial artery 2. Catheterization of the celiac  artery with arteriogram 3. Catheterization of the common hepatic artery with arteriogram 4. Catheterization of the proper hepatic artery with arteriogram 5. Catheterization of the right hepatic artery with arteriogram 6. Catheterization of the segment 6 branch of the hepatic artery with arteriogram 7. Drug-eluting bead chemoembolization 8. Catheterization of an accessory segment 6 branch of the hepatic artery with arteriogram 9. Drug-eluting  bead chemoembolization MEDICATIONS: Zosyn 3.375 g. The antibiotic was administered within 1 hour of the procedure. Additionally, 4 mg Decadron was administered intravenously. ANESTHESIA/SEDATION: Moderate (conscious) sedation was employed during this procedure. A total of Versed 2 mg and Fentanyl 100 mcg was administered intravenously. Moderate Sedation Time: 85 minutes. The patient's level of consciousness and vital signs were monitored continuously by radiology nursing throughout the procedure under my direct supervision. CONTRAST:  100 mL Isovue 370 FLUOROSCOPY TIME:  Fluoroscopy Time: 27 minutes 54 seconds (1145 mGy). COMPLICATIONS: None immediate. PROCEDURE: Informed consent was obtained from the patient following explanation of the procedure, risks, benefits and alternatives. The patient understands, agrees and consents for the procedure. All questions were addressed. A time out was performed prior to the initiation of the procedure. Maximal barrier sterile technique utilized including caps, mask, sterile gowns, sterile gloves, large sterile drape, hand hygiene, and Betadine prep. The left radial artery was interrogated with ultrasound and found to be widely patent and of adequate diameter. An image was obtained and stored for the medical record. Local anesthesia was attained by infiltration with 1% lidocaine. Under real-time sonographic guidance, the vessel was punctured with a 21 gauge micropuncture needle. Using standard technique, the initial micro needle was exchanged  over a 0.021 micro wire for a hydrophilic 5 French slim arterial sheath. The sheath was flushed and then a radial cocktail consisting of 5000 units heparin, 2.5 mg Verapamil and 200 mcg nitroglycerine was injected through the sheath. An ultimate 1 catheter was advanced over a Bentson wire into the abdominal aorta. The catheter was used to select the celiac axis. A celiac arteriogram was performed. The left gastric artery is hypertrophic. The common hepatic artery was successfully identified. The catheter was then navigated into the common hepatic artery. An arteriogram was performed. Conventional hepatic arterial anatomy. 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 There appear to be multiple feeding arteries. The catheter was next advanced into the proper hepatic artery and additional arteriography was performed in multiple obliquities. The mass appears to be fed from a combination of a segment 6 and accessory segment 6 hepatic artery. A 150 cm Renegade high flow microcatheter was then advanced coaxially through the 5 French catheter over a Fathom 14 wire. The microcatheter was advanced into the right hepatic artery. Right hepatic arteriography was performed in multiple obliquities identifying the origins of the segment 6 and accessory segment 6 hepatic arteries. The microcatheter was successfully navigated into the segment 6 hepatic artery. Arteriography was performed confirming significant hypervascularity and tumor blush. The dominant segment 6 hepatic artery supplies approximately 60-70% of the overall tumor volume. Chemoembolization was then performed. 1.2 vials of 100-300 micron LC beads loaded with doxorubicin were injected. Overall flow was diminished, but stasis was not achieved. The microcatheter was then flushed and brought back more proximally to access the accessory segment 6 hepatic artery. The accessory segment 6 hepatic artery was successfully catheterized. Contrast injection was  performed confirming tumor blush in the other 30% of the tumor volume. Chemoembolization was performed using a 0.8 vials of 100-300 micron LC beads loaded with doxorubicin. The microcatheter was removed. A final contrast injection was performed through the 5 French catheter demonstrating overall decreased tumor blush. The catheter was removed. An additional 2.5 mg verapamil and 200 mcg nitroglycerin were administered through the radial sheath. The radial sheath was then removed and hemostasis attained with the assistance of a TR band. The patient tolerated the procedure well. IMPRESSION: Technically successful drug-eluting bead chemoembolization.  Electronically Signed   By: Jacqulynn Cadet M.D.   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 He presents today for drug-eluting bead chemoembolization. EXAM: Drug-eluting bead chemoembolization 1. Ultrasound-guided access, left radial artery 2. Catheterization of the celiac artery with arteriogram 3. Catheterization of the common hepatic artery with arteriogram 4. Catheterization of the proper hepatic artery with arteriogram 5. Catheterization of the right hepatic artery with arteriogram 6. Catheterization of the segment 6 branch of the hepatic artery with arteriogram 7. Drug-eluting bead chemoembolization 8. Catheterization of an accessory segment 6 branch of the hepatic artery with arteriogram 9. Drug-eluting bead chemoembolization MEDICATIONS: Zosyn 3.375 g. The antibiotic was administered within 1 hour of the procedure. Additionally, 4 mg Decadron was administered intravenously. ANESTHESIA/SEDATION: Moderate (conscious) sedation was employed during this procedure. A total of Versed 2 mg and Fentanyl 100 mcg was administered intravenously. Moderate Sedation Time: 85 minutes. The patient's level of consciousness  and vital signs were monitored continuously by radiology nursing throughout the procedure under my direct supervision. CONTRAST:  100 mL Isovue 370 FLUOROSCOPY TIME:  Fluoroscopy Time: 27 minutes 54 seconds (1145 mGy). COMPLICATIONS: None immediate. PROCEDURE: Informed consent was obtained from the patient following explanation of the procedure, risks, benefits and alternatives. The patient understands, agrees and consents for the procedure. All questions were addressed. A time out was performed prior to the initiation of the procedure. Maximal barrier sterile technique utilized including caps, mask, sterile gowns, sterile gloves, large sterile drape, hand hygiene, and Betadine prep. The left radial artery was interrogated with ultrasound and found to be widely patent and of adequate diameter. An image was obtained and stored for the medical record. Local anesthesia was attained by infiltration with 1% lidocaine. Under real-time sonographic guidance, the vessel was punctured with a 21 gauge micropuncture needle. Using standard technique, the initial micro needle was exchanged over a 0.021 micro wire for a hydrophilic 5 French slim arterial sheath. The sheath was flushed and then a radial cocktail consisting of 5000 units heparin, 2.5 mg Verapamil and 200 mcg nitroglycerine was injected through the sheath. An ultimate 1 catheter was advanced over a Bentson wire into the abdominal aorta. The catheter was used to select the celiac axis. A celiac arteriogram was performed. The left gastric artery is hypertrophic. The common hepatic artery was successfully identified. The catheter was then navigated into the common hepatic artery. An arteriogram was performed. Conventional hepatic arterial anatomy. 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 There appear to be multiple feeding arteries. The catheter was next advanced into the proper hepatic artery and additional arteriography was performed in multiple  obliquities. The mass appears to be fed from a combination of a segment 6 and accessory segment 6 hepatic artery. A 150 cm Renegade high flow microcatheter was then advanced coaxially through the 5 French catheter over a Fathom 14 wire. The microcatheter was advanced into the right hepatic artery. Right hepatic arteriography was performed in multiple obliquities identifying the origins of the segment 6 and accessory segment 6 hepatic arteries. The microcatheter was successfully navigated into the segment 6 hepatic artery. Arteriography was performed confirming significant hypervascularity and tumor blush. The dominant segment 6 hepatic artery supplies approximately 60-70% of the overall tumor volume. Chemoembolization was then performed. 1.2 vials of 100-300 micron LC beads loaded with doxorubicin were injected. Overall flow was diminished, but stasis was not achieved. The  microcatheter was then flushed and brought back more proximally to access the accessory segment 6 hepatic artery. The accessory segment 6 hepatic artery was successfully catheterized. Contrast injection was performed confirming tumor blush in the other 30% of the tumor volume. Chemoembolization was performed using a 0.8 vials of 100-300 micron LC beads loaded with doxorubicin. The microcatheter was removed. A final contrast injection was performed through the 5 French catheter demonstrating overall decreased tumor blush. The catheter was removed. An additional 2.5 mg verapamil and 200 mcg nitroglycerin were administered through the radial sheath. The radial sheath was then removed and hemostasis attained with the assistance of a TR band. The patient tolerated the procedure well. IMPRESSION: Technically successful drug-eluting bead chemoembolization. Electronically Signed   By: Jacqulynn Cadet M.D.   On: 07/19/2018 14:53   Ir Angiogram Selective Each Additional Vessel  Result Date: 07/19/2018 INDICATION: 67 year old male with alcoholic and  HCV cirrhosis complicated by hepatocellular carcinoma. He presents today for drug-eluting bead chemoembolization. EXAM: Drug-eluting bead chemoembolization 1. Ultrasound-guided access, left radial artery 2. Catheterization of the celiac artery with arteriogram 3. Catheterization of the common hepatic artery with arteriogram 4. Catheterization of the proper hepatic artery with arteriogram 5. Catheterization of the right hepatic artery with arteriogram 6. Catheterization of the segment 6 branch of the hepatic artery with arteriogram 7. Drug-eluting bead chemoembolization 8. Catheterization of an accessory segment 6 branch of the hepatic artery with arteriogram 9. Drug-eluting bead chemoembolization MEDICATIONS: Zosyn 3.375 g. The antibiotic was administered within 1 hour of the procedure. Additionally, 4 mg Decadron was administered intravenously. ANESTHESIA/SEDATION: Moderate (conscious) sedation was employed during this procedure. A total of Versed 2 mg and Fentanyl 100 mcg was administered intravenously. Moderate Sedation Time: 85 minutes. The patient's level of consciousness and vital signs were monitored continuously by radiology nursing throughout the procedure under my direct supervision. CONTRAST:  100 mL Isovue 370 FLUOROSCOPY TIME:  Fluoroscopy Time: 27 minutes 54 seconds (1145 mGy). COMPLICATIONS: None immediate. PROCEDURE: Informed consent was obtained from the patient following explanation of the procedure, risks, benefits and alternatives. The patient understands, agrees and consents for the procedure. All questions were addressed. A time out was performed prior to the initiation of the procedure. Maximal barrier sterile technique utilized including caps, mask, sterile gowns, sterile gloves, large sterile drape, hand hygiene, and Betadine prep. The left radial artery was interrogated with ultrasound and found to be widely patent and of adequate diameter. An image was obtained and stored for the medical  record. Local anesthesia was attained by infiltration with 1% lidocaine. Under real-time sonographic guidance, the vessel was punctured with a 21 gauge micropuncture needle. Using standard technique, the initial micro needle was exchanged over a 0.021 micro wire for a hydrophilic 5 French slim arterial sheath. The sheath was flushed and then a radial cocktail consisting of 5000 units heparin, 2.5 mg Verapamil and 200 mcg nitroglycerine was injected through the sheath. An ultimate 1 catheter was advanced over a Bentson wire into the abdominal aorta. The catheter was used to select the celiac axis. A celiac arteriogram was performed. The left gastric artery is hypertrophic. The common hepatic artery was successfully identified. The catheter was then navigated into the common hepatic artery. An arteriogram was performed. Conventional hepatic arterial anatomy. 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 There appear to be multiple feeding arteries. The catheter was next advanced into the proper hepatic artery and additional arteriography was performed in multiple obliquities. The mass appears to be fed from  a combination of a segment 6 and accessory segment 6 hepatic artery. A 150 cm Renegade high flow microcatheter was then advanced coaxially through the 5 French catheter over a Fathom 14 wire. The microcatheter was advanced into the right hepatic artery. Right hepatic arteriography was performed in multiple obliquities identifying the origins of the segment 6 and accessory segment 6 hepatic arteries. The microcatheter was successfully navigated into the segment 6 hepatic artery. Arteriography was performed confirming significant hypervascularity and tumor blush. The dominant segment 6 hepatic artery supplies approximately 60-70% of the overall tumor volume. Chemoembolization was then performed. 1.2 vials of 100-300 micron LC beads loaded with doxorubicin were injected. Overall flow was diminished, but  stasis was not achieved. The microcatheter was then flushed and brought back more proximally to access the accessory segment 6 hepatic artery. The accessory segment 6 hepatic artery was successfully catheterized. Contrast injection was performed confirming tumor blush in the other 30% of the tumor volume. Chemoembolization was performed using a 0.8 vials of 100-300 micron LC beads loaded with doxorubicin. The microcatheter was removed. A final contrast injection was performed through the 5 French catheter demonstrating overall decreased tumor blush. The catheter was removed. An additional 2.5 mg verapamil and 200 mcg nitroglycerin were administered through the radial sheath. The radial sheath was then removed and hemostasis attained with the assistance of a TR band. The patient tolerated the procedure well. IMPRESSION: Technically successful drug-eluting bead chemoembolization. Electronically Signed   By: Jacqulynn Cadet M.D.   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 He presents today for drug-eluting bead chemoembolization. EXAM: Drug-eluting bead chemoembolization 1. Ultrasound-guided access, left radial artery 2. Catheterization of the celiac artery with arteriogram 3. Catheterization of the common hepatic artery with arteriogram 4. Catheterization of the proper hepatic artery with arteriogram 5. Catheterization of the right hepatic artery with arteriogram 6. Catheterization of the segment 6 branch of the hepatic artery with arteriogram 7. Drug-eluting bead chemoembolization 8. Catheterization of an accessory segment 6 branch of the hepatic artery with arteriogram 9. Drug-eluting bead chemoembolization MEDICATIONS: Zosyn 3.375 g. The antibiotic was administered within 1 hour of the procedure. Additionally, 4 mg Decadron was administered intravenously.  ANESTHESIA/SEDATION: Moderate (conscious) sedation was employed during this procedure. A total of Versed 2 mg and Fentanyl 100 mcg was administered intravenously. Moderate Sedation Time: 85 minutes. The patient's level of consciousness and vital signs were monitored continuously by radiology nursing throughout the procedure under my direct supervision. CONTRAST:  100 mL Isovue 370 FLUOROSCOPY TIME:  Fluoroscopy Time: 27 minutes 54 seconds (1145 mGy). COMPLICATIONS: None immediate. PROCEDURE: Informed consent was obtained from the patient following explanation of the procedure, risks, benefits and alternatives. The patient understands, agrees and consents for the procedure. All questions were addressed. A time out was performed prior to the initiation of the procedure. Maximal barrier sterile technique utilized including caps, mask, sterile gowns, sterile gloves, large sterile drape, hand hygiene, and Betadine prep. The left radial artery was interrogated with ultrasound and found to be widely patent and of adequate diameter. An image was obtained and stored for the medical record. Local anesthesia was attained by infiltration with 1% lidocaine. Under real-time sonographic guidance, the vessel was punctured with a 21 gauge micropuncture needle. Using standard technique, the initial micro needle was exchanged over a 0.021 micro wire for a hydrophilic 5 French slim arterial sheath. The sheath was  flushed and then a radial cocktail consisting of 5000 units heparin, 2.5 mg Verapamil and 200 mcg nitroglycerine was injected through the sheath. An ultimate 1 catheter was advanced over a Bentson wire into the abdominal aorta. The catheter was used to select the celiac axis. A celiac arteriogram was performed. The left gastric artery is hypertrophic. The common hepatic artery was successfully identified. The catheter was then navigated into the common hepatic artery. An arteriogram was performed. Conventional hepatic arterial  anatomy. 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 There appear to be multiple feeding arteries. The catheter was next advanced into the proper hepatic artery and additional arteriography was performed in multiple obliquities. The mass appears to be fed from a combination of a segment 6 and accessory segment 6 hepatic artery. A 150 cm Renegade high flow microcatheter was then advanced coaxially through the 5 French catheter over a Fathom 14 wire. The microcatheter was advanced into the right hepatic artery. Right hepatic arteriography was performed in multiple obliquities identifying the origins of the segment 6 and accessory segment 6 hepatic arteries. The microcatheter was successfully navigated into the segment 6 hepatic artery. Arteriography was performed confirming significant hypervascularity and tumor blush. The dominant segment 6 hepatic artery supplies approximately 60-70% of the overall tumor volume. Chemoembolization was then performed. 1.2 vials of 100-300 micron LC beads loaded with doxorubicin were injected. Overall flow was diminished, but stasis was not achieved. The microcatheter was then flushed and brought back more proximally to access the accessory segment 6 hepatic artery. The accessory segment 6 hepatic artery was successfully catheterized. Contrast injection was performed confirming tumor blush in the other 30% of the tumor volume. Chemoembolization was performed using a 0.8 vials of 100-300 micron LC beads loaded with doxorubicin. The microcatheter was removed. A final contrast injection was performed through the 5 French catheter demonstrating overall decreased tumor blush. The catheter was removed. An additional 2.5 mg verapamil and 200 mcg nitroglycerin were administered through the radial sheath. The radial sheath was then removed and hemostasis attained with the assistance of a TR band. The patient tolerated the procedure well. IMPRESSION: Technically successful  drug-eluting bead chemoembolization. Electronically Signed   By: Jacqulynn Cadet M.D.   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 He presents today for drug-eluting bead chemoembolization. EXAM: Drug-eluting bead chemoembolization 1. Ultrasound-guided access, left radial artery 2. Catheterization of the celiac artery with arteriogram 3. Catheterization of the common hepatic artery with arteriogram 4. Catheterization of the proper hepatic artery with arteriogram 5. Catheterization of the right hepatic artery with arteriogram 6. Catheterization of the segment 6 branch of the hepatic artery with arteriogram 7. Drug-eluting bead chemoembolization 8. Catheterization of an accessory segment 6 branch of the hepatic artery with arteriogram 9. Drug-eluting bead chemoembolization MEDICATIONS: Zosyn 3.375 g. The antibiotic was administered within 1 hour of the procedure. Additionally, 4 mg Decadron was administered intravenously. ANESTHESIA/SEDATION: Moderate (conscious) sedation was employed during this procedure. A total of Versed 2 mg and Fentanyl 100 mcg was administered intravenously. Moderate Sedation Time: 85 minutes. The patient's level of consciousness and vital signs were monitored continuously by radiology nursing throughout the procedure under my direct supervision. CONTRAST:  100 mL Isovue 370 FLUOROSCOPY TIME:  Fluoroscopy Time: 27 minutes 54 seconds (1145 mGy). COMPLICATIONS: None immediate. PROCEDURE: Informed consent was obtained from the patient following explanation of the procedure, risks, benefits  and alternatives. The patient understands, agrees and consents for the procedure. All questions were addressed. A time out was performed prior to the initiation of the procedure. Maximal barrier sterile technique utilized including caps, mask, sterile gowns, sterile  gloves, large sterile drape, hand hygiene, and Betadine prep. The left radial artery was interrogated with ultrasound and found to be widely patent and of adequate diameter. An image was obtained and stored for the medical record. Local anesthesia was attained by infiltration with 1% lidocaine. Under real-time sonographic guidance, the vessel was punctured with a 21 gauge micropuncture needle. Using standard technique, the initial micro needle was exchanged over a 0.021 micro wire for a hydrophilic 5 French slim arterial sheath. The sheath was flushed and then a radial cocktail consisting of 5000 units heparin, 2.5 mg Verapamil and 200 mcg nitroglycerine was injected through the sheath. An ultimate 1 catheter was advanced over a Bentson wire into the abdominal aorta. The catheter was used to select the celiac axis. A celiac arteriogram was performed. The left gastric artery is hypertrophic. The common hepatic artery was successfully identified. The catheter was then navigated into the common hepatic artery. An arteriogram was performed. Conventional hepatic arterial anatomy. 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 There appear to be multiple feeding arteries. The catheter was next advanced into the proper hepatic artery and additional arteriography was performed in multiple obliquities. The mass appears to be fed from a combination of a segment 6 and accessory segment 6 hepatic artery. A 150 cm Renegade high flow microcatheter was then advanced coaxially through the 5 French catheter over a Fathom 14 wire. The microcatheter was advanced into the right hepatic artery. Right hepatic arteriography was performed in multiple obliquities identifying the origins of the segment 6 and accessory segment 6 hepatic arteries. The microcatheter was successfully navigated into the segment 6 hepatic artery. Arteriography was performed confirming significant hypervascularity and tumor blush. The dominant segment 6  hepatic artery supplies approximately 60-70% of the overall tumor volume. Chemoembolization was then performed. 1.2 vials of 100-300 micron LC beads loaded with doxorubicin were injected. Overall flow was diminished, but stasis was not achieved. The microcatheter was then flushed and brought back more proximally to access the accessory segment 6 hepatic artery. The accessory segment 6 hepatic artery was successfully catheterized. Contrast injection was performed confirming tumor blush in the other 30% of the tumor volume. Chemoembolization was performed using a 0.8 vials of 100-300 micron LC beads loaded with doxorubicin. The microcatheter was removed. A final contrast injection was performed through the 5 French catheter demonstrating overall decreased tumor blush. The catheter was removed. An additional 2.5 mg verapamil and 200 mcg nitroglycerin were administered through the radial sheath. The radial sheath was then removed and hemostasis attained with the assistance of a TR band. The patient tolerated the procedure well. IMPRESSION: Technically successful drug-eluting bead chemoembolization. Electronically Signed   By: Jacqulynn Cadet M.D.   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 He presents today for drug-eluting bead chemoembolization. EXAM: Drug-eluting bead chemoembolization 1. Ultrasound-guided access, left radial artery 2. Catheterization of the celiac artery with arteriogram 3. Catheterization of the common hepatic artery with arteriogram 4. Catheterization of the proper hepatic artery with arteriogram 5. Catheterization of the right hepatic artery with arteriogram 6. Catheterization of the segment 6 branch of the hepatic artery with arteriogram  7. Drug-eluting bead chemoembolization 8. Catheterization of an accessory segment 6  branch of the hepatic artery with arteriogram 9. Drug-eluting bead chemoembolization MEDICATIONS: Zosyn 3.375 g. The antibiotic was administered within 1 hour of the procedure. Additionally, 4 mg Decadron was administered intravenously. ANESTHESIA/SEDATION: Moderate (conscious) sedation was employed during this procedure. A total of Versed 2 mg and Fentanyl 100 mcg was administered intravenously. Moderate Sedation Time: 85 minutes. The patient's level of consciousness and vital signs were monitored continuously by radiology nursing throughout the procedure under my direct supervision. CONTRAST:  100 mL Isovue 370 FLUOROSCOPY TIME:  Fluoroscopy Time: 27 minutes 54 seconds (1145 mGy). COMPLICATIONS: None immediate. PROCEDURE: Informed consent was obtained from the patient following explanation of the procedure, risks, benefits and alternatives. The patient understands, agrees and consents for the procedure. All questions were addressed. A time out was performed prior to the initiation of the procedure. Maximal barrier sterile technique utilized including caps, mask, sterile gowns, sterile gloves, large sterile drape, hand hygiene, and Betadine prep. The left radial artery was interrogated with ultrasound and found to be widely patent and of adequate diameter. An image was obtained and stored for the medical record. Local anesthesia was attained by infiltration with 1% lidocaine. Under real-time sonographic guidance, the vessel was punctured with a 21 gauge micropuncture needle. Using standard technique, the initial micro needle was exchanged over a 0.021 micro wire for a hydrophilic 5 French slim arterial sheath. The sheath was flushed and then a radial cocktail consisting of 5000 units heparin, 2.5 mg Verapamil and 200 mcg nitroglycerine was injected through the sheath. An ultimate 1 catheter was advanced over a Bentson wire into the abdominal aorta. The catheter was used to select the celiac axis. A celiac  arteriogram was performed. The left gastric artery is hypertrophic. The common hepatic artery was successfully identified. The catheter was then navigated into the common hepatic artery. An arteriogram was performed. Conventional hepatic arterial anatomy. 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 There appear to be multiple feeding arteries. The catheter was next advanced into the proper hepatic artery and additional arteriography was performed in multiple obliquities. The mass appears to be fed from a combination of a segment 6 and accessory segment 6 hepatic artery. A 150 cm Renegade high flow microcatheter was then advanced coaxially through the 5 French catheter over a Fathom 14 wire. The microcatheter was advanced into the right hepatic artery. Right hepatic arteriography was performed in multiple obliquities identifying the origins of the segment 6 and accessory segment 6 hepatic arteries. The microcatheter was successfully navigated into the segment 6 hepatic artery. Arteriography was performed confirming significant hypervascularity and tumor blush. The dominant segment 6 hepatic artery supplies approximately 60-70% of the overall tumor volume. Chemoembolization was then performed. 1.2 vials of 100-300 micron LC beads loaded with doxorubicin were injected. Overall flow was diminished, but stasis was not achieved. The microcatheter was then flushed and brought back more proximally to access the accessory segment 6 hepatic artery. The accessory segment 6 hepatic artery was successfully catheterized. Contrast injection was performed confirming tumor blush in the other 30% of the tumor volume. Chemoembolization was performed using a 0.8 vials of 100-300 micron LC beads loaded with doxorubicin. The microcatheter was removed. A final contrast injection was performed through the 5 French catheter demonstrating overall decreased tumor blush. The catheter was removed. An additional 2.5 mg verapamil  and 200 mcg nitroglycerin were administered through the radial sheath. The radial sheath was then removed and  hemostasis attained with the assistance of a TR band. The patient tolerated the procedure well. IMPRESSION: Technically successful drug-eluting bead chemoembolization. Electronically Signed   By: Jacqulynn Cadet M.D.   On: 07/19/2018 14:53    Labs:  CBC: Recent Labs    05/16/18 1219 07/19/18 0924 07/20/18 0400  WBC 10.7 6.7 13.0*  HGB 13.0 12.6* 12.2*  HCT 39.1 40.1 38.0*  PLT 199 222 227    COAGS: Recent Labs    05/31/18 0941 07/19/18 0924  INR 1.0 0.97    BMP: Recent Labs    10/07/17 0954 05/16/18 1219 05/31/18 0941 05/31/18 1434 07/19/18 0924 07/20/18 0400  NA 137 136  --  135 135 132*  K 4.3 4.9  --   --  4.0 4.2  CL 98 94*  --   --  102 102  CO2 23 16*  --   --  24 19*  GLUCOSE 106* 58*  --   --  110* 114*  BUN 6* 10  --   --  8 6*  CALCIUM 10.4* 10.1  --   --  9.9 9.5  CREATININE 0.74* 0.87 0.73  --  0.77 0.69  GFRNONAA 97 91  --   --  >60 >60  GFRAA 113 105  --   --  >60 >60    LIVER FUNCTION TESTS: Recent Labs    10/07/17 0954 05/16/18 1219 05/31/18 0941 07/19/18 0924 07/20/18 0400  BILITOT 0.9 0.8 0.7 0.8 0.5  AST 93* 185*  --  35 124*  ALT 37 80*  --  23 76*  ALKPHOS 59 66  --  40 39  PROT 7.5 7.5  --  7.7 7.3  ALBUMIN 4.8 4.9*  --  4.5 3.9    Assessment and Plan: Patient with history of alcoholic cirrhosis, HCV, 7.4 cm segment 6 hepatocellular carcinoma; poor surgical candidate.  Seen in consultation by Dr. Laurence Ferrari on 07/07/2018 and deemed an appropriate candidate for liver directed therapy with transarterial chemoembolization; s/p DEB-[facility] of [facility] 12/17 via left RA access; afebrile; WBC 13(6.7), hgb 12.2(12.6), creat 0.69, AST/ALT elevated - not unexpected; t bili nl; above findings d/w Dr. Laurence Ferrari- plan to observe pt additional 24 hrs and if stable, abd pain improving hopefully d/c 12/19; will need f/u in IR clinic in 2-3 weeks  with Dr. Laurence Ferrari as well as prescriptions for cipro 500 mg bid for 1 week, oxycodone IR 5 mg Q 4-6 HRS (#15) , no refills and zofran 4 mg q 6-8 hrs (#20).    Electronically Signed: D. Rowe Robert, PA-C 07/20/2018, 1:39 PM   I spent a total of 20 minutes at the the patient's bedside AND on the patient's hospital floor or unit, greater than 50% of which was counseling/coordinating care for hepatic chemoembolization    Patient ID: Calvin Gardner, male   DOB: [birth year], 67 y.o.   MRN: [id number]

## 2018-07-20 NOTE — Plan of Care (Signed)
Plan of care reviewed and discussed with the patient. 

## 2018-07-21 DIAGNOSIS — B1920 Unspecified viral hepatitis C without hepatic coma: Secondary | ICD-10-CM | POA: Diagnosis not present

## 2018-07-21 DIAGNOSIS — I10 Essential (primary) hypertension: Secondary | ICD-10-CM | POA: Diagnosis not present

## 2018-07-21 DIAGNOSIS — K7030 Alcoholic cirrhosis of liver without ascites: Secondary | ICD-10-CM | POA: Diagnosis not present

## 2018-07-21 DIAGNOSIS — M1990 Unspecified osteoarthritis, unspecified site: Secondary | ICD-10-CM | POA: Diagnosis not present

## 2018-07-21 DIAGNOSIS — C220 Liver cell carcinoma: Secondary | ICD-10-CM | POA: Diagnosis not present

## 2018-07-21 DIAGNOSIS — E785 Hyperlipidemia, unspecified: Secondary | ICD-10-CM | POA: Diagnosis not present

## 2018-07-21 LAB — CBC WITH DIFFERENTIAL/PLATELET
Abs Immature Granulocytes: 0.13 10*3/uL — ABNORMAL HIGH (ref 0.00–0.07)
Basophils Absolute: 0 10*3/uL (ref 0.0–0.1)
Basophils Relative: 0 %
Eosinophils Absolute: 0 10*3/uL (ref 0.0–0.5)
Eosinophils Relative: 0 %
HCT: 35.6 % — ABNORMAL LOW (ref 39.0–52.0)
Hemoglobin: 11.7 g/dL — ABNORMAL LOW (ref 13.0–17.0)
Immature Granulocytes: 1 %
Lymphocytes Relative: 7 %
Lymphs Abs: 1.1 10*3/uL (ref 0.7–4.0)
MCH: 29.5 pg (ref 26.0–34.0)
MCHC: 32.9 g/dL (ref 30.0–36.0)
MCV: 89.9 fL (ref 80.0–100.0)
Monocytes Absolute: 1.7 10*3/uL — ABNORMAL HIGH (ref 0.1–1.0)
Monocytes Relative: 11 %
NEUTROS PCT: 81 %
NRBC: 0 % (ref 0.0–0.2)
Neutro Abs: 12.6 10*3/uL — ABNORMAL HIGH (ref 1.7–7.7)
Platelets: 213 10*3/uL (ref 150–400)
RBC: 3.96 MIL/uL — ABNORMAL LOW (ref 4.22–5.81)
RDW: 12 % (ref 11.5–15.5)
WBC: 15.4 10*3/uL — ABNORMAL HIGH (ref 4.0–10.5)

## 2018-07-21 LAB — URINALYSIS, ROUTINE W REFLEX MICROSCOPIC
Bilirubin Urine: NEGATIVE
Glucose, UA: NEGATIVE mg/dL
Hgb urine dipstick: NEGATIVE
Ketones, ur: NEGATIVE mg/dL
LEUKOCYTES UA: NEGATIVE
NITRITE: NEGATIVE
Protein, ur: NEGATIVE mg/dL
Specific Gravity, Urine: 1.008 (ref 1.005–1.030)
pH: 6 (ref 5.0–8.0)

## 2018-07-21 MED ORDER — ENSURE ENLIVE PO LIQD: 237.0000 mL | Freq: Three times a day (TID) | ORAL | Status: DC

## 2018-07-21 MED ORDER — ADULT MULTIVITAMIN W/MINERALS CH: 1.0000 | ORAL_TABLET | Freq: Every day | ORAL | Status: DC

## 2018-07-21 MED ORDER — ACETAMINOPHEN 325 MG PO TABS
650.0000 mg | ORAL_TABLET | Freq: Four times a day (QID) | ORAL | Status: DC | PRN
  Filled 2018-07-21: qty 2

## 2018-07-21 MED ORDER — IBUPROFEN 200 MG PO TABS
400.0000 mg | ORAL_TABLET | Freq: Four times a day (QID) | ORAL | Status: DC | PRN
  Administered 2018-07-21: 400 mg via ORAL
  Filled 2018-07-21: qty 2

## 2018-07-21 NOTE — Progress Notes (Signed)
Assessment unchanged. Pt verbalized understanding of dc instructions through teach back including follow up care and when to call the doctor. Scripts given to by [employer], IR PA. Discharged via foot per request to front entrance accompanied by son and NT.

## 2018-07-21 NOTE — Discharge Summary (Signed)
Patient ID: Calvin Gardner MRN: [id number] DOB/AGE: 1951/05/06 67 y.o.  Admit date: 07/19/2018 Discharge date: 07/21/2018  Supervising Physician: Corrie Mckusick  Patient Status: [facility] - In-pt  Admission Diagnoses: Hepatocellular carcinoma  Discharge Diagnoses:  Active Problems:   Hepatocellular carcinoma [facility])   Discharged Condition: stable  Hospital Course:  Patient presented to [facility] 07/19/2018 for an image-guided hepatic angiogram with DEB-TACE via left radial artery access with Dr. Laurence Ferrari. Procedure occurred without major complications and patient was transferred to floor for overnight observation. No major events occurred overnight.  One 07/20/2018, patient had RUQ discomfort along with increase in WBCs and decrease in Hgb. Patient was kept overnight for continued observation and pain management. Overnight, he spiked a fever of 102 F that was well controlled with Tylenol.  Patient awake and alert laying in bed. Complains of RUQ soreness. States it is not painful, just sore. Fever and pain controlled with medications. Discussed case with Dr. Laurence Ferrari who recommends patient ready for discharge. Plan to discharge home today and follow-up with Dr. Laurence Ferrari at clinic in 2-3 weeks. Prescriptions for Ciprofloxacin (500 mg tablets taken twice daily), Oxycodone (IR 5 mg tablets taken Q4-6 hours PRN abdominal pain), and Zofran (4mg  tablets taken Q6-8 hours PRN nausea) given to patient.  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 He presents today for drug-eluting bead chemoembolization. EXAM: Drug-eluting bead chemoembolization 1. Ultrasound-guided access, left radial artery 2. Catheterization of the celiac artery with arteriogram 3. Catheterization of the common hepatic artery with arteriogram 4. Catheterization of  the proper hepatic artery with arteriogram 5. Catheterization of the right hepatic artery with arteriogram 6. Catheterization of the segment 6 branch of the hepatic artery with arteriogram 7. Drug-eluting bead chemoembolization 8. Catheterization of an accessory segment 6 branch of the hepatic artery with arteriogram 9. Drug-eluting bead chemoembolization MEDICATIONS: Zosyn 3.375 g. The antibiotic was administered within 1 hour of the procedure. Additionally, 4 mg Decadron was administered intravenously. ANESTHESIA/SEDATION: Moderate (conscious) sedation was employed during this procedure. A total of Versed 2 mg and Fentanyl 100 mcg was administered intravenously. Moderate Sedation Time: 85 minutes. The patient's level of consciousness and vital signs were monitored continuously by radiology nursing throughout the procedure under my direct supervision. CONTRAST:  100 mL Isovue 370 FLUOROSCOPY TIME:  Fluoroscopy Time: 27 minutes 54 seconds (1145 mGy). COMPLICATIONS: None immediate. PROCEDURE: Informed consent was obtained from the patient following explanation of the procedure, risks, benefits and alternatives. The patient understands, agrees and consents for the procedure. All questions were addressed. A time out was performed prior to the initiation of the procedure. Maximal barrier sterile technique utilized including caps, mask, sterile gowns, sterile gloves, large sterile drape, hand hygiene, and Betadine prep. The left radial artery was interrogated with ultrasound and found to be widely patent and of adequate diameter. An image was obtained and stored for the medical record. Local anesthesia was attained by infiltration with 1% lidocaine. Under real-time sonographic guidance, the vessel was punctured with a 21 gauge micropuncture needle. Using standard technique, the initial micro needle was exchanged over a 0.021 micro wire for a hydrophilic 5 French slim arterial sheath. The sheath was flushed and then a  radial cocktail consisting of 5000 units heparin, 2.5 mg Verapamil and 200 mcg nitroglycerine was injected through the sheath. An ultimate 1 catheter was advanced over a Bentson wire into the abdominal aorta. The catheter was  used to select the celiac axis. A celiac arteriogram was performed. The left gastric artery is hypertrophic. The common hepatic artery was successfully identified. The catheter was then navigated into the common hepatic artery. An arteriogram was performed. Conventional hepatic arterial anatomy. 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 There appear to be multiple feeding arteries. The catheter was next advanced into the proper hepatic artery and additional arteriography was performed in multiple obliquities. The mass appears to be fed from a combination of a segment 6 and accessory segment 6 hepatic artery. A 150 cm Renegade high flow microcatheter was then advanced coaxially through the 5 French catheter over a Fathom 14 wire. The microcatheter was advanced into the right hepatic artery. Right hepatic arteriography was performed in multiple obliquities identifying the origins of the segment 6 and accessory segment 6 hepatic arteries. The microcatheter was successfully navigated into the segment 6 hepatic artery. Arteriography was performed confirming significant hypervascularity and tumor blush. The dominant segment 6 hepatic artery supplies approximately 60-70% of the overall tumor volume. Chemoembolization was then performed. 1.2 vials of 100-300 micron LC beads loaded with doxorubicin were injected. Overall flow was diminished, but stasis was not achieved. The microcatheter was then flushed and brought back more proximally to access the accessory segment 6 hepatic artery. The accessory segment 6 hepatic artery was successfully catheterized. Contrast injection was performed confirming tumor blush in the other 30% of the tumor volume. Chemoembolization was performed using a  0.8 vials of 100-300 micron LC beads loaded with doxorubicin. The microcatheter was removed. A final contrast injection was performed through the 5 French catheter demonstrating overall decreased tumor blush. The catheter was removed. An additional 2.5 mg verapamil and 200 mcg nitroglycerin were administered through the radial sheath. The radial sheath was then removed and hemostasis attained with the assistance of a TR band. The patient tolerated the procedure well. IMPRESSION: Technically successful drug-eluting bead chemoembolization. Electronically Signed   By: Jacqulynn Cadet M.D.   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 He presents today for drug-eluting bead chemoembolization. EXAM: Drug-eluting bead chemoembolization 1. Ultrasound-guided access, left radial artery 2. Catheterization of the celiac artery with arteriogram 3. Catheterization of the common hepatic artery with arteriogram 4. Catheterization of the proper hepatic artery with arteriogram 5. Catheterization of the right hepatic artery with arteriogram 6. Catheterization of the segment 6 branch of the hepatic artery with arteriogram 7. Drug-eluting bead chemoembolization 8. Catheterization of an accessory segment 6 branch of the hepatic artery with arteriogram 9. Drug-eluting bead chemoembolization MEDICATIONS: Zosyn 3.375 g. The antibiotic was administered within 1 hour of the procedure. Additionally, 4 mg Decadron was administered intravenously. ANESTHESIA/SEDATION: Moderate (conscious) sedation was employed during this procedure. A total of Versed 2 mg and Fentanyl 100 mcg was administered intravenously. Moderate Sedation Time: 85 minutes. The patient's level of consciousness and vital signs were monitored continuously by radiology nursing throughout the procedure under my direct  supervision. CONTRAST:  100 mL Isovue 370 FLUOROSCOPY TIME:  Fluoroscopy Time: 27 minutes 54 seconds (1145 mGy). COMPLICATIONS: None immediate. PROCEDURE: Informed consent was obtained from the patient following explanation of the procedure, risks, benefits and alternatives. The patient understands, agrees and consents for the procedure. All questions were addressed. A time out was performed prior to the initiation of the procedure. Maximal barrier sterile technique utilized including caps, mask, sterile gowns, sterile gloves, large  sterile drape, hand hygiene, and Betadine prep. The left radial artery was interrogated with ultrasound and found to be widely patent and of adequate diameter. An image was obtained and stored for the medical record. Local anesthesia was attained by infiltration with 1% lidocaine. Under real-time sonographic guidance, the vessel was punctured with a 21 gauge micropuncture needle. Using standard technique, the initial micro needle was exchanged over a 0.021 micro wire for a hydrophilic 5 French slim arterial sheath. The sheath was flushed and then a radial cocktail consisting of 5000 units heparin, 2.5 mg Verapamil and 200 mcg nitroglycerine was injected through the sheath. An ultimate 1 catheter was advanced over a Bentson wire into the abdominal aorta. The catheter was used to select the celiac axis. A celiac arteriogram was performed. The left gastric artery is hypertrophic. The common hepatic artery was successfully identified. The catheter was then navigated into the common hepatic artery. An arteriogram was performed. Conventional hepatic arterial anatomy. 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 There appear to be multiple feeding arteries. The catheter was next advanced into the proper hepatic artery and additional arteriography was performed in multiple obliquities. The mass appears to be fed from a combination of a segment 6 and accessory segment 6 hepatic  artery. A 150 cm Renegade high flow microcatheter was then advanced coaxially through the 5 French catheter over a Fathom 14 wire. The microcatheter was advanced into the right hepatic artery. Right hepatic arteriography was performed in multiple obliquities identifying the origins of the segment 6 and accessory segment 6 hepatic arteries. The microcatheter was successfully navigated into the segment 6 hepatic artery. Arteriography was performed confirming significant hypervascularity and tumor blush. The dominant segment 6 hepatic artery supplies approximately 60-70% of the overall tumor volume. Chemoembolization was then performed. 1.2 vials of 100-300 micron LC beads loaded with doxorubicin were injected. Overall flow was diminished, but stasis was not achieved. The microcatheter was then flushed and brought back more proximally to access the accessory segment 6 hepatic artery. The accessory segment 6 hepatic artery was successfully catheterized. Contrast injection was performed confirming tumor blush in the other 30% of the tumor volume. Chemoembolization was performed using a 0.8 vials of 100-300 micron LC beads loaded with doxorubicin. The microcatheter was removed. A final contrast injection was performed through the 5 French catheter demonstrating overall decreased tumor blush. The catheter was removed. An additional 2.5 mg verapamil and 200 mcg nitroglycerin were administered through the radial sheath. The radial sheath was then removed and hemostasis attained with the assistance of a TR band. The patient tolerated the procedure well. IMPRESSION: Technically successful drug-eluting bead chemoembolization. Electronically Signed   By: Jacqulynn Cadet M.D.   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 He presents today for drug-eluting bead  chemoembolization. EXAM: Drug-eluting bead chemoembolization 1. Ultrasound-guided access, left radial artery 2. Catheterization of the celiac artery with arteriogram 3. Catheterization of the common hepatic artery with arteriogram 4. Catheterization of the proper hepatic artery with arteriogram 5. Catheterization of the right hepatic artery with arteriogram 6. Catheterization of the segment 6 branch of the hepatic artery with arteriogram 7. Drug-eluting bead chemoembolization 8. Catheterization of an accessory segment 6 branch of the hepatic artery with arteriogram 9. Drug-eluting bead chemoembolization MEDICATIONS: Zosyn 3.375 g. The antibiotic was administered within 1 hour of the procedure. Additionally, 4 mg Decadron was administered intravenously.  ANESTHESIA/SEDATION: Moderate (conscious) sedation was employed during this procedure. A total of Versed 2 mg and Fentanyl 100 mcg was administered intravenously. Moderate Sedation Time: 85 minutes. The patient's level of consciousness and vital signs were monitored continuously by radiology nursing throughout the procedure under my direct supervision. CONTRAST:  100 mL Isovue 370 FLUOROSCOPY TIME:  Fluoroscopy Time: 27 minutes 54 seconds (1145 mGy). COMPLICATIONS: None immediate. PROCEDURE: Informed consent was obtained from the patient following explanation of the procedure, risks, benefits and alternatives. The patient understands, agrees and consents for the procedure. All questions were addressed. A time out was performed prior to the initiation of the procedure. Maximal barrier sterile technique utilized including caps, mask, sterile gowns, sterile gloves, large sterile drape, hand hygiene, and Betadine prep. The left radial artery was interrogated with ultrasound and found to be widely patent and of adequate diameter. An image was obtained and stored for the medical record. Local anesthesia was attained by infiltration with 1% lidocaine. Under real-time  sonographic guidance, the vessel was punctured with a 21 gauge micropuncture needle. Using standard technique, the initial micro needle was exchanged over a 0.021 micro wire for a hydrophilic 5 French slim arterial sheath. The sheath was flushed and then a radial cocktail consisting of 5000 units heparin, 2.5 mg Verapamil and 200 mcg nitroglycerine was injected through the sheath. An ultimate 1 catheter was advanced over a Bentson wire into the abdominal aorta. The catheter was used to select the celiac axis. A celiac arteriogram was performed. The left gastric artery is hypertrophic. The common hepatic artery was successfully identified. The catheter was then navigated into the common hepatic artery. An arteriogram was performed. Conventional hepatic arterial anatomy. 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 There appear to be multiple feeding arteries. The catheter was next advanced into the proper hepatic artery and additional arteriography was performed in multiple obliquities. The mass appears to be fed from a combination of a segment 6 and accessory segment 6 hepatic artery. A 150 cm Renegade high flow microcatheter was then advanced coaxially through the 5 French catheter over a Fathom 14 wire. The microcatheter was advanced into the right hepatic artery. Right hepatic arteriography was performed in multiple obliquities identifying the origins of the segment 6 and accessory segment 6 hepatic arteries. The microcatheter was successfully navigated into the segment 6 hepatic artery. Arteriography was performed confirming significant hypervascularity and tumor blush. The dominant segment 6 hepatic artery supplies approximately 60-70% of the overall tumor volume. Chemoembolization was then performed. 1.2 vials of 100-300 micron LC beads loaded with doxorubicin were injected. Overall flow was diminished, but stasis was not achieved. The microcatheter was then flushed and brought back more  proximally to access the accessory segment 6 hepatic artery. The accessory segment 6 hepatic artery was successfully catheterized. Contrast injection was performed confirming tumor blush in the other 30% of the tumor volume. Chemoembolization was performed using a 0.8 vials of 100-300 micron LC beads loaded with doxorubicin. The microcatheter was removed. A final contrast injection was performed through the 5 French catheter demonstrating overall decreased tumor blush. The catheter was removed. An additional 2.5 mg verapamil and 200 mcg nitroglycerin were administered through the radial sheath. The radial sheath was then removed and hemostasis attained with the assistance of a TR band. The patient tolerated the procedure well. IMPRESSION: Technically successful drug-eluting bead chemoembolization. Electronically Signed   By: Jacqulynn Cadet M.D.   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 He presents today for drug-eluting bead chemoembolization. EXAM: Drug-eluting bead chemoembolization 1. Ultrasound-guided access, left radial artery 2. Catheterization of the celiac artery with arteriogram 3. Catheterization of the common hepatic artery with arteriogram 4. Catheterization of the proper hepatic artery with arteriogram 5. Catheterization of the right hepatic artery with arteriogram 6. Catheterization of the segment 6 branch of the hepatic artery with arteriogram 7. Drug-eluting bead chemoembolization 8. Catheterization of an accessory segment 6 branch of the hepatic artery with arteriogram 9. Drug-eluting bead chemoembolization MEDICATIONS: Zosyn 3.375 g. The antibiotic was administered within 1 hour of the procedure. Additionally, 4 mg Decadron was administered intravenously. ANESTHESIA/SEDATION: Moderate (conscious) sedation was employed during this  procedure. A total of Versed 2 mg and Fentanyl 100 mcg was administered intravenously. Moderate Sedation Time: 85 minutes. The patient's level of consciousness and vital signs were monitored continuously by radiology nursing throughout the procedure under my direct supervision. CONTRAST:  100 mL Isovue 370 FLUOROSCOPY TIME:  Fluoroscopy Time: 27 minutes 54 seconds (1145 mGy). COMPLICATIONS: None immediate. PROCEDURE: Informed consent was obtained from the patient following explanation of the procedure, risks, benefits and alternatives. The patient understands, agrees and consents for the procedure. All questions were addressed. A time out was performed prior to the initiation of the procedure. Maximal barrier sterile technique utilized including caps, mask, sterile gowns, sterile gloves, large sterile drape, hand hygiene, and Betadine prep. The left radial artery was interrogated with ultrasound and found to be widely patent and of adequate diameter. An image was obtained and stored for the medical record. Local anesthesia was attained by infiltration with 1% lidocaine. Under real-time sonographic guidance, the vessel was punctured with a 21 gauge micropuncture needle. Using standard technique, the initial micro needle was exchanged over a 0.021 micro wire for a hydrophilic 5 French slim arterial sheath. The sheath was flushed and then a radial cocktail consisting of 5000 units heparin, 2.5 mg Verapamil and 200 mcg nitroglycerine was injected through the sheath. An ultimate 1 catheter was advanced over a Bentson wire into the abdominal aorta. The catheter was used to select the celiac axis. A celiac arteriogram was performed. The left gastric artery is hypertrophic. The common hepatic artery was successfully identified. The catheter was then navigated into the common hepatic artery. An arteriogram was performed. Conventional hepatic arterial anatomy. 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 There  appear to be multiple feeding arteries. The catheter was next advanced into the proper hepatic artery and additional arteriography was performed in multiple obliquities. The mass appears to be fed from a combination of a segment 6 and accessory segment 6 hepatic artery. A 150 cm Renegade high flow microcatheter was then advanced coaxially through the 5 French catheter over a Fathom 14 wire. The microcatheter was advanced into the right hepatic artery. Right hepatic arteriography was performed in multiple obliquities identifying the origins of the segment 6 and accessory segment 6 hepatic arteries. The microcatheter was successfully navigated into the segment 6 hepatic artery. Arteriography was performed confirming significant hypervascularity and tumor blush. The dominant segment 6 hepatic artery supplies approximately 60-70% of the overall tumor volume. Chemoembolization was then performed. 1.2 vials of 100-300 micron LC beads loaded with doxorubicin were injected. Overall flow was diminished, but stasis was not achieved. The microcatheter was then flushed and brought back more proximally to access the accessory segment 6 hepatic artery. The accessory segment 6 hepatic artery  was successfully catheterized. Contrast injection was performed confirming tumor blush in the other 30% of the tumor volume. Chemoembolization was performed using a 0.8 vials of 100-300 micron LC beads loaded with doxorubicin. The microcatheter was removed. A final contrast injection was performed through the 5 French catheter demonstrating overall decreased tumor blush. The catheter was removed. An additional 2.5 mg verapamil and 200 mcg nitroglycerin were administered through the radial sheath. The radial sheath was then removed and hemostasis attained with the assistance of a TR band. The patient tolerated the procedure well. IMPRESSION: Technically successful drug-eluting bead chemoembolization. Electronically Signed   By: Jacqulynn Cadet M.D.   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 He presents today for drug-eluting bead chemoembolization. EXAM: Drug-eluting bead chemoembolization 1. Ultrasound-guided access, left radial artery 2. Catheterization of the celiac artery with arteriogram 3. Catheterization of the common hepatic artery with arteriogram 4. Catheterization of the proper hepatic artery with arteriogram 5. Catheterization of the right hepatic artery with arteriogram 6. Catheterization of the segment 6 branch of the hepatic artery with arteriogram 7. Drug-eluting bead chemoembolization 8. Catheterization of an accessory segment 6 branch of the hepatic artery with arteriogram 9. Drug-eluting bead chemoembolization MEDICATIONS: Zosyn 3.375 g. The antibiotic was administered within 1 hour of the procedure. Additionally, 4 mg Decadron was administered intravenously. ANESTHESIA/SEDATION: Moderate (conscious) sedation was employed during this procedure. A total of Versed 2 mg and Fentanyl 100 mcg was administered intravenously. Moderate Sedation Time: 85 minutes. The patient's level of consciousness and vital signs were monitored continuously by radiology nursing throughout the procedure under my direct supervision. CONTRAST:  100 mL Isovue 370 FLUOROSCOPY TIME:  Fluoroscopy Time: 27 minutes 54 seconds (1145 mGy). COMPLICATIONS: None immediate. PROCEDURE: Informed consent was obtained from the patient following explanation of the procedure, risks, benefits and alternatives. The patient understands, agrees and consents for the procedure. All questions were addressed. A time out was performed prior to the initiation of the procedure. Maximal barrier sterile technique utilized including caps, mask, sterile gowns, sterile gloves, large sterile drape, hand hygiene, and Betadine  prep. The left radial artery was interrogated with ultrasound and found to be widely patent and of adequate diameter. An image was obtained and stored for the medical record. Local anesthesia was attained by infiltration with 1% lidocaine. Under real-time sonographic guidance, the vessel was punctured with a 21 gauge micropuncture needle. Using standard technique, the initial micro needle was exchanged over a 0.021 micro wire for a hydrophilic 5 French slim arterial sheath. The sheath was flushed and then a radial cocktail consisting of 5000 units heparin, 2.5 mg Verapamil and 200 mcg nitroglycerine was injected through the sheath. An ultimate 1 catheter was advanced over a Bentson wire into the abdominal aorta. The catheter was used to select the celiac axis. A celiac arteriogram was performed. The left gastric artery is hypertrophic. The common hepatic artery was successfully identified. The catheter was then navigated into the common hepatic artery. An arteriogram was performed. Conventional hepatic arterial anatomy. 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 There appear to be multiple feeding arteries. The catheter was next advanced into the proper hepatic artery and additional arteriography was performed in multiple obliquities. The mass appears to be fed from a combination of a segment 6 and accessory segment 6 hepatic artery. A 150 cm Renegade high flow microcatheter was then advanced coaxially  through the 5 French catheter over a Fathom 14 wire. The microcatheter was advanced into the right hepatic artery. Right hepatic arteriography was performed in multiple obliquities identifying the origins of the segment 6 and accessory segment 6 hepatic arteries. The microcatheter was successfully navigated into the segment 6 hepatic artery. Arteriography was performed confirming significant hypervascularity and tumor blush. The dominant segment 6 hepatic artery supplies approximately 60-70% of the  overall tumor volume. Chemoembolization was then performed. 1.2 vials of 100-300 micron LC beads loaded with doxorubicin were injected. Overall flow was diminished, but stasis was not achieved. The microcatheter was then flushed and brought back more proximally to access the accessory segment 6 hepatic artery. The accessory segment 6 hepatic artery was successfully catheterized. Contrast injection was performed confirming tumor blush in the other 30% of the tumor volume. Chemoembolization was performed using a 0.8 vials of 100-300 micron LC beads loaded with doxorubicin. The microcatheter was removed. A final contrast injection was performed through the 5 French catheter demonstrating overall decreased tumor blush. The catheter was removed. An additional 2.5 mg verapamil and 200 mcg nitroglycerin were administered through the radial sheath. The radial sheath was then removed and hemostasis attained with the assistance of a TR band. The patient tolerated the procedure well. IMPRESSION: Technically successful drug-eluting bead chemoembolization. Electronically Signed   By: Jacqulynn Cadet M.D.   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 He presents today for drug-eluting bead chemoembolization. EXAM: Drug-eluting bead chemoembolization 1. Ultrasound-guided access, left radial artery 2. Catheterization of the celiac artery with arteriogram 3. Catheterization of the common hepatic artery with arteriogram 4. Catheterization of the proper hepatic artery with arteriogram 5. Catheterization of the right hepatic artery with arteriogram 6. Catheterization of the segment 6 branch of the hepatic artery with arteriogram 7. Drug-eluting bead chemoembolization 8. Catheterization of an accessory segment 6 branch of the hepatic artery with arteriogram 9. Drug-eluting bead  chemoembolization MEDICATIONS: Zosyn 3.375 g. The antibiotic was administered within 1 hour of the procedure. Additionally, 4 mg Decadron was administered intravenously. ANESTHESIA/SEDATION: Moderate (conscious) sedation was employed during this procedure. A total of Versed 2 mg and Fentanyl 100 mcg was administered intravenously. Moderate Sedation Time: 85 minutes. The patient's level of consciousness and vital signs were monitored continuously by radiology nursing throughout the procedure under my direct supervision. CONTRAST:  100 mL Isovue 370 FLUOROSCOPY TIME:  Fluoroscopy Time: 27 minutes 54 seconds (1145 mGy). COMPLICATIONS: None immediate. PROCEDURE: Informed consent was obtained from the patient following explanation of the procedure, risks, benefits and alternatives. The patient understands, agrees and consents for the procedure. All questions were addressed. A time out was performed prior to the initiation of the procedure. Maximal barrier sterile technique utilized including caps, mask, sterile gowns, sterile gloves, large sterile drape, hand hygiene, and Betadine prep. The left radial artery was interrogated with ultrasound and found to be widely patent and of adequate diameter. An image was obtained and stored for the medical record. Local anesthesia was attained by infiltration with 1% lidocaine. Under real-time sonographic guidance, the vessel was punctured with a 21 gauge micropuncture needle. Using standard technique, the initial micro needle was exchanged over a 0.021 micro wire for a hydrophilic 5 French slim arterial sheath. The sheath was flushed and then a radial cocktail consisting of 5000 units heparin, 2.5 mg Verapamil and 200 mcg nitroglycerine was injected through the sheath.  An ultimate 1 catheter was advanced over a Bentson wire into the abdominal aorta. The catheter was used to select the celiac axis. A celiac arteriogram was performed. The left gastric artery is hypertrophic. The  common hepatic artery was successfully identified. The catheter was then navigated into the common hepatic artery. An arteriogram was performed. Conventional hepatic arterial anatomy. 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 There appear to be multiple feeding arteries. The catheter was next advanced into the proper hepatic artery and additional arteriography was performed in multiple obliquities. The mass appears to be fed from a combination of a segment 6 and accessory segment 6 hepatic artery. A 150 cm Renegade high flow microcatheter was then advanced coaxially through the 5 French catheter over a Fathom 14 wire. The microcatheter was advanced into the right hepatic artery. Right hepatic arteriography was performed in multiple obliquities identifying the origins of the segment 6 and accessory segment 6 hepatic arteries. The microcatheter was successfully navigated into the segment 6 hepatic artery. Arteriography was performed confirming significant hypervascularity and tumor blush. The dominant segment 6 hepatic artery supplies approximately 60-70% of the overall tumor volume. Chemoembolization was then performed. 1.2 vials of 100-300 micron LC beads loaded with doxorubicin were injected. Overall flow was diminished, but stasis was not achieved. The microcatheter was then flushed and brought back more proximally to access the accessory segment 6 hepatic artery. The accessory segment 6 hepatic artery was successfully catheterized. Contrast injection was performed confirming tumor blush in the other 30% of the tumor volume. Chemoembolization was performed using a 0.8 vials of 100-300 micron LC beads loaded with doxorubicin. The microcatheter was removed. A final contrast injection was performed through the 5 French catheter demonstrating overall decreased tumor blush. The catheter was removed. An additional 2.5 mg verapamil and 200 mcg nitroglycerin were administered through the radial sheath.  The radial sheath was then removed and hemostasis attained with the assistance of a TR band. The patient tolerated the procedure well. IMPRESSION: Technically successful drug-eluting bead chemoembolization. Electronically Signed   By: Jacqulynn Cadet M.D.   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 He presents today for drug-eluting bead chemoembolization. EXAM: Drug-eluting bead chemoembolization 1. Ultrasound-guided access, left radial artery 2. Catheterization of the celiac artery with arteriogram 3. Catheterization of the common hepatic artery with arteriogram 4. Catheterization of the proper hepatic artery with arteriogram 5. Catheterization of the right hepatic artery with arteriogram 6. Catheterization of the segment 6 branch of the hepatic artery with arteriogram 7. Drug-eluting bead chemoembolization 8. Catheterization of an accessory segment 6 branch of the hepatic artery with arteriogram 9. Drug-eluting bead chemoembolization MEDICATIONS: Zosyn 3.375 g. The antibiotic was administered within 1 hour of the procedure. Additionally, 4 mg Decadron was administered intravenously. ANESTHESIA/SEDATION: Moderate (conscious) sedation was employed during this procedure. A total of Versed 2 mg and Fentanyl 100 mcg was administered intravenously. Moderate Sedation Time: 85 minutes. The patient's level of consciousness and vital signs were monitored continuously by radiology nursing throughout the procedure under my direct supervision. CONTRAST:  100 mL Isovue 370 FLUOROSCOPY TIME:  Fluoroscopy Time: 27 minutes 54 seconds (1145 mGy). COMPLICATIONS: None immediate. PROCEDURE: Informed consent was obtained from the patient following explanation of the procedure, risks, benefits and alternatives. The patient understands, agrees and consents for the procedure. All questions were  addressed. A time out was performed prior to  the initiation of the procedure. Maximal barrier sterile technique utilized including caps, mask, sterile gowns, sterile gloves, large sterile drape, hand hygiene, and Betadine prep. The left radial artery was interrogated with ultrasound and found to be widely patent and of adequate diameter. An image was obtained and stored for the medical record. Local anesthesia was attained by infiltration with 1% lidocaine. Under real-time sonographic guidance, the vessel was punctured with a 21 gauge micropuncture needle. Using standard technique, the initial micro needle was exchanged over a 0.021 micro wire for a hydrophilic 5 French slim arterial sheath. The sheath was flushed and then a radial cocktail consisting of 5000 units heparin, 2.5 mg Verapamil and 200 mcg nitroglycerine was injected through the sheath. An ultimate 1 catheter was advanced over a Bentson wire into the abdominal aorta. The catheter was used to select the celiac axis. A celiac arteriogram was performed. The left gastric artery is hypertrophic. The common hepatic artery was successfully identified. The catheter was then navigated into the common hepatic artery. An arteriogram was performed. Conventional hepatic arterial anatomy. 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 There appear to be multiple feeding arteries. The catheter was next advanced into the proper hepatic artery and additional arteriography was performed in multiple obliquities. The mass appears to be fed from a combination of a segment 6 and accessory segment 6 hepatic artery. A 150 cm Renegade high flow microcatheter was then advanced coaxially through the 5 French catheter over a Fathom 14 wire. The microcatheter was advanced into the right hepatic artery. Right hepatic arteriography was performed in multiple obliquities identifying the origins of the segment 6 and accessory segment 6 hepatic arteries. The microcatheter was  successfully navigated into the segment 6 hepatic artery. Arteriography was performed confirming significant hypervascularity and tumor blush. The dominant segment 6 hepatic artery supplies approximately 60-70% of the overall tumor volume. Chemoembolization was then performed. 1.2 vials of 100-300 micron LC beads loaded with doxorubicin were injected. Overall flow was diminished, but stasis was not achieved. The microcatheter was then flushed and brought back more proximally to access the accessory segment 6 hepatic artery. The accessory segment 6 hepatic artery was successfully catheterized. Contrast injection was performed confirming tumor blush in the other 30% of the tumor volume. Chemoembolization was performed using a 0.8 vials of 100-300 micron LC beads loaded with doxorubicin. The microcatheter was removed. A final contrast injection was performed through the 5 French catheter demonstrating overall decreased tumor blush. The catheter was removed. An additional 2.5 mg verapamil and 200 mcg nitroglycerin were administered through the radial sheath. The radial sheath was then removed and hemostasis attained with the assistance of a TR band. The patient tolerated the procedure well. IMPRESSION: Technically successful drug-eluting bead chemoembolization. Electronically Signed   By: Jacqulynn Cadet M.D.   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 He presents today for drug-eluting bead chemoembolization. EXAM: Drug-eluting bead chemoembolization 1. Ultrasound-guided access, left radial artery 2. Catheterization of the celiac artery with arteriogram 3. Catheterization of the common hepatic artery with arteriogram 4. Catheterization of the proper hepatic artery with arteriogram 5. Catheterization of the right hepatic artery with arteriogram 6.  Catheterization of the segment 6 branch of the hepatic artery with arteriogram 7. Drug-eluting bead chemoembolization 8. Catheterization of an accessory segment 6 branch of the hepatic artery with arteriogram 9. Drug-eluting bead chemoembolization  MEDICATIONS: Zosyn 3.375 g. The antibiotic was administered within 1 hour of the procedure. Additionally, 4 mg Decadron was administered intravenously. ANESTHESIA/SEDATION: Moderate (conscious) sedation was employed during this procedure. A total of Versed 2 mg and Fentanyl 100 mcg was administered intravenously. Moderate Sedation Time: 85 minutes. The patient's level of consciousness and vital signs were monitored continuously by radiology nursing throughout the procedure under my direct supervision. CONTRAST:  100 mL Isovue 370 FLUOROSCOPY TIME:  Fluoroscopy Time: 27 minutes 54 seconds (1145 mGy). COMPLICATIONS: None immediate. PROCEDURE: Informed consent was obtained from the patient following explanation of the procedure, risks, benefits and alternatives. The patient understands, agrees and consents for the procedure. All questions were addressed. A time out was performed prior to the initiation of the procedure. Maximal barrier sterile technique utilized including caps, mask, sterile gowns, sterile gloves, large sterile drape, hand hygiene, and Betadine prep. The left radial artery was interrogated with ultrasound and found to be widely patent and of adequate diameter. An image was obtained and stored for the medical record. Local anesthesia was attained by infiltration with 1% lidocaine. Under real-time sonographic guidance, the vessel was punctured with a 21 gauge micropuncture needle. Using standard technique, the initial micro needle was exchanged over a 0.021 micro wire for a hydrophilic 5 French slim arterial sheath. The sheath was flushed and then a radial cocktail consisting of 5000 units heparin, 2.5 mg Verapamil and 200 mcg nitroglycerine was injected  through the sheath. An ultimate 1 catheter was advanced over a Bentson wire into the abdominal aorta. The catheter was used to select the celiac axis. A celiac arteriogram was performed. The left gastric artery is hypertrophic. The common hepatic artery was successfully identified. The catheter was then navigated into the common hepatic artery. An arteriogram was performed. Conventional hepatic arterial anatomy. 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 There appear to be multiple feeding arteries. The catheter was next advanced into the proper hepatic artery and additional arteriography was performed in multiple obliquities. The mass appears to be fed from a combination of a segment 6 and accessory segment 6 hepatic artery. A 150 cm Renegade high flow microcatheter was then advanced coaxially through the 5 French catheter over a Fathom 14 wire. The microcatheter was advanced into the right hepatic artery. Right hepatic arteriography was performed in multiple obliquities identifying the origins of the segment 6 and accessory segment 6 hepatic arteries. The microcatheter was successfully navigated into the segment 6 hepatic artery. Arteriography was performed confirming significant hypervascularity and tumor blush. The dominant segment 6 hepatic artery supplies approximately 60-70% of the overall tumor volume. Chemoembolization was then performed. 1.2 vials of 100-300 micron LC beads loaded with doxorubicin were injected. Overall flow was diminished, but stasis was not achieved. The microcatheter was then flushed and brought back more proximally to access the accessory segment 6 hepatic artery. The accessory segment 6 hepatic artery was successfully catheterized. Contrast injection was performed confirming tumor blush in the other 30% of the tumor volume. Chemoembolization was performed using a 0.8 vials of 100-300 micron LC beads loaded with doxorubicin. The microcatheter was removed. A final  contrast injection was performed through the 5 French catheter demonstrating overall decreased tumor blush. The catheter was removed. An additional 2.5 mg verapamil and 200 mcg nitroglycerin were administered through the radial sheath. The radial sheath was then removed and hemostasis attained with the assistance of a TR band. The patient tolerated the procedure well. IMPRESSION: Technically successful drug-eluting bead chemoembolization. Electronically Signed  By: Jacqulynn Cadet M.D.   On: 07/19/2018 14:53   Ir Radiologist Eval & Mgmt  Result Date: 07/07/2018 Please refer to notes tab for details about interventional procedure. (Op Note)   Treatments: [place] DEB-TACE  Discharge Exam: Blood pressure 123/87, pulse 90, temperature (!) 102 F (38.9 C), temperature source Oral, resp. rate 20, height 5\' 4"  (1.626 m), weight 121 lb 4.1 oz (55 kg), SpO2 99 %. Physical Exam Vitals signs and nursing note reviewed.  Constitutional:      General: He is not in acute distress.    Appearance: Normal appearance.  Pulmonary:     Effort: Pulmonary effort is normal. No respiratory distress.  Abdominal:     Palpations: Abdomen is soft.     Tenderness: There is no abdominal tenderness.  Skin:    General: Skin is warm and dry.  Neurological:     Mental Status: He is alert and oriented to person, place, and time.  Psychiatric:        Mood and Affect: Mood normal.        Behavior: Behavior normal.        Thought Content: Thought content normal.        Judgment: Judgment normal.     Disposition: Discharge disposition: 01-Home or Self Care       Discharge Instructions    Call MD for:  difficulty breathing, headache or visual disturbances   Complete by:  As directed    Call MD for:  extreme fatigue   Complete by:  As directed    Call MD for:  hives   Complete by:  As directed    Call MD for:  persistant dizziness or light-headedness   Complete by:  As directed    Call MD for:  persistant  nausea and vomiting   Complete by:  As directed    Call MD for:  redness, tenderness, or signs of infection (pain, swelling, redness, odor or green/yellow discharge around incision site)   Complete by:  As directed    Call MD for:  severe uncontrolled pain   Complete by:  As directed    Call MD for:  temperature >100.4   Complete by:  As directed    Diet - low sodium heart healthy   Complete by:  As directed    Increase activity slowly   Complete by:  As directed    No wound care   Complete by:  As directed      Allergies as of 07/21/2018   No Known Allergies     Medication List    TAKE these medications   amLODipine 5 MG tablet Commonly known as:  NORVASC Take 1 tablet (5 mg total) by mouth daily.   aspirin EC 81 MG tablet Take 81 mg by mouth every morning. Reported on 12/19/2015   omeprazole 40 MG capsule Commonly known as:  PRILOSEC Take 1 capsule (40 mg total) by mouth daily.   tamsulosin 0.4 MG Caps capsule Commonly known as:  FLOMAX Take 1 capsule (0.4 mg total) by mouth daily.         Electronically Signed: Earley Abide, PA-C 07/21/2018, 10:43 AM   I have spent Less Than 30 Minutes discharging [employer].

## 2018-07-21 NOTE — Discharge Instructions (Signed)
Chemoembolization, Care After °This sheet gives you information about how to care for yourself after your procedure. Your health care provider may also give you more specific instructions. If you have problems or questions, contact your health care provider. °What can I expect after the procedure? °After the procedure, it is common to have: °· Pain, nausea, vomiting, and fever (post-embolization syndrome). If your fever gets worse, tell your health care provider. °· Fatigue. °· Loss of appetite. This should gradually improve after about 1 week. °· Soreness and tenderness in the area where the needle and catheter were placed (puncture site). ° °Follow these instructions at home: °Puncture site care °· Follow instructions from your health care provider about how to take care of the puncture site. Make sure you: °? Wash your hands with soap and water before you change your bandage (dressing). If soap and water are not available, use hand sanitizer. °? Change your dressing as told by your health care provider. °? Leave stitches (sutures), skin glue, or adhesive strips in place. These skin closures may need to stay in place for 2 weeks or longer. If adhesive strip edges start to loosen and curl up, you may trim the loose edges. Do not remove adhesive strips completely unless your health care provider tells you to do that. °· Check your puncture site every day for signs of infection. Check for: °? More redness, swelling, or pain. °? More fluid or blood. °? Warmth. °? Pus or a bad smell. °Activity °· Rest and return to your normal activities as told by your health care provider. Ask your health care provider what activities are safe for you. °· Do not drive for 24 hours after the procedure if you were given a medicine to help you relax (sedative). °· Do not lift anything that is heavier than 10 lb (4.5 kg) until your health care provider says that it is safe. °Medicines °· Take over-the-counter and prescription medicines  only as told by your health care provider. °· Do not drive or use heavy machinery while taking prescription pain medicine. °General instructions ° °· To prevent or treat constipation while you are taking prescription pain medicine, your health care provider may recommend that you: °? Drink enough fluid to keep your urine clear or pale yellow. °? Take over-the-counter or prescription medicines. °? Eat foods that are high in fiber, such as fresh fruits and vegetables, whole grains, and beans. °? Limit foods that are high in fat and processed sugars, such as fried and sweet foods. °· Eat frequent small meals until your appetite returns. Follow instructions from your health care provider about eating or drinking restrictions. °· Do not take baths, swim, or use a hot tub until your health care provider approves. You may take showers. Wash your puncture site with mild soap and water and pat the area dry. °· Wear compression stockings as told by your health care provider. These stockings help to prevent blood clots and reduce swelling in your legs. °· If you were given a small breathing device (incentive spirometer), use it as directed. It helps keep your lungs clear while you are recovering. Use it for as long as directed. °· Keep all follow-up visits as told by your health care provider. This is important. You may need to have blood tests and imaging tests done. °Contact a health care provider if: °· You have more redness, swelling, or pain around your puncture site. °· You have more fluid or blood coming from your puncture site. °·   Your puncture site feels warm to the touch.  You have pus or a bad smell coming from your puncture site.  You have a fever that gets worse or is higher than what your health care provider told you to expect.  You have pain that gets worse or does not get better with medicine.  You have nausea or vomiting that lasts for more than 2 days.  You cannot drink fluids without  vomiting.  You develop a rash. Get help right away if:  You develop any of the following in your legs: ? Pain. ? Swelling. ? Skin that is cold or pale or turns blue.  You develop shortness of breath.  You faint.  You have chest pain.  You have weakness or difficulty moving your arms or legs.  You have changes in your speech or your vision. This information is not intended to replace advice given to you by your health care provider. Make sure you discuss any questions you have with your health care provider. Document Released: 03/18/2011 Document Revised: 04/15/2016 Document Reviewed: 04/15/2016 Elsevier Interactive Patient Education  2018 [place]. Moderate Conscious Sedation, Adult, Care After These instructions provide you with information about caring for yourself after your procedure. Your health care provider may also give you more specific instructions. Your treatment has been planned according to current medical practices, but problems sometimes occur. Call your health care provider if you have any problems or questions after your procedure. What can I expect after the procedure? After your procedure, it is common:  To feel sleepy for several hours.  To feel clumsy and have poor balance for several hours.  To have poor judgment for several hours.  To vomit if you eat too soon.  Follow these instructions at home: For at least 24 hours after the procedure:   Do not: ? Participate in activities where you could fall or become injured. ? Drive. ? Use heavy machinery. ? Drink alcohol. ? Take sleeping pills or medicines that cause drowsiness. ? Make important decisions or sign legal documents. ? Take care of children on your own.  Rest. Eating and drinking  Follow the diet recommended by your health care provider.  If you vomit: ? Drink water, juice, or soup when you can drink without vomiting. ? Make sure you have little or no nausea before eating solid  foods. General instructions  Have a responsible adult stay with you until you are awake and alert.  Take over-the-counter and prescription medicines only as told by your health care provider.  If you smoke, do not smoke without supervision.  Keep all follow-up visits as told by your health care provider. This is important. Contact a health care provider if:  You keep feeling nauseous or you keep vomiting.  You feel light-headed.  You develop a rash.  You have a fever. Get help right away if:  You have trouble breathing. This information is not intended to replace advice given to you by your health care provider. Make sure you discuss any questions you have with your health care provider. Document Released: 05/10/2013 Document Revised: 12/23/2015 Document Reviewed: 11/09/2015 Elsevier Interactive Patient Education  [employer].

## 2018-07-22 LAB — URINE CULTURE: Culture: NO GROWTH

## 2018-07-22 MED FILL — oxyCODONE HCL 5 MG TABS: 5 | 3 days supply | Qty: 15 | Fill #0

## 2018-07-22 MED FILL — CIPROFLOXACIN HCL 500 MG TA: 500 | 7 days supply | Qty: 14 | Fill #0

## 2018-07-22 MED FILL — ONDANSETRON HCL 4 MG TABLET: 4 | 6 days supply | Qty: 20 | Fill #0

## 2018-07-24 ENCOUNTER — Emergency Department (HOSPITAL_COMMUNITY)
Admission: EM | Admit: 2018-07-24 | Discharge: 2018-07-24 | Disposition: A | Discharge status: Home / Self Care | Payer: Medicare Other | Attending: Emergency Medicine | Admitting: Emergency Medicine

## 2018-07-24 ENCOUNTER — Encounter (HOSPITAL_COMMUNITY): Payer: Self-pay | Admitting: *Deleted

## 2018-07-24 ENCOUNTER — Emergency Department (HOSPITAL_COMMUNITY): Payer: Medicare Other

## 2018-07-24 DIAGNOSIS — F17210 Nicotine dependence, cigarettes, uncomplicated: Secondary | ICD-10-CM | POA: Insufficient documentation

## 2018-07-24 DIAGNOSIS — R1011 Right upper quadrant pain: Secondary | ICD-10-CM | POA: Insufficient documentation

## 2018-07-24 DIAGNOSIS — Z8505 Personal history of malignant neoplasm of liver: Secondary | ICD-10-CM | POA: Diagnosis not present

## 2018-07-24 DIAGNOSIS — I10 Essential (primary) hypertension: Secondary | ICD-10-CM | POA: Insufficient documentation

## 2018-07-24 DIAGNOSIS — R509 Fever, unspecified: Secondary | ICD-10-CM | POA: Diagnosis not present

## 2018-07-24 DIAGNOSIS — Z79899 Other long term (current) drug therapy: Secondary | ICD-10-CM | POA: Diagnosis not present

## 2018-07-24 DIAGNOSIS — K5730 Diverticulosis of large intestine without perforation or abscess without bleeding: Secondary | ICD-10-CM | POA: Diagnosis not present

## 2018-07-24 DIAGNOSIS — K828 Other specified diseases of gallbladder: Secondary | ICD-10-CM | POA: Diagnosis not present

## 2018-07-24 DIAGNOSIS — Z7982 Long term (current) use of aspirin: Secondary | ICD-10-CM | POA: Diagnosis not present

## 2018-07-24 DIAGNOSIS — R5381 Other malaise: Secondary | ICD-10-CM | POA: Diagnosis not present

## 2018-07-24 LAB — CBC WITH DIFFERENTIAL/PLATELET
Abs Immature Granulocytes: 0.09 10*3/uL — ABNORMAL HIGH (ref 0.00–0.07)
Basophils Absolute: 0 10*3/uL (ref 0.0–0.1)
Basophils Relative: 0 %
EOS PCT: 0 %
Eosinophils Absolute: 0 10*3/uL (ref 0.0–0.5)
HCT: 31.7 % — ABNORMAL LOW (ref 39.0–52.0)
Hemoglobin: 10.5 g/dL — ABNORMAL LOW (ref 13.0–17.0)
Immature Granulocytes: 1 %
LYMPHS ABS: 1.1 10*3/uL (ref 0.7–4.0)
Lymphocytes Relative: 12 %
MCH: 29.7 pg (ref 26.0–34.0)
MCHC: 33.1 g/dL (ref 30.0–36.0)
MCV: 89.8 fL (ref 80.0–100.0)
Monocytes Absolute: 1.2 10*3/uL — ABNORMAL HIGH (ref 0.1–1.0)
Monocytes Relative: 13 %
Neutro Abs: 6.6 10*3/uL (ref 1.7–7.7)
Neutrophils Relative %: 74 %
Platelets: 255 10*3/uL (ref 150–400)
RBC: 3.53 MIL/uL — ABNORMAL LOW (ref 4.22–5.81)
RDW: 11.9 % (ref 11.5–15.5)
WBC: 9.1 10*3/uL (ref 4.0–10.5)
nRBC: 0 % (ref 0.0–0.2)

## 2018-07-24 LAB — COMPREHENSIVE METABOLIC PANEL
ALT: 93 U/L — AB (ref 0–44)
AST: 56 U/L — AB (ref 15–41)
Albumin: 3.5 g/dL (ref 3.5–5.0)
Alkaline Phosphatase: 57 U/L (ref 38–126)
Anion gap: 10 (ref 5–15)
BUN: 7 mg/dL — ABNORMAL LOW (ref 8–23)
CALCIUM: 8.9 mg/dL (ref 8.9–10.3)
CO2: 22 mmol/L (ref 22–32)
CREATININE: 0.66 mg/dL (ref 0.61–1.24)
Chloride: 96 mmol/L — ABNORMAL LOW (ref 98–111)
GFR calc Af Amer: >60 mL/min (ref >60)
GFR calc non Af Amer: >60 mL/min (ref >60)
Glucose, Bld: 100 mg/dL — ABNORMAL HIGH (ref 70–99)
Potassium: 3.4 mmol/L — ABNORMAL LOW (ref 3.5–5.1)
Sodium: 128 mmol/L — ABNORMAL LOW (ref 135–145)
Total Bilirubin: 0.8 mg/dL (ref 0.3–1.2)
Total Protein: 6.8 g/dL (ref 6.5–8.1)

## 2018-07-24 LAB — URINALYSIS, ROUTINE W REFLEX MICROSCOPIC
Bilirubin Urine: NEGATIVE
Glucose, UA: 50 mg/dL — AB
Hgb urine dipstick: NEGATIVE
Ketones, ur: NEGATIVE mg/dL
Leukocytes, UA: NEGATIVE
Nitrite: NEGATIVE
Protein, ur: NEGATIVE mg/dL
Specific Gravity, Urine: 1.013 (ref 1.005–1.030)
pH: 7 (ref 5.0–8.0)

## 2018-07-24 LAB — LIPASE, BLOOD: Lipase: 66 U/L — ABNORMAL HIGH (ref 11–51)

## 2018-07-24 MED ORDER — TECHNETIUM TC 99M MEBROFENIN IV KIT
5.3000 | PACK | Freq: Once | INTRAVENOUS | Status: AC
  Administered 2018-07-24: 5.3 via INTRAVENOUS

## 2018-07-24 MED ORDER — MORPHINE SULFATE (PF) 2 MG/ML IV SOLN
2.0000 mg | Freq: Once | INTRAVENOUS | Status: AC
  Administered 2018-07-24: 2 mg via INTRAVENOUS
  Filled 2018-07-24: qty 1

## 2018-07-24 MED ORDER — IOPAMIDOL (ISOVUE-300) INJECTION 61%
INTRAVENOUS | Status: AC
  Filled 2018-07-24: qty 100

## 2018-07-24 MED ORDER — SODIUM CHLORIDE (PF) 0.9 % IJ SOLN
INTRAMUSCULAR | Status: AC
  Filled 2018-07-24: qty 50

## 2018-07-24 MED ORDER — IOPAMIDOL (ISOVUE-300) INJECTION 61%
100.0000 mL | Freq: Once | INTRAVENOUS | Status: AC | PRN
  Administered 2018-07-24: 100 mL via INTRAVENOUS

## 2018-07-24 NOTE — ED Provider Notes (Signed)
[facility] DEPT Provider Note   CSN: [id number] Arrival date & time: 07/24/18  1407     History   Chief Complaint Chief Complaint  Patient presents with  . Fever  . Abdominal Pain    HPI Calvin Gardner is a 67 y.o. male.  Patient is a 67 year old male with a history of active EtOH use, alcoholic cirrhosis, hepatitis C and a recently diagnosed 7.4 cm mass in his liver consistent with hepatocellular carcinoma.  He had a IR transcatheter chemo embolization of the liver mass on December 17.  He had spiked a fever in the hospital of 102 that day but he has not had any fevers at home until today he felt febrile and last night.  He was a subjective fever as he does not have a thermometer at home.  He has had some ongoing pain to his abdomen since her procedure but got worse yesterday.  He says it is eased off little bit today.  He has had some intermittent nausea and vomiting but this is fairly common for him.  No change in bowel habits.  No urinary symptoms.  No increased abdominal swelling.  No cough or URI symptoms.     Past Medical History:  Diagnosis Date  . Alcoholism ([place])   . Aortic atherosclerosis ([place])   . Arthritis   . Cancer ([place])    liver  . Cataract   . Hepatitis C   . HLD (hyperlipidemia)   . Hypertension   . Liver mass   . Pulmonary nodule     Patient Active Problem List   Diagnosis Date Noted  . Hepatocellular carcinoma ([place]) 06/09/2018  . Pulmonary nodule 05/25/2018  . GERD (gastroesophageal reflux disease) 01/10/2018  . Hiccough 10/07/2017  . Alcohol abuse 10/07/2017  . Chest pain 10/29/2015  . Liver fibrosis ([place]) 10/23/2014  . Chronic hepatitis C without hepatic coma ([place]) 09/19/2014  . IFG (impaired fasting glucose) 04/02/2014  . Vitamin D deficiency 04/02/2014  . Abnormal LFTs 04/02/2014  . Cataract, right eye 01/05/2014  . Essential hypertension, benign 01/05/2014  . Smoking 01/05/2014    Past Surgical  History:  Procedure Laterality Date  . COLONOSCOPY WITH PROPOFOL N/A 01/30/2014   Procedure: COLONOSCOPY WITH PROPOFOL;  Surgeon: Garlan Fair, MD;  Location: [facility] ENDOSCOPY;  Service: Endoscopy;  Laterality: N/A;  . ESOPHAGOGASTRODUODENOSCOPY (EGD) WITH PROPOFOL N/A 01/30/2014   Procedure: ESOPHAGOGASTRODUODENOSCOPY (EGD) WITH PROPOFOL;  Surgeon: Garlan Fair, MD;  Location: [facility] ENDOSCOPY;  Service: Endoscopy;  Laterality: N/A;  . IR ANGIOGRAM SELECTIVE EACH ADDITIONAL VESSEL  07/19/2018  . IR ANGIOGRAM SELECTIVE EACH ADDITIONAL VESSEL  07/19/2018  . IR ANGIOGRAM SELECTIVE EACH ADDITIONAL VESSEL  07/19/2018  . IR ANGIOGRAM SELECTIVE EACH ADDITIONAL VESSEL  07/19/2018  . IR ANGIOGRAM SELECTIVE EACH ADDITIONAL VESSEL  07/19/2018  . IR ANGIOGRAM VISCERAL SELECTIVE  07/19/2018  . IR EMBO TUMOR ORGAN ISCHEMIA INFARCT INC GUIDE ROADMAPPING  07/19/2018  . IR RADIOLOGIST EVAL & MGMT  07/07/2018  . IR US GUIDE VASC ACCESS LEFT  07/19/2018  . left jaw surgery     . MANDIBLE FRACTURE SURGERY          Home Medications    Prior to Admission medications   Medication Sig Start Date End Date Taking? Authorizing Provider  amLODipine (NORVASC) 5 MG tablet Take 1 tablet (5 mg total) by mouth daily. 05/16/18  Yes Charlott Rakes, MD  aspirin EC 81 MG tablet Take 81 mg by mouth every morning. Reported  on 12/19/2015   Yes [provider]  ciprofloxacin (CIPRO) 500 MG tablet Take 500 mg by mouth 2 (two) times daily.   Yes [provider]  omeprazole (PRILOSEC) 40 MG capsule Take 1 capsule (40 mg total) by mouth daily. 05/31/18  Yes Thornton Park, MD  ondansetron (ZOFRAN) 4 MG tablet Take 4 mg by mouth every 8 (eight) hours as needed for nausea or vomiting.   Yes [provider]  oxyCODONE (ROXICODONE) 5 MG immediate release tablet Take 5 mg by mouth every 6 (six) hours as needed for moderate pain or severe pain (abdominal pain).   Yes [provider]  tamsulosin  (FLOMAX) 0.4 MG CAPS capsule Take 1 capsule (0.4 mg total) by mouth daily. 01/10/18  Yes Charlott Rakes, MD    Family History Family History  Problem Relation Age of Onset  . Cancer Mother        type unknown  . Emphysema Brother   . Colon cancer Neg Hx   . Esophageal cancer Neg Hx   . Pancreatic cancer Neg Hx     Social History Social History   Tobacco Use  . Smoking status: Current Some Day Smoker    Packs/day: 1.00    Years: 50.00    Pack years: 50.00    Types: Cigarettes  . Smokeless tobacco: Never Used  . Tobacco comment: trying to quit, smoke every other day  Substance Use Topics  . Alcohol use: Yes    Alcohol/week: 0.0 standard drinks    Comment: 2-3 40 oz beers per day, x50 years  . Drug use: No    Comment: remote IV drug use age 82      Allergies   Patient has no known allergies.   Review of Systems Review of Systems  Constitutional: Positive for fever. Negative for chills, diaphoresis and fatigue.  HENT: Negative for congestion, rhinorrhea and sneezing.   Eyes: Negative.   Respiratory: Negative for cough, chest tightness and shortness of breath.   Cardiovascular: Negative for chest pain and leg swelling.  Gastrointestinal: Positive for abdominal pain, nausea and vomiting. Negative for blood in stool and diarrhea.  Genitourinary: Negative for difficulty urinating, flank pain, frequency and hematuria.  Musculoskeletal: Negative for arthralgias and back pain.  Skin: Negative for rash.  Neurological: Negative for dizziness, speech difficulty, weakness, numbness and headaches.     Physical Exam Updated Vital Signs BP (!) 154/91 (BP Location: Right Arm)   Pulse 86   Temp 98.5 F (36.9 C) (Oral)   Resp 20   SpO2 96%   Physical Exam Constitutional:      Appearance: He is well-developed.  HENT:     Head: Normocephalic and atraumatic.  Eyes:     Pupils: Pupils are equal, round, and reactive to light.  Neck:     Musculoskeletal: Normal range of  motion and neck supple.  Cardiovascular:     Rate and Rhythm: Normal rate and regular rhythm.     Heart sounds: Normal heart sounds.  Pulmonary:     Effort: Pulmonary effort is normal. No respiratory distress.     Breath sounds: Normal breath sounds. No wheezing or rales.  Chest:     Chest wall: No tenderness.  Abdominal:     General: Bowel sounds are normal.     Palpations: Abdomen is soft.     Tenderness: There is generalized abdominal tenderness. There is no guarding or rebound.  Musculoskeletal: Normal range of motion.  Lymphadenopathy:     Cervical:  No cervical adenopathy.  Skin:    General: Skin is warm and dry.     Findings: No rash.  Neurological:     Mental Status: He is alert and oriented to person, place, and time.      ED Treatments / Results  Labs (all labs ordered are listed, but only abnormal results are displayed) Labs Reviewed  COMPREHENSIVE METABOLIC PANEL - Abnormal; Notable for the following components:      Result Value   Sodium 128 (*)    Potassium 3.4 (*)    Chloride 96 (*)    Glucose, Bld 100 (*)    BUN 7 (*)    AST 56 (*)    ALT 93 (*)    All other components within normal limits  LIPASE, BLOOD - Abnormal; Notable for the following components:   Lipase 66 (*)    All other components within normal limits  CBC WITH DIFFERENTIAL/PLATELET - Abnormal; Notable for the following components:   RBC 3.53 (*)    Hemoglobin 10.5 (*)    HCT 31.7 (*)    Monocytes Absolute 1.2 (*)    Abs Immature Granulocytes 0.09 (*)    All other components within normal limits  URINALYSIS, ROUTINE W REFLEX MICROSCOPIC - Abnormal; Notable for the following components:   Glucose, UA 50 (*)    All other components within normal limits    EKG None  Radiology Nm Hepatobiliary Liver Func  Result Date: 07/24/2018 CLINICAL DATA:  Right upper quadrant pain and findings suggestive of cholecystitis on recent CT examination EXAM: NUCLEAR MEDICINE HEPATOBILIARY IMAGING  TECHNIQUE: Sequential images of the abdomen were obtained out to 60 minutes following intravenous administration of radiopharmaceutical. RADIOPHARMACEUTICALS:  6.15 mCi Tc-60m  Choletec IV COMPARISON:  None. FINDINGS: Prompt uptake is noted throughout the liver immediately following injection of the radiotracer. Visualization of the biliary tree is noted at 5 minutes with subsequent small-bowel activity at 10 minutes as well. Mild uptake is noted within the mass lesion in the inferior aspect of the right lobe of the liver. Imaging to 1 hour and 30 minutes show no significant gallbladder visualization. For this reason 2 mg of morphine was given IV and the patient was reimaged. Following administration of morphine there is evidence of visualization of the gallbladder consistent with cystic duct patency. IMPRESSION: Late visualization of the gallbladder indicating cystic duct patency. This occurred following morphine augmentation. Some of the right upper quadrant pain may be related to the recent intervention. Normal uptake and excretion of biliary tracer. Electronically Signed   By: Inez Catalina M.D.   On: 07/24/2018 22:47   Ct Abdomen Pelvis W Contrast  Result Date: 07/24/2018 CLINICAL DATA:  Fever. Right flank pain. Vomiting. Recent diagnosis of liver cancer. First radiation treatment last week. EXAM: CT ABDOMEN AND PELVIS WITH CONTRAST TECHNIQUE: Multidetector CT imaging of the abdomen and pelvis was performed using the standard protocol following bolus administration of intravenous contrast. CONTRAST:  161mL ISOVUE-300 IOPAMIDOL (ISOVUE-300) INJECTION 61% COMPARISON:  Multiple exams, including 05/31/2018 MRI, and 05/24/2018. FINDINGS: Lower chest: Unremarkable the level of the right middle lobe pulmonary nodule shown on the prior CT abdomen exam was not included on today's exam. Hepatobiliary: 6.8 by 7.2 cm mass inferiorly in the right hepatic lobe on image 30/2 demonstrates a reduced degree of enhancement  compared to the 05/24/2018 CT, and internal complexity, potentially representing some early response to the radiation therapy. The by my measurements the mass is stable in size. There is an abnormal  appearance of the gallbladder which appears to have thick and somewhat irregular walls. No new liver lesions are identified. No new biliary dilatation. Pancreas: Unremarkable Spleen: Unremarkable Adrenals/Urinary Tract: Unremarkable Stomach/Bowel: Descending colon diverticulosis.  Appendix normal. Vascular/Lymphatic: Aortoiliac atherosclerotic vascular disease. No pathologic adenopathy. Reproductive: Moderate prostatomegaly. Central prostate calcifications noted. Other: No supplemental non-categorized findings. Musculoskeletal: Grade 1 degenerative retrolisthesis at L4-5. In conjunction with intervertebral and facet arthropathy there is moderate right and mild left foraminal impingement at this level. 3 mm of grade 1 degenerative anterolisthesis at L3-4. IMPRESSION: 1. Gallbladder wall is thickened with bandlike striations along its margin such that acute cholecystitis is not excluded. If further imaging workup is warranted, nuclear medicine patent biliary scan could be utilized to assess for cystic duct patency. No new biliary dilatation is observed. 2. The inferior right hepatic lobe mass is of similar size to the prior exam, but demonstrates some evolutionary internal findings which are probably a response to recent treatments. No new masses identified. 3. Prostatomegaly. 4. Bilateral foraminal impingement at L4-5. 5.  Aortic Atherosclerosis (ICD10-I70.0). 6. Descending colon diverticulosis. Electronically Signed   By: Van Clines M.D.   On: 07/24/2018 17:10    Procedures Procedures (including critical care time)  Medications Ordered in ED Medications  sodium chloride (PF) 0.9 % injection (0 mLs  Hold 07/24/18 1656)  iopamidol (ISOVUE-300) 61 % injection 100 mL ( Intravenous Hold 07/24/18 1655)    technetium TC 86M mebrofenin (CHOLETEC) injection 5.3 millicurie (5.3 millicuries Intravenous Contrast Given 07/24/18 2005)  morphine 2 MG/ML injection 2 mg (2 mg Intravenous Given 07/24/18 2151)     Initial Impression / Assessment and Plan / ED Course  I have reviewed the triage vital signs and the nursing notes.  Pertinent labs & imaging results that were available during my care of the patient were reviewed by me and considered in my medical decision making (see chart for details).     Patient is a 67 year old male who presents with right eye abdominal pain.  He had a recent procedure done under IR in this area on his liver.  He reports a fever but he has not had a fever in the emergency department.  His white count is normal.  I did do a CT scan which showed some questionable evidence of cholecystitis.  I discussed this with the radiologist who felt this could be related to the recent procedure but he could not definitively rule out cholecystitis and recommended a HIDA scan to assess for adequate biliary emptying/common bile duct patency.  This was performed which showed normal-appearing functioning gallbladder.  His LFTs are mildly elevated but actually better than his prior values.  He does have some mild hyponatremia which can be followed by his oncologist.  He is nontoxic-appearing.  His abdominal exam has some mild tenderness but likely related to his recent procedure.  He was discharged home in good condition.  He was encouraged to have close follow-up with his oncologist.  Return precautions were given.  Final Clinical Impressions(s) / ED Diagnoses   Final diagnoses:  Right upper quadrant abdominal pain    ED Discharge Orders    None       Malvin Johns, MD 07/24/18 2317

## 2018-07-24 NOTE — ED Notes (Signed)
Patient remains in nuclear medicine at this time.

## 2018-07-24 NOTE — ED Notes (Signed)
Patient transported to Nuclear Medicine 

## 2018-07-24 NOTE — ED Triage Notes (Addendum)
Per EMS, pt complains of fever, abdominal pain since last night, vomiting since last night. Pt is newly diagnosed with liver cancer, pt had first radiation treatment last week.   CBG 170 BP 122/70 HR 98 O2 97% RR 16

## 2018-07-24 NOTE — ED Notes (Addendum)
Pt verbalized discharge instructions and follow up care. Friend is picking up pt to her him  to "lady's" house   Domingo Pulse  [phone]

## 2018-07-24 NOTE — ED Notes (Signed)
Bed: WA03 Expected date:  Expected time:  Means of arrival:  Comments: Cancer pt

## 2018-07-28 NOTE — Progress Notes (Signed)
[place]   Telephone:([phone]) [phone] Fax:(336) [phone]   Clinic Follow up Note   Patient Care Team: Charlott Rakes, MD as PCP - General (Family Medicine) 08/09/2018  CHIEF COMPLAINT: F/u on [place]   SUMMARY OF ONCOLOGIC HISTORY: Oncology History   Cancer Staging Hepatocellular carcinoma ([name]) Staging form: Liver, AJCC 8th Edition - Clinical: Stage IIIA (cT3, cN0, cM0) - Unsigned       Hepatocellular carcinoma ([name])   10/05/2014 Imaging    ABD US IMPRESSION: Coarse hepatic echotexture with hyperechoic hepatic parenchyma, suggesting hepatic steatosis and/or mild cirrhosis. Median hepatic shear wave velocity is calculated at 1.54 m/sec. Corresponding Metavir fibrosis score is F2/F3. Risk of fibrosis is moderate. Follow-up:  Additional testing appropriate.    10/14/2017 Imaging    Liver: There is coarse increased echotexture of the liver. There is a 6.2 x 6.1 x 6.2 cm complex echotexture mass in the liver. Portal vein is patent on color Doppler imaging with normal direction of blood flow towards the liver.  IMPRESSION: Mass in the right lobe liver. Further evaluation with three-phase liver CT is recommended.    01/14/2018 Imaging    CT CHEST IMPRESSION: 1. Lung-RADS 2S, benign appearance or behavior. Continue annual screening with low-dose chest CT without contrast in 12 months. 2. The "S" modifier above refers to potentially clinically significant non lung cancer related findings. Specifically, there is aortic atherosclerosis, in addition to left anterior descending coronary artery disease. Please note that although the presence of coronary artery calcium documents the presence of coronary artery disease, the severity of this disease and any potential stenosis cannot be assessed on this non-gated CT examination. Assessment for potential risk factor modification, dietary therapy or pharmacologic therapy may be warranted, if clinically indicated. 3. Mild diffuse  bronchial wall thickening with mild centrilobular and mild-to-moderate paraseptal emphysema; imaging findings suggestive of underlying COPD. 4. Hepatic steatosis.  Aortic Atherosclerosis (ICD10-I70.0) and Emphysema (ICD10-J43.9).     05/16/2018 Tumor Marker    AFP 8.9      05/24/2018 Imaging    CT ABD/LIVER IMPRESSION: 1. There is a large hypervascular mass within segment 5 of the liver. In a patient at high risk for [facility] this is concerning for hepatocellular carcinoma. 2. Morphologic features of the liver suggestive of early cirrhosis. 3. Pulmonary nodule in the right middle lobe is new from 01/14/2018 measuring 6 mm. Non-contrast chest CT at 6-12 months is recommended. If the nodule is stable at time of repeat CT, then future CT at 18-24 months (from today's scan) is considered optional for low-risk patients, but is recommended for high-risk patients. This recommendation follows the consensus statement: Guidelines for Management of Incidental Pulmonary Nodules Detected on CT Images: From the Fleischner Society 2017; Radiology 2017; 284:228-243. 4.  Aortic Atherosclerosis (ICD10-I70.0).     05/31/2018 Imaging    MRI liver findings:  7.4 x 6.8 x 7.1 cm well-circumscribed enhancing mass along the inferior aspect of segment 6 (series 6/image 18). While the enhancement characteristics are poorly evaluated due to motion degradation, an enhancing rim is suspected (series 17/image 41). By definition, this is a LI-RADS 5 lesion given the history of hepatitis C. Suspected signal loss within the lesion on opposed phase imaging (series 7/image 35), which supports the diagnosis of well-differentiated HCC.    06/09/2018 Initial Diagnosis    Hepatocellular carcinoma ([name])     CURRENT THERAPY S/P DEB-TACE ON 07/19/2018    INTERVAL HISTORY: Calvin Gardner is a 67 y.o. male who is here for  follow-up. He recently went to the ER for syncope. Today, he is here alone. He is doing well. He has  abdominal pain when he bends. He is eating well and states that he quit drinking. He is losing weight.  Pertinent positives and negatives of review of systems are listed and detailed within the above HPI.  REVIEW OF SYSTEMS:   Constitutional: Denies fevers, chills (+) weight loss Eyes: Denies blurriness of vision Ears, nose, mouth, throat, and face: Denies mucositis or sore throat Respiratory: Denies cough, dyspnea or wheezes Cardiovascular: Denies palpitation, chest discomfort or lower extremity swelling Gastrointestinal:  Denies nausea, heartburn or change in bowel habits (+) abdominal pain when he bends Skin: Denies abnormal skin rashes Lymphatics: Denies new lymphadenopathy or easy bruising Neurological:Denies numbness, tingling or new weaknesses Behavioral/Psych: Mood is stable, no new changes  All other systems were reviewed with the patient and are negative.  MEDICAL HISTORY:  Past Medical History:  Diagnosis Date  . Alcoholism ([place])   . Aortic atherosclerosis ([place])   . Arthritis   . Cancer ([place])    liver  . Cataract   . Hepatitis C   . HLD (hyperlipidemia)   . Hypertension   . Liver mass   . Pulmonary nodule     SURGICAL HISTORY: Past Surgical History:  Procedure Laterality Date  . COLONOSCOPY WITH PROPOFOL N/A 01/30/2014   Procedure: COLONOSCOPY WITH PROPOFOL;  Surgeon: Garlan Fair, MD;  Location: [facility];  Service: Endoscopy;  Laterality: N/A;  . ESOPHAGOGASTRODUODENOSCOPY (EGD) WITH PROPOFOL N/A 01/30/2014   Procedure: ESOPHAGOGASTRODUODENOSCOPY (EGD) WITH PROPOFOL;  Surgeon: Garlan Fair, MD;  Location: [facility];  Service: Endoscopy;  Laterality: N/A;  . IR ANGIOGRAM SELECTIVE EACH ADDITIONAL VESSEL  07/19/2018  . IR ANGIOGRAM SELECTIVE EACH ADDITIONAL VESSEL  07/19/2018  . IR ANGIOGRAM SELECTIVE EACH ADDITIONAL VESSEL  07/19/2018  . IR ANGIOGRAM SELECTIVE EACH ADDITIONAL VESSEL  07/19/2018  . IR ANGIOGRAM SELECTIVE EACH ADDITIONAL VESSEL   07/19/2018  . IR ANGIOGRAM VISCERAL SELECTIVE  07/19/2018  . IR EMBO TUMOR ORGAN ISCHEMIA INFARCT INC GUIDE ROADMAPPING  07/19/2018  . IR RADIOLOGIST EVAL & MGMT  07/07/2018  . IR US GUIDE VASC ACCESS LEFT  07/19/2018  . left jaw surgery     . MANDIBLE FRACTURE SURGERY      I have reviewed the social history and family history with the patient and they are unchanged from previous note.  ALLERGIES:  has No Known Allergies.  MEDICATIONS:  Current Outpatient Medications  Medication Sig Dispense Refill  . amLODipine (NORVASC) 5 MG tablet Take 1 tablet (5 mg total) by mouth daily. 30 tablet 6  . aspirin EC 81 MG tablet Take 81 mg by mouth every morning. Reported on 12/19/2015    . omeprazole (PRILOSEC) 40 MG capsule Take 1 capsule (40 mg total) by mouth daily. 60 capsule 3  . tamsulosin (FLOMAX) 0.4 MG CAPS capsule Take 1 capsule (0.4 mg total) by mouth daily. 30 capsule 6  . ondansetron (ZOFRAN) 4 MG tablet Take 4 mg by mouth every 8 (eight) hours as needed for nausea or vomiting.    Marland Kitchen oxyCODONE (ROXICODONE) 5 MG immediate release tablet Take 5 mg by mouth every 6 (six) hours as needed for moderate pain or severe pain (abdominal pain).     No current facility-administered medications for this visit.     PHYSICAL EXAMINATION: ECOG PERFORMANCE STATUS: 1 - Symptomatic but completely ambulatory  Vitals:   08/09/18 1319  BP: 123/77  Pulse: 90  Resp: 18  Temp: 98.3 F (36.8 C)  SpO2: 100%   Filed Weights   08/09/18 1319  Weight: 116 lb 3.2 oz (52.7 kg)    GENERAL:alert, no distress and comfortable SKIN: skin color, texture, turgor are normal, no rashes or significant lesions EYES: normal, Conjunctiva are pink and non-injected, sclera clear OROPHARYNX:no exudate, no erythema and lips, buccal mucosa, and tongue normal  NECK: supple, thyroid normal size, non-tender, without nodularity LYMPH:  no palpable lymphadenopathy in the cervical, axillary or inguinal LUNGS: clear to  auscultation and percussion with normal breathing effort HEART: regular rate & rhythm and no murmurs and no lower extremity edema ABDOMEN:abdomen soft, non-tender and normal bowel sounds Musculoskeletal:no cyanosis of digits and no clubbing  NEURO: alert & oriented x 3 with fluent speech, no focal motor/sensory deficits  LABORATORY DATA:  I have reviewed the data as listed CBC Latest Ref Rng & Units 08/09/2018 08/03/2018 07/24/2018  WBC 4.0 - 10.5 K/uL 9.4 8.4 9.1  Hemoglobin 13.0 - 17.0 g/dL 10.4(L) 10.3(L) 10.5(L)  Hematocrit 39.0 - 52.0 % 31.9(L) 31.3(L) 31.7(L)  Platelets 150 - 400 K/uL 452(H) 563(H) 255     CMP Latest Ref Rng & Units 08/09/2018 08/03/2018 07/24/2018  Glucose 70 - 99 mg/dL 128(H) 124(H) 100(H)  BUN 8 - 23 mg/dL <4(L) <5(L) 7(L)  Creatinine 0.61 - 1.24 mg/dL 0.71 0.84 0.66  Sodium 135 - 145 mmol/L 134(L) 131(L) 128(L)  Potassium 3.5 - 5.1 mmol/L 3.9 3.1(L) 3.4(L)  Chloride 98 - 111 mmol/L 98 95(L) 96(L)  CO2 22 - 32 mmol/L 24 25 22   Calcium 8.9 - 10.3 mg/dL 10.1 9.2 8.9  Total Protein 6.5 - 8.1 g/dL 7.4 6.8 6.8  Total Bilirubin 0.3 - 1.2 mg/dL 0.4 0.6 0.8  Alkaline Phos 38 - 126 U/L 80 64 57  AST 15 - 41 U/L 20 27 56(H)  ALT 0 - 44 U/L 13 10 93(H)    Tumor Marker AFP 05/16/2018: 8.9   PROCEDURES  06/13/2018 Upper Endoscopy Findings: - The esophagus was normal. There are no esophageal varices. - The stomach was normal. There is no portal hypertensive gastropathy. There are no gastric varices. - The examined duodenum was normal. Impressions: - Normal esophagus. - Normal stomach. - Normal examined duodenum. -No esophageal or gastric varices. No portal hypertensive gastropathy. - No specimens collected.   RADIOGRAPHIC STUDIES: I have personally reviewed the radiological images as listed and agreed with the findings in the report. No results found.   07/24/2018 CT AP IMPRESSION: 1. Gallbladder wall is thickened with bandlike striations along  its margin such that acute cholecystitis is not excluded. If further imaging workup is warranted, nuclear medicine patent biliary scan could be utilized to assess for cystic duct patency. No new biliary dilatation is observed. 2. The inferior right hepatic lobe mass is of similar size to the prior exam, but demonstrates some evolutionary internal findings which are probably a response to recent treatments. No new masses identified. 3. Prostatomegaly. 4. Bilateral foraminal impingement at L4-5. 5.  Aortic Atherosclerosis (ICD10-I70.0). 6. Descending colon diverticulosis.  ASSESSMENT & PLAN:  FINLEY CHEVEZ is a 67 y.o. male with history of  1. Hepatocellular carcinoma, in segment 6, cT3N0M0, stage IIIA -He had DEB-TACE treatment with Dr. Laurence Ferrari on 07/19/18. He is doing well, but still has abdominal pain when he bends to pick something from the ground.  -He will continue f/u with Dr. Laurence Ferrari, next appointment on 1/14   -Plan to repeat scan in March to evaluate  his response to TACE  -we discussed the role of systemic therapy for [facility] if he progress  -lab reviewed, LFTs WNL, AFP pending  -f/u in 10 months, or sooner if needed   2. Hep C and liver cirrhosis  -f/u with Dr. Tarri Glenn   Plan  -f/u in 10 months with labs   No problem-specific Assessment & Plan notes found for this encounter.   No orders of the defined types were placed in this encounter.  All questions were answered. The patient knows to call the clinic with any problems, questions or concerns. No barriers to learning was detected. I spent 15 minutes counseling the patient face to face. The total time spent in the appointment was 20 minutes and more than 50% was on counseling and review of test results  I, Noor Dweik am acting as scribe for Dr. Truitt Merle.  I have reviewed the above documentation for accuracy and completeness, and I agree with the above.     Truitt Merle, MD 08/09/2018

## 2018-08-03 ENCOUNTER — Emergency Department (HOSPITAL_COMMUNITY)
Admission: EM | Admit: 2018-08-03 | Discharge: 2018-08-03 | Disposition: A | Discharge status: Home / Self Care | Payer: Medicare Other | Attending: Emergency Medicine | Admitting: Emergency Medicine

## 2018-08-03 ENCOUNTER — Emergency Department (HOSPITAL_COMMUNITY): Payer: Medicare Other

## 2018-08-03 ENCOUNTER — Encounter (HOSPITAL_COMMUNITY): Payer: Self-pay

## 2018-08-03 DIAGNOSIS — R52 Pain, unspecified: Secondary | ICD-10-CM | POA: Diagnosis not present

## 2018-08-03 DIAGNOSIS — Z79899 Other long term (current) drug therapy: Secondary | ICD-10-CM | POA: Diagnosis not present

## 2018-08-03 DIAGNOSIS — Z7982 Long term (current) use of aspirin: Secondary | ICD-10-CM | POA: Insufficient documentation

## 2018-08-03 DIAGNOSIS — R55 Syncope and collapse: Secondary | ICD-10-CM | POA: Insufficient documentation

## 2018-08-03 DIAGNOSIS — I10 Essential (primary) hypertension: Secondary | ICD-10-CM | POA: Diagnosis not present

## 2018-08-03 DIAGNOSIS — R1084 Generalized abdominal pain: Secondary | ICD-10-CM | POA: Diagnosis not present

## 2018-08-03 DIAGNOSIS — F17210 Nicotine dependence, cigarettes, uncomplicated: Secondary | ICD-10-CM | POA: Insufficient documentation

## 2018-08-03 DIAGNOSIS — R42 Dizziness and giddiness: Secondary | ICD-10-CM | POA: Diagnosis not present

## 2018-08-03 DIAGNOSIS — R4020 Unspecified coma: Secondary | ICD-10-CM | POA: Diagnosis not present

## 2018-08-03 LAB — CBC WITH DIFFERENTIAL/PLATELET
Abs Immature Granulocytes: 0.05 10*3/uL (ref 0.00–0.07)
Basophils Absolute: 0 10*3/uL (ref 0.0–0.1)
Basophils Relative: 1 %
Eosinophils Absolute: 0.1 10*3/uL (ref 0.0–0.5)
Eosinophils Relative: 1 %
HCT: 31.3 % — ABNORMAL LOW (ref 39.0–52.0)
HEMOGLOBIN: 10.3 g/dL — AB (ref 13.0–17.0)
Immature Granulocytes: 1 %
LYMPHS PCT: 9 %
Lymphs Abs: 0.7 10*3/uL (ref 0.7–4.0)
MCH: 29.3 pg (ref 26.0–34.0)
MCHC: 32.9 g/dL (ref 30.0–36.0)
MCV: 88.9 fL (ref 80.0–100.0)
Monocytes Absolute: 0.8 10*3/uL (ref 0.1–1.0)
Monocytes Relative: 10 %
Neutro Abs: 6.7 10*3/uL (ref 1.7–7.7)
Neutrophils Relative %: 78 %
Platelets: 563 10*3/uL — ABNORMAL HIGH (ref 150–400)
RBC: 3.52 MIL/uL — ABNORMAL LOW (ref 4.22–5.81)
RDW: 12 % (ref 11.5–15.5)
WBC: 8.4 10*3/uL (ref 4.0–10.5)
nRBC: 0 % (ref 0.0–0.2)

## 2018-08-03 LAB — PROTIME-INR
INR: 1.21
Prothrombin Time: 15.2 seconds (ref 11.4–15.2)

## 2018-08-03 LAB — COMPREHENSIVE METABOLIC PANEL
ALT: 10 U/L (ref 0–44)
AST: 27 U/L (ref 15–41)
Albumin: 3.1 g/dL — ABNORMAL LOW (ref 3.5–5.0)
Alkaline Phosphatase: 64 U/L (ref 38–126)
Anion gap: 11 (ref 5–15)
BUN: <5 mg/dL — ABNORMAL LOW (ref 8–23)
CALCIUM: 9.2 mg/dL (ref 8.9–10.3)
CO2: 25 mmol/L (ref 22–32)
Chloride: 95 mmol/L — ABNORMAL LOW (ref 98–111)
Creatinine, Ser: 0.84 mg/dL (ref 0.61–1.24)
GFR calc Af Amer: >60 mL/min (ref >60)
GFR calc non Af Amer: >60 mL/min (ref >60)
Glucose, Bld: 124 mg/dL — ABNORMAL HIGH (ref 70–99)
Potassium: 3.1 mmol/L — ABNORMAL LOW (ref 3.5–5.1)
Sodium: 131 mmol/L — ABNORMAL LOW (ref 135–145)
Total Bilirubin: 0.6 mg/dL (ref 0.3–1.2)
Total Protein: 6.8 g/dL (ref 6.5–8.1)

## 2018-08-03 LAB — APTT: aPTT: 30 seconds (ref 24–36)

## 2018-08-03 LAB — CBG MONITORING, ED: Glucose-Capillary: 112 mg/dL — ABNORMAL HIGH (ref 70–99)

## 2018-08-03 LAB — I-STAT TROPONIN, ED: Troponin i, poc: 0 ng/mL (ref 0.00–0.08)

## 2018-08-03 MED ORDER — SODIUM CHLORIDE 0.9 % IV BOLUS
1000.0000 mL | Freq: Once | INTRAVENOUS | Status: AC
  Administered 2018-08-03: 1000 mL via INTRAVENOUS

## 2018-08-03 MED ORDER — SODIUM CHLORIDE 0.9 % IV SOLN: INTRAVENOUS | Status: DC

## 2018-08-03 NOTE — ED Notes (Signed)
ED Provider at bedside. 

## 2018-08-03 NOTE — ED Triage Notes (Addendum)
Pt from home via [facility], pt was standing at sink doing dishes when he had a syncopal episode that lasted 2-3 minutes per family. On EMS arrival pt alert and oriented x4. Pt able to ambulate to stretcher. Pt endorses RUQ abdominal pain 6/10 related to his liver cancer. Per EMS, pt had syncopal episode in December as well. Pt was started on a new medicine in mid December for his liver cancer. Pt on daily aspirin. Pt denies neck/head/back pain.

## 2018-08-03 NOTE — ED Provider Notes (Addendum)
[facility] EMERGENCY DEPARTMENT Provider Note   CSN: [id number] Arrival date & time: 08/03/18  1945     History   Chief Complaint Chief Complaint  Patient presents with  . Loss of Consciousness    HPI AZAVIER CRESON is a 68 y.o. male with a PMHx of hepatocellular carcinoma s/p IR transcatheter chemo embolization of the liver mass on 07/19/18, HTN, HLD, HepC, alcoholism, and other conditions listed below, who presents to the ED with complaints of syncope that occurred about 1 hour prior to evaluation.  Patient states that he was in his kitchen washing dishes when he started feeling lightheaded and diaphoretic, and lost consciousness.  He does not think that he fell to the ground, he states that his brother was able to catch him before he fell.  He does not believe that he hit his head.  He states that he was out for "maybe a few minutes".  He has not tried anything for it, no known alleviating or aggravating factors.  He denies any vertiginous dizziness.  He states that he also has some RUQ pain which is the same as the pain he has had for a few weeks, related to his liver cancer.  He denies any changes or worsening in that today.  Of note, pt was seen on 07/24/18 for RUQ pain, had a CT abd/pelv that showed findings questionable for cholecystitis, underwent HIDA scan that showed normal functioning gallbladder.   He denies any head injury, headache, vision changes, recent fevers or chills, chest pain, shortness of breath, nausea, vomiting, diarrhea, constipation, dysuria, hematuria, neck or back pain, myalgias, arthralgias, other injuries, numbness, tingling, focal weakness, or any other complaints at this time.  He states that he recently started a new medication for his liver but he is not sure exactly what it was, he thinks it might be an antibiotic but he is not sure.  He does not know the names of these medications.  The history is provided by the patient and medical  records. No language interpreter was used.  Loss of Consciousness   Associated symptoms include abdominal pain (chronic and unchanged), diaphoresis and light-headedness. Pertinent negatives include chest pain, confusion, dizziness, fever, headaches, nausea, vomiting and weakness.    Past Medical History:  Diagnosis Date  . Alcoholism ([place])   . Aortic atherosclerosis ([place])   . Arthritis   . Cancer ([place])    liver  . Cataract   . Hepatitis C   . HLD (hyperlipidemia)   . Hypertension   . Liver mass   . Pulmonary nodule     Patient Active Problem List   Diagnosis Date Noted  . Hepatocellular carcinoma ([place]) 06/09/2018  . Pulmonary nodule 05/25/2018  . GERD (gastroesophageal reflux disease) 01/10/2018  . Hiccough 10/07/2017  . Alcohol abuse 10/07/2017  . Chest pain 10/29/2015  . Liver fibrosis ([place]) 10/23/2014  . Chronic hepatitis C without hepatic coma ([place]) 09/19/2014  . IFG (impaired fasting glucose) 04/02/2014  . Vitamin D deficiency 04/02/2014  . Abnormal LFTs 04/02/2014  . Cataract, right eye 01/05/2014  . Essential hypertension, benign 01/05/2014  . Smoking 01/05/2014    Past Surgical History:  Procedure Laterality Date  . COLONOSCOPY WITH PROPOFOL N/A 01/30/2014   Procedure: COLONOSCOPY WITH PROPOFOL;  Surgeon: Garlan Fair, MD;  Location: [facility];  Service: Endoscopy;  Laterality: N/A;  . ESOPHAGOGASTRODUODENOSCOPY (EGD) WITH PROPOFOL N/A 01/30/2014   Procedure: ESOPHAGOGASTRODUODENOSCOPY (EGD) WITH PROPOFOL;  Surgeon: Garlan Fair, MD;  Location:  [facility];  Service: Endoscopy;  Laterality: N/A;  . IR ANGIOGRAM SELECTIVE EACH ADDITIONAL VESSEL  07/19/2018  . IR ANGIOGRAM SELECTIVE EACH ADDITIONAL VESSEL  07/19/2018  . IR ANGIOGRAM SELECTIVE EACH ADDITIONAL VESSEL  07/19/2018  . IR ANGIOGRAM SELECTIVE EACH ADDITIONAL VESSEL  07/19/2018  . IR ANGIOGRAM SELECTIVE EACH ADDITIONAL VESSEL  07/19/2018  . IR ANGIOGRAM VISCERAL SELECTIVE  07/19/2018  . IR  EMBO TUMOR ORGAN ISCHEMIA INFARCT INC GUIDE ROADMAPPING  07/19/2018  . IR RADIOLOGIST EVAL & MGMT  07/07/2018  . IR US GUIDE VASC ACCESS LEFT  07/19/2018  . left jaw surgery     . MANDIBLE FRACTURE SURGERY          Home Medications    Prior to Admission medications   Medication Sig Start Date End Date Taking? Authorizing Provider  amLODipine (NORVASC) 5 MG tablet Take 1 tablet (5 mg total) by mouth daily. 05/16/18   Charlott Rakes, MD  aspirin EC 81 MG tablet Take 81 mg by mouth every morning. Reported on 12/19/2015    [provider]  ciprofloxacin (CIPRO) 500 MG tablet Take 500 mg by mouth 2 (two) times daily.    [provider]  omeprazole (PRILOSEC) 40 MG capsule Take 1 capsule (40 mg total) by mouth daily. 05/31/18   Thornton Park, MD  ondansetron (ZOFRAN) 4 MG tablet Take 4 mg by mouth every 8 (eight) hours as needed for nausea or vomiting.    [provider]  oxyCODONE (ROXICODONE) 5 MG immediate release tablet Take 5 mg by mouth every 6 (six) hours as needed for moderate pain or severe pain (abdominal pain).    [provider]  tamsulosin (FLOMAX) 0.4 MG CAPS capsule Take 1 capsule (0.4 mg total) by mouth daily. 01/10/18   Charlott Rakes, MD    Family History Family History  Problem Relation Age of Onset  . Cancer Mother        type unknown  . Emphysema Brother   . Colon cancer Neg Hx   . Esophageal cancer Neg Hx   . Pancreatic cancer Neg Hx     Social History Social History   Tobacco Use  . Smoking status: Current Some Day Smoker    Packs/day: 1.00    Years: 50.00    Pack years: 50.00    Types: Cigarettes  . Smokeless tobacco: Never Used  . Tobacco comment: trying to quit, smoke every other day  Substance Use Topics  . Alcohol use: Yes    Alcohol/week: 0.0 standard drinks    Comment: 2-3 40 oz beers per day, x50 years  . Drug use: No    Comment: remote IV drug use age 49      Allergies   Patient has no known  allergies.   Review of Systems Review of Systems  Constitutional: Positive for diaphoresis. Negative for chills and fever.  HENT: Negative for facial swelling (no head inj).   Eyes: Negative for visual disturbance.  Respiratory: Negative for shortness of breath.   Cardiovascular: Positive for syncope. Negative for chest pain.  Gastrointestinal: Positive for abdominal pain (chronic and unchanged). Negative for constipation, diarrhea, nausea and vomiting.  Genitourinary: Negative for dysuria and hematuria.  Musculoskeletal: Negative for arthralgias and myalgias.  Skin: Negative for color change.  Allergic/Immunologic: Positive for immunocompromised state (cancer).  Neurological: Positive for syncope and light-headedness. Negative for dizziness, weakness, numbness and headaches.  Psychiatric/Behavioral: Negative for confusion.   All other systems reviewed and are negative for acute change except  as noted in the HPI.    Physical Exam Updated Vital Signs BP 130/78 (BP Location: Right Arm)   Pulse 83   Temp 98.1 F (36.7 C) (Oral)   Resp 16   Ht 5\' 6"  (1.676 m)   Wt 53.5 kg   SpO2 100%   BMI 19.05 kg/m   Physical Exam Vitals signs and nursing note reviewed.  Constitutional:      General: He is not in acute distress.    Appearance: Normal appearance. He is well-developed. He is not toxic-appearing.     Comments: Afebrile, nontoxic, NAD  HENT:     Head: Normocephalic and atraumatic.  Eyes:     General: Vision grossly intact.        Right eye: No discharge.        Left eye: No discharge.     Extraocular Movements: Extraocular movements intact.     Conjunctiva/sclera: Conjunctivae normal.     Pupils: Pupils are equal, round, and reactive to light.     Comments: PERRL, EOMI, no nystagmus  Neck:     Musculoskeletal: Normal range of motion and neck supple. Normal range of motion. No neck rigidity, spinous process tenderness or muscular tenderness.     Comments: FROM intact  without spinous process TTP, no bony stepoffs or deformities, no paraspinous muscle TTP or muscle spasms. No rigidity or meningeal signs. No bruising or swelling.  Cardiovascular:     Rate and Rhythm: Normal rate and regular rhythm.     Pulses: Normal pulses.     Heart sounds: Normal heart sounds, S1 normal and S2 normal. No murmur. No friction rub. No gallop.   Pulmonary:     Effort: Pulmonary effort is normal. No respiratory distress.     Breath sounds: Normal breath sounds. No decreased breath sounds, wheezing, rhonchi or rales.  Abdominal:     General: Bowel sounds are normal. There is no distension.     Palpations: Abdomen is soft. Abdomen is not rigid.     Tenderness: There is abdominal tenderness in the right upper quadrant. There is no right CVA tenderness, left CVA tenderness, guarding or rebound. Negative signs include Murphy's sign and McBurney's sign.     Comments: Soft, nondistended, +BS throughout, with mild RUQ TTP, no r/g/r, neg murphy's, neg mcburney's, no CVA TTP   Musculoskeletal: Normal range of motion.     Comments: MAE x4 Strength and sensation grossly intact in all extremities Distal pulses intact Gait steady and nonantalgic/nonataxic No spinal TTP, no bony stepoffs or deformities.   Skin:    General: Skin is warm and dry.     Findings: No rash.  Neurological:     General: No focal deficit present.     Mental Status: He is alert and oriented to person, place, and time.     GCS: GCS eye subscore is 4. GCS verbal subscore is 5. GCS motor subscore is 6.     Cranial Nerves: Cranial nerves are intact. No cranial nerve deficit.     Sensory: Sensation is intact. No sensory deficit.     Motor: Motor function is intact.     Coordination: Coordination is intact. Coordination normal.     Gait: Gait normal.     Comments: CN 2-12 grossly intact A&O x4 GCS 15 Sensation and strength intact Gait nonataxic including with tandem walking Coordination with finger-to-nose  WNL Neg pronator drift   Psychiatric:        Mood and Affect: Mood and affect normal.  Behavior: Behavior normal.    Orthostatic VS for the past 24 hrs:  BP- Lying Pulse- Lying BP- Sitting Pulse- Sitting BP- Standing at 0 minutes Pulse- Standing at 0 minutes  08/03/18 2144 126/76 75 120/73 88 109/69 91      ED Treatments / Results  Labs (all labs ordered are listed, but only abnormal results are displayed) Labs Reviewed  CBC WITH DIFFERENTIAL/PLATELET - Abnormal; Notable for the following components:      Result Value   RBC 3.52 (*)    Hemoglobin 10.3 (*)    HCT 31.3 (*)    Platelets 563 (*)    All other components within normal limits  CBG MONITORING, ED - Abnormal; Notable for the following components:   Glucose-Capillary 112 (*)    All other components within normal limits  PROTIME-INR  APTT  COMPREHENSIVE METABOLIC PANEL  URINALYSIS, ROUTINE W REFLEX MICROSCOPIC  I-STAT TROPONIN, ED    EKG EKG Interpretation  Date/Time:  Wednesday August 03 2018 19:59:37 EST Ventricular Rate:  85 PR Interval:    QRS Duration: 78 QT Interval:  361 QTC Calculation: 430 R Axis:   72 Text Interpretation:  Sinus rhythm Anterior infarct, old No significant change since last tracing Confirmed by Deno Etienne [phone]) on 08/03/2018 9:43:14 PM   Radiology Ct Head Wo Contrast  Result Date: 08/03/2018 CLINICAL DATA:  Syncope EXAM: CT HEAD WITHOUT CONTRAST TECHNIQUE: Contiguous axial images were obtained from the base of the skull through the vertex without intravenous contrast. COMPARISON:  None. FINDINGS: Brain: No evidence of acute infarction, hemorrhage, hydrocephalus, extra-axial collection or mass lesion/mass effect. There is chronic diffuse atrophy. Chronic bilateral periventricular white matter small vessel ischemic changes identified. Vascular: No hyperdense vessel is identified. Skull: Normal. Negative for fracture or focal lesion. Sinuses/Orbits: No acute finding. Other: None.  IMPRESSION: No focal acute intracranial abnormality identified. Chronic diffuse atrophy. Chronic bilateral periventricular white matter small vessel ischemic change. Electronically Signed   By: Abelardo Diesel M.D.   On: 08/03/2018 21:13       Procedures Procedures (including critical care time)  Medications Ordered in ED Medications  sodium chloride 0.9 % bolus 1,000 mL (1,000 mLs Intravenous New Bag/Given 08/03/18 2102)    And  0.9 %  sodium chloride infusion (has no administration in time range)     Initial Impression / Assessment and Plan / ED Course  I have reviewed the triage vital signs and the nursing notes.  Pertinent labs & imaging results that were available during my care of the patient were reviewed by me and considered in my medical decision making (see chart for details).     68 y.o. male here with LOC that occurred about an hour prior to arrival. States he felt hot and sweaty and then lost consciousness, didn't fall that he's aware of, caught by family member.  States that he has some RUQ pain which is the same as baseline.  He was recently evaluated for this and had a reassuring work-up including a HIDA scan that showed a functioning gallbladder.  On exam, no focal neuro deficits, gait steady, vital signs stable, mild RUQ pain with negative Murphy sign.  Unclear etiology for his syncope, he was recently started on new medications but he is not sure what they are.  We will get labs, head imaging, and give fluids, and reassess shortly. Discussed case with my attending Dr. Tyrone Nine who agrees with plan.   9:53 PM CBC w/diff with chronic stable anemia and new thrombocytosis.  CMP pending. INR WNL. APTT WNL. Trop neg. EKG without acute findings. CT head neg for acute findings. Orthostatics borderline.  Patient care to be resumed by Dr. Deno Etienne at shift change sign-out. Patient history has been discussed with provider resuming care. Please see their notes for further documentation of  pending results and dispo/care. Pt stable at sign-out and updated on transfer of care.    Final Clinical Impressions(s) / ED Diagnoses   Final diagnoses:  Syncope and collapse    ED Discharge Orders    [address], [place], [place] 08/03/18 2157    Deno Etienne, DO 08/03/18 2213

## 2018-08-03 NOTE — ED Notes (Signed)
Patient verbalizes understanding of discharge instructions. Opportunity for questioning and answers were provided. Armband removed by staff, pt discharged from ED. Pt offered wheel chair to lobby, pt denied, pt ambulatory to lobby to call a cab.

## 2018-08-03 NOTE — Discharge Instructions (Signed)
Return for chest pain, shortness of breath, headache or neck pain or repeat event.  Follow up with your family doctor.

## 2018-08-03 NOTE — ED Notes (Signed)
CBG 112.  

## 2018-08-04 MED FILL — AMLODIPINE BESYLATE 5 MG TA: 5 | 30 days supply | Qty: 30 | Fill #1

## 2018-08-04 MED FILL — TAMSULOSIN HCL 0.4 MG CAP: 0.4 | 30 days supply | Qty: 30 | Fill #5

## 2018-08-08 ENCOUNTER — Other Ambulatory Visit: Payer: Self-pay

## 2018-08-08 ENCOUNTER — Telehealth: Payer: Self-pay

## 2018-08-08 DIAGNOSIS — C220 Liver cell carcinoma: Secondary | ICD-10-CM

## 2018-08-08 NOTE — Telephone Encounter (Signed)
PCP requested PT/INR be added to labs for 1/7 and 1/14, done.

## 2018-08-09 ENCOUNTER — Encounter: Payer: Self-pay | Admitting: Hematology

## 2018-08-09 ENCOUNTER — Telehealth: Payer: Self-pay | Admitting: Hematology

## 2018-08-09 ENCOUNTER — Inpatient Hospital Stay (HOSPITAL_BASED_OUTPATIENT_CLINIC_OR_DEPARTMENT_OTHER): Payer: Medicare Other | Admitting: Hematology

## 2018-08-09 ENCOUNTER — Inpatient Hospital Stay: Payer: Medicare Other | Attending: Nurse Practitioner

## 2018-08-09 VITALS — BP 123/77 | HR 90 | Temp 98.3°F | Resp 18 | Ht 66.0 in | Wt 116.2 lb

## 2018-08-09 DIAGNOSIS — B1920 Unspecified viral hepatitis C without hepatic coma: Secondary | ICD-10-CM | POA: Diagnosis not present

## 2018-08-09 DIAGNOSIS — C220 Liver cell carcinoma: Secondary | ICD-10-CM | POA: Diagnosis not present

## 2018-08-09 DIAGNOSIS — I10 Essential (primary) hypertension: Secondary | ICD-10-CM | POA: Insufficient documentation

## 2018-08-09 DIAGNOSIS — Z79899 Other long term (current) drug therapy: Secondary | ICD-10-CM | POA: Diagnosis not present

## 2018-08-09 DIAGNOSIS — K7460 Unspecified cirrhosis of liver: Secondary | ICD-10-CM

## 2018-08-09 DIAGNOSIS — R109 Unspecified abdominal pain: Secondary | ICD-10-CM

## 2018-08-09 DIAGNOSIS — Z7982 Long term (current) use of aspirin: Secondary | ICD-10-CM | POA: Diagnosis not present

## 2018-08-09 LAB — CMP (CANCER CENTER ONLY)
ALT: 13 U/L (ref 0–44)
AST: 20 U/L (ref 15–41)
Albumin: 3.2 g/dL — ABNORMAL LOW (ref 3.5–5.0)
Alkaline Phosphatase: 80 U/L (ref 38–126)
Anion gap: 12 (ref 5–15)
BILIRUBIN TOTAL: 0.4 mg/dL (ref 0.3–1.2)
BUN: <4 mg/dL — ABNORMAL LOW (ref 8–23)
CO2: 24 mmol/L (ref 22–32)
Calcium: 10.1 mg/dL (ref 8.9–10.3)
Chloride: 98 mmol/L (ref 98–111)
Creatinine: 0.71 mg/dL (ref 0.61–1.24)
GFR, Est AFR Am: >60 mL/min (ref >60)
GFR, Estimated: >60 mL/min (ref >60)
Glucose, Bld: 128 mg/dL — ABNORMAL HIGH (ref 70–99)
Potassium: 3.9 mmol/L (ref 3.5–5.1)
Sodium: 134 mmol/L — ABNORMAL LOW (ref 135–145)
Total Protein: 7.4 g/dL (ref 6.5–8.1)

## 2018-08-09 LAB — CBC WITH DIFFERENTIAL (CANCER CENTER ONLY)
Abs Immature Granulocytes: 0.04 10*3/uL (ref 0.00–0.07)
BASOS ABS: 0 10*3/uL (ref 0.0–0.1)
Basophils Relative: 0 %
Eosinophils Absolute: 0.1 10*3/uL (ref 0.0–0.5)
Eosinophils Relative: 1 %
HCT: 31.9 % — ABNORMAL LOW (ref 39.0–52.0)
Hemoglobin: 10.4 g/dL — ABNORMAL LOW (ref 13.0–17.0)
Immature Granulocytes: 0 %
Lymphocytes Relative: 14 %
Lymphs Abs: 1.3 10*3/uL (ref 0.7–4.0)
MCH: 28.6 pg (ref 26.0–34.0)
MCHC: 32.6 g/dL (ref 30.0–36.0)
MCV: 87.6 fL (ref 80.0–100.0)
Monocytes Absolute: 1 10*3/uL (ref 0.1–1.0)
Monocytes Relative: 11 %
NRBC: 0 % (ref 0.0–0.2)
Neutro Abs: 6.9 10*3/uL (ref 1.7–7.7)
Neutrophils Relative %: 74 %
Platelet Count: 452 10*3/uL — ABNORMAL HIGH (ref 150–400)
RBC: 3.64 MIL/uL — ABNORMAL LOW (ref 4.22–5.81)
RDW: 12.2 % (ref 11.5–15.5)
WBC: 9.4 10*3/uL (ref 4.0–10.5)

## 2018-08-09 LAB — PROTIME-INR
INR: 1.02
Prothrombin Time: 13.3 seconds (ref 11.4–15.2)

## 2018-08-09 NOTE — Telephone Encounter (Signed)
Printed calendar and avs. °

## 2018-08-10 LAB — AFP TUMOR MARKER: AFP, Serum, Tumor Marker: 1.1 ng/mL (ref 0.0–8.3)

## 2018-08-16 ENCOUNTER — Encounter: Payer: Self-pay | Admitting: Radiology

## 2018-08-16 ENCOUNTER — Ambulatory Visit
Admission: RE | Admit: 2018-08-16 | Discharge: 2018-08-16 | Disposition: A | Discharge status: Home / Self Care | Payer: Medicare Other | Source: Ambulatory Visit | Attending: Student | Admitting: Student

## 2018-08-16 DIAGNOSIS — C220 Liver cell carcinoma: Secondary | ICD-10-CM | POA: Diagnosis not present

## 2018-08-16 DIAGNOSIS — Z9889 Other specified postprocedural states: Secondary | ICD-10-CM | POA: Diagnosis not present

## 2018-08-16 DIAGNOSIS — K7030 Alcoholic cirrhosis of liver without ascites: Secondary | ICD-10-CM | POA: Diagnosis not present

## 2018-08-16 HISTORY — PX: IR RADIOLOGIST EVAL & MGMT: IMG5224

## 2018-08-16 NOTE — Progress Notes (Addendum)
Chief Complaint: Patient was seen in follow-up today for  Chief Complaint  Patient presents with  . Follow-up    1 mo follow up DEB-TACE    at the request of Ronney Lion  Referring Physician(s): Stark Klein, MD  History of Present Illness: Calvin Gardner is a 68 y.o. male  with a history of HCV and EtOH cirrhosis complicated by development of a large 7.4 cm partially exophytic mass arising from the inferior aspect of hepatic segment 6.  MR imaging demonstrates Li-RADS 5 imaging features diagnostic of hepatocellular carcinoma.    He was deemed not to be a surgical candidate and therefore underwent liver directed therapy with drug-eluting bead chemoembolization on 07/19/2018.  He presents today for one-month follow-up evaluation.  Following his procedure, he returned to the hospital with right upper quadrant pain, nausea and vomiting approximately 5 days later.  CT imaging demonstrated evidence of post embolization cholecystitis.  He was managed conservatively and has now completely recovered.  He denies further abdominal pain, nausea, vomiting and weight loss.  His only complaint today is a persistent decrease in appetite.  He reports that food tastes funny and he has very little desire to eat although he makes himself eat because he knows he needs the nutrition.  He is supplementing with chocolate Boost/Ensure.  Past Medical History:  Diagnosis Date  . Alcoholism ([place])   . Aortic atherosclerosis ([place])   . Arthritis   . Cancer ([place])    liver  . Cataract   . Hepatitis C   . HLD (hyperlipidemia)   . Hypertension   . Liver mass   . Pulmonary nodule     Past Surgical History:  Procedure Laterality Date  . COLONOSCOPY WITH PROPOFOL N/A 01/30/2014   Procedure: COLONOSCOPY WITH PROPOFOL;  Surgeon: Garlan Fair, MD;  Location: [facility];  Service: Endoscopy;  Laterality: N/A;  . ESOPHAGOGASTRODUODENOSCOPY (EGD) WITH PROPOFOL N/A 01/30/2014   Procedure:  ESOPHAGOGASTRODUODENOSCOPY (EGD) WITH PROPOFOL;  Surgeon: Garlan Fair, MD;  Location: [facility];  Service: Endoscopy;  Laterality: N/A;  . IR ANGIOGRAM SELECTIVE EACH ADDITIONAL VESSEL  07/19/2018  . IR ANGIOGRAM SELECTIVE EACH ADDITIONAL VESSEL  07/19/2018  . IR ANGIOGRAM SELECTIVE EACH ADDITIONAL VESSEL  07/19/2018  . IR ANGIOGRAM SELECTIVE EACH ADDITIONAL VESSEL  07/19/2018  . IR ANGIOGRAM SELECTIVE EACH ADDITIONAL VESSEL  07/19/2018  . IR ANGIOGRAM VISCERAL SELECTIVE  07/19/2018  . IR EMBO TUMOR ORGAN ISCHEMIA INFARCT INC GUIDE ROADMAPPING  07/19/2018  . IR RADIOLOGIST EVAL & MGMT  07/07/2018  . IR US GUIDE VASC ACCESS LEFT  07/19/2018  . left jaw surgery     . MANDIBLE FRACTURE SURGERY      Allergies: Patient has no known allergies.  Medications: Prior to Admission medications   Medication Sig Start Date End Date Taking? Authorizing Provider  amLODipine (NORVASC) 5 MG tablet Take 1 tablet (5 mg total) by mouth daily. 05/16/18   Charlott Rakes, MD  aspirin EC 81 MG tablet Take 81 mg by mouth every morning. Reported on 12/19/2015    [provider]  omeprazole (PRILOSEC) 40 MG capsule Take 1 capsule (40 mg total) by mouth daily. 05/31/18   Thornton Park, MD  ondansetron (ZOFRAN) 4 MG tablet Take 4 mg by mouth every 8 (eight) hours as needed for nausea or vomiting.    [provider]  oxyCODONE (ROXICODONE) 5 MG immediate release tablet Take 5 mg by mouth every 6 (six) hours as needed for moderate pain or  severe pain (abdominal pain).    [provider]  tamsulosin (FLOMAX) 0.4 MG CAPS capsule Take 1 capsule (0.4 mg total) by mouth daily. 01/10/18   Charlott Rakes, MD     Family History  Problem Relation Age of Onset  . Cancer Mother        type unknown  . Emphysema Brother   . Colon cancer Neg Hx   . Esophageal cancer Neg Hx   . Pancreatic cancer Neg Hx     Social History   Socioeconomic History  . Marital status: Single    Spouse  name: Not on file  . Number of children: 1  . Years of education: Not on file  . Highest education level: Not on file  Occupational History  . Occupation: works in Public relations account executive  . Financial resource strain: Not on file  . Food insecurity:    Worry: Not on file    Inability: Not on file  . Transportation needs:    Medical: Not on file    Non-medical: Not on file  Tobacco Use  . Smoking status: Current Some Day Smoker    Packs/day: 1.00    Years: 50.00    Pack years: 50.00    Types: Cigarettes  . Smokeless tobacco: Never Used  . Tobacco comment: trying to quit, smoke every other day  Substance and Sexual Activity  . Alcohol use: Yes    Alcohol/week: 0.0 standard drinks    Comment: 2-3 40 oz beers per day, x50 years  . Drug use: No    Comment: remote IV drug use age 75   . Sexual activity: Yes    Partners: Female, Male    Comment: patient declines  Lifestyle  . Physical activity:    Days per week: Not on file    Minutes per session: Not on file  . Stress: Not on file  Relationships  . Social connections:    Talks on phone: Not on file    Gets together: Not on file    Attends religious service: Not on file    Active member of club or organization: Not on file    Attends meetings of clubs or organizations: Not on file    Relationship status: Not on file  Other Topics Concern  . Not on file  Social History Narrative  . Not on file    ECOG Status: 1 - Symptomatic but completely ambulatory  Review of Systems: A 12 point ROS discussed and pertinent positives are indicated in the HPI above.  All other systems are negative.  Review of Systems  Vital Signs: BP 104/71   Pulse 96   Temp 98.4 F (36.9 C) (Oral)   Resp 15   Ht 5\' 6"  (1.676 m)   Wt 53.5 kg   SpO2 99%   BMI 19.05 kg/m   Physical Exam Vitals signs and nursing note reviewed.  Constitutional:      Appearance: Normal appearance.  HENT:     Head: Normocephalic and atraumatic.  Eyes:      General: No scleral icterus. Cardiovascular:     Rate and Rhythm: Normal rate.  Pulmonary:     Effort: Pulmonary effort is normal.  Abdominal:     General: Abdomen is flat. There is no distension.     Palpations: Abdomen is soft. There is no mass.     Tenderness: There is no abdominal tenderness.  Skin:    General: Skin is warm and dry.  Neurological:  Mental Status: He is alert and oriented to person, place, and time.  Psychiatric:        Mood and Affect: Mood normal.        Behavior: Behavior normal.       Labs:  CBC: Recent Labs    07/21/18 0908 07/24/18 1555 08/03/18 2101 08/09/18 1216  WBC 15.4* 9.1 8.4 9.4  HGB 11.7* 10.5* 10.3* 10.4*  HCT 35.6* 31.7* 31.3* 31.9*  PLT 213 255 563* 452*    COAGS: Recent Labs    05/31/18 0941 07/19/18 0924 08/03/18 2101 08/09/18 1216  INR 1.0 0.97 1.21 1.02  APTT  --   --  30  --     BMP: Recent Labs    07/20/18 0400 07/24/18 1555 08/03/18 2101 08/09/18 1216  NA 132* 128* 131* 134*  K 4.2 3.4* 3.1* 3.9  CL 102 96* 95* 98  CO2 19* 22 25 24   GLUCOSE 114* 100* 124* 128*  BUN 6* 7* <5* <4*  CALCIUM 9.5 8.9 9.2 10.1  CREATININE 0.69 0.66 0.84 0.71  GFRNONAA >60 >60 >60 >60  GFRAA >60 >60 >60 >60    LIVER FUNCTION TESTS: Recent Labs    07/20/18 0400 07/24/18 1555 08/03/18 2101 08/09/18 1216  BILITOT 0.5 0.8 0.6 0.4  AST 124* 56* 27 20  ALT 76* 93* 10 13  ALKPHOS 39 57 64 80  PROT 7.3 6.8 6.8 7.4  ALBUMIN 3.9 3.5 3.1* 3.2*    TUMOR MARKERS: No results for input(s): AFPTM, CEA, CA199, CHROMGRNA in the last 8760 hours.  Assessment and Plan:  Mr. Lant is now doing quite well 1 month status post drug-eluting bead chemoembolization of his large hepatocellular carcinoma.  The first week following the procedure, he did have some fairly significant right upper quadrant pain and malaise which was likely secondary to post embolization cholecystitis.  I believe 1 of the feeding arteries to his tumor also  provided supply to the gallbladder and likely sustained some collateral embolization.  He has now completely recovered and feels quite good other than decreased appetite.  1.)  Follow-up MRI of the abdomen with gadolinium contrast in 1 month with an accompanying clinic visit.  If his mass has significantly decreased in size, we may be able to proceed with definitive percutaneous ablation.  If the lesion remains outside the size range for ablation, then we will continue surveillance and repeat chemoembolization when needed.   Electronically Signed: Jacqulynn Cadet 08/16/2018, 10:32 AM   I spent a total of  15 Minutes in face to face in clinical consultation, greater than 50% of which was counseling/coordinating care for [facility] status post DEB-TACE.

## 2018-08-23 ENCOUNTER — Other Ambulatory Visit: Payer: Self-pay | Admitting: Interventional Radiology

## 2018-08-24 ENCOUNTER — Other Ambulatory Visit: Payer: Self-pay | Admitting: Interventional Radiology

## 2018-08-24 DIAGNOSIS — C220 Liver cell carcinoma: Secondary | ICD-10-CM

## 2018-08-31 ENCOUNTER — Encounter: Payer: Self-pay | Admitting: Family Medicine

## 2018-08-31 ENCOUNTER — Ambulatory Visit: Payer: Medicare Other | Attending: Family Medicine | Admitting: Family Medicine

## 2018-08-31 VITALS — BP 134/77 | HR 91 | Temp 98.3°F | Ht 66.0 in | Wt 120.4 lb

## 2018-08-31 DIAGNOSIS — F17200 Nicotine dependence, unspecified, uncomplicated: Secondary | ICD-10-CM | POA: Diagnosis not present

## 2018-08-31 DIAGNOSIS — E785 Hyperlipidemia, unspecified: Secondary | ICD-10-CM | POA: Insufficient documentation

## 2018-08-31 DIAGNOSIS — Z79899 Other long term (current) drug therapy: Secondary | ICD-10-CM | POA: Insufficient documentation

## 2018-08-31 DIAGNOSIS — R399 Unspecified symptoms and signs involving the genitourinary system: Secondary | ICD-10-CM | POA: Diagnosis not present

## 2018-08-31 DIAGNOSIS — Z23 Encounter for immunization: Secondary | ICD-10-CM | POA: Insufficient documentation

## 2018-08-31 DIAGNOSIS — B1920 Unspecified viral hepatitis C without hepatic coma: Secondary | ICD-10-CM | POA: Insufficient documentation

## 2018-08-31 DIAGNOSIS — K219 Gastro-esophageal reflux disease without esophagitis: Secondary | ICD-10-CM | POA: Insufficient documentation

## 2018-08-31 DIAGNOSIS — C220 Liver cell carcinoma: Secondary | ICD-10-CM | POA: Diagnosis not present

## 2018-08-31 DIAGNOSIS — Z7982 Long term (current) use of aspirin: Secondary | ICD-10-CM | POA: Diagnosis not present

## 2018-08-31 DIAGNOSIS — I10 Essential (primary) hypertension: Secondary | ICD-10-CM

## 2018-08-31 DIAGNOSIS — F17210 Nicotine dependence, cigarettes, uncomplicated: Secondary | ICD-10-CM | POA: Diagnosis not present

## 2018-08-31 DIAGNOSIS — R066 Hiccough: Secondary | ICD-10-CM | POA: Diagnosis not present

## 2018-08-31 MED ORDER — AMLODIPINE BESYLATE 5 MG PO TABS: 5.0000 mg | ORAL_TABLET | Freq: Every day | ORAL | 6 refills | Status: DC

## 2018-08-31 MED ORDER — TAMSULOSIN HCL 0.4 MG PO CAPS: 0.4000 mg | ORAL_CAPSULE | Freq: Every day | ORAL | 6 refills | Status: DC

## 2018-08-31 MED ORDER — PNEUMOCOCCAL 13-VAL CONJ VACC IM SUSP: 0.5000 mL | INTRAMUSCULAR | 0 refills | Status: AC

## 2018-08-31 MED FILL — AMLODIPINE BESYLATE 5 MG TA: 5 | 30 days supply | Qty: 30 | Fill #0

## 2018-08-31 MED FILL — TAMSULOSIN HCL 0.4 MG CAP: 0.4 | 30 days supply | Qty: 30 | Fill #0

## 2018-09-01 ENCOUNTER — Encounter: Payer: Self-pay | Admitting: Family Medicine

## 2018-09-01 NOTE — Progress Notes (Signed)
Subjective:  Patient ID: Calvin Gardner, male    DOB: 1950-10-14  Age: 68 y.o. MRN: [id number]  CC: Hospitalization Follow-up   HPI Calvin Gardner  Is a 68 year old male with a history of hypertension, GERD, tobacco and diffuse alcohol abuse, diagnosed with hepatocellular carcinoma (stage IIIa cT3,cN0,cM0) in the fall of last year s/p IR transcatheter chemo embolization of the liver mass in 07/2018, status post who presents today for follow-up visit. He had a follow-up visit with his oncologist-Dr. Burr Medico 3 weeks ago and reports doing well. He has quit alcohol but continues to smoke about 2 to 3 cigarettes/day. He complains of frequent hiccups which have been uncontrolled on various medications in the past including Thorazine. Doing well on his antihypertensive and denies chest pains or shortness of breath. Received flu shot today.  Past Medical History:  Diagnosis Date  . Alcoholism ([place])   . Aortic atherosclerosis ([place])   . Arthritis   . Cancer ([place])    liver  . Cataract   . Hepatitis C   . HLD (hyperlipidemia)   . Hypertension   . Liver mass   . Pulmonary nodule     Past Surgical History:  Procedure Laterality Date  . COLONOSCOPY WITH PROPOFOL N/A 01/30/2014   Procedure: COLONOSCOPY WITH PROPOFOL;  Surgeon: Garlan Fair, MD;  Location: [facility];  Service: Endoscopy;  Laterality: N/A;  . ESOPHAGOGASTRODUODENOSCOPY (EGD) WITH PROPOFOL N/A 01/30/2014   Procedure: ESOPHAGOGASTRODUODENOSCOPY (EGD) WITH PROPOFOL;  Surgeon: Garlan Fair, MD;  [facility];  Service: Endoscopy;  Laterality: N/A;  . IR ANGIOGRAM SELECTIVE EACH ADDITIONAL VESSEL  07/19/2018  . IR ANGIOGRAM SELECTIVE EACH ADDITIONAL VESSEL  07/19/2018  . IR ANGIOGRAM SELECTIVE EACH ADDITIONAL VESSEL  07/19/2018  . IR ANGIOGRAM SELECTIVE EACH ADDITIONAL VESSEL  07/19/2018  . IR ANGIOGRAM SELECTIVE EACH ADDITIONAL VESSEL  07/19/2018  . IR ANGIOGRAM VISCERAL SELECTIVE  07/19/2018  . IR EMBO  TUMOR ORGAN ISCHEMIA INFARCT INC GUIDE ROADMAPPING  07/19/2018  . IR RADIOLOGIST EVAL & MGMT  07/07/2018  . IR RADIOLOGIST EVAL & MGMT  08/16/2018  . IR US GUIDE VASC ACCESS LEFT  07/19/2018  . left jaw surgery     . MANDIBLE FRACTURE SURGERY      No Known Allergies   Outpatient Medications Prior to Visit  Medication Sig Dispense Refill  . aspirin EC 81 MG tablet Take 81 mg by mouth every morning. Reported on 12/19/2015    . omeprazole (PRILOSEC) 40 MG capsule Take 1 capsule (40 mg total) by mouth daily. 60 capsule 3  . amLODipine (NORVASC) 5 MG tablet Take 1 tablet (5 mg total) by mouth daily. 30 tablet 6  . tamsulosin (FLOMAX) 0.4 MG CAPS capsule Take 1 capsule (0.4 mg total) by mouth daily. 30 capsule 6  . ondansetron (ZOFRAN) 4 MG tablet Take 4 mg by mouth every 8 (eight) hours as needed for nausea or vomiting.    Marland Kitchen oxyCODONE (ROXICODONE) 5 MG immediate release tablet Take 5 mg by mouth every 6 (six) hours as needed for moderate pain or severe pain (abdominal pain).     No facility-administered medications prior to visit.     ROS Review of Systems  Constitutional: Negative for activity change and appetite change.  HENT: Negative for sinus pressure and sore throat.   Eyes: Negative for visual disturbance.  Respiratory: Negative for cough, chest tightness and shortness of breath.   Cardiovascular: Negative for chest pain and leg swelling.  Gastrointestinal: Negative for abdominal  distention, abdominal pain, constipation and diarrhea.  Endocrine: Negative.   Genitourinary: Negative for dysuria.  Musculoskeletal: Negative for joint swelling and myalgias.  Skin: Negative for rash.  Allergic/Immunologic: Negative.   Neurological: Negative for weakness, light-headedness and numbness.  Psychiatric/Behavioral: Negative for dysphoric mood and suicidal ideas.    Objective:  BP 134/77   Pulse 91   Temp 98.3 F (36.8 C) (Oral)   Ht 5\' 6"  (1.676 m)   Wt 120 lb 6.4 oz (54.6 kg)    SpO2 100%   BMI 19.43 kg/m   BP/Weight 08/31/2018 3/00/7622 01/03/3353  Systolic BP 562 563 893  Diastolic BP 77 71 77  Wt. (Lbs) 120.4 118 116.2  BMI 19.43 19.05 18.76      Physical Exam Constitutional:      Appearance: He is well-developed.  Cardiovascular:     Rate and Rhythm: Normal rate.     Heart sounds: Normal heart sounds. No murmur.  Pulmonary:     Effort: Pulmonary effort is normal.     Breath sounds: Normal breath sounds. No wheezing or rales.  Chest:     Chest wall: No tenderness.  Abdominal:     General: Bowel sounds are normal. There is no distension.     Palpations: Abdomen is soft. There is no mass.     Tenderness: There is no abdominal tenderness.  Musculoskeletal: Normal range of motion.  Neurological:     Mental Status: He is alert and oriented to person, place, and time.  Psychiatric:        Mood and Affect: Mood normal.        Behavior: Behavior normal.      Assessment & Plan:   1. Lower urinary tract symptoms Stable - tamsulosin (FLOMAX) 0.4 MG CAPS capsule; Take 1 capsule (0.4 mg total) by mouth daily.  Dispense: 30 capsule; Refill: 6  2. Essential hypertension, benign Controlled - amLODipine (NORVASC) 5 MG tablet; Take 1 tablet (5 mg total) by mouth daily.  Dispense: 30 tablet; Refill: 6  3. Hepatocellular carcinoma ([place]) Status post DEB- TACE in 07/19/18 Follow up with oncology  4. Hiccough Likely secondary to hepatocellular carcinoma  5. Has been smoking tobacco for 30 years or more - CT CHEST LUNG CA SCREEN LOW DOSE W/O CM; Future  6. Need for immunization against influenza - Flu Vaccine QUAD 36+ mos IM   Meds ordered this encounter  Medications  . tamsulosin (FLOMAX) 0.4 MG CAPS capsule    Sig: Take 1 capsule (0.4 mg total) by mouth daily.    Dispense:  30 capsule    Refill:  6  . amLODipine (NORVASC) 5 MG tablet    Sig: Take 1 tablet (5 mg total) by mouth daily.    Dispense:  30 tablet    Refill:  6  . pneumococcal  13-valent conjugate vaccine (PREVNAR 13) SUSP injection    Sig: Inject 0.5 mLs into the muscle tomorrow at 10 am for 1 dose.    Dispense:  0.5 mL    Refill:  0    Follow-up: Return in about 6 months (around 03/01/2019) for Follow-up of chronic medical conditions.   Charlott Rakes MD

## 2018-09-07 ENCOUNTER — Ambulatory Visit (HOSPITAL_COMMUNITY)
Admission: RE | Admit: 2018-09-07 | Discharge: 2018-09-07 | Disposition: A | Discharge status: Home / Self Care | Payer: Medicare Other | Source: Ambulatory Visit | Attending: Family Medicine | Admitting: Family Medicine

## 2018-09-07 ENCOUNTER — Other Ambulatory Visit: Payer: Self-pay | Admitting: Family Medicine

## 2018-09-07 DIAGNOSIS — F17200 Nicotine dependence, unspecified, uncomplicated: Secondary | ICD-10-CM | POA: Diagnosis not present

## 2018-09-07 DIAGNOSIS — I700 Atherosclerosis of aorta: Secondary | ICD-10-CM | POA: Insufficient documentation

## 2018-09-07 DIAGNOSIS — J439 Emphysema, unspecified: Secondary | ICD-10-CM | POA: Insufficient documentation

## 2018-09-07 DIAGNOSIS — I2510 Atherosclerotic heart disease of native coronary artery without angina pectoris: Secondary | ICD-10-CM | POA: Insufficient documentation

## 2018-09-07 DIAGNOSIS — F17210 Nicotine dependence, cigarettes, uncomplicated: Secondary | ICD-10-CM | POA: Diagnosis not present

## 2018-09-09 ENCOUNTER — Telehealth: Payer: Self-pay

## 2018-09-09 NOTE — Telephone Encounter (Signed)
Patient was called and a voicemail was left informing patient to return phone call for ct results.

## 2018-09-09 NOTE — Telephone Encounter (Signed)
-----   Message from Charlott Rakes, MD sent at 09/08/2018  2:07 PM EST ----- CT chest reveals emphysema, no lung mass

## 2018-09-21 ENCOUNTER — Other Ambulatory Visit: Payer: Self-pay | Admitting: Radiology

## 2018-09-21 ENCOUNTER — Encounter: Payer: Self-pay | Admitting: Radiology

## 2018-09-21 DIAGNOSIS — C220 Liver cell carcinoma: Secondary | ICD-10-CM

## 2018-10-11 MED FILL — AMLODIPINE BESYLATE 5 MG TA: 5 | 30 days supply | Qty: 30 | Fill #1

## 2018-10-11 MED FILL — OMEPRAZOLE DR 40 MG CAPSULE: 40 | 90 days supply | Qty: 90 | Fill #1

## 2018-10-11 MED FILL — TAMSULOSIN HCL 0.4 MG CAP: 0.4 | 30 days supply | Qty: 30 | Fill #1

## 2018-10-12 MED FILL — DICLOFENAC SODIUM 1% GEL: 1 | 12 days supply | Qty: 100 | Fill #4

## 2018-10-18 ENCOUNTER — Telehealth: Payer: Self-pay | Admitting: Interventional Radiology

## 2018-10-18 ENCOUNTER — Ambulatory Visit: Payer: Medicare Other

## 2018-10-18 ENCOUNTER — Ambulatory Visit (HOSPITAL_COMMUNITY)
Admission: RE | Admit: 2018-10-18 | Discharge: 2018-10-18 | Disposition: A | Discharge status: Home / Self Care | Payer: Medicare Other | Source: Ambulatory Visit | Attending: Interventional Radiology | Admitting: Interventional Radiology

## 2018-10-18 ENCOUNTER — Other Ambulatory Visit: Payer: Self-pay

## 2018-10-18 DIAGNOSIS — Z9889 Other specified postprocedural states: Secondary | ICD-10-CM | POA: Diagnosis not present

## 2018-10-18 DIAGNOSIS — C220 Liver cell carcinoma: Secondary | ICD-10-CM | POA: Diagnosis not present

## 2018-10-18 DIAGNOSIS — F17210 Nicotine dependence, cigarettes, uncomplicated: Secondary | ICD-10-CM | POA: Diagnosis not present

## 2018-10-18 DIAGNOSIS — R160 Hepatomegaly, not elsewhere classified: Secondary | ICD-10-CM | POA: Diagnosis not present

## 2018-10-18 LAB — POCT I-STAT CREATININE: Creatinine, Ser: 0.7 mg/dL (ref 0.61–1.24)

## 2018-10-18 MED ORDER — GADOBUTROL 1 MMOL/ML IV SOLN
7.0000 mL | Freq: Once | INTRAVENOUS | Status: AC | PRN
  Administered 2018-10-18: 5 mL via INTRAVENOUS

## 2018-10-18 NOTE — Telephone Encounter (Signed)
Chief Complaint: Patient was evaluated via telephone today for  Chief Complaint  Patient presents with   Hepatic Cancer    Referring Physician(s): Dr. Stark Klein  History of Present Illness: Calvin Gardner is a 68 y.o. male  with a history of HCV and EtOH cirrhosis complicated by development of a large 7.4 cm partially exophytic mass arising from the inferior aspect of hepatic segment 6. MR imaging demonstrates Li-RADS 5imagingfeatures diagnostic of hepatocellular carcinoma.   He was deemed not to be a surgical candidate and therefore underwent liver directed therapy with drug-eluting bead chemoembolization on 07/19/2018.    Calvin Gardner is doing well and is completely recovered following his chemoembolization procedure.  He has no active complaints at this time and denies abdominal pain, fever, chills, nausea, vomiting or other new or systemic symptoms.  He was very pleased to hear the results of his MRI.  Past Medical History:  Diagnosis Date   Alcoholism ([place])    Aortic atherosclerosis ([place])    Arthritis    Cancer ([place])    liver   Cataract    Hepatitis C    HLD (hyperlipidemia)    Hypertension    Liver mass    Pulmonary nodule     Past Surgical History:  Procedure Laterality Date   COLONOSCOPY WITH PROPOFOL N/A 01/30/2014   Procedure: COLONOSCOPY WITH PROPOFOL;  Surgeon: Garlan Fair, MD;  [facility];  Service: Endoscopy;  Laterality: N/A;   ESOPHAGOGASTRODUODENOSCOPY (EGD) WITH PROPOFOL N/A 01/30/2014   Procedure: ESOPHAGOGASTRODUODENOSCOPY (EGD) WITH PROPOFOL;  Surgeon: Garlan Fair, MD;  [facility];  Service: Endoscopy;  Laterality: N/A;   IR ANGIOGRAM SELECTIVE EACH ADDITIONAL VESSEL  07/19/2018   IR ANGIOGRAM SELECTIVE EACH ADDITIONAL VESSEL  07/19/2018   IR ANGIOGRAM SELECTIVE EACH ADDITIONAL VESSEL  07/19/2018   IR ANGIOGRAM SELECTIVE EACH ADDITIONAL VESSEL  07/19/2018   IR ANGIOGRAM SELECTIVE EACH  ADDITIONAL VESSEL  07/19/2018   IR ANGIOGRAM VISCERAL SELECTIVE  07/19/2018   IR EMBO TUMOR ORGAN ISCHEMIA INFARCT INC GUIDE ROADMAPPING  07/19/2018   IR RADIOLOGIST EVAL & MGMT  07/07/2018   IR RADIOLOGIST EVAL & MGMT  08/16/2018   IR US GUIDE VASC ACCESS LEFT  07/19/2018   left jaw surgery      MANDIBLE FRACTURE SURGERY      Allergies: Patient has no known allergies.  Medications: Prior to Admission medications   Medication Sig Start Date End Date Taking? Authorizing Provider  amLODipine (NORVASC) 5 MG tablet Take 1 tablet (5 mg total) by mouth daily. 08/31/18   Charlott Rakes, MD  aspirin EC 81 MG tablet Take 81 mg by mouth every morning. Reported on 12/19/2015    [provider]  omeprazole (PRILOSEC) 40 MG capsule Take 1 capsule (40 mg total) by mouth daily. 05/31/18   Thornton Park, MD  ondansetron (ZOFRAN) 4 MG tablet Take 4 mg by mouth every 8 (eight) hours as needed for nausea or vomiting.    [provider]  oxyCODONE (ROXICODONE) 5 MG immediate release tablet Take 5 mg by mouth every 6 (six) hours as needed for moderate pain or severe pain (abdominal pain).    [provider]  tamsulosin (FLOMAX) 0.4 MG CAPS capsule Take 1 capsule (0.4 mg total) by mouth daily. 08/31/18   Charlott Rakes, MD     Family History  Problem Relation Age of Onset   Cancer Mother        type unknown   Emphysema Brother  Colon cancer Neg Hx    Esophageal cancer Neg Hx    Pancreatic cancer Neg Hx     Social History   Socioeconomic History   Marital status: Single    Spouse name: Not on file   Number of children: 1   Years of education: Not on file   Highest education level: Not on file  Occupational History   Occupation: works in [place]: Not on file   Food insecurity:    Worry: Not on file    Inability: Not on file   Transportation needs:    Medical: Not on file    Non-medical: Not on file   Tobacco Use   Smoking status: Current Some Day Smoker    Packs/day: 1.00    Years: 50.00    Pack years: 50.00    Types: Cigarettes   Smokeless tobacco: Never Used   Tobacco comment: trying to quit, smoke every other day  Substance and Sexual Activity   Alcohol use: Yes    Alcohol/week: 0.0 standard drinks    Comment: 2-3 40 oz beers per day, x50 years   Drug use: No    Comment: remote IV drug use age 31    Sexual activity: Yes    Partners: Female, Male    Comment: patient declines  Lifestyle   Physical activity:    Days per week: Not on file    Minutes per session: Not on file   Stress: Not on file  Relationships   Social connections:    Talks on phone: Not on file    Gets together: Not on file    Attends religious service: Not on file    Active member of club or organization: Not on file    Attends meetings of clubs or organizations: Not on file    Relationship status: Not on file  Other Topics Concern   Not on file  Social History Narrative   Not on file    ECOG Status: 1 - Symptomatic but completely ambulatory  Review of Systems: A 12 point ROS discussed and pertinent positives are indicated in the HPI above.  All other systems are negative.    Imaging: Mr Abdomen Wwo Contrast  Result Date: 10/18/2018 CLINICAL DATA:  Status post tumor embolization of large right hepatic lobe HCC. EXAM: MRI ABDOMEN WITHOUT AND WITH CONTRAST TECHNIQUE: Multiplanar multisequence MR imaging of the abdomen was performed both before and after the administration of intravenous contrast. CONTRAST:  5 cc Gadavist COMPARISON:  CT scan 07/24/2018 and MRI 05/31/2018 FINDINGS: Lower chest: The lung bases are grossly clear. No pleural or pericardial effusion. No obvious pulmonary lesions. Hepatobiliary: Status post ablation of a large right hepatic lobe HCC. The lesion measures approximately 6.4 x 6.1 cm and previously measured 7 x 7 cm. Increased areas of T1 signal intensity in the  lesion likely represent hemorrhage. No obvious internal enhancement. There is mild nodular enhancement involving the capsule. This could be enhancing granulation tissue. I do not see any new early arterial phase enhancing lesions to suggest other lesions are metastasis. No intrahepatic biliary dilatation. The gallbladder is moderately contracted. Pancreas:  No mass, inflammation or ductal dilatation. Spleen:  Normal size.  No focal lesions. Adrenals/Urinary Tract: Adrenal glands and kidneys are unremarkable and stable. Stomach/Bowel: Visualized portions within the abdomen are unremarkable. Vascular/Lymphatic: No pathologically enlarged lymph nodes identified. No abdominal aortic aneurysm demonstrated. Age advanced atherosclerotic calcifications involving the aorta. Other:  No  ascites or abdominal wall hernia. Musculoskeletal: No significant bony findings. IMPRESSION: 1. Slight interval decrease in size of the right hepatic lobe mass. Internal hemorrhage but no worrisome internal enhancement. Areas of slight nodular capsular enhancement is indeterminate and may represent enhancing granulation tissue. Recommend continued surveillance. 2. No new hepatic lesions or adenopathy. Electronically Signed   By: Marijo Sanes M.D.   On: 10/18/2018 10:11    Labs:  CBC: Recent Labs    07/21/18 0908 07/24/18 1555 08/03/18 2101 08/09/18 1216  WBC 15.4* 9.1 8.4 9.4  HGB 11.7* 10.5* 10.3* 10.4*  HCT 35.6* 31.7* 31.3* 31.9*  PLT 213 255 563* 452*    COAGS: Recent Labs    05/31/18 0941 07/19/18 0924 08/03/18 2101 08/09/18 1216  INR 1.0 0.97 1.21 1.02  APTT  --   --  30  --     BMP: Recent Labs    07/20/18 0400 07/24/18 1555 08/03/18 2101 08/09/18 1216 10/18/18 0902  NA 132* 128* 131* 134*  --   K 4.2 3.4* 3.1* 3.9  --   CL 102 96* 95* 98  --   CO2 19* 22 25 24   --   GLUCOSE 114* 100* 124* 128*  --   BUN 6* 7* <5* <4*  --   CALCIUM 9.5 8.9 9.2 10.1  --   CREATININE 0.69 0.66 0.84 0.71 0.70    GFRNONAA >60 >60 >60 >60  --   GFRAA >60 >60 >60 >60  --     LIVER FUNCTION TESTS: Recent Labs    07/20/18 0400 07/24/18 1555 08/03/18 2101 08/09/18 1216  BILITOT 0.5 0.8 0.6 0.4  AST 124* 56* 27 20  ALT 76* 93* 10 13  ALKPHOS 39 57 64 80  PROT 7.3 6.8 6.8 7.4  ALBUMIN 3.9 3.5 3.1* 3.2*    TUMOR MARKERS: No results for input(s): AFPTM, CEA, CA199, CHROMGRNA in the last 8760 hours.  Assessment and Plan:  Calvin Gardner continues to do well 3 months status post drug-eluting bead chemoembolization of his segment 6 hepatocellular carcinoma.  Follow-up MRI imaging today demonstrates a slight interval decrease in size of the lesion as well as essentially complete devascularization.  There is no convincing evidence of residual enhancing tissue although there may be a suggestion of granulation tissue around the margins of the tumor.  Overall, he has had an excellent interval response to therapy.  We will continue surveillance and reserve retreatment for when there is evidence of recurrent or new disease.  1.)  Follow-up gadolinium enhanced MRI in 3 months with accompanying clinic visit and liver labs.    Electronically Signed: Jacqulynn Cadet 10/18/2018, 4:45 PM   I spent a total of  15 Minutes in face to face in clinical consultation, greater than 50% of which was counseling/coordinating care for hepatocellular cancer

## 2018-12-01 MED FILL — AMLODIPINE BESYLATE 5 MG TA: 5 | 30 days supply | Qty: 30 | Fill #2

## 2018-12-01 MED FILL — TAMSULOSIN HCL 0.4 MG CAP: 0.4 | 30 days supply | Qty: 30 | Fill #2

## 2019-01-03 MED FILL — OMEPRAZOLE DR 40 MG CAPSULE: 40 | 60 days supply | Qty: 60 | Fill #2

## 2019-01-03 MED FILL — AMLODIPINE BESYLATE 5 MG TA: 5 | 30 days supply | Qty: 30 | Fill #3

## 2019-01-03 MED FILL — TAMSULOSIN HCL 0.4 MG CAP: 0.4 | 30 days supply | Qty: 30 | Fill #3

## 2019-01-03 MED FILL — DICLOFENAC SODIUM 1% GEL: 1 | 12 days supply | Qty: 100 | Fill #5

## 2019-01-17 ENCOUNTER — Other Ambulatory Visit: Payer: Self-pay | Admitting: *Deleted

## 2019-01-17 ENCOUNTER — Other Ambulatory Visit: Payer: Self-pay | Admitting: Interventional Radiology

## 2019-01-17 DIAGNOSIS — C220 Liver cell carcinoma: Secondary | ICD-10-CM

## 2019-01-18 DIAGNOSIS — C220 Liver cell carcinoma: Secondary | ICD-10-CM | POA: Diagnosis not present

## 2019-01-19 LAB — COMPLETE METABOLIC PANEL WITH GFR
AG Ratio: 1.4 (calc) (ref 1.0–2.5)
ALT: 7 U/L — ABNORMAL LOW (ref 9–46)
AST: 23 U/L (ref 10–35)
Albumin: 4.3 g/dL (ref 3.6–5.1)
Alkaline phosphatase (APISO): 60 U/L (ref 35–144)
BUN: 9 mg/dL (ref 7–25)
CO2: 24 mmol/L (ref 20–32)
Calcium: 10.6 mg/dL — ABNORMAL HIGH (ref 8.6–10.3)
Chloride: 103 mmol/L (ref 98–110)
Creat: 0.8 mg/dL (ref 0.70–1.25)
GFR, Est African American: 108 mL/min/{1.73_m2} (ref >=60)
GFR, Est Non African American: 93 mL/min/{1.73_m2} (ref >=60)
Globulin: 3.1 g/dL (calc) (ref 1.9–3.7)
Glucose, Bld: 162 mg/dL — ABNORMAL HIGH (ref 65–139)
Potassium: 4.7 mmol/L (ref 3.5–5.3)
Sodium: 136 mmol/L (ref 135–146)
Total Bilirubin: 0.4 mg/dL (ref 0.2–1.2)
Total Protein: 7.4 g/dL (ref 6.1–8.1)

## 2019-01-19 LAB — PROTIME-INR
INR: 1
Prothrombin Time: 10 s (ref 9.0–11.5)

## 2019-01-19 LAB — CBC
HCT: 44.5 % (ref 38.5–50.0)
Hemoglobin: 14.4 g/dL (ref 13.2–17.1)
MCH: 28.2 pg (ref 27.0–33.0)
MCHC: 32.4 g/dL (ref 32.0–36.0)
MCV: 87.1 fL (ref 80.0–100.0)
MPV: 9.4 fL (ref 7.5–12.5)
Platelets: 282 10*3/uL (ref 140–400)
RBC: 5.11 10*6/uL (ref 4.20–5.80)
RDW: 13.1 % (ref 11.0–15.0)
WBC: 8 10*3/uL (ref 3.8–10.8)

## 2019-01-19 LAB — AFP TUMOR MARKER: AFP-Tumor Marker: 1.6 ng/mL (ref <6.1)

## 2019-01-26 ENCOUNTER — Ambulatory Visit (HOSPITAL_COMMUNITY)
Admission: RE | Admit: 2019-01-26 | Discharge: 2019-01-26 | Disposition: A | Discharge status: Home / Self Care | Payer: Medicare Other | Source: Ambulatory Visit | Attending: Interventional Radiology | Admitting: Interventional Radiology

## 2019-01-26 ENCOUNTER — Other Ambulatory Visit: Payer: Self-pay

## 2019-01-26 DIAGNOSIS — K5730 Diverticulosis of large intestine without perforation or abscess without bleeding: Secondary | ICD-10-CM | POA: Diagnosis not present

## 2019-01-26 DIAGNOSIS — C220 Liver cell carcinoma: Secondary | ICD-10-CM | POA: Insufficient documentation

## 2019-01-26 DIAGNOSIS — K7689 Other specified diseases of liver: Secondary | ICD-10-CM | POA: Diagnosis not present

## 2019-01-26 DIAGNOSIS — Z8505 Personal history of malignant neoplasm of liver: Secondary | ICD-10-CM | POA: Diagnosis not present

## 2019-01-26 MED ORDER — GADOBUTROL 1 MMOL/ML IV SOLN
5.0000 mL | Freq: Once | INTRAVENOUS | Status: AC | PRN
  Administered 2019-01-26: 5 mL via INTRAVENOUS

## 2019-01-31 ENCOUNTER — Encounter: Payer: Self-pay | Admitting: *Deleted

## 2019-01-31 ENCOUNTER — Ambulatory Visit
Admission: RE | Admit: 2019-01-31 | Discharge: 2019-01-31 | Disposition: A | Discharge status: Home / Self Care | Payer: Medicare Other | Source: Ambulatory Visit | Attending: Interventional Radiology | Admitting: Interventional Radiology

## 2019-01-31 DIAGNOSIS — K7030 Alcoholic cirrhosis of liver without ascites: Secondary | ICD-10-CM | POA: Diagnosis not present

## 2019-01-31 DIAGNOSIS — C220 Liver cell carcinoma: Secondary | ICD-10-CM

## 2019-01-31 HISTORY — PX: IR RADIOLOGIST EVAL & MGMT: IMG5224

## 2019-01-31 NOTE — Progress Notes (Signed)
Chief Complaint: Patient was contacted remotely today (TeleHealth) for follow-up of hepatocellular carcinoma.  This visit type was conducted due to national recommendations for restrictions regarding the COVID-19 Pandemic (e.g. social distancing).  This format is felt to be most appropriate for this patient at this time.  All issues noted in this document were discussed and addressed.    Referring Physician(s): Dr.Byerly  History of Present Illness: Calvin Gardner is a 68 y.o. male with past medical history of HCV and EtOH cirrhosis who developed a large 7.4 cm liver mass concerning for hepatocellular carcinoma.  He underwent DEB-TACE chemoembolization of his tumor 07/19/2018.  The procedure was complicated by post-procedure development of embolization cholecystitis.  He was last seen in follow-up 10/17/17 where he reported complete recovery without ongoing symptoms or concerns.  Follow-up today for ongoing evaluation as well as to discuss recent imaging.   Patient states he has been doing well since his last follow-up.  He appetite has returned and he is eating and drinking well.  He does admit to ongoing alcohol use; 2-3 cans of beer daily.  He is otherwise without complaint.  Denies nausea, vomiting, abdominal pain, skin discoloration, fever, chills, cough, shortness of breath. He denies concerns related to his procedure sites and reports they have healed well. He is interested in the results of his MRI.    Past Medical History:  Diagnosis Date   Alcoholism ([place])    Aortic atherosclerosis ([place])    Arthritis    Cancer ([place])    liver   Cataract    Hepatitis C    HLD (hyperlipidemia)    Hypertension    Liver mass    Pulmonary nodule     Past Surgical History:  Procedure Laterality Date   COLONOSCOPY WITH PROPOFOL N/A 01/30/2014   Procedure: COLONOSCOPY WITH PROPOFOL;  Surgeon: Garlan Fair, MD;  [facility];  Service: Endoscopy;  Laterality: N/A;     ESOPHAGOGASTRODUODENOSCOPY (EGD) WITH PROPOFOL N/A 01/30/2014   Procedure: ESOPHAGOGASTRODUODENOSCOPY (EGD) WITH PROPOFOL;  Surgeon: Garlan Fair, MD;  [facility];  Service: Endoscopy;  Laterality: N/A;   IR ANGIOGRAM SELECTIVE EACH ADDITIONAL VESSEL  07/19/2018   IR ANGIOGRAM SELECTIVE EACH ADDITIONAL VESSEL  07/19/2018   IR ANGIOGRAM SELECTIVE EACH ADDITIONAL VESSEL  07/19/2018   IR ANGIOGRAM SELECTIVE EACH ADDITIONAL VESSEL  07/19/2018   IR ANGIOGRAM SELECTIVE EACH ADDITIONAL VESSEL  07/19/2018   IR ANGIOGRAM VISCERAL SELECTIVE  07/19/2018   IR EMBO TUMOR ORGAN ISCHEMIA INFARCT INC GUIDE ROADMAPPING  07/19/2018   IR RADIOLOGIST EVAL & MGMT  07/07/2018   IR RADIOLOGIST EVAL & MGMT  08/16/2018   IR US GUIDE VASC ACCESS LEFT  07/19/2018   left jaw surgery      MANDIBLE FRACTURE SURGERY      Allergies: Patient has no known allergies.  Medications: Prior to Admission medications   Medication Sig Start Date End Date Taking? Authorizing Provider  amLODipine (NORVASC) 5 MG tablet Take 1 tablet (5 mg total) by mouth daily. 08/31/18   Charlott Rakes, MD  aspirin EC 81 MG tablet Take 81 mg by mouth every morning. Reported on 12/19/2015    [provider]  omeprazole (PRILOSEC) 40 MG capsule Take 1 capsule (40 mg total) by mouth daily. 05/31/18   Thornton Park, MD  ondansetron (ZOFRAN) 4 MG tablet Take 4 mg by mouth every 8 (eight) hours as needed for nausea or vomiting.    [provider]  oxyCODONE (ROXICODONE) 5 MG  immediate release tablet Take 5 mg by mouth every 6 (six) hours as needed for moderate pain or severe pain (abdominal pain).    [provider]  tamsulosin (FLOMAX) 0.4 MG CAPS capsule Take 1 capsule (0.4 mg total) by mouth daily. 08/31/18   Charlott Rakes, MD     Physical Exam No direct physical exam was performed (except for noted visual exam findings with Video Visits when able).  Patient denies concerns or issues at  puncture sites.  States only a scar remains.  Denies abdominal pain, tenderness, or distention.   Vital Signs: There were no vitals taken for this visit.  Imaging: Mr Abdomen Wwo Contrast  Result Date: 01/26/2019 CLINICAL DATA:  68 year old male with history of hepatocellular carcinoma status post embolization procedure. Follow-up study. EXAM: MRI ABDOMEN WITHOUT AND WITH CONTRAST TECHNIQUE: Multiplanar multisequence MR imaging of the abdomen was performed both before and after the administration of intravenous contrast. CONTRAST:  5 mL of Gadavist. COMPARISON:  Abdominal MRI 10/18/2018. FINDINGS: Lower chest: Unremarkable. Hepatobiliary: In the inferior aspect of the right lobe of the liver centered predominantly in segment 5 there is a well-defined 5.7 x 5.0 x 5.3 cm lesion (axial image 64 of series 903 and coronal image 54 of series 10) which is heterogeneous in signal intensity on T1 and T2 weighted images and demonstrates central hypo vascularity with thick rind of low level enhancement which is most evident on delayed imaging. No definite hypervascular arterial phase enhancement associated with this lesion. No other new hepatic lesions. No intra or extrahepatic biliary ductal dilatation. Gallbladder is contracted. Pancreas: No pancreatic mass. No pancreatic ductal dilatation. No pancreatic or peripancreatic fluid or inflammatory changes. Spleen:  Unremarkable. Adrenals/Urinary Tract: Bilateral kidneys and adrenal glands are normal in appearance. No hydroureteronephrosis in the visualized portions of the abdomen. Stomach/Bowel: Colonic diverticulosis noted in the visualized portions of the otherwise, unremarkable. Vascular/Lymphatic: Aortic atherosclerosis without definite aneurysm or dissection in the abdominal vasculature. No lymphadenopathy noted in the abdomen. Other: No significant volume of ascites noted in the visualized portions of the abdomen. Musculoskeletal: No aggressive appearing osseous  lesions are noted in the visualized portions of the skeleton. IMPRESSION: 1. Continued decreased size of ablated lesion in the right lobe of the liver which currently measures 5.7 x 5.0 x 5.3 cm. Surrounding low-level progressive delayed enhancement associated with this lesion is again favored to represent granulation tissue. No new lesions are identified. Electronically Signed   By: Vinnie Langton M.D.   On: 01/26/2019 10:00    Labs:  CBC: Recent Labs    07/24/18 1555 08/03/18 2101 08/09/18 1216 01/18/19 0852  WBC 9.1 8.4 9.4 8.0  HGB 10.5* 10.3* 10.4* 14.4  HCT 31.7* 31.3* 31.9* 44.5  PLT 255 563* 452* 282    COAGS: Recent Labs    07/19/18 0924 08/03/18 2101 08/09/18 1216 01/18/19 0852  INR 0.97 1.21 1.02 1.0  APTT  --  30  --   --     BMP: Recent Labs    07/24/18 1555 08/03/18 2101 08/09/18 1216 10/18/18 0902 01/18/19 0852  NA 128* 131* 134*  --  136  K 3.4* 3.1* 3.9  --  4.7  CL 96* 95* 98  --  103  CO2 22 25 24   --  24  GLUCOSE 100* 124* 128*  --  162*  BUN 7* <5* <4*  --  9  CALCIUM 8.9 9.2 10.1  --  10.6*  CREATININE 0.66 0.84 0.71 0.70 0.80  GFRNONAA >60 >  60 >60  --  93  GFRAA >60 >60 >60  --  108    LIVER FUNCTION TESTS: Recent Labs    07/20/18 0400 07/24/18 1555 08/03/18 2101 08/09/18 1216 01/18/19 0852  BILITOT 0.5 0.8 0.6 0.4 0.4  AST 124* 56* 27 20 23   ALT 76* 93* 10 13 7*  ALKPHOS 39 57 64 80  --   PROT 7.3 6.8 6.8 7.4 7.4  ALBUMIN 3.9 3.5 3.1* 3.2*  --     TUMOR MARKERS: Recent Labs    01/18/19 0852  AFPTM 1.6    Assessment and Plan: Hepatocellular carcinoma s/p DEB-TACE chemoembolization 07/20/18. Mr. Jeschke continues to recover well from his procedure.  He has been able to return to his usual PO intake and denies any residual effects from his suspected embolization cholecystitis.  His most recent lab work shows stability; T bili 0.4, AST 23, ALT 7.  He does continue to drink alcohol daily.   MRI obtained 01/26/19 is  available for review today.  Patient has discussed the results with Dr. Laurence Ferrari.   Plan was made for follow-up MRI of the abdomen in 3 months. Schedulers will arrange and contact patient with date and time of appointment.   Please refer to Dr. Laurence Ferrari attestation of this note for management at plan.

## 2019-02-09 MED FILL — TAMSULOSIN HCL 0.4 MG CAP: 0.4 | 30 days supply | Qty: 30 | Fill #4

## 2019-02-09 MED FILL — AMLODIPINE BESYLATE 5 MG TA: 5 | 30 days supply | Qty: 30 | Fill #4

## 2019-04-03 MED FILL — TAMSULOSIN HCL 0.4 MG CAP: 0.4 | 30 days supply | Qty: 30 | Fill #5

## 2019-04-03 MED FILL — AMLODIPINE BESYLATE 5 MG TA: 5 | 30 days supply | Qty: 30 | Fill #5

## 2019-04-11 ENCOUNTER — Other Ambulatory Visit: Payer: Self-pay | Admitting: *Deleted

## 2019-04-11 ENCOUNTER — Other Ambulatory Visit: Payer: Self-pay | Admitting: Interventional Radiology

## 2019-04-11 DIAGNOSIS — C220 Liver cell carcinoma: Secondary | ICD-10-CM

## 2019-04-17 DIAGNOSIS — C220 Liver cell carcinoma: Secondary | ICD-10-CM | POA: Diagnosis not present

## 2019-04-18 LAB — COMPLETE METABOLIC PANEL WITH GFR
AG Ratio: 1.4 (calc) (ref 1.0–2.5)
ALT: 19 U/L (ref 9–46)
AST: 25 U/L (ref 10–35)
Albumin: 4.4 g/dL (ref 3.6–5.1)
Alkaline phosphatase (APISO): 82 U/L (ref 35–144)
BUN: 11 mg/dL (ref 7–25)
CO2: 26 mmol/L (ref 20–32)
Calcium: 10.1 mg/dL (ref 8.6–10.3)
Chloride: 105 mmol/L (ref 98–110)
Creat: 0.98 mg/dL (ref 0.70–1.25)
GFR, Est African American: 93 mL/min/{1.73_m2} (ref >=60)
GFR, Est Non African American: 80 mL/min/{1.73_m2} (ref >=60)
Globulin: 3.1 g/dL (calc) (ref 1.9–3.7)
Glucose, Bld: 53 mg/dL — ABNORMAL LOW (ref 65–99)
Potassium: 4.8 mmol/L (ref 3.5–5.3)
Sodium: 140 mmol/L (ref 135–146)
Total Bilirubin: 0.2 mg/dL (ref 0.2–1.2)
Total Protein: 7.5 g/dL (ref 6.1–8.1)

## 2019-04-18 LAB — CBC
HCT: 44.5 % (ref 38.5–50.0)
Hemoglobin: 14.4 g/dL (ref 13.2–17.1)
MCH: 28.3 pg (ref 27.0–33.0)
MCHC: 32.4 g/dL (ref 32.0–36.0)
MCV: 87.6 fL (ref 80.0–100.0)
MPV: 9.1 fL (ref 7.5–12.5)
Platelets: 312 10*3/uL (ref 140–400)
RBC: 5.08 10*6/uL (ref 4.20–5.80)
RDW: 12.1 % (ref 11.0–15.0)
WBC: 8.8 10*3/uL (ref 3.8–10.8)

## 2019-04-18 LAB — AFP TUMOR MARKER: AFP-Tumor Marker: 1.7 ng/mL (ref <6.1)

## 2019-04-18 LAB — PROTIME-INR
INR: 1
Prothrombin Time: 10.2 s (ref 9.0–11.5)

## 2019-04-20 ENCOUNTER — Ambulatory Visit (HOSPITAL_COMMUNITY)
Admission: RE | Admit: 2019-04-20 | Discharge: 2019-04-20 | Disposition: A | Discharge status: Home / Self Care | Payer: Medicare Other | Source: Ambulatory Visit | Attending: Interventional Radiology | Admitting: Interventional Radiology

## 2019-04-20 ENCOUNTER — Other Ambulatory Visit: Payer: Self-pay

## 2019-04-20 DIAGNOSIS — C220 Liver cell carcinoma: Secondary | ICD-10-CM | POA: Insufficient documentation

## 2019-04-20 MED ORDER — GADOBUTROL 1 MMOL/ML IV SOLN
5.0000 mL | Freq: Once | INTRAVENOUS | Status: AC | PRN
  Administered 2019-04-20: 5 mL via INTRAVENOUS

## 2019-04-25 ENCOUNTER — Encounter: Payer: Self-pay | Admitting: *Deleted

## 2019-04-25 ENCOUNTER — Other Ambulatory Visit: Payer: Self-pay

## 2019-04-25 ENCOUNTER — Ambulatory Visit
Admission: RE | Admit: 2019-04-25 | Discharge: 2019-04-25 | Disposition: A | Discharge status: Home / Self Care | Payer: Medicare Other | Source: Ambulatory Visit | Attending: Interventional Radiology | Admitting: Interventional Radiology

## 2019-04-25 DIAGNOSIS — C220 Liver cell carcinoma: Secondary | ICD-10-CM | POA: Diagnosis not present

## 2019-04-25 HISTORY — PX: IR RADIOLOGIST EVAL & MGMT: IMG5224

## 2019-04-25 NOTE — Progress Notes (Signed)
Chief Complaint: Patient was consulted remotely today (TeleHealth) for hepatocellular carcinoma at the request of [place].    Referring Physician(s): Stark Klein, MD  History of Present Illness: Calvin Gardner is a 68 y.o. male  with a history of HCV and EtOH cirrhosis complicated by development of a large 7.4 cm partially exophytic mass arising from the inferior aspect of hepatic segment 6. MR imaging demonstrates Li-RADS 5imagingfeatures diagnostic of hepatocellular carcinoma.  He was deemed not to be a surgical candidate and therefore underwent liver directed therapy with drug-eluting bead chemoembolization on 07/19/2018.   Calvin Gardner is doing welle.  He has no active complaints at this time and denies abdominal pain, fever, chills, nausea, vomiting or other new or systemic symptoms.  He was very pleased to hear the results of his MRI.  Past Medical History:  Diagnosis Date  . Alcoholism ([place])   . Aortic atherosclerosis ([place])   . Arthritis   . Cancer ([place])    liver  . Cataract   . Hepatitis C   . HLD (hyperlipidemia)   . Hypertension   . Liver mass   . Pulmonary nodule     Past Surgical History:  Procedure Laterality Date  . COLONOSCOPY WITH PROPOFOL N/A 01/30/2014   Procedure: COLONOSCOPY WITH PROPOFOL;  Surgeon: Garlan Fair, MD;  Location: [facility];  Service: Endoscopy;  Laterality: N/A;  . ESOPHAGOGASTRODUODENOSCOPY (EGD) WITH PROPOFOL N/A 01/30/2014   Procedure: ESOPHAGOGASTRODUODENOSCOPY (EGD) WITH PROPOFOL;  Surgeon: Garlan Fair, MD;  Location: [facility];  Service: Endoscopy;  Laterality: N/A;  . IR ANGIOGRAM SELECTIVE EACH ADDITIONAL VESSEL  07/19/2018  . IR ANGIOGRAM SELECTIVE EACH ADDITIONAL VESSEL  07/19/2018  . IR ANGIOGRAM SELECTIVE EACH ADDITIONAL VESSEL  07/19/2018  . IR ANGIOGRAM SELECTIVE EACH ADDITIONAL VESSEL  07/19/2018  . IR ANGIOGRAM SELECTIVE EACH ADDITIONAL VESSEL  07/19/2018  . IR ANGIOGRAM VISCERAL  SELECTIVE  07/19/2018  . IR EMBO TUMOR ORGAN ISCHEMIA INFARCT INC GUIDE ROADMAPPING  07/19/2018  . IR RADIOLOGIST EVAL & MGMT  07/07/2018  . IR RADIOLOGIST EVAL & MGMT  08/16/2018  . IR RADIOLOGIST EVAL & MGMT  01/31/2019  . IR US GUIDE VASC ACCESS LEFT  07/19/2018  . left jaw surgery     . MANDIBLE FRACTURE SURGERY      Allergies: Patient has no known allergies.  Medications: Prior to Admission medications   Medication Sig Start Date End Date Taking? Authorizing Provider  amLODipine (NORVASC) 5 MG tablet Take 1 tablet (5 mg total) by mouth daily. 08/31/18   Charlott Rakes, MD  aspirin EC 81 MG tablet Take 81 mg by mouth every morning. Reported on 12/19/2015    [provider]  omeprazole (PRILOSEC) 40 MG capsule Take 1 capsule (40 mg total) by mouth daily. 05/31/18   Thornton Park, MD  ondansetron (ZOFRAN) 4 MG tablet Take 4 mg by mouth every 8 (eight) hours as needed for nausea or vomiting.    [provider]  oxyCODONE (ROXICODONE) 5 MG immediate release tablet Take 5 mg by mouth every 6 (six) hours as needed for moderate pain or severe pain (abdominal pain).    [provider]  tamsulosin (FLOMAX) 0.4 MG CAPS capsule Take 1 capsule (0.4 mg total) by mouth daily. 08/31/18   Charlott Rakes, MD     Family History  Problem Relation Age of Onset  . Cancer Mother        type unknown  . Emphysema Brother   . Colon cancer Neg  Hx   . Esophageal cancer Neg Hx   . Pancreatic cancer Neg Hx     Social History   Socioeconomic History  . Marital status: Single    Spouse name: Not on file  . Number of children: 1  . Years of education: Not on file  . Highest education level: Not on file  Occupational History  . Occupation: works in Public relations account executive  . Financial resource strain: Not on file  . Food insecurity    Worry: Not on file    Inability: Not on file  . Transportation needs    Medical: Not on file    Non-medical: Not on file  Tobacco Use   . Smoking status: Current Some Day Smoker    Packs/day: 1.00    Years: 50.00    Pack years: 50.00    Types: Cigarettes  . Smokeless tobacco: Never Used  . Tobacco comment: trying to quit, smoke every other day  Substance and Sexual Activity  . Alcohol use: Yes    Alcohol/week: 0.0 standard drinks    Comment: 2-3 40 oz beers per day, x50 years  . Drug use: No    Comment: remote IV drug use age 62   . Sexual activity: Yes    Partners: Female, Male    Comment: patient declines  Lifestyle  . Physical activity    Days per week: Not on file    Minutes per session: Not on file  . Stress: Not on file  Relationships  . Social Herbalist on phone: Not on file    Gets together: Not on file    Attends religious service: Not on file    Active member of club or organization: Not on file    Attends meetings of clubs or organizations: Not on file    Relationship status: Not on file  Other Topics Concern  . Not on file  Social History Narrative  . Not on file    ECOG Status: 0 - Asymptomatic  Review of Systems  Review of Systems: A 12 point ROS discussed and pertinent positives are indicated in the HPI above.  All other systems are negative.  Physical Exam No direct physical exam was performed (except for noted visual exam findings with Video Visits).   Vital Signs: There were no vitals taken for this visit.  Imaging: Mr Abdomen Wwo Contrast  Result Date: 04/20/2019 CLINICAL DATA:  Hepatocellular carcinoma ([place]); Calvin Gardner status post locoregional therapy with drug-eluting bead chemoembolization for hepatocellular carcinoma. EXAM: MRI ABDOMEN WITHOUT AND WITH CONTRAST TECHNIQUE: Multiplanar multisequence MR imaging of the abdomen was performed both before and after the administration of intravenous contrast. CONTRAST:  15mL GADAVIST GADOBUTROL 1 MMOL/ML IV SOLN COMPARISON:  Examination of June 25th 2020 and examination of October 18, 2018. FINDINGS: Lower chest:  No signs of pleural effusion. Limited assessment of the lower chest on MR is unremarkable. Hepatobiliary: Mild caudate enlargement and fissural widening with mild left lobe hypertrophy. Partially exophytic lesion post treatment, arising from the medial aspect of the right hepatic lobe, hepatic segment V at the boundary of hepatic segment VI. Lesion shows heterogeneity with baseline intrinsic T1 hyperintensity. Arterial phase mildly limited due to early arterial act was a shin. No signs of enhancement on "late arterial/early venous phase". Lesion measures 5 x 4.5 by 5.0 cm dimension. Area of equivocal enhancement on later phases along the anterior margin of the treated lesion at approximately 0.8 cm. This area does  not display washout. Pancreas: No focal pancreatic lesion. Mild pancreatic ductal dilation, unchanged. Spleen:  Within normal limits.  No signs of splenomegaly Adrenals/Urinary Tract: Normal appearance of the adrenal glands. Small cyst in the upper pole the right kidney. No hydronephrosis or renal mass. Stomach/Bowel: Limited assessment of the gastrointestinal tract is unremarkable to the extent visualized. Vascular/Lymphatic:  Vascular structures are patent. Classic hepatic arterial anatomy. Patent portal vein. Patent hepatic veins. No signs of adenopathy. Other:  No additional findings of note Musculoskeletal: Spinal degenerative changes in the lower lumbar spine. No signs of destructive bone process. IMPRESSION: 1. LR TR equivocal: Treated lesion with diminished size showing equivocal later phase enhancement and no signs of "arterial phase" enhancement or washout. Nodularity along the inferior margin that shows intrinsic T1 hyperintensity makes interpretation challenging. Granulation tissue or baseline intrinsic T1 hyperintensity is favored over viable disease, suggest continued attention on follow-up with 3-6 month MRI assessment. 2. Signs of potential early cirrhosis but without overt signs of portal  hypertension. 3. No signs of extrahepatic disease or new lesion. Electronically Signed   By: Zetta Bills M.D.   On: 04/20/2019 10:07    Labs:  CBC: Recent Labs    08/03/18 2101 08/09/18 1216 01/18/19 0852 04/17/19 0824  WBC 8.4 9.4 8.0 8.8  HGB 10.3* 10.4* 14.4 14.4  HCT 31.3* 31.9* 44.5 44.5  PLT 563* 452* 282 312    COAGS: Recent Labs    08/03/18 2101 08/09/18 1216 01/18/19 0852 04/17/19 0824  INR 1.21 1.02 1.0 1.0  APTT 30  --   --   --     BMP: Recent Labs    08/03/18 2101 08/09/18 1216 10/18/18 0902 01/18/19 0852 04/17/19 0824  NA 131* 134*  --  136 140  K 3.1* 3.9  --  4.7 4.8  CL 95* 98  --  103 105  CO2 25 24  --  24 26  GLUCOSE 124* 128*  --  162* 53*  BUN <5* <4*  --  9 11  CALCIUM 9.2 10.1  --  10.6* 10.1  CREATININE 0.84 0.71 0.70 0.80 0.98  GFRNONAA >60 >60  --  93 80  GFRAA >60 >60  --  108 93    LIVER FUNCTION TESTS: Recent Labs    07/20/18 0400 07/24/18 1555 08/03/18 2101 08/09/18 1216 01/18/19 0852 04/17/19 0824  BILITOT 0.5 0.8 0.6 0.4 0.4 0.2  AST 124* 56* 27 20 23 25   ALT 76* 93* 10 13 7* 19  ALKPHOS 39 57 64 80  --   --   PROT 7.3 6.8 6.8 7.4 7.4 7.5  ALBUMIN 3.9 3.5 3.1* 3.2*  --   --     TUMOR MARKERS: Recent Labs    01/18/19 0852 04/17/19 0824  AFPTM 1.6 1.7    Assessment and Plan:  Calvin Gardner continues to do well 9 months status post drug-eluting bead chemoembolization of his hepatocellular he has had a remarkable outcome with an excellent treatment effect.  No convincing evidence of residual, recurrent or new disease by follow-up MRI.  Continue surveillance as planned.  1.)  follow-up MRI of the liver with gadolinium and clinic visit in mid to late December.   Electronically Signed: Jacqulynn Cadet 04/25/2019, 8:58 AM   I spent a total of  15 Minutes in remote  clinical consultation, greater than 50% of which was counseling/coordinating care for hepatocellular cancer.    Visit type: Audio only  (telephone). Audio (no video) only due to patient preference.  Alternative for in-person consultation at [facility], [address] [place], [place], [place]. This visit type was conducted due to national recommendations for restrictions regarding the COVID-19 Pandemic (e.g. social distancing).  This format is felt to be most appropriate for this patient at this time.  All issues noted in this document were discussed and addressed.

## 2019-05-17 ENCOUNTER — Other Ambulatory Visit: Payer: Self-pay | Admitting: Pharmacist

## 2019-05-17 ENCOUNTER — Other Ambulatory Visit: Payer: Self-pay | Admitting: Gastroenterology

## 2019-05-17 MED FILL — AMLODIPINE BESYLATE 5 MG TA: 5 | 30 days supply | Qty: 30 | Fill #6

## 2019-05-17 MED FILL — TAMSULOSIN HCL 0.4 MG CAP: 0.4 | 30 days supply | Qty: 30 | Fill #6

## 2019-05-17 MED FILL — OMEPRAZOLE DR 40 MG CAPSULE: 40 | 60 days supply | Qty: 60 | Fill #0

## 2019-05-17 NOTE — Progress Notes (Signed)
Opened in error

## 2019-06-08 ENCOUNTER — Other Ambulatory Visit: Payer: Self-pay | Admitting: *Deleted

## 2019-06-08 ENCOUNTER — Other Ambulatory Visit: Payer: Self-pay | Admitting: Interventional Radiology

## 2019-06-08 DIAGNOSIS — C220 Liver cell carcinoma: Secondary | ICD-10-CM

## 2019-06-09 NOTE — Progress Notes (Signed)
[place]   Telephone:([phone] [phone]   Clinic Follow up Note   Patient Care Team: Charlott Rakes, MD as PCP - General (Family Medicine)  Date of Service:  06/12/2019  CHIEF COMPLAINT: F/u on [place]  SUMMARY OF ONCOLOGIC HISTORY: Oncology History Overview Note  Cancer Staging Hepatocellular carcinoma ([place]) Staging form: Liver, AJCC 8th Edition - Clinical: Stage IIIA (cT3, cN0, cM0) - Unsigned     Hepatocellular carcinoma ([place])  10/05/2014 Imaging   ABD US IMPRESSION: Coarse hepatic echotexture with hyperechoic hepatic parenchyma, suggesting hepatic steatosis and/or mild cirrhosis. Median hepatic shear wave velocity is calculated at 1.54 m/sec. Corresponding Metavir fibrosis score is F2/F3. Risk of fibrosis is moderate. Follow-up:  Additional testing appropriate.   10/14/2017 Imaging   Liver: There is coarse increased echotexture of the liver. There is a 6.2 x 6.1 x 6.2 cm complex echotexture mass in the liver. Portal vein is patent on color Doppler imaging with normal direction of blood flow towards the liver.  IMPRESSION: Mass in the right lobe liver. Further evaluation with three-phase liver CT is recommended.   01/14/2018 Imaging   CT CHEST IMPRESSION: 1. Lung-RADS 2S, benign appearance or behavior. Continue annual screening with low-dose chest CT without contrast in 12 months. 2. The "S" modifier above refers to potentially clinically significant non lung cancer related findings. Specifically, there is aortic atherosclerosis, in addition to left anterior descending coronary artery disease. Please note that although the presence of coronary artery calcium documents the presence of coronary artery disease, the severity of this disease and any potential stenosis cannot be assessed on this non-gated CT examination. Assessment for potential risk factor modification, dietary therapy or pharmacologic therapy may be warranted, if clinically  indicated. 3. Mild diffuse bronchial wall thickening with mild centrilobular and mild-to-moderate paraseptal emphysema; imaging findings suggestive of underlying COPD. 4. Hepatic steatosis.  Aortic Atherosclerosis (ICD10-I70.0) and Emphysema (ICD10-J43.9).    05/16/2018 Tumor Marker   AFP 8.9     05/24/2018 Imaging   CT ABD/LIVER IMPRESSION: 1. There is a large hypervascular mass within segment 5 of the liver. In a patient at high risk for [facility] this is concerning for hepatocellular carcinoma. 2. Morphologic features of the liver suggestive of early cirrhosis. 3. Pulmonary nodule in the right middle lobe is new from 01/14/2018 measuring 6 mm. Non-contrast chest CT at 6-12 months is recommended. If the nodule is stable at time of repeat CT, then future CT at 18-24 months (from today's scan) is considered optional for low-risk patients, but is recommended for high-risk patients. This recommendation follows the consensus statement: Guidelines for Management of Incidental Pulmonary Nodules Detected on CT Images: From the Fleischner Society 2017; Radiology 2017; 284:228-243. 4.  Aortic Atherosclerosis (ICD10-I70.0).    05/31/2018 Imaging   MRI liver findings:  7.4 x 6.8 x 7.1 cm well-circumscribed enhancing mass along the inferior aspect of segment 6 (series 6/image 18). While the enhancement characteristics are poorly evaluated due to motion degradation, an enhancing rim is suspected (series 17/image 41). By definition, this is a LI-RADS 5 lesion given the history of hepatitis C. Suspected signal loss within the lesion on opposed phase imaging (series 7/image 35), which supports the diagnosis of well-differentiated HCC.   06/09/2018 Initial Diagnosis   Hepatocellular carcinoma ([place])   07/19/2018 Procedure   TACE by Dr. Laurence Ferrari    10/18/2018 Imaging   MRI Abdomen 10/18/18  IMPRESSION: 1. Slight interval decrease in size of the right hepatic lobe mass. Internal hemorrhage but no  worrisome internal enhancement. Areas of slight nodular capsular enhancement is indeterminate and may represent enhancing granulation tissue. Recommend continued surveillance. 2. No new hepatic lesions or adenopathy.   01/26/2019 Imaging   MRI Abdomen 01/26/19 IMPRESSION: 1. Continued decreased size of ablated lesion in the right lobe of the liver which currently measures 5.7 x 5.0 x 5.3 cm. Surrounding low-level progressive delayed enhancement associated with this lesion is again favored to represent granulation tissue. No new lesions are identified.   04/20/2019 Imaging   MRI Abdomen 04/20/19 IMPRESSION: 1. LR TR equivocal: Treated lesion with diminished size showing equivocal later phase enhancement and no signs of "arterial phase" enhancement or washout. Nodularity along the inferior margin that shows intrinsic T1 hyperintensity makes interpretation challenging. Granulation tissue or baseline intrinsic T1 hyperintensity is favored over viable disease, suggest continued attention on follow-up with 3-6 month MRI assessment. 2. Signs of potential early cirrhosis but without overt signs of portal hypertension. 3. No signs of extrahepatic disease or new lesion.      CURRENT THERAPY:  Observation  INTERVAL HISTORY:  Calvin Gardner is here for a follow up. He presents to the clinic alone. He notes he is doing well. He saw Dr. Geroge Baseman in 04/2019 after MRI. He notes he is eating well and has no abdominal pain and has normal stool. He notes he is active and independent. He notes he drinks a 6 pack of beer.     REVIEW OF SYSTEMS:   Constitutional: Denies fevers, chills or abnormal weight loss Eyes: Denies blurriness of vision Ears, nose, mouth, throat, and face: Denies mucositis or sore throat Respiratory: Denies cough, dyspnea or wheezes Cardiovascular: Denies palpitation, chest discomfort or lower extremity swelling Gastrointestinal:  Denies nausea, heartburn or change in  bowel habits Skin: Denies abnormal skin rashes Lymphatics: Denies new lymphadenopathy or easy bruising Neurological:Denies numbness, tingling or new weaknesses Behavioral/Psych: Mood is stable, no new changes  All other systems were reviewed with the patient and are negative.  MEDICAL HISTORY:  Past Medical History:  Diagnosis Date  . Alcoholism ([place])   . Aortic atherosclerosis ([place])   . Arthritis   . Cancer ([place])    liver  . Cataract   . Hepatitis C   . HLD (hyperlipidemia)   . Hypertension   . Liver mass   . Pulmonary nodule     SURGICAL HISTORY: Past Surgical History:  Procedure Laterality Date  . COLONOSCOPY WITH PROPOFOL N/A 01/30/2014   Procedure: COLONOSCOPY WITH PROPOFOL;  Surgeon: Garlan Fair, MD;  [facility];  Service: Endoscopy;  Laterality: N/A;  . ESOPHAGOGASTRODUODENOSCOPY (EGD) WITH PROPOFOL N/A 01/30/2014   Procedure: ESOPHAGOGASTRODUODENOSCOPY (EGD) WITH PROPOFOL;  Surgeon: Garlan Fair, MD;  [facility];  Service: Endoscopy;  Laterality: N/A;  . IR ANGIOGRAM SELECTIVE EACH ADDITIONAL VESSEL  07/19/2018  . IR ANGIOGRAM SELECTIVE EACH ADDITIONAL VESSEL  07/19/2018  . IR ANGIOGRAM SELECTIVE EACH ADDITIONAL VESSEL  07/19/2018  . IR ANGIOGRAM SELECTIVE EACH ADDITIONAL VESSEL  07/19/2018  . IR ANGIOGRAM SELECTIVE EACH ADDITIONAL VESSEL  07/19/2018  . IR ANGIOGRAM VISCERAL SELECTIVE  07/19/2018  . IR EMBO TUMOR ORGAN ISCHEMIA INFARCT INC GUIDE ROADMAPPING  07/19/2018  . IR RADIOLOGIST EVAL & MGMT  07/07/2018  . IR RADIOLOGIST EVAL & MGMT  08/16/2018  . IR RADIOLOGIST EVAL & MGMT  01/31/2019  . IR RADIOLOGIST EVAL & MGMT  04/25/2019  . IR US GUIDE VASC ACCESS LEFT  07/19/2018  . left jaw surgery     .  MANDIBLE FRACTURE SURGERY      I have reviewed the social history and family history with the patient and they are unchanged from previous note.  ALLERGIES:  has No Known Allergies.  MEDICATIONS:  Current Outpatient Medications   Medication Sig Dispense Refill  . amLODipine (NORVASC) 5 MG tablet Take 1 tablet (5 mg total) by mouth daily. 30 tablet 6  . aspirin EC 81 MG tablet Take 81 mg by mouth every morning. Reported on 12/19/2015    . omeprazole (PRILOSEC) 40 MG capsule TAKE 1 CAPSULE (40 MG TOTAL) BY MOUTH DAILY. 60 capsule 3   No current facility-administered medications for this visit.     PHYSICAL EXAMINATION: ECOG PERFORMANCE STATUS: 0 - Asymptomatic  Vitals:   06/12/19 1254  BP: 138/90  Pulse: 89  Resp: 18  Temp: 98.2 F (36.8 C)  SpO2: 100%   Filed Weights   06/12/19 1254  Weight: 135 lb 11.2 oz (61.6 kg)    GENERAL:alert, no distress and comfortable SKIN: skin color, texture, turgor are normal, no rashes or significant lesions EYES: normal, Conjunctiva are pink and non-injected, sclera clear  NECK: supple, thyroid normal size, non-tender, without nodularity LYMPH:  no palpable lymphadenopathy in the cervical, axillary  LUNGS: clear to auscultation and percussion with normal breathing effort HEART: regular rate & rhythm and no murmurs and no lower extremity edema ABDOMEN:abdomen soft, non-tender and normal bowel sounds Musculoskeletal:no cyanosis of digits and no clubbing  NEURO: alert & oriented x 3 with fluent speech, no focal motor/sensory deficits  LABORATORY DATA:  I have reviewed the data as listed CBC Latest Ref Rng & Units 06/12/2019 04/17/2019 01/18/2019  WBC 4.0 - 10.5 K/uL 8.8 8.8 8.0  Hemoglobin 13.0 - 17.0 g/dL 13.5 14.4 14.4  Hematocrit 39.0 - 52.0 % 41.3 44.5 44.5  Platelets 150 - 400 K/uL 266 312 282     CMP Latest Ref Rng & Units 06/12/2019 04/17/2019 01/18/2019  Glucose 70 - 99 mg/dL 92 53(L) 162(H)  BUN 8 - 23 mg/dL 11 11 9   Creatinine 0.61 - 1.24 mg/dL 0.96 0.98 0.80  Sodium 135 - 145 mmol/L 140 140 136  Potassium 3.5 - 5.1 mmol/L 4.1 4.8 4.7  Chloride 98 - 111 mmol/L 104 105 103  CO2 22 - 32 mmol/L 25 26 24   Calcium 8.9 - 10.3 mg/dL 9.5 10.1 10.6(H)  Total  Protein 6.5 - 8.1 g/dL 7.8 7.5 7.4  Total Bilirubin 0.3 - 1.2 mg/dL 0.4 0.2 0.4  Alkaline Phos 38 - 126 U/L 75 - -  AST 15 - 41 U/L 22 25 23   ALT 0 - 44 U/L 9 19 7(L)      RADIOGRAPHIC STUDIES: I have personally reviewed the radiological images as listed and agreed with the findings in the report. No results found.   ASSESSMENT & PLAN:  Calvin Gardner is a 68 y.o. male with    1. Hepatocellular carcinoma, in segment 6, cT3N0M0, stage IIIA -He was diagnosed in 06/2018. He had DEB-TACE treatment with Dr. Laurence Ferrari on 07/19/18. -He is currently on observation. -He is clinically doing well. Physical exam unremarkable today. Labs reviewed, CBC and CMP WNL. AFP still pending.  -His MRI abdomen from 04/20/19 shows good response to previous treatment, no sign of extrahepatic disease or new lesion.  -We discussed the systemic therapy option if he has disease progression, and not a candidate for liver targeted therapy. -I discussed he is fine to continue to f/u with Dr. Geroge Baseman for now. I will  f/u as needed with him as needed in the future.    2. Hep C and liver cirrhosis  -f/u with Dr. Tarri Glenn    3. Alcohol Cessation -He drinks a 6 back of beer still.  -I encouraged him to reduce and stop drinking completely given his liver disease. He understands and is willing to quit.    Plan  -He is clinically doing well  -He will continue to f/u with Dr. Geroge Baseman -F/u with me as needed in the future   No problem-specific Assessment & Plan notes found for this encounter.   No orders of the defined types were placed in this encounter.  All questions were answered. The patient knows to call the clinic with any problems, questions or concerns. No barriers to learning was detected. I spent 15 minutes counseling the patient face to face. The total time spent in the appointment was 20 minutes and more than 50% was on counseling and review of test results     Truitt Merle, MD 06/12/2019    I, Joslyn Devon, am acting as scribe for Truitt Merle, MD.   I have reviewed the above documentation for accuracy and completeness, and I agree with the above.

## 2019-06-12 ENCOUNTER — Inpatient Hospital Stay: Payer: Medicare Other | Attending: Hematology

## 2019-06-12 ENCOUNTER — Other Ambulatory Visit: Payer: Self-pay

## 2019-06-12 ENCOUNTER — Inpatient Hospital Stay (HOSPITAL_BASED_OUTPATIENT_CLINIC_OR_DEPARTMENT_OTHER): Payer: Medicare Other | Admitting: Hematology

## 2019-06-12 VITALS — BP 138/90 | HR 89 | Temp 98.2°F | Resp 18 | Ht 66.0 in | Wt 135.7 lb

## 2019-06-12 DIAGNOSIS — C220 Liver cell carcinoma: Secondary | ICD-10-CM | POA: Diagnosis not present

## 2019-06-12 DIAGNOSIS — I10 Essential (primary) hypertension: Secondary | ICD-10-CM | POA: Diagnosis not present

## 2019-06-12 DIAGNOSIS — K760 Fatty (change of) liver, not elsewhere classified: Secondary | ICD-10-CM | POA: Insufficient documentation

## 2019-06-12 DIAGNOSIS — B1920 Unspecified viral hepatitis C without hepatic coma: Secondary | ICD-10-CM | POA: Diagnosis not present

## 2019-06-12 DIAGNOSIS — I2510 Atherosclerotic heart disease of native coronary artery without angina pectoris: Secondary | ICD-10-CM | POA: Diagnosis not present

## 2019-06-12 DIAGNOSIS — Z79899 Other long term (current) drug therapy: Secondary | ICD-10-CM | POA: Insufficient documentation

## 2019-06-12 LAB — CMP (CANCER CENTER ONLY)
ALT: 9 U/L (ref 0–44)
AST: 22 U/L (ref 15–41)
Albumin: 4.2 g/dL (ref 3.5–5.0)
Alkaline Phosphatase: 75 U/L (ref 38–126)
Anion gap: 11 (ref 5–15)
BUN: 11 mg/dL (ref 8–23)
CO2: 25 mmol/L (ref 22–32)
Calcium: 9.5 mg/dL (ref 8.9–10.3)
Chloride: 104 mmol/L (ref 98–111)
Creatinine: 0.96 mg/dL (ref 0.61–1.24)
GFR, Est AFR Am: >60 mL/min (ref >60)
GFR, Estimated: >60 mL/min (ref >60)
Glucose, Bld: 92 mg/dL (ref 70–99)
Potassium: 4.1 mmol/L (ref 3.5–5.1)
Sodium: 140 mmol/L (ref 135–145)
Total Bilirubin: 0.4 mg/dL (ref 0.3–1.2)
Total Protein: 7.8 g/dL (ref 6.5–8.1)

## 2019-06-12 LAB — CBC WITH DIFFERENTIAL (CANCER CENTER ONLY)
Abs Immature Granulocytes: 0.02 10*3/uL (ref 0.00–0.07)
Basophils Absolute: 0 10*3/uL (ref 0.0–0.1)
Basophils Relative: 1 %
Eosinophils Absolute: 0.1 10*3/uL (ref 0.0–0.5)
Eosinophils Relative: 2 %
HCT: 41.3 % (ref 39.0–52.0)
Hemoglobin: 13.5 g/dL (ref 13.0–17.0)
Immature Granulocytes: 0 %
Lymphocytes Relative: 26 %
Lymphs Abs: 2.3 10*3/uL (ref 0.7–4.0)
MCH: 28.5 pg (ref 26.0–34.0)
MCHC: 32.7 g/dL (ref 30.0–36.0)
MCV: 87.1 fL (ref 80.0–100.0)
Monocytes Absolute: 1 10*3/uL (ref 0.1–1.0)
Monocytes Relative: 12 %
Neutro Abs: 5.3 10*3/uL (ref 1.7–7.7)
Neutrophils Relative %: 59 %
Platelet Count: 266 10*3/uL (ref 150–400)
RBC: 4.74 MIL/uL (ref 4.22–5.81)
RDW: 13.3 % (ref 11.5–15.5)
WBC Count: 8.8 10*3/uL (ref 4.0–10.5)
nRBC: 0 % (ref 0.0–0.2)

## 2019-06-13 ENCOUNTER — Encounter: Payer: Self-pay | Admitting: Hematology

## 2019-06-13 ENCOUNTER — Telehealth: Payer: Self-pay | Admitting: Hematology

## 2019-06-13 LAB — AFP TUMOR MARKER: AFP, Serum, Tumor Marker: 1.5 ng/mL (ref 0.0–8.3)

## 2019-06-13 NOTE — Telephone Encounter (Signed)
No los per 1/19. 

## 2019-06-14 ENCOUNTER — Telehealth: Payer: Self-pay | Admitting: *Deleted

## 2019-06-14 NOTE — Telephone Encounter (Signed)
Per Cira Rue, NP, called and made pt aware that AFP remains normal and no concerns. Pt verbalized understanding.

## 2019-06-14 NOTE — Telephone Encounter (Signed)
-----   Message from Alla Feeling, NP sent at 06/14/2019  8:17 AM EST ----- Please let him know AFP remains normal, no concerns.  Thanks, Regan Rakers NP

## 2019-06-20 ENCOUNTER — Telehealth: Payer: Medicare Other

## 2019-07-12 DIAGNOSIS — C220 Liver cell carcinoma: Secondary | ICD-10-CM | POA: Diagnosis not present

## 2019-07-13 LAB — CBC
HCT: 45.2 % (ref 38.5–50.0)
Hemoglobin: 14.6 g/dL (ref 13.2–17.1)
MCH: 28.7 pg (ref 27.0–33.0)
MCHC: 32.3 g/dL (ref 32.0–36.0)
MCV: 89 fL (ref 80.0–100.0)
MPV: 9.4 fL (ref 7.5–12.5)
Platelets: 282 10*3/uL (ref 140–400)
RBC: 5.08 10*6/uL (ref 4.20–5.80)
RDW: 12.3 % (ref 11.0–15.0)
WBC: 8.5 10*3/uL (ref 3.8–10.8)

## 2019-07-13 LAB — COMPLETE METABOLIC PANEL WITH GFR
AG Ratio: 1.4 (calc) (ref 1.0–2.5)
ALT: 9 U/L (ref 9–46)
AST: 22 U/L (ref 10–35)
Albumin: 4.4 g/dL (ref 3.6–5.1)
Alkaline phosphatase (APISO): 69 U/L (ref 35–144)
BUN: 9 mg/dL (ref 7–25)
CO2: 24 mmol/L (ref 20–32)
Calcium: 10.1 mg/dL (ref 8.6–10.3)
Chloride: 101 mmol/L (ref 98–110)
Creat: 0.97 mg/dL (ref 0.70–1.25)
GFR, Est African American: 94 mL/min/{1.73_m2} (ref >=60)
GFR, Est Non African American: 81 mL/min/{1.73_m2} (ref >=60)
Globulin: 3.1 g/dL (calc) (ref 1.9–3.7)
Glucose, Bld: 95 mg/dL (ref 65–99)
Potassium: 4.9 mmol/L (ref 3.5–5.3)
Sodium: 137 mmol/L (ref 135–146)
Total Bilirubin: 0.5 mg/dL (ref 0.2–1.2)
Total Protein: 7.5 g/dL (ref 6.1–8.1)

## 2019-07-13 LAB — AFP TUMOR MARKER: AFP-Tumor Marker: 1.4 ng/mL (ref <6.1)

## 2019-07-13 LAB — PROTIME-INR
INR: 1
Prothrombin Time: 10.9 s (ref 9.0–11.5)

## 2019-07-18 ENCOUNTER — Ambulatory Visit (HOSPITAL_COMMUNITY)
Admission: RE | Admit: 2019-07-18 | Discharge: 2019-07-18 | Disposition: A | Discharge status: Home / Self Care | Payer: Medicare Other | Source: Ambulatory Visit | Attending: Interventional Radiology | Admitting: Interventional Radiology

## 2019-07-18 ENCOUNTER — Other Ambulatory Visit: Payer: Self-pay

## 2019-07-18 DIAGNOSIS — C220 Liver cell carcinoma: Secondary | ICD-10-CM | POA: Diagnosis present

## 2019-07-18 MED ORDER — GADOBUTROL 1 MMOL/ML IV SOLN
5.0000 mL | Freq: Once | INTRAVENOUS | Status: AC | PRN
  Administered 2019-07-18: 09:00:00 5 mL via INTRAVENOUS

## 2019-07-20 ENCOUNTER — Encounter: Payer: Self-pay | Admitting: *Deleted

## 2019-07-20 ENCOUNTER — Ambulatory Visit
Admission: RE | Admit: 2019-07-20 | Discharge: 2019-07-20 | Disposition: A | Discharge status: Home / Self Care | Payer: Medicare Other | Source: Ambulatory Visit | Attending: Interventional Radiology | Admitting: Interventional Radiology

## 2019-07-20 DIAGNOSIS — C220 Liver cell carcinoma: Secondary | ICD-10-CM

## 2019-07-20 HISTORY — PX: IR RADIOLOGIST EVAL & MGMT: IMG5224

## 2019-07-20 NOTE — Progress Notes (Signed)
Chief Complaint: Patient was seen in follow-up remotely today (TeleHealth) for  at the request of [place].    Referring Physician(s): Stark Klein, MD  History of Present Illness: Calvin Gardner is a 68 y.o. male with a history of HCV and EtOH cirrhosis complicated by development of a large 7.4 cm partially exophytic mass arising from the inferior aspect of hepatic segment 6. MR imaging demonstrates Li-RADS 5imagingfeatures diagnostic of hepatocellular carcinoma.  He was deemed not to be a surgical candidate and therefore underwent liver directed therapy with drug-eluting bead chemoembolization on 07/19/2018.  Calvin Gardner continues to do very well. He has no active complaints at this time and denies abdominal pain, fever, chills, nausea, vomiting or other new or systemic symptoms. He was very pleased to hear the results of his MRI.    Past Medical History:  Diagnosis Date  . Alcoholism ([place])   . Aortic atherosclerosis ([place])   . Arthritis   . Cancer ([place])    liver  . Cataract   . Hepatitis C   . HLD (hyperlipidemia)   . Hypertension   . Liver mass   . Pulmonary nodule     Past Surgical History:  Procedure Laterality Date  . COLONOSCOPY WITH PROPOFOL N/A 01/30/2014   Procedure: COLONOSCOPY WITH PROPOFOL;  Surgeon: Garlan Fair, MD;  Location: [facility];  Service: Endoscopy;  Laterality: N/A;  . ESOPHAGOGASTRODUODENOSCOPY (EGD) WITH PROPOFOL N/A 01/30/2014   Procedure: ESOPHAGOGASTRODUODENOSCOPY (EGD) WITH PROPOFOL;  Surgeon: Garlan Fair, MD;  Location: [facility];  Service: Endoscopy;  Laterality: N/A;  . IR ANGIOGRAM SELECTIVE EACH ADDITIONAL VESSEL  07/19/2018  . IR ANGIOGRAM SELECTIVE EACH ADDITIONAL VESSEL  07/19/2018  . IR ANGIOGRAM SELECTIVE EACH ADDITIONAL VESSEL  07/19/2018  . IR ANGIOGRAM SELECTIVE EACH ADDITIONAL VESSEL  07/19/2018  . IR ANGIOGRAM SELECTIVE EACH ADDITIONAL VESSEL  07/19/2018  . IR ANGIOGRAM VISCERAL SELECTIVE   07/19/2018  . IR EMBO TUMOR ORGAN ISCHEMIA INFARCT INC GUIDE ROADMAPPING  07/19/2018  . IR RADIOLOGIST EVAL & MGMT  07/07/2018  . IR RADIOLOGIST EVAL & MGMT  08/16/2018  . IR RADIOLOGIST EVAL & MGMT  01/31/2019  . IR RADIOLOGIST EVAL & MGMT  04/25/2019  . IR US GUIDE VASC ACCESS LEFT  07/19/2018  . left jaw surgery     . MANDIBLE FRACTURE SURGERY      Allergies: Patient has no known allergies.  Medications: Prior to Admission medications   Medication Sig Start Date End Date Taking? Authorizing Provider  amLODipine (NORVASC) 5 MG tablet Take 1 tablet (5 mg total) by mouth daily. 08/31/18   Charlott Rakes, MD  aspirin EC 81 MG tablet Take 81 mg by mouth every morning. Reported on 12/19/2015    [provider]  omeprazole (PRILOSEC) 40 MG capsule TAKE 1 CAPSULE (40 MG TOTAL) BY MOUTH DAILY. 05/17/19   Thornton Park, MD     Family History  Problem Relation Age of Onset  . Cancer Mother        type unknown  . Emphysema Brother   . Colon cancer Neg Hx   . Esophageal cancer Neg Hx   . Pancreatic cancer Neg Hx     Social History   Socioeconomic History  . Marital status: Single    Spouse name: Not on file  . Number of children: 1  . Years of education: Not on file  . Highest education level: Not on file  Occupational History  . Occupation: works in Consulting civil engineer   Tobacco  Use  . Smoking status: Current Some Day Smoker    Packs/day: 1.00    Years: 50.00    Pack years: 50.00    Types: Cigarettes  . Smokeless tobacco: Never Used  . Tobacco comment: trying to quit, smoke every other day  Substance and Sexual Activity  . Alcohol use: Yes    Alcohol/week: 0.0 standard drinks    Comment: 2-3 40 oz beers per day, x50 years  . Drug use: No    Comment: remote IV drug use age 33   . Sexual activity: Yes    Partners: Female, Male    Comment: patient declines  Other Topics Concern  . Not on file  Social History Narrative  . Not on file   Social Determinants of Health    Financial Resource Strain:   . Difficulty of Paying Living Expenses: Not on file  Food Insecurity:   . Worried About Charity fundraiser in the Last Year: Not on file  . Ran Out of Food in the Last Year: Not on file  Transportation Needs:   . Lack of Transportation (Medical): Not on file  . Lack of Transportation (Non-Medical): Not on file  Physical Activity:   . Days of Exercise per Week: Not on file  . Minutes of Exercise per Session: Not on file  Stress:   . Feeling of Stress : Not on file  Social Connections:   . Frequency of Communication with Friends and Family: Not on file  . Frequency of Social Gatherings with Friends and Family: Not on file  . Attends Religious Services: Not on file  . Active Member of Clubs or Organizations: Not on file  . Attends Archivist Meetings: Not on file  . Marital Status: Not on file    ECOG Status: 0 - Asymptomatic  Review of Systems  Review of Systems: A 12 point ROS discussed and pertinent positives are indicated in the HPI above.  All other systems are negative.  Physical Exam No direct physical exam was performed (except for noted visual exam findings with Video Visits).   Vital Signs: There were no vitals taken for this visit.  Imaging: MR ABDOMEN WWO CONTRAST  Result Date: 07/18/2019 CLINICAL DATA:  Follow-up hepatocellular carcinoma EXAM: MRI ABDOMEN WITHOUT AND WITH CONTRAST TECHNIQUE: Multiplanar multisequence MR imaging of the abdomen was performed both before and after the administration of intravenous contrast. CONTRAST:  68mL GADAVIST GADOBUTROL 1 MMOL/ML IV SOLN COMPARISON:  04/20/2019 FINDINGS: Lower chest: No acute findings. Hepatobiliary: Mild changes of cirrhosis. The treated lesion within segment 5 and segment 6 is again noted. The lesion measures 4.0 by 4.5 by 4.5 cm (volume = 42 cm^3), image 64/11 and image 57/1002. On the previous examination this measured 5 x 4.5 x 5.0 cm (volume = 60 cm^3). No convincing  evidence for internal enhancement identified. No new liver lesions identified. Pancreas: No mass, inflammatory changes, or other parenchymal abnormality identified. Spleen:  Within normal limits in size and appearance. Adrenals/Urinary Tract: Normal adrenal glands. Tiny cyst within upper pole of the right kidney measures 6 mm. No kidney mass or hydronephrosis identified. Stomach/Bowel: Visualized portions within the abdomen are unremarkable. Vascular/Lymphatic: No pathologically enlarged lymph nodes identified. No abdominal aortic aneurysm demonstrated. Other:  No free fluid or fluid collections. Musculoskeletal: No suspicious bone lesions identified. IMPRESSION: 1. Treated lesion demonstrates continued decrease in size compared with 04/20/2019. No convincing evidence for residual/recurrent arterial phase enhancement within this lesion. 2. No new liver lesions identified.  3. Mild changes of cirrhosis. Electronically Signed   By: Kerby Moors M.D.   On: 07/18/2019 10:45    Labs:  CBC: Recent Labs    01/18/19 0852 04/17/19 0824 06/12/19 1156 07/12/19 0837  WBC 8.0 8.8 8.8 8.5  HGB 14.4 14.4 13.5 14.6  HCT 44.5 44.5 41.3 45.2  PLT 282 312 266 282    COAGS: Recent Labs    08/03/18 2101 08/09/18 1216 01/18/19 0852 04/17/19 0824 07/12/19 0837  INR 1.21 1.02 1.0 1.0 1.0  APTT 30  --   --   --   --     BMP: Recent Labs    01/18/19 0852 04/17/19 0824 06/12/19 1156 07/12/19 0837  NA 136 140 140 137  K 4.7 4.8 4.1 4.9  CL 103 105 104 101  CO2 24 26 25 24   GLUCOSE 162* 53* 92 95  BUN 9 11 11 9   CALCIUM 10.6* 10.1 9.5 10.1  CREATININE 0.80 0.98 0.96 0.97  GFRNONAA 93 80 >60 81  GFRAA 108 93 >60 94    LIVER FUNCTION TESTS: Recent Labs    07/24/18 1555 08/03/18 2101 08/09/18 1216 01/18/19 0852 04/17/19 0824 06/12/19 1156 07/12/19 0837  BILITOT 0.8 0.6 0.4 0.4 0.2 0.4 0.5  AST 56* 27 20 23 25 22 22   ALT 93* 10 13 7* 19 9 9   ALKPHOS 57 64 80  --   --  75  --   PROT 6.8  6.8 7.4 7.4 7.5 7.8 7.5  ALBUMIN 3.5 3.1* 3.2*  --   --  4.2  --     TUMOR MARKERS: Recent Labs    01/18/19 0852 04/17/19 0824 07/12/19 0837  AFPTM 1.6 1.7 1.4    Assessment and Plan:  Continued excellent response to therapy at 1 year status post drug-eluting bead transarterial chemoembolization.  Follow-up MRI today demonstrates 30% reduction in tumor volume compared to 3 months previously and no evidence of residual or recurrent enhancing tissue.  1.)  Next surveillance MRI with gadolinium contrast and clinic visit in 3 months.   Electronically Signed: Jacqulynn Cadet 07/20/2019, 3:51 PM   I spent a total of  15 Minutes in remote  clinical consultation, greater than 50% of which was counseling/coordinating care for hepatocellular cancer.    Visit type: Audio only (telephone). Audio (no video) only due to patient preference. Alternative for in-person consultation at [facility], [address], [place], [place]. This visit type was conducted due to national recommendations for restrictions regarding the COVID-19 Pandemic (e.g. social distancing).  This format is felt to be most appropriate for this patient at this time.  All issues noted in this document were discussed and addressed.

## 2019-08-10 ENCOUNTER — Ambulatory Visit: Payer: Medicare Other | Attending: Family | Admitting: Family

## 2019-08-10 ENCOUNTER — Encounter: Payer: Self-pay | Admitting: Family

## 2019-08-10 ENCOUNTER — Ambulatory Visit: Payer: Medicare Other | Attending: Family Medicine | Admitting: Pharmacist

## 2019-08-10 ENCOUNTER — Other Ambulatory Visit: Payer: Self-pay

## 2019-08-10 VITALS — BP 167/92 | HR 99 | Resp 16 | Wt 138.0 lb

## 2019-08-10 DIAGNOSIS — Z23 Encounter for immunization: Secondary | ICD-10-CM | POA: Diagnosis not present

## 2019-08-10 DIAGNOSIS — I10 Essential (primary) hypertension: Secondary | ICD-10-CM

## 2019-08-10 MED ORDER — AMLODIPINE BESYLATE 5 MG PO TABS: 5.0000 mg | ORAL_TABLET | Freq: Every day | ORAL | 0 refills | Status: DC

## 2019-08-10 MED FILL — AMLODIPINE BESYLATE 5 MG TA: 5 | 30 days supply | Qty: 30 | Fill #0

## 2019-08-10 NOTE — Progress Notes (Signed)
Patient ID: Calvin Gardner, male    DOB: 1951-05-20  MRN: [id number]  CC: Hypertension   Subjective: Calvin Gardner is a 69 y.o. male who presents for high blood pressure management. His concerns today include: blood pressure management and blood pressure pills. Currently prescribed Amlodipine 5 mg by mouth daily. Has not taken medication since November 2020. Denies monitoring blood pressure at home. Denies chest pain. Denies palpitations. Denies headache. Denies vision change. Denies shortness of breath. Denies leg swelling. Admits to high-salt diet and fried foods consumption. Exercise daily by walking in the community.   Patient Active Problem List   Diagnosis Date Noted  . Hepatocellular carcinoma ([place]) 06/09/2018  . Pulmonary nodule 05/25/2018  . GERD (gastroesophageal reflux disease) 01/10/2018  . Hiccough 10/07/2017  . Alcohol abuse 10/07/2017  . Chest pain 10/29/2015  . Liver fibrosis ([place]) 10/23/2014  . Chronic hepatitis C without hepatic coma ([place]) 09/19/2014  . IFG (impaired fasting glucose) 04/02/2014  . Vitamin D deficiency 04/02/2014  . Abnormal LFTs 04/02/2014  . Cataract, right eye 01/05/2014  . Essential hypertension, benign 01/05/2014  . Smoking 01/05/2014     Current Outpatient Medications on File Prior to Visit  Medication Sig Dispense Refill  . aspirin EC 81 MG tablet Take 81 mg by mouth every morning. Reported on 12/19/2015    . omeprazole (PRILOSEC) 40 MG capsule TAKE 1 CAPSULE (40 MG TOTAL) BY MOUTH DAILY. 60 capsule 3   No current facility-administered medications on file prior to visit.    No Known Allergies  Social History   Socioeconomic History  . Marital status: Single    Spouse name: Not on file  . Number of children: 1  . Years of education: Not on file  . Highest education level: Not on file  Occupational History  . Occupation: works in Museum/gallery conservator  . Smoking status: Current Some Day Smoker    Packs/day: 1.00   Years: 50.00    Pack years: 50.00    Types: Cigarettes  . Smokeless tobacco: Never Used  . Tobacco comment: trying to quit, smoke every other day  Substance and Sexual Activity  . Alcohol use: Yes    Alcohol/week: 0.0 standard drinks    Comment: 2-3 40 oz beers per day, x50 years  . Drug use: No    Comment: remote IV drug use age 79   . Sexual activity: Yes    Partners: Female, Male    Comment: patient declines  Other Topics Concern  . Not on file  Social History Narrative  . Not on file   Social Determinants of Health   Financial Resource Strain:   . Difficulty of Paying Living Expenses: Not on file  Food Insecurity:   . Worried About Charity fundraiser in the Last Year: Not on file  . Ran Out of Food in the Last Year: Not on file  Transportation Needs:   . Lack of Transportation (Medical): Not on file  . Lack of Transportation (Non-Medical): Not on file  Physical Activity:   . Days of Exercise per Week: Not on file  . Minutes of Exercise per Session: Not on file  Stress:   . Feeling of Stress : Not on file  Social Connections:   . Frequency of Communication with Friends and Family: Not on file  . Frequency of Social Gatherings with Friends and Family: Not on file  . Attends Religious Services: Not on file  . Active Member of Clubs  or Organizations: Not on file  . Attends Archivist Meetings: Not on file  . Marital Status: Not on file  Intimate Partner Violence:   . Fear of Current or Ex-Partner: Not on file  . Emotionally Abused: Not on file  . Physically Abused: Not on file  . Sexually Abused: Not on file    Family History  Problem Relation Age of Onset  . Cancer Mother        type unknown  . Emphysema Brother   . Colon cancer Neg Hx   . Esophageal cancer Neg Hx   . Pancreatic cancer Neg Hx     Past Surgical History:  Procedure Laterality Date  . COLONOSCOPY WITH PROPOFOL N/A 01/30/2014   Procedure: COLONOSCOPY WITH PROPOFOL;  Surgeon: Garlan Fair, MD;  Location: [facility];  Service: Endoscopy;  Laterality: N/A;  . ESOPHAGOGASTRODUODENOSCOPY (EGD) WITH PROPOFOL N/A 01/30/2014   Procedure: ESOPHAGOGASTRODUODENOSCOPY (EGD) WITH PROPOFOL;  Surgeon: Garlan Fair, MD;  Location: [facility];  Service: Endoscopy;  Laterality: N/A;  . IR ANGIOGRAM SELECTIVE EACH ADDITIONAL VESSEL  07/19/2018  . IR ANGIOGRAM SELECTIVE EACH ADDITIONAL VESSEL  07/19/2018  . IR ANGIOGRAM SELECTIVE EACH ADDITIONAL VESSEL  07/19/2018  . IR ANGIOGRAM SELECTIVE EACH ADDITIONAL VESSEL  07/19/2018  . IR ANGIOGRAM SELECTIVE EACH ADDITIONAL VESSEL  07/19/2018  . IR ANGIOGRAM VISCERAL SELECTIVE  07/19/2018  . IR EMBO TUMOR ORGAN ISCHEMIA INFARCT INC GUIDE ROADMAPPING  07/19/2018  . IR RADIOLOGIST EVAL & MGMT  07/07/2018  . IR RADIOLOGIST EVAL & MGMT  08/16/2018  . IR RADIOLOGIST EVAL & MGMT  01/31/2019  . IR RADIOLOGIST EVAL & MGMT  04/25/2019  . IR RADIOLOGIST EVAL & MGMT  07/20/2019  . IR US GUIDE VASC ACCESS LEFT  07/19/2018  . left jaw surgery     . MANDIBLE FRACTURE SURGERY      ROS: Cardiovascular: Denies chest pain. Denies palpitations. Denies murmurs. Respiratory: Denies shortness of breath. Denies cough.  PHYSICAL EXAM: BP (!) 167/92   Pulse 99   Resp 16   Wt 138 lb (62.6 kg)   SpO2 98%   BMI 22.27 kg/m  General appearance - alert, well appearing, and in no distress, oriented to person, place, and time and normal appearing weight Chest - clear to auscultation, no wheezes, rales or rhonchi, symmetric air entry, no tachypnea, retractions or cyanosis Heart - normal rate, regular rhythm, normal S1, S2, no murmurs, rubs, clicks or gallops  CMP Latest Ref Rng & Units 07/12/2019 06/12/2019 04/17/2019  Glucose 65 - 99 mg/dL 95 92 53(L)  BUN 7 - 25 mg/dL 9 11 11   Creatinine 0.70 - 1.25 mg/dL 0.97 0.96 0.98  Sodium 135 - 146 mmol/L 137 140 140  Potassium 3.5 - 5.3 mmol/L 4.9 4.1 4.8  Chloride 98 - 110 mmol/L 101 104 105  CO2 20 - 32 mmol/L 24  25 26   Calcium 8.6 - 10.3 mg/dL 10.1 9.5 10.1  Total Protein 6.1 - 8.1 g/dL 7.5 7.8 7.5  Total Bilirubin 0.2 - 1.2 mg/dL 0.5 0.4 0.2  Alkaline Phos 38 - 126 U/L - 75 -  AST 10 - 35 U/L 22 22 25   ALT 9 - 46 U/L 9 9 19    Lipid Panel     Component Value Date/Time   CHOL 156 03/18/2017 0834   TRIG 52 03/18/2017 0834   HDL 72 03/18/2017 0834   CHOLHDL 2.2 03/18/2017 0834   CHOLHDL 2.2 01/05/2014 1620   VLDL 12 01/05/2014  1620   LDLCALC 74 03/18/2017 0834    CBC    Component Value Date/Time   WBC 8.5 07/12/2019 0837   RBC 5.08 07/12/2019 0837   HGB 14.6 07/12/2019 0837   HGB 13.5 06/12/2019 1156   HGB 13.0 05/16/2018 1219   HCT 45.2 07/12/2019 0837   HCT 39.1 05/16/2018 1219   PLT 282 07/12/2019 0837   PLT 266 06/12/2019 1156   PLT 199 05/16/2018 1219   MCV 89.0 07/12/2019 0837   MCV 91 05/16/2018 1219   MCH 28.7 07/12/2019 0837   MCHC 32.3 07/12/2019 0837   RDW 12.3 07/12/2019 0837   RDW 12.1 (L) 05/16/2018 1219   LYMPHSABS 2.3 06/12/2019 1156   LYMPHSABS 1.0 05/16/2018 1219   MONOABS 1.0 06/12/2019 1156   EOSABS 0.1 06/12/2019 1156   EOSABS 0.0 05/16/2018 1219   BASOSABS 0.0 06/12/2019 1156   BASOSABS 0.1 05/16/2018 1219    ASSESSMENT AND PLAN:  1. Essential Hypertension: - amLODipine (NORVASC) 5 MG tablet; Take 1 tablet (5 mg total) by mouth daily.  Dispense: 30 tablet; Refill: 0 Return in 2 weeks for blood pressure check with clinical pharmacist and refill of medication. Return in 4 weeks with Dr. Margarita Rana or earlier if symptoms worsen.  Patient was given the opportunity to ask questions.  Patient verbalized understanding of the plan and was able to repeat key elements of the plan.   Requested Prescriptions   Signed Prescriptions Disp Refills  . amLODipine (NORVASC) 5 MG tablet 30 tablet 0    Sig: Take 1 tablet (5 mg total) by mouth daily.    Camillia Herter, NP

## 2019-08-10 NOTE — Progress Notes (Signed)
Patient presents for vaccination against influenza per orders of Dr. Johnson. Consent given. Counseling provided. No contraindications exists. Vaccine administered without incident.   

## 2019-08-10 NOTE — Patient Instructions (Addendum)
Amlodipine for blood pressure management. Return in 2 weeks for blood pressure check up.  Hypertension, Adult Hypertension is another name for high blood pressure. High blood pressure forces your heart to work harder to pump blood. This can cause problems over time. There are two numbers in a blood pressure reading. There is a top number (systolic) over a bottom number (diastolic). It is best to have a blood pressure that is below 120/80. Healthy choices can help lower your blood pressure, or you may need medicine to help lower it. What are the causes? The cause of this condition is not known. Some conditions may be related to high blood pressure. What increases the risk?  Smoking.  Having type 2 diabetes mellitus, high cholesterol, or both.  Not getting enough exercise or physical activity.  Being overweight.  Having too much fat, sugar, calories, or salt (sodium) in your diet.  Drinking too much alcohol.  Having long-term (chronic) kidney disease.  Having a family history of high blood pressure.  Age. Risk increases with age.  Race. You may be at higher risk if you are African American.  Gender. Men are at higher risk than women before age 55. After age 10, women are at higher risk than men.  Having obstructive sleep apnea.  Stress. What are the signs or symptoms?  High blood pressure may not cause symptoms. Very high blood pressure (hypertensive crisis) may cause: ? Headache. ? Feelings of worry or nervousness (anxiety). ? Shortness of breath. ? Nosebleed. ? A feeling of being sick to your stomach (nausea). ? Throwing up (vomiting). ? Changes in how you see. ? Very bad chest pain. ? Seizures. How is this treated?  This condition is treated by making healthy lifestyle changes, such as: ? Eating healthy foods. ? Exercising more. ? Drinking less alcohol.  Your health care provider may prescribe medicine if lifestyle changes are not enough to get your blood  pressure under control, and if: ? Your top number is above 130. ? Your bottom number is above 80.  Your personal target blood pressure may vary. Follow these instructions at home: Eating and drinking   If told, follow the DASH eating plan. To follow this plan: ? Fill one half of your plate at each meal with fruits and vegetables. ? Fill one fourth of your plate at each meal with whole grains. Whole grains include whole-wheat pasta, brown rice, and whole-grain bread. ? Eat or drink low-fat dairy products, such as skim milk or low-fat yogurt. ? Fill one fourth of your plate at each meal with low-fat (lean) proteins. Low-fat proteins include fish, chicken without skin, eggs, beans, and tofu. ? Avoid fatty meat, cured and processed meat, or chicken with skin. ? Avoid pre-made or processed food.  Eat less than 1,500 mg of salt each day.  Do not drink alcohol if: ? Your doctor tells you not to drink. ? You are pregnant, may be pregnant, or are planning to become pregnant.  If you drink alcohol: ? Limit how much you use to:  0-1 drink a day for women.  0-2 drinks a day for men. ? Be aware of how much alcohol is in your drink. In the [place] one drink equals one 12 oz bottle of beer (355 mL), one 5 oz glass of wine (148 mL), or one 1 oz glass of hard liquor (44 mL). Lifestyle   Work with your doctor to stay at a healthy weight or to lose weight. Ask your doctor what  the best weight is for you.  Get at least 30 minutes of exercise most days of the week. This may include walking, swimming, or biking.  Get at least 30 minutes of exercise that strengthens your muscles (resistance exercise) at least 3 days a week. This may include lifting weights or doing Pilates.  Do not use any products that contain nicotine or tobacco, such as cigarettes, e-cigarettes, and chewing tobacco. If you need help quitting, ask your doctor.  Check your blood pressure at home as told by your doctor.  Keep all  follow-up visits as told by your doctor. This is important. Medicines  Take over-the-counter and prescription medicines only as told by your doctor. Follow directions carefully.  Do not skip doses of blood pressure medicine. The medicine does not work as well if you skip doses. Skipping doses also puts you at risk for problems.  Ask your doctor about side effects or reactions to medicines that you should watch for. Contact a doctor if you:  Think you are having a reaction to the medicine you are taking.  Have headaches that keep coming back (recurring).  Feel dizzy.  Have swelling in your ankles.  Have trouble with your vision. Get help right away if you:  Get a very bad headache.  Start to feel mixed up (confused).  Feel weak or numb.  Feel faint.  Have very bad pain in your: ? Chest. ? Belly (abdomen).  Throw up more than once.  Have trouble breathing. Summary  Hypertension is another name for high blood pressure.  High blood pressure forces your heart to work harder to pump blood.  For most people, a normal blood pressure is less than 120/80.  Making healthy choices can help lower blood pressure. If your blood pressure does not get lower with healthy choices, you may need to take medicine. This information is not intended to replace advice given to you by your health care provider. Make sure you discuss any questions you have with your health care provider. Document Revised: 03/30/2018 Document Reviewed: 03/30/2018 Elsevier Patient Education  [place].  Influenza Virus Vaccine injection (Fluarix) What is this medicine? INFLUENZA VIRUS VACCINE (in floo EN zuh VAHY ruhs vak SEEN) helps to reduce the risk of getting influenza also known as the flu. This medicine may be used for other purposes; ask your health care provider or pharmacist if you have questions. COMMON BRAND NAME(S): Fluarix, Fluzone What should I tell my health care provider before I take  this medicine? They need to know if you have any of these conditions:  bleeding disorder like hemophilia  fever or infection  Guillain-Barre syndrome or other neurological problems  immune system problems  infection with the human immunodeficiency virus (HIV) or AIDS  low blood platelet counts  multiple sclerosis  an unusual or allergic reaction to influenza virus vaccine, eggs, chicken proteins, latex, gentamicin, other medicines, foods, dyes or preservatives  pregnant or trying to get pregnant  breast-feeding How should I use this medicine? This vaccine is for injection into a muscle. It is given by a health care professional. A copy of Vaccine Information Statements will be given before each vaccination. Read this sheet carefully each time. The sheet may change frequently. Talk to your pediatrician regarding the use of this medicine in children. Special care may be needed. Overdosage: If you think you have taken too much of this medicine contact a poison control center or emergency room at once. NOTE: This medicine is only  for you. Do not share this medicine with others. What if I miss a dose? This does not apply. What may interact with this medicine?  chemotherapy or radiation therapy  medicines that lower your immune system like etanercept, anakinra, infliximab, and adalimumab  medicines that treat or prevent blood clots like warfarin  phenytoin  steroid medicines like prednisone or cortisone  theophylline  vaccines This list may not describe all possible interactions. Give your health care provider a list of all the medicines, herbs, non-prescription drugs, or dietary supplements you use. Also tell them if you smoke, drink alcohol, or use illegal drugs. Some items may interact with your medicine. What should I watch for while using this medicine? Report any side effects that do not go away within 3 days to your doctor or health care professional. Call your health  care provider if any unusual symptoms occur within 6 weeks of receiving this vaccine. You may still catch the flu, but the illness is not usually as bad. You cannot get the flu from the vaccine. The vaccine will not protect against colds or other illnesses that may cause fever. The vaccine is needed every year. What side effects may I notice from receiving this medicine? Side effects that you should report to your doctor or health care professional as soon as possible:  allergic reactions like skin rash, itching or hives, swelling of the face, lips, or tongue Side effects that usually do not require medical attention (report to your doctor or health care professional if they continue or are bothersome):  fever  headache  muscle aches and pains  pain, tenderness, redness, or swelling at site where injected  weak or tired This list may not describe all possible side effects. Call your doctor for medical advice about side effects. You may report side effects to FDA at 1-800-FDA-1088. Where should I keep my medicine? This vaccine is only given in a clinic, pharmacy, doctor's office, or other health care setting and will not be stored at home. NOTE: This sheet is a summary. It may not cover all possible information. If you have questions about this medicine, talk to your doctor, pharmacist, or health care provider.  2020 Elsevier/Gold Standard (2008-02-15 09:30:40)

## 2019-08-11 ENCOUNTER — Ambulatory Visit: Payer: Medicare Other | Admitting: Pharmacist

## 2019-08-24 ENCOUNTER — Ambulatory Visit: Payer: Medicare Other | Attending: Family Medicine | Admitting: Pharmacist

## 2019-08-24 ENCOUNTER — Other Ambulatory Visit: Payer: Self-pay

## 2019-08-24 DIAGNOSIS — I10 Essential (primary) hypertension: Secondary | ICD-10-CM | POA: Diagnosis not present

## 2019-08-24 DIAGNOSIS — Z9111 Patient's noncompliance with dietary regimen: Secondary | ICD-10-CM | POA: Diagnosis not present

## 2019-08-24 DIAGNOSIS — F17210 Nicotine dependence, cigarettes, uncomplicated: Secondary | ICD-10-CM | POA: Diagnosis not present

## 2019-08-24 DIAGNOSIS — Z7901 Long term (current) use of anticoagulants: Secondary | ICD-10-CM | POA: Diagnosis not present

## 2019-08-24 MED ORDER — AMLODIPINE BESYLATE 5 MG PO TABS: 5.0000 mg | ORAL_TABLET | Freq: Every day | ORAL | 2 refills | Status: DC

## 2019-08-24 NOTE — Progress Notes (Signed)
   S:    PCP: Dr. Margarita Rana  Patient arrives in good spirits. Presents to the clinic for BP check. Patient was referred and last seen by Primary Care Provider on 08/10/2019. BP was elevated at that appointment but pt reported being without medication since Nov, 2020.   Patient reports adherence with medications.  Patient denies chest pains, palpitations, headache, and blurred vision. Patient denies BLE edema.   Current BP Medications include:  Amlodipine 5 mg   Dietary habits include: noncompliant with salt restriction; denies excessive caffeine intake Exercise habits include: walks daily; "I walk too much" Family / Social history:  - FHx: no pertinent positives - Tobacco: current 1 PPD smoker - Alcohol: 2-3 40 ox beers/day  O:  Vitals:   08/24/19 0945  BP: 134/89  Pulse: 89    Home BP readings: does not check  Last 3 Office BP readings: BP Readings from Last 3 Encounters:  08/24/19 134/89  08/10/19 (!) 167/92  06/12/19 138/90    BMET    Component Value Date/Time   NA 137 07/12/2019 0837   NA 136 05/16/2018 1219   K 4.9 07/12/2019 0837   CL 101 07/12/2019 0837   CO2 24 07/12/2019 0837   GLUCOSE 95 07/12/2019 0837   BUN 9 07/12/2019 0837   BUN 10 05/16/2018 1219   CREATININE 0.97 07/12/2019 0837   CALCIUM 10.1 07/12/2019 0837   GFRNONAA 81 07/12/2019 0837   GFRAA 94 07/12/2019 0837    Renal function: CrCl cannot be calculated (Patient's most recent lab result is older than the maximum 21 days allowed.).  Clinical ASCVD: No  The 10-year ASCVD risk score Mikey Bussing DC Jr., et al., 2013) is: 24.8%   Values used to calculate the score:     Age: 44 years     Sex: Male     Is Non-Hispanic African American: Yes     Diabetic: No     Tobacco smoker: Yes     Systolic Blood Pressure: [id number] mmHg     Is BP treated: Yes     HDL Cholesterol: 72 mg/dL     Total Cholesterol: 156 mg/dL   A/P: Hypertension longstanding currently above goal but improved since restarting  amlodipine. BP Goal <130/80 mmHg. Patient reports adherence with amlodipine.  -Continued amlodipine 5 mg daily.  -Counseled on lifestyle modifications for blood pressure control including reduced dietary sodium, increased exercise, adequate sleep  Results reviewed and written information provided.   Total time in face-to-face counseling 15 minutes.   F/U Clinic Visit with PCP.   Benard Halsted, PharmD, [place] [phone]

## 2019-09-13 MED FILL — AMLODIPINE BESYLATE 5 MG TA: 5 | 30 days supply | Qty: 30 | Fill #0

## 2019-09-19 ENCOUNTER — Other Ambulatory Visit: Payer: Self-pay | Admitting: Interventional Radiology

## 2019-09-19 DIAGNOSIS — C220 Liver cell carcinoma: Secondary | ICD-10-CM

## 2019-10-12 ENCOUNTER — Ambulatory Visit (HOSPITAL_COMMUNITY)
Admission: RE | Admit: 2019-10-12 | Discharge: 2019-10-12 | Disposition: A | Discharge status: Home / Self Care | Payer: Medicare Other | Source: Ambulatory Visit | Attending: Interventional Radiology | Admitting: Interventional Radiology

## 2019-10-12 ENCOUNTER — Other Ambulatory Visit: Payer: Self-pay

## 2019-10-12 DIAGNOSIS — C220 Liver cell carcinoma: Secondary | ICD-10-CM | POA: Diagnosis not present

## 2019-10-12 LAB — POCT I-STAT CREATININE: Creatinine, Ser: 0.8 mg/dL (ref 0.61–1.24)

## 2019-10-12 MED ORDER — GADOBUTROL 1 MMOL/ML IV SOLN
6.0000 mL | Freq: Once | INTRAVENOUS | Status: AC | PRN
  Administered 2019-10-12: 6 mL via INTRAVENOUS

## 2019-10-17 ENCOUNTER — Other Ambulatory Visit: Payer: Self-pay

## 2019-10-17 ENCOUNTER — Ambulatory Visit
Admission: RE | Admit: 2019-10-17 | Discharge: 2019-10-17 | Disposition: A | Discharge status: Home / Self Care | Payer: Medicare Other | Source: Ambulatory Visit | Attending: Interventional Radiology | Admitting: Interventional Radiology

## 2019-10-17 ENCOUNTER — Encounter: Payer: Self-pay | Admitting: *Deleted

## 2019-10-17 DIAGNOSIS — C220 Liver cell carcinoma: Secondary | ICD-10-CM

## 2019-10-17 HISTORY — PX: IR RADIOLOGIST EVAL & MGMT: IMG5224

## 2019-10-17 NOTE — Progress Notes (Signed)
Chief Complaint: Patient was seen in follow-up remotely today ([place]) for hepatocellular carcinoma at the request of Calvin Gardner.    Referring Physician(s): Calvin Klein, MD  History of Present Illness: Calvin Gardner is a 69 y.o. male with a history ofHCV and EtOH cirrhosis complicated by development of a large 7.4 cm partially exophytic mass arising from the inferior aspect of hepatic segment 6. MR imaging demonstrates Li-RADS 5imagingfeatures diagnostic of hepatocellular carcinoma.  He was deemed not to be a surgical candidate and therefore underwent liver directed therapy with drug-eluting bead chemoembolization on 07/19/2018.  Surveillance MRI imaging was performed on 10/12/2019.  Excellent continued response to therapy with further involution of the treated segment 6 mass.  There has been a nearly 50% reduction in lesion volume.    [name] continues to do very well. He has no active complaints at this time and denies abdominal pain, fever, chills, nausea, vomiting or other new or systemic symptoms. He was very pleased to hear the results of his MRI.     Past Medical History:  Diagnosis Date  . Alcoholism ([place])   . Aortic atherosclerosis ([place])   . Arthritis   . Cancer ([place])    liver  . Cataract   . Hepatitis C   . HLD (hyperlipidemia)   . Hypertension   . Liver mass   . Pulmonary nodule     Past Surgical History:  Procedure Laterality Date  . COLONOSCOPY WITH PROPOFOL N/A 01/30/2014   Procedure: COLONOSCOPY WITH PROPOFOL;  Surgeon: Calvin Fair, MD;  Location: [facility];  Service: Endoscopy;  Laterality: N/A;  . ESOPHAGOGASTRODUODENOSCOPY (EGD) WITH PROPOFOL N/A 01/30/2014   Procedure: ESOPHAGOGASTRODUODENOSCOPY (EGD) WITH PROPOFOL;  Surgeon: Calvin Fair, MD;  Location: [facility];  Service: Endoscopy;  Laterality: N/A;  . IR ANGIOGRAM SELECTIVE EACH ADDITIONAL VESSEL  07/19/2018  . IR ANGIOGRAM SELECTIVE EACH ADDITIONAL  VESSEL  07/19/2018  . IR ANGIOGRAM SELECTIVE EACH ADDITIONAL VESSEL  07/19/2018  . IR ANGIOGRAM SELECTIVE EACH ADDITIONAL VESSEL  07/19/2018  . IR ANGIOGRAM SELECTIVE EACH ADDITIONAL VESSEL  07/19/2018  . IR ANGIOGRAM VISCERAL SELECTIVE  07/19/2018  . IR EMBO TUMOR ORGAN ISCHEMIA INFARCT INC GUIDE ROADMAPPING  07/19/2018  . IR RADIOLOGIST EVAL & MGMT  07/07/2018  . IR RADIOLOGIST EVAL & MGMT  08/16/2018  . IR RADIOLOGIST EVAL & MGMT  01/31/2019  . IR RADIOLOGIST EVAL & MGMT  04/25/2019  . IR RADIOLOGIST EVAL & MGMT  07/20/2019  . IR US GUIDE VASC ACCESS LEFT  07/19/2018  . left jaw surgery     . MANDIBLE FRACTURE SURGERY      Allergies: Patient has no known allergies.  Medications: Prior to Admission medications   Medication Sig Start Date End Date Taking? Authorizing Provider  amLODipine (NORVASC) 5 MG tablet Take 1 tablet (5 mg total) by mouth daily. 08/24/19 11/22/19  Calvin Rakes, MD  aspirin EC 81 MG tablet Take 81 mg by mouth every morning. Reported on 12/19/2015    [provider]  omeprazole (PRILOSEC) 40 MG capsule TAKE 1 CAPSULE (40 MG TOTAL) BY MOUTH DAILY. 05/17/19   Calvin Park, MD     Family History  Problem Relation Age of Onset  . Cancer Mother        type unknown  . Emphysema Brother   . Colon cancer Neg Hx   . Esophageal cancer Neg Hx   . Pancreatic cancer Neg Hx     Social History   Socioeconomic History  .  Marital status: Single    Spouse name: Not on file  . Number of children: 1  . Years of education: Not on file  . Highest education level: Not on file  Occupational History  . Occupation: works in Museum/gallery conservator  . Smoking status: Current Some Day Smoker    Packs/day: 1.00    Years: 50.00    Pack years: 50.00    Types: Cigarettes  . Smokeless tobacco: Never Used  . Tobacco comment: trying to quit, smoke every other day  Substance and Sexual Activity  . Alcohol use: Yes    Alcohol/week: 0.0 standard drinks    Comment:  2-3 40 oz beers per day, x50 years  . Drug use: No    Comment: remote IV drug use age 83   . Sexual activity: Yes    Partners: Female, Male    Comment: patient declines  Other Topics Concern  . Not on file  Social History Narrative  . Not on file   Social Determinants of Health   Financial Resource Strain:   . Difficulty of Paying Living Expenses:   Food Insecurity:   . Worried About Charity fundraiser in the Last Year:   . Arboriculturist in the Last Year:   Transportation Needs:   . Film/video editor (Medical):   Marland Kitchen Lack of Transportation (Non-Medical):   Physical Activity:   . Days of Exercise per Week:   . Minutes of Exercise per Session:   Stress:   . Feeling of Stress :   Social Connections:   . Frequency of Communication with Friends and Family:   . Frequency of Social Gatherings with Friends and Family:   . Attends Religious Services:   . Active Member of Clubs or Organizations:   . Attends Archivist Meetings:   Marland Kitchen Marital Status:     ECOG Status: 0 - Asymptomatic  Review of Systems  Review of Systems: A 12 point ROS discussed and pertinent positives are indicated in the HPI above.  All other systems are negative.  Physical Exam No direct physical exam was performed (except for noted visual exam findings with Video Visits).   Vital Signs: There were no vitals taken for this visit.  Imaging: MR ABDOMEN WWO CONTRAST  Result Date: 10/12/2019 CLINICAL DATA:  Hepatocellular carcinoma follow up EXAM: MRI ABDOMEN WITHOUT AND WITH CONTRAST TECHNIQUE: Multiplanar multisequence MR imaging of the abdomen was performed both before and after the administration of intravenous contrast. CONTRAST:  66mL GADAVIST GADOBUTROL 1 MMOL/ML IV SOLN COMPARISON:  07/18/2019 FINDINGS: Lower chest: Unremarkable Hepatobiliary: The treated mass primarily in segment 5 of some possible involvement of segment 6 measures 3.8 by 3.7 by 3.7 cm (volume = 27 cm^3) on image 45/24,  and by my measurements was previously 4.5 by 4.6 by 4.6 cm (volume = 50 cm^3). This constitutes continued size improvement. No convincing central internal enhancement although the rims are questionable but probably from mild misregistration. Adjacent to the IVC near the base of the caudate on arterial phase images there is some questionable accentuated enhancement measuring 2.3 by 1.0 cm. However, this series has other areas of clear motion artifact and this may be artifactual in nature rather than a true lesion. I do not see any lesion at this site on any of the other sequences. Would suggest attention to this area in the future. Pancreas:  Unremarkable Spleen:  Unremarkable Adrenals/Urinary Tract: Small right kidney upper pole cyst. Adrenal glands  unremarkable. Stomach/Bowel: Descending colon diverticulosis. Vascular/Lymphatic: Aortoiliac atherosclerotic vascular disease. No pathologic adenopathy is identified. Other:  No supplemental non-categorized findings. Musculoskeletal: Transitional L5 vertebra with degenerative endplate findings at 075-GRM. IMPRESSION: 1. Reduced size of the dominant right hepatic lobe mass, previously 50 cubic cm and currently 27 cubic cm in size. No definite enhancement. 2. Ill-defined accentuated signal near the base of the caudate lobe on arterial phase images is probably artifactual. The finding is not mirrored on any other sequences. Surveillance of this region is recommended on follow up studies. 3. Other imaging findings of potential clinical significance: Aortic Atherosclerosis (ICD10-I70.0). Descending colon diverticulosis. Electronically Signed   By: Van Clines M.D.   On: 10/12/2019 13:30    Labs:  CBC: Recent Labs    01/18/19 0852 04/17/19 0824 06/12/19 1156 07/12/19 0837  WBC 8.0 8.8 8.8 8.5  HGB 14.4 14.4 13.5 14.6  HCT 44.5 44.5 41.3 45.2  PLT 282 312 266 282    COAGS: Recent Labs    01/18/19 0852 04/17/19 0824 07/12/19 0837  INR 1.0 1.0 1.0     BMP: Recent Labs    01/18/19 0852 01/18/19 0852 04/17/19 0824 06/12/19 1156 07/12/19 0837 10/12/19 1123  NA 136  --  140 140 137  --   K 4.7  --  4.8 4.1 4.9  --   CL 103  --  105 104 101  --   CO2 24  --  26 25 24   --   GLUCOSE 162*  --  53* 92 95  --   BUN 9  --  11 11 9   --   CALCIUM 10.6*  --  10.1 9.5 10.1  --   CREATININE 0.80   < > 0.98 0.96 0.97 0.80  GFRNONAA 93  --  80 >60 81  --   GFRAA 108  --  93 >60 94  --    < > = values in this interval not displayed.    LIVER FUNCTION TESTS: Recent Labs    01/18/19 0852 04/17/19 0824 06/12/19 1156 07/12/19 0837  BILITOT 0.4 0.2 0.4 0.5  AST 23 25 22 22   ALT 7* 19 9 9   ALKPHOS  --   --  75  --   PROT 7.4 7.5 7.8 7.5  ALBUMIN  --   --  4.2  --     TUMOR MARKERS: Recent Labs    01/18/19 0852 04/17/19 0824 07/12/19 0837  AFPTM 1.6 1.7 1.4    Assessment and Plan:  69 year old gentleman who continues to do extremely well now 1 year 3 months status post drug-eluting bead chemoembolization of his large segment 6 hepatocellular carcinoma.  No evidence of residual, recurrent or new disease by surveillance imaging.  We will continue serial surveillance every 3 months through December 2021.  If there is no evidence of recurrent disease at that time, we can extend surveillance intervals to every 6 months.  1.)  Repeat MRI of the abdomen with gadolinium contrast and clinic visit in 3 months.   Electronically Signed: Jacqulynn Cadet 10/17/2019, 11:16 AM   I spent a total of  10 Minutes in remote  clinical consultation, greater than 50% of which was counseling/coordinating care for hepatocellular cancer.    Visit type: Audio only (telephone). Audio (no video) only due to patient request. Alternative for in-person consultation at [facility], [address], [place], [place]. This visit type was conducted due to national recommendations for restrictions regarding the COVID-19 Pandemic (e.g. social  distancing).  This format is felt to be most appropriate for this patient at this time.  All issues noted in this document were discussed and addressed.

## 2019-10-31 ENCOUNTER — Ambulatory Visit (INDEPENDENT_AMBULATORY_CARE_PROVIDER_SITE_OTHER): Payer: Medicare Other

## 2019-10-31 ENCOUNTER — Ambulatory Visit (HOSPITAL_COMMUNITY)
Admission: EM | Admit: 2019-10-31 | Discharge: 2019-10-31 | Disposition: A | Discharge status: Home / Self Care | Payer: Medicare Other | Attending: Family Medicine | Admitting: Family Medicine

## 2019-10-31 ENCOUNTER — Other Ambulatory Visit: Payer: Self-pay

## 2019-10-31 ENCOUNTER — Encounter (HOSPITAL_COMMUNITY): Payer: Self-pay

## 2019-10-31 DIAGNOSIS — M25462 Effusion, left knee: Secondary | ICD-10-CM | POA: Diagnosis not present

## 2019-10-31 DIAGNOSIS — M25562 Pain in left knee: Secondary | ICD-10-CM | POA: Diagnosis not present

## 2019-10-31 DIAGNOSIS — S82142A Displaced bicondylar fracture of left tibia, initial encounter for closed fracture: Secondary | ICD-10-CM

## 2019-10-31 MED ORDER — TRAMADOL HCL 50 MG PO TABS: 50.0000 mg | ORAL_TABLET | Freq: Four times a day (QID) | ORAL | 0 refills | Status: DC | PRN

## 2019-10-31 NOTE — ED Triage Notes (Signed)
Pt presents with left knee pain after hitting it on a coffee table a week ago.

## 2019-10-31 NOTE — ED Provider Notes (Addendum)
[place]    CSN: [id number] Arrival date & time: 10/31/19  0847      History   Chief Complaint Chief Complaint  Patient presents with  . Knee Pain    HPI Calvin Gardner is a 69 y.o. male.   Patient is a 69 year old male who presents today with left knee pain.  This started after hitting on a coffee table approximate 1 week ago.  Reporting initially had significant swelling but that has somewhat decreased.  He still having a lot of anterior and posterior knee pain.  He has not taken anything for the pain.  He has been using his crutches he had at home to stay off the knee due to pain with ambulation.  Denies any numbness, tingling, weakness or radiation of pain.  ROS per HPI      Past Medical History:  Diagnosis Date  . Alcoholism ([place])   . Aortic atherosclerosis ([place])   . Arthritis   . Cancer ([place])    liver  . Cataract   . Hepatitis C   . HLD (hyperlipidemia)   . Hypertension   . Liver mass   . Pulmonary nodule     Patient Active Problem List   Diagnosis Date Noted  . Hepatocellular carcinoma ([place]) 06/09/2018  . Pulmonary nodule 05/25/2018  . GERD (gastroesophageal reflux disease) 01/10/2018  . Hiccough 10/07/2017  . Alcohol abuse 10/07/2017  . Chest pain 10/29/2015  . Liver fibrosis ([place]) 10/23/2014  . Chronic hepatitis C without hepatic coma ([place]) 09/19/2014  . IFG (impaired fasting glucose) 04/02/2014  . Vitamin D deficiency 04/02/2014  . Abnormal LFTs 04/02/2014  . Cataract, right eye 01/05/2014  . Essential hypertension, benign 01/05/2014  . Smoking 01/05/2014    Past Surgical History:  Procedure Laterality Date  . COLONOSCOPY WITH PROPOFOL N/A 01/30/2014   Procedure: COLONOSCOPY WITH PROPOFOL;  Surgeon: Garlan Fair, MD;  Location: [facility];  Service: Endoscopy;  Laterality: N/A;  . ESOPHAGOGASTRODUODENOSCOPY (EGD) WITH PROPOFOL N/A 01/30/2014   Procedure: ESOPHAGOGASTRODUODENOSCOPY (EGD) WITH PROPOFOL;  Surgeon: Garlan Fair, MD;  Location: [facility];  Service: Endoscopy;  Laterality: N/A;  . IR ANGIOGRAM SELECTIVE EACH ADDITIONAL VESSEL  07/19/2018  . IR ANGIOGRAM SELECTIVE EACH ADDITIONAL VESSEL  07/19/2018  . IR ANGIOGRAM SELECTIVE EACH ADDITIONAL VESSEL  07/19/2018  . IR ANGIOGRAM SELECTIVE EACH ADDITIONAL VESSEL  07/19/2018  . IR ANGIOGRAM SELECTIVE EACH ADDITIONAL VESSEL  07/19/2018  . IR ANGIOGRAM VISCERAL SELECTIVE  07/19/2018  . IR EMBO TUMOR ORGAN ISCHEMIA INFARCT INC GUIDE ROADMAPPING  07/19/2018  . IR RADIOLOGIST EVAL & MGMT  07/07/2018  . IR RADIOLOGIST EVAL & MGMT  08/16/2018  . IR RADIOLOGIST EVAL & MGMT  01/31/2019  . IR RADIOLOGIST EVAL & MGMT  04/25/2019  . IR RADIOLOGIST EVAL & MGMT  07/20/2019  . IR RADIOLOGIST EVAL & MGMT  10/17/2019  . IR US GUIDE VASC ACCESS LEFT  07/19/2018  . left jaw surgery     . MANDIBLE FRACTURE SURGERY         Home Medications    Prior to Admission medications   Medication Sig Start Date End Date Taking? Authorizing Provider  amLODipine (NORVASC) 5 MG tablet Take 1 tablet (5 mg total) by mouth daily. 08/24/19 11/22/19  Charlott Rakes, MD  aspirin EC 81 MG tablet Take 81 mg by mouth every morning. Reported on 12/19/2015    [provider]  omeprazole (PRILOSEC) 40 MG capsule TAKE 1 CAPSULE (40 MG TOTAL) BY  MOUTH DAILY. 05/17/19   Thornton Park, MD  traMADol (ULTRAM) 50 MG tablet Take 1 tablet (50 mg total) by mouth every 6 (six) hours as needed. 10/31/19   Orvan July, NP    Family History Family History  Problem Relation Age of Onset  . Cancer Mother        type unknown  . Emphysema Brother   . Colon cancer Neg Hx   . Esophageal cancer Neg Hx   . Pancreatic cancer Neg Hx     Social History Social History   Tobacco Use  . Smoking status: Current Some Day Smoker    Packs/day: 1.00    Years: 50.00    Pack years: 50.00    Types: Cigarettes  . Smokeless tobacco: Never Used  . Tobacco comment: trying to quit, smoke every other  day  Substance Use Topics  . Alcohol use: Yes    Alcohol/week: 0.0 standard drinks    Comment: 2-3 40 oz beers per day, x50 years  . Drug use: No    Comment: remote IV drug use age 38      Allergies   Patient has no known allergies.   Review of Systems Review of Systems   Physical Exam Triage Vital Signs ED Triage Vitals  Enc Vitals Group     BP 10/31/19 0857 (!) 157/97     Pulse Rate 10/31/19 0857 (!) 104     Resp 10/31/19 0857 18     Temp 10/31/19 0857 98.2 F (36.8 C)     Temp Source 10/31/19 0857 Oral     SpO2 10/31/19 0857 98 %     Weight --      Height --      Head Circumference --      Peak Flow --      Pain Score 10/31/19 0859 7     Pain Loc --      Pain Edu? --      Excl. in [place]? --    No data found.  Updated Vital Signs BP (!) 157/97 (BP Location: Left Arm)   Pulse (!) 104   Temp 98.2 F (36.8 C) (Oral)   Resp 18   SpO2 98%   Visual Acuity Right Eye Distance:   Left Eye Distance:   Bilateral Distance:    Right Eye Near:   Left Eye Near:    Bilateral Near:     Physical Exam Vitals and nursing note reviewed.  Constitutional:      Appearance: Normal appearance.  HENT:     Head: Normocephalic and atraumatic.     Nose: Nose normal.  Eyes:     Conjunctiva/sclera: Conjunctivae normal.  Pulmonary:     Effort: Pulmonary effort is normal.  Musculoskeletal:     Cervical back: Normal range of motion.     Left knee: Swelling, effusion and bony tenderness present. Decreased range of motion. Tenderness present over the medial joint line and lateral joint line.     Comments: Some posterior knee pain.  No calf  pain or swelling .  Skin:    General: Skin is warm and dry.  Neurological:     Mental Status: He is alert.  Psychiatric:        Mood and Affect: Mood normal.      UC Treatments / Results  Labs (all labs ordered are listed, but only abnormal results are displayed) Labs Reviewed - No data to display  EKG   Radiology DG Knee  Complete  4 Views Left  Result Date: 10/31/2019 CLINICAL DATA:  Left knee pain and swelling for 1 week EXAM: LEFT KNEE - COMPLETE 4+ VIEW COMPARISON:  None. FINDINGS: Cortical lucency at the tibial eminence suspicious for nondisplaced fracture. Additional minimally displaced 3 mm osseous fragment adjacent to the posterolateral aspect of the lateral tibial plateau. Moderate-sized knee joint effusion. Joint spaces are relatively preserved. Scattered vascular calcifications. IMPRESSION: 1. Cortical lucency at the tibial eminence suspicious for nondisplaced fracture. 2. Additional tiny minimally displaced fracture fragment adjacent to the posterolateral aspect of the lateral tibial plateau. 3. Moderate knee joint effusion. These results will be called to the ordering clinician or representative by the Radiologist Assistant, and communication documented in the PACS or [employer]. Electronically Signed   By: Davina Poke D.O.   On: 10/31/2019 09:53    Procedures Procedures (including critical care time)  Medications Ordered in UC Medications - No data to display  Initial Impression / Assessment and Plan / UC Course  I have reviewed the triage vital signs and the nursing notes.  Pertinent labs & imaging results that were available during my care of the patient were reviewed by me and considered in my medical decision making (see chart for details).     Closed fracture of the left tibial plateau with knee effusion. Spoke with orthopedic on-call and recommended place patient in knee immobilizer and be nonweightbearing and follow-up with orthopedic specialist this week. Knee immobilizer placed here in clinic.  Patient already has a set of crutches and is on the use. He came in with those today. Tramadol as needed for pain Rest, ice, elevate Final Clinical Impressions(s) / UC Diagnoses   Final diagnoses:  Closed fracture of left tibial plateau, initial encounter  Knee effusion, left      Discharge Instructions     You have a fracture.  We are going to place you in a knee immobilizer and have you nonweightbearing with crutches. You need to follow-up with orthopedics this week.  Call them today to schedule an appointment    ED Prescriptions    Medication Sig Dispense Auth. Provider   traMADol (ULTRAM) 50 MG tablet Take 1 tablet (50 mg total) by mouth every 6 (six) hours as needed. 12 tablet Jerine Surles A, NP     I have reviewed the PDMP during this encounter.   Orvan July, NP 10/31/19 1100    Loura Halt A, NP 10/31/19 1100

## 2019-10-31 NOTE — Discharge Instructions (Signed)
You have a fracture.  We are going to place you in a knee immobilizer and have you nonweightbearing with crutches. You need to follow-up with orthopedics this week.  Call them today to schedule an appointment

## 2019-12-04 ENCOUNTER — Other Ambulatory Visit: Payer: Self-pay | Admitting: Interventional Radiology

## 2019-12-04 DIAGNOSIS — C220 Liver cell carcinoma: Secondary | ICD-10-CM

## 2019-12-19 DIAGNOSIS — Z23 Encounter for immunization: Secondary | ICD-10-CM | POA: Diagnosis not present

## 2019-12-25 MED FILL — AMLODIPINE BESYLATE 5 MG TA: 5 | 30 days supply | Qty: 30 | Fill #0

## 2020-01-04 ENCOUNTER — Other Ambulatory Visit: Payer: Self-pay | Admitting: *Deleted

## 2020-01-04 DIAGNOSIS — C220 Liver cell carcinoma: Secondary | ICD-10-CM

## 2020-01-15 ENCOUNTER — Other Ambulatory Visit (HOSPITAL_COMMUNITY)
Admission: RE | Admit: 2020-01-15 | Discharge: 2020-01-15 | Disposition: A | Discharge status: Home / Self Care | Payer: Medicare Other | Source: Ambulatory Visit | Attending: Interventional Radiology | Admitting: Interventional Radiology

## 2020-01-15 DIAGNOSIS — C220 Liver cell carcinoma: Secondary | ICD-10-CM | POA: Insufficient documentation

## 2020-01-15 LAB — CBC
HCT: 48.2 % (ref 39.0–52.0)
Hemoglobin: 15.4 g/dL (ref 13.0–17.0)
MCH: 29.5 pg (ref 26.0–34.0)
MCHC: 32 g/dL (ref 30.0–36.0)
MCV: 92.3 fL (ref 80.0–100.0)
Platelets: 276 10*3/uL (ref 150–400)
RBC: 5.22 MIL/uL (ref 4.22–5.81)
RDW: 14.4 % (ref 11.5–15.5)
WBC: 9.3 10*3/uL (ref 4.0–10.5)
nRBC: 0 % (ref 0.0–0.2)

## 2020-01-15 LAB — PROTIME-INR
INR: 0.9 (ref 0.8–1.2)
Prothrombin Time: 11.4 seconds (ref 11.4–15.2)

## 2020-01-15 LAB — COMPREHENSIVE METABOLIC PANEL
ALT: 14 U/L (ref 0–44)
AST: 24 U/L (ref 15–41)
Albumin: 4.3 g/dL (ref 3.5–5.0)
Alkaline Phosphatase: 58 U/L (ref 38–126)
Anion gap: 9 (ref 5–15)
BUN: 6 mg/dL — ABNORMAL LOW (ref 8–23)
CO2: 27 mmol/L (ref 22–32)
Calcium: 10.1 mg/dL (ref 8.9–10.3)
Chloride: 102 mmol/L (ref 98–111)
Creatinine, Ser: 0.7 mg/dL (ref 0.61–1.24)
GFR calc Af Amer: >60 mL/min (ref >60)
GFR calc non Af Amer: >60 mL/min (ref >60)
Glucose, Bld: 113 mg/dL — ABNORMAL HIGH (ref 70–99)
Potassium: 5.4 mmol/L — ABNORMAL HIGH (ref 3.5–5.1)
Sodium: 138 mmol/L (ref 135–145)
Total Bilirubin: 0.5 mg/dL (ref 0.3–1.2)
Total Protein: 7.9 g/dL (ref 6.5–8.1)

## 2020-01-16 DIAGNOSIS — Z23 Encounter for immunization: Secondary | ICD-10-CM | POA: Diagnosis not present

## 2020-01-16 LAB — AFP TUMOR MARKER: AFP, Serum, Tumor Marker: 0.9 ng/mL (ref 0.0–8.3)

## 2020-01-26 ENCOUNTER — Other Ambulatory Visit: Payer: Self-pay

## 2020-01-26 ENCOUNTER — Ambulatory Visit (HOSPITAL_COMMUNITY)
Admission: RE | Admit: 2020-01-26 | Discharge: 2020-01-26 | Disposition: A | Discharge status: Home / Self Care | Payer: Medicare Other | Source: Ambulatory Visit | Attending: Interventional Radiology | Admitting: Interventional Radiology

## 2020-01-26 DIAGNOSIS — Z5111 Encounter for antineoplastic chemotherapy: Secondary | ICD-10-CM | POA: Diagnosis not present

## 2020-01-26 DIAGNOSIS — C220 Liver cell carcinoma: Secondary | ICD-10-CM | POA: Insufficient documentation

## 2020-01-26 MED ORDER — GADOBUTROL 1 MMOL/ML IV SOLN
6.0000 mL | Freq: Once | INTRAVENOUS | Status: AC | PRN
  Administered 2020-01-26: 6 mL via INTRAVENOUS

## 2020-01-29 MED FILL — AMLODIPINE BESYLATE 5 MG TA: 5 | 30 days supply | Qty: 30 | Fill #1

## 2020-01-30 ENCOUNTER — Other Ambulatory Visit: Payer: Self-pay

## 2020-01-30 ENCOUNTER — Ambulatory Visit
Admission: RE | Admit: 2020-01-30 | Discharge: 2020-01-30 | Disposition: A | Discharge status: Home / Self Care | Payer: Medicare Other | Source: Ambulatory Visit | Attending: Interventional Radiology | Admitting: Interventional Radiology

## 2020-01-30 ENCOUNTER — Encounter: Payer: Self-pay | Admitting: *Deleted

## 2020-01-30 DIAGNOSIS — C220 Liver cell carcinoma: Secondary | ICD-10-CM | POA: Diagnosis not present

## 2020-01-30 HISTORY — PX: IR RADIOLOGIST EVAL & MGMT: IMG5224

## 2020-01-30 NOTE — Progress Notes (Signed)
Chief Complaint: Patient was consulted remotely today (TeleHealth) for hepatocellular cancer at the request of Helen.    Referring Physician(s): Stark Klein  History of Present Illness: Calvin Gardner is a 69 y.o. male  with a history ofHCV and EtOH cirrhosis complicated by development of a large 7.4 cm partially exophytic mass arising from the inferior aspect of hepatic segment 6. MR imaging demonstrates Li-RADS 5imagingfeatures diagnostic of hepatocellular carcinoma.  He was deemed not to be a surgical candidate and therefore underwent liver directed therapy with drug-eluting bead chemoembolization on 07/19/2018.  Surveillance MRI imaging was performed on 10/12/2019.  Excellent continued response to therapy with further involution of the treated segment 6 mass.  There has been a nearly 50% reduction in lesion volume.    MRI imaging dated 01/26/2020 demonstrates a further reduction in volume of the previously treated lesion.  No evidence of residual or recurrent disease.  A small 6 mm focus of subcapsular enhancement is present peripherally and may represent a benign vascular phenomenon or regenerating nodule.  Mr. Hockettcontinues to do verywell. He has no active complaints at this time and denies abdominal pain, fever, chills, nausea, vomiting or other new or systemic symptoms. He was very pleased to hear the results of his MRI.  Past Medical History:  Diagnosis Date  . Alcoholism ([place])   . Aortic atherosclerosis ([place])   . Arthritis   . Cancer ([place])    liver  . Cataract   . Hepatitis C   . HLD (hyperlipidemia)   . Hypertension   . Liver mass   . Pulmonary nodule     Past Surgical History:  Procedure Laterality Date  . COLONOSCOPY WITH PROPOFOL N/A 01/30/2014   Procedure: COLONOSCOPY WITH PROPOFOL;  Surgeon: Garlan Fair, MD;  [facility];  Service: Endoscopy;  Laterality: N/A;  . ESOPHAGOGASTRODUODENOSCOPY (EGD) WITH  PROPOFOL N/A 01/30/2014   Procedure: ESOPHAGOGASTRODUODENOSCOPY (EGD) WITH PROPOFOL;  Surgeon: Garlan Fair, MD;  [facility];  Service: Endoscopy;  Laterality: N/A;  . IR ANGIOGRAM SELECTIVE EACH ADDITIONAL VESSEL  07/19/2018  . IR ANGIOGRAM SELECTIVE EACH ADDITIONAL VESSEL  07/19/2018  . IR ANGIOGRAM SELECTIVE EACH ADDITIONAL VESSEL  07/19/2018  . IR ANGIOGRAM SELECTIVE EACH ADDITIONAL VESSEL  07/19/2018  . IR ANGIOGRAM SELECTIVE EACH ADDITIONAL VESSEL  07/19/2018  . IR ANGIOGRAM VISCERAL SELECTIVE  07/19/2018  . IR EMBO TUMOR ORGAN ISCHEMIA INFARCT INC GUIDE ROADMAPPING  07/19/2018  . IR RADIOLOGIST EVAL & MGMT  07/07/2018  . IR RADIOLOGIST EVAL & MGMT  08/16/2018  . IR RADIOLOGIST EVAL & MGMT  01/31/2019  . IR RADIOLOGIST EVAL & MGMT  04/25/2019  . IR RADIOLOGIST EVAL & MGMT  07/20/2019  . IR RADIOLOGIST EVAL & MGMT  10/17/2019  . IR US GUIDE VASC ACCESS LEFT  07/19/2018  . left jaw surgery     . MANDIBLE FRACTURE SURGERY      Allergies: Patient has no known allergies.  Medications: Prior to Admission medications   Medication Sig Start Date End Date Taking? Authorizing Provider  amLODipine (NORVASC) 5 MG tablet Take 1 tablet (5 mg total) by mouth daily. 08/24/19 11/22/19  Charlott Rakes, MD  aspirin EC 81 MG tablet Take 81 mg by mouth every morning. Reported on 12/19/2015    [provider]  omeprazole (PRILOSEC) 40 MG capsule TAKE 1 CAPSULE (40 MG TOTAL) BY MOUTH DAILY. 05/17/19   Thornton Park, MD  traMADol (ULTRAM) 50 MG tablet Take 1 tablet (50 mg total) by  mouth every 6 (six) hours as needed. 10/31/19   Orvan July, NP     Family History  Problem Relation Age of Onset  . Cancer Mother        type unknown  . Emphysema Brother   . Colon cancer Neg Hx   . Esophageal cancer Neg Hx   . Pancreatic cancer Neg Hx     Social History   Socioeconomic History  . Marital status: Single    Spouse name: Not on file  . Number of children: 1  . Years of  education: Not on file  . Highest education level: Not on file  Occupational History  . Occupation: works in Museum/gallery conservator  . Smoking status: Current Some Day Smoker    Packs/day: 1.00    Years: 50.00    Pack years: 50.00    Types: Cigarettes  . Smokeless tobacco: Never Used  . Tobacco comment: trying to quit, smoke every other day  Vaping Use  . Vaping Use: Never used  Substance and Sexual Activity  . Alcohol use: Yes    Alcohol/week: 0.0 standard drinks    Comment: 2-3 40 oz beers per day, x50 years  . Drug use: No    Comment: remote IV drug use age 12   . Sexual activity: Yes    Partners: Female, Male    Comment: patient declines  Other Topics Concern  . Not on file  Social History Narrative  . Not on file   Social Determinants of Health   Financial Resource Strain:   . Difficulty of Paying Living Expenses:   Food Insecurity:   . Worried About Charity fundraiser in the Last Year:   . Arboriculturist in the Last Year:   Transportation Needs:   . Film/video editor (Medical):   Marland Kitchen Lack of Transportation (Non-Medical):   Physical Activity:   . Days of Exercise per Week:   . Minutes of Exercise per Session:   Stress:   . Feeling of Stress :   Social Connections:   . Frequency of Communication with Friends and Family:   . Frequency of Social Gatherings with Friends and Family:   . Attends Religious Services:   . Active Member of Clubs or Organizations:   . Attends Archivist Meetings:   Marland Kitchen Marital Status:     ECOG Status: 0 - Asymptomatic  Review of Systems  Review of Systems: A 12 point ROS discussed and pertinent positives are indicated in the HPI above.  All other systems are negative.  Physical Exam No direct physical exam was performed (except for noted visual exam findings with Video Visits).    Vital Signs: There were no vitals taken for this visit.  Imaging: MR ABDOMEN WWO CONTRAST  Result Date: 01/26/2020 CLINICAL DATA:   Hepatocellular carcinoma follow-up. Liver directed therapy with drug-eluting bead chemoembolization on 07/19/2018. EXAM: MRI ABDOMEN WITHOUT AND WITH CONTRAST TECHNIQUE: Multiplanar multisequence MR imaging of the abdomen was performed both before and after the administration of intravenous contrast. CONTRAST:  71mL GADAVIST GADOBUTROL 1 MMOL/ML IV SOLN COMPARISON:  MRI 10/12/2019 FINDINGS: Lower chest:  Lung bases are clear. Hepatobiliary: Along the medial border of the inferior RIGHT hepatic lobe (is is segment 5) is again demonstrated rounded mass measuring 3.4 x 3.5 cm compared to 3.6 by 3.8 cm (remeasured) for continued reduction in volume. The lesion is predominantly nonvascular however along the medial wall of the rounded mass is there  is rim of subcapsular enhancement measuring 2.0 by 0.6 cm on image 43/19. This peripheral enhancement is not significant changed from comparison exam. On the more delayed sequences (series 28/29 there is peripheral enhancement which may relate to fibrosis. Similar to prior. Small 6 mm enhancing focus in the peripheral RIGHT hepatic lobe (image 29/19 likely represents benign vascular phenomena or regenerating nodule. Lesion not identified on the other pulse sequences Pancreas: Normal pancreatic parenchymal intensity. No ductal dilatation or inflammation. Spleen: Normal spleen. Adrenals/urinary tract: Adrenal glands and kidneys are normal. Stomach/Bowel: Thickening of the gastric mucosa to 2 cm is favored benign (image 9/23). This may relate to cirrhosis. Vascular/Lymphatic: Abdominal aortic normal caliber. No retroperitoneal periportal lymphadenopathy. Musculoskeletal: No aggressive osseous lesion IMPRESSION: 1. Continued reduction in volume of RIGHT hepatic lobe mass following chemoembolization 07/19/2018. There is persistent enhancement along the medial border of the lesion. No change from prior. Recommend attention on follow-up. 2. New small enhancing lesion in the RIGHT  hepatic lobe is too small to characterize and may represent vascular phenomena or dysplastic nodule. Recommend attention on routine surveillance. 3. Thickened gastric mucosa is favored related to cirrhosis. Electronically Signed   By: Suzy Bouchard M.D.   On: 01/26/2020 09:44    Labs:  CBC: Recent Labs    04/17/19 0824 06/12/19 1156 07/12/19 0837 01/15/20 0815  WBC 8.8 8.8 8.5 9.3  HGB 14.4 13.5 14.6 15.4  HCT 44.5 41.3 45.2 48.2  PLT 312 266 282 276    COAGS: Recent Labs    04/17/19 0824 07/12/19 0837 01/15/20 0815  INR 1.0 1.0 0.9    BMP: Recent Labs    04/17/19 0824 06/12/19 1156 07/12/19 0837 10/12/19 1123 01/15/20 0815  NA 140 140 137  --  138  K 4.8 4.1 4.9  --  5.4*  CL 105 104 101  --  102  CO2 26 25 24   --  27  GLUCOSE 53* 92 95  --  113*  BUN 11 11 9   --  6*  CALCIUM 10.1 9.5 10.1  --  10.1  CREATININE 0.98 0.96 0.97 0.80 0.70  GFRNONAA 80 >60 81  --  >60  GFRAA 93 >60 94  --  >60    LIVER FUNCTION TESTS: Recent Labs    04/17/19 0824 06/12/19 1156 07/12/19 0837 01/15/20 0815  BILITOT 0.2 0.4 0.5 0.5  AST 25 22 22 24   ALT 19 9 9 14   ALKPHOS  --  75  --  58  PROT 7.5 7.8 7.5 7.9  ALBUMIN  --  4.2  --  4.3    TUMOR MARKERS: Recent Labs    04/17/19 0824 07/12/19 0837  AFPTM 1.7 1.4    Assessment and Plan:  69 year old male who continues to do very well 2.5 years status post combined drug eluding bead chemoembolization and subsequent percutaneous microwave ablation of hepatocellular carcinoma in the posterior right hepatic lobe.  Current surveillance imaging demonstrates no evidence of residual or recurrent disease.  There is one small subcapsular focus which warrants attention on follow-up imaging.  1.)  Next MRI abdomen with gadolinium contrast and follow-up clinic visit in 6 months (December 2021).   Electronically Signed: Jacqulynn Cadet 01/30/2020, 3:01 PM   I spent a total of 10 Minutes in remote  clinical consultation,  greater than 50% of which was counseling/coordinating care for hepatocellular cancer.    Visit type: Audio only (telephone). Audio (no video) only due to patient preference. Alternative for in-person consultation at [facility],  [address] [place], [place], [place]. This visit type was conducted due to national recommendations for restrictions regarding the COVID-19 Pandemic (e.g. social distancing).  This format is felt to be most appropriate for this patient at this time.  All issues noted in this document were discussed and addressed.

## 2020-03-07 MED FILL — AMLODIPINE BESYLATE 5 MG TA: 5 | 30 days supply | Qty: 30 | Fill #2

## 2020-03-11 ENCOUNTER — Ambulatory Visit: Payer: Medicare Other | Attending: Family Medicine | Admitting: Family Medicine

## 2020-03-11 ENCOUNTER — Encounter: Payer: Self-pay | Admitting: Family Medicine

## 2020-03-11 ENCOUNTER — Other Ambulatory Visit: Payer: Self-pay | Admitting: Family Medicine

## 2020-03-11 ENCOUNTER — Other Ambulatory Visit: Payer: Self-pay

## 2020-03-11 VITALS — BP 126/72 | HR 88 | Ht 66.0 in | Wt 122.2 lb

## 2020-03-11 DIAGNOSIS — I10 Essential (primary) hypertension: Secondary | ICD-10-CM | POA: Diagnosis not present

## 2020-03-11 DIAGNOSIS — I700 Atherosclerosis of aorta: Secondary | ICD-10-CM | POA: Diagnosis not present

## 2020-03-11 DIAGNOSIS — K219 Gastro-esophageal reflux disease without esophagitis: Secondary | ICD-10-CM | POA: Insufficient documentation

## 2020-03-11 DIAGNOSIS — C220 Liver cell carcinoma: Secondary | ICD-10-CM | POA: Diagnosis not present

## 2020-03-11 DIAGNOSIS — Z7982 Long term (current) use of aspirin: Secondary | ICD-10-CM | POA: Diagnosis not present

## 2020-03-11 DIAGNOSIS — F321 Major depressive disorder, single episode, moderate: Secondary | ICD-10-CM | POA: Insufficient documentation

## 2020-03-11 DIAGNOSIS — E785 Hyperlipidemia, unspecified: Secondary | ICD-10-CM | POA: Diagnosis not present

## 2020-03-11 DIAGNOSIS — M1990 Unspecified osteoarthritis, unspecified site: Secondary | ICD-10-CM | POA: Insufficient documentation

## 2020-03-11 DIAGNOSIS — F17210 Nicotine dependence, cigarettes, uncomplicated: Secondary | ICD-10-CM | POA: Insufficient documentation

## 2020-03-11 DIAGNOSIS — M720 Palmar fascial fibromatosis [Dupuytren]: Secondary | ICD-10-CM | POA: Diagnosis not present

## 2020-03-11 DIAGNOSIS — Z79899 Other long term (current) drug therapy: Secondary | ICD-10-CM | POA: Diagnosis not present

## 2020-03-11 MED ORDER — CITALOPRAM HYDROBROMIDE 20 MG PO TABS: 20.0000 mg | ORAL_TABLET | Freq: Every day | ORAL | 6 refills | Status: DC

## 2020-03-11 MED ORDER — AMLODIPINE BESYLATE 5 MG PO TABS: 5.0000 mg | ORAL_TABLET | Freq: Every day | ORAL | 6 refills | Status: DC

## 2020-03-11 MED FILL — CITALOPRAM HBR 20 MG TABLET: 20 | 30 days supply | Qty: 30 | Fill #0

## 2020-03-11 NOTE — Patient Instructions (Signed)

## 2020-03-11 NOTE — Progress Notes (Signed)
Subjective:  Patient ID: Calvin Gardner, male    DOB: Oct 04, 1950  Age: 69 y.o. MRN: [id number]  CC: Hand Pain   HPI [name] presents for  Is a 69 year old male with a history of hypertension, GERD, tobacco and diffuse alcohol abuse, diagnosed with hepatocellular carcinoma (stage IIIa cT3,cN0,cM0) in the fall of last year s/p IR transcatheter chemo embolization of the liver mass in 07/2018, status post who presents today for follow-up visit. Last seen by oncology in 06/2019 and as per oncology notes he was thought to be doing well, is currently on surveillance, imaging reports good response to treatment and he will follow-up as needed.  R hand has been hurting; I referred him to Orthopedic for management of Dupuytren's contracture where he was prescribed 'an ointment' which was ineffective  He endorses being Depressed due to having many problems and has anhedonia. He denies suicidal ideation ideation even though he circled a 3 on his PHQ 9.  He is not opposed to commencing an antidepressant for his symptoms. Smokes 1 pack/day.  Past Medical History:  Diagnosis Date  . Alcoholism ([place])   . Aortic atherosclerosis ([place])   . Arthritis   . Cancer ([place])    liver  . Cataract   . Hepatitis C   . HLD (hyperlipidemia)   . Hypertension   . Liver mass   . Pulmonary nodule     Past Surgical History:  Procedure Laterality Date  . COLONOSCOPY WITH PROPOFOL N/A 01/30/2014   Procedure: COLONOSCOPY WITH PROPOFOL;  Surgeon: Garlan Fair, MD;  Location: [facility];  Service: Endoscopy;  Laterality: N/A;  . ESOPHAGOGASTRODUODENOSCOPY (EGD) WITH PROPOFOL N/A 01/30/2014   Procedure: ESOPHAGOGASTRODUODENOSCOPY (EGD) WITH PROPOFOL;  Surgeon: Garlan Fair, MD;  [facility];  Service: Endoscopy;  Laterality: N/A;  . IR ANGIOGRAM SELECTIVE EACH ADDITIONAL VESSEL  07/19/2018  . IR ANGIOGRAM SELECTIVE EACH ADDITIONAL VESSEL  07/19/2018  . IR ANGIOGRAM SELECTIVE EACH  ADDITIONAL VESSEL  07/19/2018  . IR ANGIOGRAM SELECTIVE EACH ADDITIONAL VESSEL  07/19/2018  . IR ANGIOGRAM SELECTIVE EACH ADDITIONAL VESSEL  07/19/2018  . IR ANGIOGRAM VISCERAL SELECTIVE  07/19/2018  . IR EMBO TUMOR ORGAN ISCHEMIA INFARCT INC GUIDE ROADMAPPING  07/19/2018  . IR RADIOLOGIST EVAL & MGMT  07/07/2018  . IR RADIOLOGIST EVAL & MGMT  08/16/2018  . IR RADIOLOGIST EVAL & MGMT  01/31/2019  . IR RADIOLOGIST EVAL & MGMT  04/25/2019  . IR RADIOLOGIST EVAL & MGMT  07/20/2019  . IR RADIOLOGIST EVAL & MGMT  10/17/2019  . IR RADIOLOGIST EVAL & MGMT  01/30/2020  . IR US GUIDE VASC ACCESS LEFT  07/19/2018  . left jaw surgery     . MANDIBLE FRACTURE SURGERY      Family History  Problem Relation Age of Onset  . Cancer Mother        type unknown  . Emphysema Brother   . Colon cancer Neg Hx   . Esophageal cancer Neg Hx   . Pancreatic cancer Neg Hx     No Known Allergies  Outpatient Medications Prior to Visit  Medication Sig Dispense Refill  . aspirin EC 81 MG tablet Take 81 mg by mouth every morning. Reported on 12/19/2015    . omeprazole (PRILOSEC) 40 MG capsule TAKE 1 CAPSULE (40 MG TOTAL) BY MOUTH DAILY. (Patient not taking: Reported on 03/11/2020) 60 capsule 3  . traMADol (ULTRAM) 50 MG tablet Take 1 tablet (50 mg total) by mouth every 6 (six) hours  as needed. (Patient not taking: Reported on 03/11/2020) 12 tablet 0  . amLODipine (NORVASC) 5 MG tablet Take 1 tablet (5 mg total) by mouth daily. 30 tablet 2   No facility-administered medications prior to visit.     ROS Review of Systems  Constitutional: Negative for activity change and appetite change.  HENT: Negative for sinus pressure and sore throat.   Eyes: Negative for visual disturbance.  Respiratory: Negative for cough, chest tightness and shortness of breath.   Cardiovascular: Negative for chest pain and leg swelling.  Gastrointestinal: Negative for abdominal distention, abdominal pain, constipation and diarrhea.  Endocrine:  Negative.   Genitourinary: Negative for dysuria.  Musculoskeletal: Negative for myalgias.       See HPI  Skin: Negative for rash.  Allergic/Immunologic: Negative.   Neurological: Negative for weakness, light-headedness and numbness.  Psychiatric/Behavioral: Negative for dysphoric mood and suicidal ideas.    Objective:  BP 126/72   Pulse 88   Ht 5\' 6"  (1.676 m)   Wt 122 lb 3.2 oz (55.4 kg)   SpO2 97%   BMI 19.72 kg/m   BP/Weight 03/11/2020 10/31/2019 01/20/5092  Systolic BP 267 124 580  Diastolic BP 72 97 89  Wt. (Lbs) 122.2 - -  BMI 19.72 - -      Physical Exam Constitutional:      Appearance: He is well-developed.  Neck:     Vascular: No JVD.  Cardiovascular:     Rate and Rhythm: Normal rate.     Heart sounds: Normal heart sounds. No murmur heard.   Pulmonary:     Effort: Pulmonary effort is normal.     Breath sounds: Normal breath sounds. No wheezing or rales.  Chest:     Chest wall: No tenderness.  Abdominal:     General: Bowel sounds are normal. There is no distension.     Palpations: Abdomen is soft. There is no mass.     Tenderness: There is no abdominal tenderness.  Musculoskeletal:     Right lower leg: No edema.     Left lower leg: No edema.     Comments: Right hand Dupuytren's contracture in right fourth flexor tendon limiting full extension of the fourth and ring fingers of the right hand  Neurological:     Mental Status: He is alert and oriented to person, place, and time.  Psychiatric:        Mood and Affect: Mood normal.     CMP Latest Ref Rng & Units 01/15/2020 10/12/2019 07/12/2019  Glucose 70 - 99 mg/dL 113(H) - 95  BUN 8 - 23 mg/dL 6(L) - 9  Creatinine 0.61 - 1.24 mg/dL 0.70 0.80 0.97  Sodium 135 - 145 mmol/L 138 - 137  Potassium 3.5 - 5.1 mmol/L 5.4(H) - 4.9  Chloride 98 - 111 mmol/L 102 - 101  CO2 22 - 32 mmol/L 27 - 24  Calcium 8.9 - 10.3 mg/dL 10.1 - 10.1  Total Protein 6.5 - 8.1 g/dL 7.9 - 7.5  Total Bilirubin 0.3 - 1.2 mg/dL 0.5 - 0.5   Alkaline Phos 38 - 126 U/L 58 - -  AST 15 - 41 U/L 24 - 22  ALT 0 - 44 U/L 14 - 9    Lipid Panel     Component Value Date/Time   CHOL 156 03/18/2017 0834   TRIG 52 03/18/2017 0834   HDL 72 03/18/2017 0834   CHOLHDL 2.2 03/18/2017 0834   CHOLHDL 2.2 01/05/2014 1620   VLDL 12 01/05/2014 1620  LDLCALC 74 03/18/2017 0834    CBC    Component Value Date/Time   WBC 9.3 01/15/2020 0815   RBC 5.22 01/15/2020 0815   HGB 15.4 01/15/2020 0815   HGB 13.5 06/12/2019 1156   HGB 13.0 05/16/2018 1219   HCT 48.2 01/15/2020 0815   HCT 39.1 05/16/2018 1219   PLT 276 01/15/2020 0815   PLT 266 06/12/2019 1156   PLT 199 05/16/2018 1219   MCV 92.3 01/15/2020 0815   MCV 91 05/16/2018 1219   MCH 29.5 01/15/2020 0815   MCHC 32.0 01/15/2020 0815   RDW 14.4 01/15/2020 0815   RDW 12.1 (L) 05/16/2018 1219   LYMPHSABS 2.3 06/12/2019 1156   LYMPHSABS 1.0 05/16/2018 1219   MONOABS 1.0 06/12/2019 1156   EOSABS 0.1 06/12/2019 1156   EOSABS 0.0 05/16/2018 1219   BASOSABS 0.0 06/12/2019 1156   BASOSABS 0.1 05/16/2018 1219    Lab Results  Component Value Date   HGBA1C 5.0 10/07/2017    Assessment & Plan:  1. Essential hypertension Controlled Counseled on blood pressure goal of less than 130/80, low-sodium, DASH diet, medication compliance, 150 minutes of moderate intensity exercise per week. Discussed medication compliance, adverse effects. - amLODipine (NORVASC) 5 MG tablet; Take 1 tablet (5 mg total) by mouth daily.  Dispense: 30 tablet; Refill: 6  2. Current moderate episode of major depressive disorder without prior episode ([place]) Uncontrolled Due to underlying social situations Commenced on Celexa LCSW not in clinic to see patient today but I have placed him on her schedule - citalopram (CELEXA) 20 MG tablet; Take 1 tablet (20 mg total) by mouth daily.  Dispense: 30 tablet; Refill: 6  3. Dupuytren's contracture Might benefit from cortisone injection versus tendon release surgery -  AMB referral to orthopedics    Meds ordered this encounter  Medications  . amLODipine (NORVASC) 5 MG tablet    Sig: Take 1 tablet (5 mg total) by mouth daily.    Dispense:  30 tablet    Refill:  6  . citalopram (CELEXA) 20 MG tablet    Sig: Take 1 tablet (20 mg total) by mouth daily.    Dispense:  30 tablet    Refill:  6    Follow-up: Return for [place]-depression; 3 months PCP medical conditions.       Charlott Rakes, MD, FAAFP. [facility] and [place], [place]   03/11/2020, 5:41 PM

## 2020-03-20 ENCOUNTER — Telehealth: Payer: Self-pay | Admitting: Licensed Clinical Social Worker

## 2020-03-20 ENCOUNTER — Ambulatory Visit: Payer: Medicare Other | Admitting: Licensed Clinical Social Worker

## 2020-03-20 ENCOUNTER — Other Ambulatory Visit: Payer: Self-pay

## 2020-03-20 NOTE — Telephone Encounter (Signed)
Call placed to patient regarding scheduled IBH appointment. LCSW left message requesting a return call.  

## 2020-03-25 ENCOUNTER — Other Ambulatory Visit: Payer: Self-pay

## 2020-03-25 ENCOUNTER — Ambulatory Visit: Payer: Medicare Other | Admitting: Licensed Clinical Social Worker

## 2020-03-28 ENCOUNTER — Other Ambulatory Visit: Payer: Self-pay

## 2020-03-28 ENCOUNTER — Ambulatory Visit: Payer: Medicare Other | Attending: Family Medicine | Admitting: Licensed Clinical Social Worker

## 2020-03-28 DIAGNOSIS — F321 Major depressive disorder, single episode, moderate: Secondary | ICD-10-CM | POA: Diagnosis not present

## 2020-03-28 MED FILL — CITALOPRAM HBR 20 MG TABLET: 20 | 30 days supply | Qty: 30 | Fill #0

## 2020-03-29 ENCOUNTER — Encounter: Payer: Self-pay | Admitting: Orthopaedic Surgery

## 2020-03-29 ENCOUNTER — Ambulatory Visit (INDEPENDENT_AMBULATORY_CARE_PROVIDER_SITE_OTHER): Payer: Medicare Other

## 2020-03-29 ENCOUNTER — Ambulatory Visit (INDEPENDENT_AMBULATORY_CARE_PROVIDER_SITE_OTHER): Payer: Medicare Other | Admitting: Orthopaedic Surgery

## 2020-03-29 VITALS — Ht 66.0 in | Wt 123.6 lb

## 2020-03-29 DIAGNOSIS — M79641 Pain in right hand: Secondary | ICD-10-CM

## 2020-03-29 MED ORDER — DICLOFENAC SODIUM 75 MG PO TBEC: 75.0000 mg | DELAYED_RELEASE_TABLET | Freq: Two times a day (BID) | ORAL | 1 refills | Status: DC | PRN

## 2020-03-29 MED FILL — DICLOFENAC SOD EC 75 MG TAB: 75 | 30 days supply | Qty: 60 | Fill #0

## 2020-03-29 NOTE — Progress Notes (Signed)
Office Visit Note   Patient: Calvin Gardner           Date of Birth: 07/16/1951           MRN: [id number] Visit Date: 03/29/2020              Requested by: Charlott Rakes, MD [place],  [place] [id number] PCP: Charlott Rakes, MD   Assessment & Plan: Visit Diagnoses:  1. Pain in right hand     Plan: Impression is right hand diffuse osteoarthritis, Dupuytren's contracture and soft tissue contracture of the small finger at the PIP joint.  At this point, have called in Voltaren p.o. to take twice daily as needed.  I will also go ahead and start him in hand therapy and an internal referral has been made.  He will follow up with [place] as needed.  Follow-Up Instructions: No follow-ups on file.   Orders:  Orders Placed This Encounter  Procedures  . XR Hand Complete Right  . Ambulatory referral to Physical Therapy   Meds ordered this encounter  Medications  . diclofenac (VOLTAREN) 75 MG EC tablet    Sig: Take 1 tablet (75 mg total) by mouth 2 (two) times daily as needed.    Dispense:  60 tablet    Refill:  1      Procedures: No procedures performed   Clinical Data: No additional findings.   Subjective: Chief Complaint  Patient presents with  . Right Hand - Pain    HPI patient is a pleasant 69 year old gentleman who comes in today with recurrent right hand pain but primarily pain and contracture to the right small finger.  This is been ongoing for the past several months.  He was seen in our office about 2 years ago where he was diagnosed with moderate arthritis Dupuytren's contracture to the right hand.  He was provided Voltaren gel which has not helped.  The pain he is now having is primarily to the small finger and into the palm of his hand.  He describes this as a constant ache worse with movement of the small finger.  He does take Tylenol as needed which does not provide much relief.  No numbness, tingling or burning.  Review of Systems as detailed in  HPI.  All others reviewed and are negative.   Objective: Vital Signs: Ht 5\' 6"  (1.676 m)   Wt 123 lb 9.6 oz (56.1 kg)   BMI 19.95 kg/m   Physical Exam well-developed well-nourished gentleman no acute distress.  Alert and oriented x3.  Ortho Exam examination of his right hip and reveals mild swelling to the small finger.  He is not actively able to fully extend this at the PIP joint.  I am able to get it to near full extension.  He has full flexion.  He does have palpable cords along the palm at the ring and small fingers.  Negative tabletop test.  He is neurovascularly intact distally.  Specialty Comments:  No specialty comments available.  Imaging: XR Hand Complete Right  Result Date: 03/29/2020 X-rays demonstrate diffuse osteoarthritis throughout the right hand, specifically the DIP and PIP joints of all 5 fingers.  No other acute findings.    PMFS History: Patient Active Problem List   Diagnosis Date Noted  . Hepatocellular carcinoma ([place]) 06/09/2018  . Pulmonary nodule 05/25/2018  . GERD (gastroesophageal reflux disease) 01/10/2018  . Hiccough 10/07/2017  . Alcohol abuse 10/07/2017  . Chest pain 10/29/2015  .  Liver fibrosis ([place]) 10/23/2014  . Chronic hepatitis C without hepatic coma ([place]) 09/19/2014  . IFG (impaired fasting glucose) 04/02/2014  . Vitamin D deficiency 04/02/2014  . Abnormal LFTs 04/02/2014  . Cataract, right eye 01/05/2014  . Essential hypertension, benign 01/05/2014  . Smoking 01/05/2014   Past Medical History:  Diagnosis Date  . Alcoholism ([place])   . Aortic atherosclerosis ([place])   . Arthritis   . Cancer ([place])    liver  . Cataract   . Hepatitis C   . HLD (hyperlipidemia)   . Hypertension   . Liver mass   . Pulmonary nodule     Family History  Problem Relation Age of Onset  . Cancer Mother        type unknown  . Emphysema Brother   . Colon cancer Neg Hx   . Esophageal cancer Neg Hx   . Pancreatic cancer Neg Hx     Past Surgical  History:  Procedure Laterality Date  . COLONOSCOPY WITH PROPOFOL N/A 01/30/2014   Procedure: COLONOSCOPY WITH PROPOFOL;  Surgeon: Garlan Fair, MD;  [facility];  Service: Endoscopy;  Laterality: N/A;  . ESOPHAGOGASTRODUODENOSCOPY (EGD) WITH PROPOFOL N/A 01/30/2014   Procedure: ESOPHAGOGASTRODUODENOSCOPY (EGD) WITH PROPOFOL;  Surgeon: Garlan Fair, MD;  [facility];  Service: Endoscopy;  Laterality: N/A;  . IR ANGIOGRAM SELECTIVE EACH ADDITIONAL VESSEL  07/19/2018  . IR ANGIOGRAM SELECTIVE EACH ADDITIONAL VESSEL  07/19/2018  . IR ANGIOGRAM SELECTIVE EACH ADDITIONAL VESSEL  07/19/2018  . IR ANGIOGRAM SELECTIVE EACH ADDITIONAL VESSEL  07/19/2018  . IR ANGIOGRAM SELECTIVE EACH ADDITIONAL VESSEL  07/19/2018  . IR ANGIOGRAM VISCERAL SELECTIVE  07/19/2018  . IR EMBO TUMOR ORGAN ISCHEMIA INFARCT INC GUIDE ROADMAPPING  07/19/2018  . IR RADIOLOGIST EVAL & MGMT  07/07/2018  . IR RADIOLOGIST EVAL & MGMT  08/16/2018  . IR RADIOLOGIST EVAL & MGMT  01/31/2019  . IR RADIOLOGIST EVAL & MGMT  04/25/2019  . IR RADIOLOGIST EVAL & MGMT  07/20/2019  . IR RADIOLOGIST EVAL & MGMT  10/17/2019  . IR RADIOLOGIST EVAL & MGMT  01/30/2020  . IR US GUIDE VASC ACCESS LEFT  07/19/2018  . left jaw surgery     . MANDIBLE FRACTURE SURGERY     Social History   Occupational History  . Occupation: works in Museum/gallery conservator  . Smoking status: Current Some Day Smoker    Packs/day: 1.00    Years: 50.00    Pack years: 50.00    Types: Cigarettes  . Smokeless tobacco: Never Used  . Tobacco comment: trying to quit, smoke every other day  Vaping Use  . Vaping Use: Never used  Substance and Sexual Activity  . Alcohol use: Yes    Alcohol/week: 0.0 standard drinks    Comment: 2-3 40 oz beers per day, x50 years  . Drug use: No    Comment: remote IV drug use age 39   . Sexual activity: Yes    Partners: Female, Male    Comment: patient declines

## 2020-04-01 MED FILL — AMLODIPINE BESYLATE 5 MG TA: 5 | 30 days supply | Qty: 30 | Fill #0

## 2020-04-15 NOTE — BH Specialist Note (Signed)
Integrated Behavioral Health Initial Visit  MRN: [id number] Name: Calvin Gardner [facility]  Number of [place] Clinician visits:: 1/6 Session Start time: 8:40 AM  Session End time: 9:10 AM Total time: 30  Type of Service: [place] Interpretor:No. Interpretor Name and Language: NA   SUBJECTIVE: Calvin Gardner is a 69 y.o. male accompanied by self Patient was referred by Dr. Margarita Rana for depression. Patient reports the following symptoms/concerns: Pt reports difficulty managing depression symptoms. Pt is grieving loss of long-time partner. Current significant other's health is declining and may be placed outside of the home.  Duration of problem: Ongoing; Severity of problem: moderate  OBJECTIVE: Mood: Depressed and Affect: Depressed Risk of harm to self or others: No plan to harm self or others Pt scored positive at last visit with PCP; however, pt denies current SI/HI. 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 He is interested in applying for SSI Self-Care: Pt is participating in medication management through PCP. He enjoys riding motorcycle Life Changes: Pt reports difficulty managing grief and stress regarding partner's health  STRENGTHS: Pt has social connections  GOALS ADDRESSED: Patient will: 1. Reduce symptoms of: depression and stress 2. Increase knowledge and/or ability of: coping skills and healthy habits  3. Demonstrate ability to: Increase healthy adjustment to current life circumstances, Increase adequate support systems for patient/family and Begin healthy grieving over loss   PROGRESS OF GOALS: Ongoing  INTERVENTIONS: Interventions utilized: Solution-Focused Strategies, Supportive Counseling, Psychoeducation and/or Health Education and Link to  [employer]  Standardized Assessments completed: C-SSRS Short  ASSESSMENT: Patient currently experiencing difficulty managing depression symptoms triggered by grief and decline in partner's health. Pt denies current SI/HI.   Patient may benefit from therapy and continued medication management. Validation and encouragement provided. Healthy coping skills discussed to assist in management of symptoms. Pt reports compliance with prescribed medications.  Pt provided verbal consent for LCSW to complete Legal Aid referral to assist with SSI appeal.   PLAN: 1. Follow up with behavioral health clinician on : Pt agreed to contact LCSW with any additional behavioral health and/or resource needs 2. Behavioral recommendations: Continue compliance with medications, utilize healthy coping skills discussed, and follow up with Legal Aid 3. Referral(s): [place] (In Clinic) and Legal Aid 4. "From scale of 1-10, how likely are you to follow plan?":   Rebekah [place], LCSW 04/16/20 5:20 AM

## 2020-05-02 ENCOUNTER — Telehealth: Payer: Self-pay | Admitting: Licensed Clinical Social Worker

## 2020-05-02 NOTE — Telephone Encounter (Signed)
LCSW submitted completed Legal Aid referral 

## 2020-05-20 MED FILL — AMLODIPINE BESYLATE 5 MG TA: 5 | 30 days supply | Qty: 30 | Fill #1

## 2020-06-11 ENCOUNTER — Ambulatory Visit: Payer: Medicare Other | Admitting: Family Medicine

## 2020-06-25 ENCOUNTER — Other Ambulatory Visit: Payer: Self-pay

## 2020-06-25 ENCOUNTER — Ambulatory Visit: Payer: Medicare Other | Attending: Family Medicine | Admitting: Family Medicine

## 2020-06-25 ENCOUNTER — Other Ambulatory Visit: Payer: Self-pay | Admitting: Family Medicine

## 2020-06-25 DIAGNOSIS — I10 Essential (primary) hypertension: Secondary | ICD-10-CM

## 2020-06-25 DIAGNOSIS — F321 Major depressive disorder, single episode, moderate: Secondary | ICD-10-CM | POA: Diagnosis not present

## 2020-06-25 MED ORDER — AMLODIPINE BESYLATE 5 MG PO TABS: 5.0000 mg | ORAL_TABLET | Freq: Every day | ORAL | 6 refills | Status: DC

## 2020-06-25 MED FILL — AMLODIPINE BESYLATE 5 MG TA: 5 | 30 days supply | Qty: 30 | Fill #0

## 2020-06-25 NOTE — Progress Notes (Signed)
Questions about the booster vaccine.

## 2020-06-25 NOTE — Progress Notes (Signed)
Virtual Visit via Telephone Note  I connected with Calvin Gardner, on 06/25/2020 at 11:31 AM by telephone due to the COVID-19 pandemic and verified that I am speaking with the correct person using two identifiers.   Consent: I discussed the limitations, risks, security and privacy concerns of performing an evaluation and management service by telephone and the availability of in person appointments. I also discussed with the patient that there may be a patient responsible charge related to this service. The patient expressed understanding and agreed to proceed.   Location of Patient: Home  Location of Provider: Clinic   Persons participating in Telemedicine visit: [employer] [place]-CMA Dr. Margarita Rana     History of Present Illness: Calvin Gardner isa 69 year old male with a history of hypertension, GERD, tobacco anddiffusealcohol abuse,diagnosed with hepatocellular carcinoma (stage IIIa cT3,cN0,cM0)in the fall of last years/pIR transcatheter chemo embolization of the liver mass in12/2019, status postwho presents today for follow-up visit.   He is wondering if he needs to get the booster vaccine. States he is not depressed and no longer needs Celexa  Still has hiccups and uses 'a medication prescribed by Oncology'. Last seen by oncology in 06/2019 and as per oncology notes he was thought to be doing well, is currently on surveillance, imaging reports good response to treatment.  He informs me his next follow-up is in 1 month. He has no additional concerns today.  Past Medical History:  Diagnosis Date  . Alcoholism ([place])   . Aortic atherosclerosis ([place])   . Arthritis   . Cancer ([place])    liver  . Cataract   . Hepatitis C   . HLD (hyperlipidemia)   . Hypertension   . Liver mass   . Pulmonary nodule    No Known Allergies  Current Outpatient Medications on File Prior to Visit  Medication Sig Dispense Refill  . aspirin EC 81 MG tablet Take 81 mg  by mouth every morning. Reported on 12/19/2015    . citalopram (CELEXA) 20 MG tablet Take 1 tablet (20 mg total) by mouth daily. 30 tablet 6  . diclofenac (VOLTAREN) 75 MG EC tablet Take 1 tablet (75 mg total) by mouth 2 (two) times daily as needed. 60 tablet 1  . amLODipine (NORVASC) 5 MG tablet Take 1 tablet (5 mg total) by mouth daily. 30 tablet 6  . omeprazole (PRILOSEC) 40 MG capsule TAKE 1 CAPSULE (40 MG TOTAL) BY MOUTH DAILY. (Patient not taking: Reported on 03/11/2020) 60 capsule 3  . traMADol (ULTRAM) 50 MG tablet Take 1 tablet (50 mg total) by mouth every 6 (six) hours as needed. (Patient not taking: Reported on 03/11/2020) 12 tablet 0   No current facility-administered medications on file prior to visit.    Observations/Objective: Awake, alert, oriented x3 Not in acute distress  Assessment and Plan: 1. Current moderate episode of major depressive disorder without prior episode ([place]) Currently in remission He no longer needs Celexa  2. Essential hypertension Controlled Continue amlodipine.   Follow Up Instructions: 6 months for chronic disease management   I discussed the assessment and treatment plan with the patient. The patient was provided an opportunity to ask questions and all were answered. The patient agreed with the plan and demonstrated an understanding of the instructions.   The patient was advised to call back or seek an in-person evaluation if the symptoms worsen or if the condition fails to improve as anticipated.     I provided 11 minutes total of non-face-to-face  time during this encounter including median intraservice time, reviewing previous notes, investigations, ordering medications, medical decision making, coordinating care and patient verbalized understanding at the end of the visit.     Charlott Rakes, MD, FAAFP. [facility] and [place], [place]   06/25/2020, 11:31 AM

## 2020-07-02 ENCOUNTER — Ambulatory Visit: Payer: Medicare Other | Attending: Family Medicine

## 2020-07-02 ENCOUNTER — Other Ambulatory Visit: Payer: Self-pay | Admitting: Interventional Radiology

## 2020-07-02 ENCOUNTER — Other Ambulatory Visit: Payer: Self-pay

## 2020-07-02 VITALS — Temp 98.6°F

## 2020-07-02 DIAGNOSIS — Z23 Encounter for immunization: Secondary | ICD-10-CM | POA: Diagnosis not present

## 2020-07-02 DIAGNOSIS — C220 Liver cell carcinoma: Secondary | ICD-10-CM

## 2020-07-02 NOTE — Progress Notes (Signed)
FLU SHOT ADMINISTERED

## 2020-07-03 ENCOUNTER — Other Ambulatory Visit: Payer: Self-pay

## 2020-07-03 DIAGNOSIS — C220 Liver cell carcinoma: Secondary | ICD-10-CM

## 2020-07-31 ENCOUNTER — Other Ambulatory Visit: Payer: Self-pay

## 2020-07-31 ENCOUNTER — Ambulatory Visit (HOSPITAL_COMMUNITY)
Admission: RE | Admit: 2020-07-31 | Discharge: 2020-07-31 | Disposition: A | Discharge status: Home / Self Care | Payer: Medicare Other | Source: Ambulatory Visit | Attending: Interventional Radiology | Admitting: Interventional Radiology

## 2020-07-31 DIAGNOSIS — Z9049 Acquired absence of other specified parts of digestive tract: Secondary | ICD-10-CM | POA: Diagnosis not present

## 2020-07-31 DIAGNOSIS — C220 Liver cell carcinoma: Secondary | ICD-10-CM | POA: Diagnosis not present

## 2020-07-31 DIAGNOSIS — N281 Cyst of kidney, acquired: Secondary | ICD-10-CM | POA: Diagnosis not present

## 2020-07-31 MED ORDER — GADOBUTROL 1 MMOL/ML IV SOLN
5.0000 mL | Freq: Once | INTRAVENOUS | Status: AC | PRN
  Administered 2020-07-31: 5 mL via INTRAVENOUS

## 2020-08-08 ENCOUNTER — Other Ambulatory Visit: Payer: Self-pay | Admitting: *Deleted

## 2020-08-08 DIAGNOSIS — C220 Liver cell carcinoma: Secondary | ICD-10-CM

## 2020-08-08 MED FILL — AMLODIPINE BESYLATE 5 MG TA: 5 | 30 days supply | Qty: 30 | Fill #1

## 2020-08-12 DIAGNOSIS — C220 Liver cell carcinoma: Secondary | ICD-10-CM | POA: Diagnosis not present

## 2020-08-13 LAB — COMPLETE METABOLIC PANEL WITH GFR
AG Ratio: 1.5 (calc) (ref 1.0–2.5)
ALT: 12 U/L (ref 9–46)
AST: 29 U/L (ref 10–35)
Albumin: 4.5 g/dL (ref 3.6–5.1)
Alkaline phosphatase (APISO): 45 U/L (ref 35–144)
BUN: 10 mg/dL (ref 7–25)
CO2: 26 mmol/L (ref 20–32)
Calcium: 10.3 mg/dL (ref 8.6–10.3)
Chloride: 102 mmol/L (ref 98–110)
Creat: 0.86 mg/dL (ref 0.70–1.25)
GFR, Est African American: 104 mL/min/{1.73_m2} (ref >=60)
GFR, Est Non African American: 90 mL/min/{1.73_m2} (ref >=60)
Globulin: 3.1 g/dL (calc) (ref 1.9–3.7)
Glucose, Bld: 86 mg/dL (ref 65–99)
Potassium: 4.6 mmol/L (ref 3.5–5.3)
Sodium: 137 mmol/L (ref 135–146)
Total Bilirubin: 0.4 mg/dL (ref 0.2–1.2)
Total Protein: 7.6 g/dL (ref 6.1–8.1)

## 2020-08-13 LAB — CBC
HCT: 44.7 % (ref 38.5–50.0)
Hemoglobin: 14.9 g/dL (ref 13.2–17.1)
MCH: 29.7 pg (ref 27.0–33.0)
MCHC: 33.3 g/dL (ref 32.0–36.0)
MCV: 89 fL (ref 80.0–100.0)
MPV: 9.9 fL (ref 7.5–12.5)
Platelets: 301 10*3/uL (ref 140–400)
RBC: 5.02 10*6/uL (ref 4.20–5.80)
RDW: 11.6 % (ref 11.0–15.0)
WBC: 10.3 10*3/uL (ref 3.8–10.8)

## 2020-08-13 LAB — PROTIME-INR
INR: 1
Prothrombin Time: 10.8 s (ref 9.0–11.5)

## 2020-08-13 LAB — AFP TUMOR MARKER: AFP-Tumor Marker: 1.8 ng/mL (ref <6.1)

## 2020-08-14 ENCOUNTER — Other Ambulatory Visit: Payer: Self-pay

## 2020-08-14 ENCOUNTER — Encounter: Payer: Self-pay | Admitting: *Deleted

## 2020-08-14 ENCOUNTER — Ambulatory Visit
Admission: RE | Admit: 2020-08-14 | Discharge: 2020-08-14 | Disposition: A | Discharge status: Home / Self Care | Payer: Medicare Other | Source: Ambulatory Visit | Attending: Interventional Radiology | Admitting: Interventional Radiology

## 2020-08-14 DIAGNOSIS — C220 Liver cell carcinoma: Secondary | ICD-10-CM | POA: Diagnosis not present

## 2020-08-14 DIAGNOSIS — Z9889 Other specified postprocedural states: Secondary | ICD-10-CM | POA: Diagnosis not present

## 2020-08-14 HISTORY — PX: IR RADIOLOGIST EVAL & MGMT: IMG5224

## 2020-08-14 NOTE — Progress Notes (Signed)
Chief Complaint: Patient was seen in follow-up remotely today (TeleHealth) for hepatocellular cancer at the request of Calvin Charles K.    Referring Physician(s): Dr. Almond Gardner  History of Present Illness: Calvin Gardner is a 70 y.o. male with a history ofHCV and EtOH cirrhosis complicated by development of a large 7.4 cm partially exophytic mass arising from the inferior aspect of hepatic segment 6. MR imaging demonstrates Li-RADS 5imagingfeatures diagnostic of hepatocellular carcinoma.  He was deemed not to be a surgical candidate and therefore underwent liver directed therapy with drug-eluting bead chemoembolization on 07/19/2018.  Surveillance MRI imaging was performed on3/06/2020. Excellent continued response to therapy with further involution of the treated segment 6 mass. There has been a nearly 50% reduction in lesion volume.   MRI imaging dated 01/26/2020 demonstrates a further reduction in volume of the previously treated lesion.  No evidence of residual or recurrent disease.  A small 6 mm focus of subcapsular enhancement is present peripherally and may represent a benign vascular phenomenon or regenerating nodule.  MRI dated 07/31/20:  No significant change in right hepatic lobe mass status post chemoembolization. Nodular soft tissue enhancement along its medial wall remains stable, but is suspicious for residual carcinoma. Recommend continued follow-up by MRI in 6 months.  Mr. Hockettcontinues to do verywell. He continues to have issues with hiccups intermittently.  He has been discussing this with his primary care physician.  Additionally, he had an episode of acute pain in the groin after lifting a heavy cinderblock and now has an intermittent lump in the area that comes up and goes down.  It sounds like Calvin Gardner has developed an inguinal hernia.    Past Medical History:  Diagnosis Date  . Alcoholism (HCC)   . Aortic atherosclerosis (HCC)    . Arthritis   . Cancer (HCC)    liver  . Cataract   . Hepatitis C   . HLD (hyperlipidemia)   . Hypertension   . Liver mass   . Pulmonary nodule     Past Surgical History:  Procedure Laterality Date  . COLONOSCOPY WITH PROPOFOL N/A 01/30/2014   Procedure: COLONOSCOPY WITH PROPOFOL;  Surgeon: Calvin Bumpers, MD;  Location: [facility];  Service: Endoscopy;  Laterality: N/A;  . ESOPHAGOGASTRODUODENOSCOPY (EGD) WITH PROPOFOL N/A 01/30/2014   Procedure: ESOPHAGOGASTRODUODENOSCOPY (EGD) WITH PROPOFOL;  Surgeon: Calvin Bumpers, MD;  Location: [facility];  Service: Endoscopy;  Laterality: N/A;  . IR ANGIOGRAM SELECTIVE EACH ADDITIONAL VESSEL  07/19/2018  . IR ANGIOGRAM SELECTIVE EACH ADDITIONAL VESSEL  07/19/2018  . IR ANGIOGRAM SELECTIVE EACH ADDITIONAL VESSEL  07/19/2018  . IR ANGIOGRAM SELECTIVE EACH ADDITIONAL VESSEL  07/19/2018  . IR ANGIOGRAM SELECTIVE EACH ADDITIONAL VESSEL  07/19/2018  . IR ANGIOGRAM VISCERAL SELECTIVE  07/19/2018  . IR EMBO TUMOR ORGAN ISCHEMIA INFARCT INC GUIDE ROADMAPPING  07/19/2018  . IR RADIOLOGIST EVAL & MGMT  07/07/2018  . IR RADIOLOGIST EVAL & MGMT  08/16/2018  . IR RADIOLOGIST EVAL & MGMT  01/31/2019  . IR RADIOLOGIST EVAL & MGMT  04/25/2019  . IR RADIOLOGIST EVAL & MGMT  07/20/2019  . IR RADIOLOGIST EVAL & MGMT  10/17/2019  . IR RADIOLOGIST EVAL & MGMT  01/30/2020  . IR US GUIDE VASC ACCESS LEFT  07/19/2018  . left jaw surgery     . MANDIBLE FRACTURE SURGERY      Allergies: Patient has no known allergies.  Medications: Prior to Admission medications   Medication Sig Start Date End Date Taking?  Authorizing Provider  amLODipine (NORVASC) 5 MG tablet Take 1 tablet (5 mg total) by mouth daily. 06/25/20 09/23/20  Calvin Rakes, MD  aspirin EC 81 MG tablet Take 81 mg by mouth every morning. Reported on 12/19/2015    [provider]  citalopram (CELEXA) 20 MG tablet Take 1 tablet (20 mg total) by mouth daily. 03/11/20   Calvin Rakes, MD   diclofenac (VOLTAREN) 75 MG EC tablet Take 1 tablet (75 mg total) by mouth 2 (two) times daily as needed. 03/29/20   Calvin Dubin, PA-C  omeprazole (PRILOSEC) 40 MG capsule TAKE 1 CAPSULE (40 MG TOTAL) BY MOUTH DAILY. Patient not taking: Reported on 03/11/2020 05/17/19   Calvin Park, MD  traMADol (ULTRAM) 50 MG tablet Take 1 tablet (50 mg total) by mouth every 6 (six) hours as needed. Patient not taking: Reported on 03/11/2020 10/31/19   Calvin July, NP     Family History  Problem Relation Age of Onset  . Cancer Mother        type unknown  . Emphysema Brother   . Colon cancer Neg Hx   . Esophageal cancer Neg Hx   . Pancreatic cancer Neg Hx     Social History   Socioeconomic History  . Marital status: Single    Spouse name: Not on file  . Number of children: 1  . Years of education: Not on file  . Highest education level: Not on file  Occupational History  . Occupation: works in Museum/gallery conservator  . Smoking status: Current Some Day Smoker    Packs/day: 1.00    Years: 50.00    Pack years: 50.00    Types: Cigarettes  . Smokeless tobacco: Never Used  . Tobacco comment: trying to quit, smoke every other day  Vaping Use  . Vaping Use: Never used  Substance and Sexual Activity  . Alcohol use: Yes    Alcohol/week: 0.0 standard drinks    Comment: 2-3 40 oz beers per day, x50 years  . Drug use: No    Comment: remote IV drug use age 43   . Sexual activity: Yes    Partners: Female, Male    Comment: patient declines  Other Topics Concern  . Not on file  Social History Narrative  . Not on file   Social Determinants of Health   Financial Resource Strain: Not on file  Food Insecurity: Not on file  Transportation Needs: Not on file  Physical Activity: Not on file  Stress: Not on file  Social Connections: Not on file    ECOG Status: 0 - Asymptomatic  Review of Systems  Review of Systems: A 12 point ROS discussed and pertinent positives are indicated in the  HPI above.  All other systems are negative.  Physical Exam No direct physical exam was performed (except for noted visual exam findings with Video Visits).   Vital Signs: There were no vitals taken for this visit.  Imaging: MR ABDOMEN WWO CONTRAST  Result Date: 07/31/2020 CLINICAL DATA:  Follow-up hepatocellular carcinoma. Status post chemoembolization. EXAM: MRI ABDOMEN WITHOUT AND WITH CONTRAST TECHNIQUE: Multiplanar multisequence MR imaging of the abdomen was performed both before and after the administration of intravenous contrast. CONTRAST:  82mL GADAVIST GADOBUTROL 1 MMOL/ML IV SOLN COMPARISON:  01/26/2020 FINDINGS: Lower chest: No acute findings. Hepatobiliary: Image degradation by motion artifact noted on today's exam. A mass is again seen along the medial border of the inferior right lobe, measuring 3.2 x 3.1 cm (  image 42/29), without significant change when remeasured on previous study. Nodular soft tissue enhancement is again seen along the medial wall of this mass, which measures 2.9 x 0.8 cm on image 42/28, also without significant change compared to previous study. No other hepatic lesions are seen on today's exam. Prior cholecystectomy. No evidence of biliary obstruction. Pancreas:  No mass or inflammatory changes. Spleen:  Within normal limits in size and appearance. Adrenals/Urinary Tract: No masses identified. Tiny cyst again seen in upper pole of right kidney. No evidence of hydronephrosis. Stomach/Bowel: Visualized portion unremarkable. Vascular/Lymphatic: No pathologically enlarged lymph nodes identified. No abdominal aortic aneurysm. Other:  None. Musculoskeletal:  No suspicious bone lesions identified. IMPRESSION: Image degradation by motion artifact noted. No significant change in right hepatic lobe mass status post chemoembolization. Nodular soft tissue enhancement along its medial wall remains stable, but is suspicious for residual carcinoma. Recommend continued follow-up by MRI  in 6 months. No evidence of new or progressive disease. Electronically Signed   By: Marlaine Hind M.D.   On: 07/31/2020 11:02    Labs:  CBC: Recent Labs    01/15/20 0815 08/12/20 0759  WBC 9.3 10.3  HGB 15.4 14.9  HCT 48.2 44.7  PLT 276 301    COAGS: Recent Labs    01/15/20 0815 08/12/20 0759  INR 0.9 1.0    BMP: Recent Labs    10/12/19 1123 01/15/20 0815 08/12/20 0759  NA  --  138 137  K  --  5.4* 4.6  CL  --  102 102  CO2  --  27 26  GLUCOSE  --  113* 86  BUN  --  6* 10  CALCIUM  --  10.1 10.3  CREATININE 0.80 0.70 0.86  GFRNONAA  --  >60 90  GFRAA  --  >60 104    LIVER FUNCTION TESTS: Recent Labs    01/15/20 0815 08/12/20 0759  BILITOT 0.5 0.4  AST 24 29  ALT 14 12  ALKPHOS 58  --   PROT 7.9 7.6  ALBUMIN 4.3  --     TUMOR MARKERS: Recent Labs    08/12/20 0759  AFPTM 1.8    Assessment and Plan:  Continuing to do well now 2 years status post drug-eluting bead chemoembolization of segment 6 hepatocellular carcinoma.  There is some concern of some stable nodular enhancement along the medial aspect of the treatment zone which warrants close attention on follow-up imaging.  1.)  Next MRI abdomen with gadolinium contrast and accompanying clinic visit in 6 months. (June 2022). 2.)  Probable new inguinal hernia.  I explained to him to keep an eye on this.  If the hernia becomes incarcerated or more symptomatic over time he may need to see a general surgeon for further evaluation.   Electronically Signed: Criselda Peaches 08/14/2020, 2:56 PM   I spent a total of 15 Minutes in remote  clinical consultation, greater than 50% of which was counseling/coordinating care for hepatocellular carcinoma.    Visit type: Audio only (telephone). Audio (no video) only due to patient preference. Alternative for in-person consultation at [facility], [address] [place], [place], [place]. This visit type was conducted due to national recommendations for  restrictions regarding the COVID-19 Pandemic (e.g. social distancing).  This format is felt to be most appropriate for this patient at this time.  All issues noted in this document were discussed and addressed.

## 2020-09-27 MED FILL — AMLODIPINE BESYLATE 5 MG TA: 5 | 30 days supply | Qty: 30 | Fill #2

## 2020-11-11 ENCOUNTER — Other Ambulatory Visit: Payer: Self-pay

## 2020-11-11 MED FILL — Amlodipine Besylate Tab 5 MG (Base Equivalent): ORAL | 90 days supply | Qty: 90 | Fill #0 | Status: AC

## 2020-12-24 DIAGNOSIS — Z23 Encounter for immunization: Secondary | ICD-10-CM | POA: Diagnosis not present

## 2021-01-08 ENCOUNTER — Other Ambulatory Visit: Payer: Self-pay | Admitting: Interventional Radiology

## 2021-01-08 DIAGNOSIS — C220 Liver cell carcinoma: Secondary | ICD-10-CM

## 2021-01-24 ENCOUNTER — Ambulatory Visit (HOSPITAL_COMMUNITY): Payer: Medicare Other

## 2021-01-28 ENCOUNTER — Ambulatory Visit (HOSPITAL_COMMUNITY): Admission: RE | Admit: 2021-01-28 | Payer: Medicare Other | Source: Ambulatory Visit

## 2021-01-29 ENCOUNTER — Telehealth: Payer: Medicare Other

## 2021-02-06 ENCOUNTER — Ambulatory Visit (HOSPITAL_COMMUNITY)
Admission: RE | Admit: 2021-02-06 | Discharge: 2021-02-06 | Disposition: A | Discharge status: Home / Self Care | Payer: Medicare Other | Source: Ambulatory Visit | Attending: Interventional Radiology | Admitting: Interventional Radiology

## 2021-02-06 ENCOUNTER — Other Ambulatory Visit: Payer: Self-pay

## 2021-02-06 DIAGNOSIS — C220 Liver cell carcinoma: Secondary | ICD-10-CM

## 2021-02-06 DIAGNOSIS — K769 Liver disease, unspecified: Secondary | ICD-10-CM | POA: Diagnosis not present

## 2021-02-06 MED ORDER — GADOBUTROL 1 MMOL/ML IV SOLN
5.0000 mL | Freq: Once | INTRAVENOUS | Status: AC | PRN
  Administered 2021-02-06: 5 mL via INTRAVENOUS

## 2021-02-26 ENCOUNTER — Telehealth: Payer: Self-pay | Admitting: Family Medicine

## 2021-02-26 ENCOUNTER — Ambulatory Visit (INDEPENDENT_AMBULATORY_CARE_PROVIDER_SITE_OTHER): Payer: Medicare Other | Admitting: Nurse Practitioner

## 2021-02-26 VITALS — BP 124/83 | HR 90 | Temp 97.9°F | Resp 18 | Ht 66.0 in | Wt 117.0 lb

## 2021-02-26 DIAGNOSIS — Z0000 Encounter for general adult medical examination without abnormal findings: Secondary | ICD-10-CM

## 2021-02-26 NOTE — Telephone Encounter (Signed)
Pt is needing to be schedule an appointment with PCP

## 2021-02-26 NOTE — Progress Notes (Signed)
Subjective:   Calvin Gardner is a 70 y.o. male who presents for Medicare Annual/Subsequent preventive examination.  Review of Systems    Review of Systems  Constitutional: Negative.   HENT: Negative.    Eyes: Negative.   Respiratory: Negative.    Cardiovascular: Negative.   Gastrointestinal: Negative.   Genitourinary: Negative.   Musculoskeletal: Negative.   Skin: Negative.   Neurological: Negative.   Endo/Heme/Allergies: Negative.   Psychiatric/Behavioral: Negative.     Cardiac Risk Factors include: advanced age (>75mn, >>62women);male gender;hypertension;smoking/ tobacco exposure     Objective:    Today's Vitals   02/26/21 1328 02/26/21 1329  BP: 124/83   Pulse: 90   Resp: 18   Temp: 97.9 F (36.6 C)   SpO2: 100%   Weight: 117 lb (53.1 kg)   Height: '5\' 6"'$  (1.676 m)   PainSc: 0-No pain 0-No pain   Body mass index is 18.88 kg/m.  Advanced Directives 02/26/2021 07/19/2018 05/16/2018 03/12/2017 02/28/2017 10/29/2015 01/29/2014  Does Patient Have a Medical Advance Directive? No No No No No No Patient does not have advance directive;Patient would not like information  Would patient like information on creating a medical advance directive? No - Patient declined No - Patient declined No - Patient declined - No - Patient declined - -    Current Medications (verified) Outpatient Encounter Medications as of 02/26/2021  Medication Sig   amLODipine (NORVASC) 5 MG tablet Take 1 tablet (5 mg total) by mouth daily.   amLODipine (NORVASC) 5 MG tablet TAKE 1 TABLET (5 MG TOTAL) BY MOUTH DAILY.   amLODipine (NORVASC) 5 MG tablet TAKE 1 TABLET (5 MG TOTAL) BY MOUTH DAILY.   aspirin EC 81 MG tablet Take 81 mg by mouth every morning. Reported on 12/19/2015   citalopram (CELEXA) 20 MG tablet Take 1 tablet (20 mg total) by mouth daily.   diclofenac (VOLTAREN) 75 MG EC tablet Take 1 tablet (75 mg total) by mouth 2 (two) times daily as needed.   omeprazole (PRILOSEC) 40 MG capsule TAKE 1  CAPSULE (40 MG TOTAL) BY MOUTH DAILY. (Patient not taking: Reported on 03/11/2020)   traMADol (ULTRAM) 50 MG tablet Take 1 tablet (50 mg total) by mouth every 6 (six) hours as needed. (Patient not taking: Reported on 03/11/2020)   No facility-administered encounter medications on file as of 02/26/2021.    Allergies (verified) Patient has no known allergies.   History: Past Medical History:  Diagnosis Date   [place]    [place] ([place]    Arthritis    [place]cer ([place]    liver   Cataract    Hepatitis C    HLD (hyperlipidemia)    Hypertension    Liver mass    Pulmonary nodule    Past Surgical History:  Procedure Laterality Date   COLONOSCOPY WITH PROPOFOL N/A 01/30/2014   Procedure: COLONOSCOPY WITH PROPOFOL;  Surgeon: MGarlan Fair MD;  [facility];  Service: Endoscopy;  Laterality: N/A;   ESOPHAGOGASTRODUODENOSCOPY (EGD) WITH PROPOFOL N/A 01/30/2014   Procedure: ESOPHAGOGASTRODUODENOSCOPY (EGD) WITH PROPOFOL;  Surgeon: MGarlan Fair MD;  [facility];  Service: Endoscopy;  Laterality: N/A;   IR ANGIOGRAM SELECTIVE EACH ADDITIONAL VESSEL  07/19/2018   IR ANGIOGRAM SELECTIVE EACH ADDITIONAL VESSEL  07/19/2018   IR ANGIOGRAM SELECTIVE EACH ADDITIONAL VESSEL  07/19/2018   IR ANGIOGRAM SELECTIVE EACH ADDITIONAL VESSEL  07/19/2018   IR ANGIOGRAM SELECTIVE EACH ADDITIONAL VESSEL  07/19/2018   IR ANGIOGRAM VISCERAL SELECTIVE  07/19/2018   IR EMBO TUMOR ORGAN ISCHEMIA INFARCT INC GUIDE ROADMAPPING  07/19/2018   IR RADIOLOGIST EVAL & MGMT  07/07/2018   IR RADIOLOGIST EVAL & MGMT  08/16/2018   IR RADIOLOGIST EVAL & MGMT  01/31/2019   IR RADIOLOGIST EVAL & MGMT  04/25/2019   IR RADIOLOGIST EVAL & MGMT  07/20/2019   IR RADIOLOGIST EVAL & MGMT  10/17/2019   IR RADIOLOGIST EVAL & MGMT  01/30/2020   IR RADIOLOGIST EVAL & MGMT  08/14/2020   IR US GUIDE VASC ACCESS LEFT  07/19/2018   left jaw surgery      MANDIBLE FRACTURE SURGERY     Family History   Problem Relation Age of Onset   Cancer Mother        type unknown   Emphysema Brother    Colon cancer Neg Hx    Esophageal cancer Neg Hx    Pancreatic cancer Neg Hx    Social History   Socioeconomic History   Marital status: Single    Spouse name: Not on file   Number of children: 1   Years of education: Not on file   Highest education level: Not on file  Occupational History   Occupation: works in Consulting civil engineer   Tobacco Use   Smoking status: Some Days    Packs/day: 1.00    Years: 50.00    Pack years: 50.00    Types: Cigarettes   Smokeless tobacco: Never   Tobacco comments:    trying to quit, smoke every other day  Vaping Use   Vaping Use: Never used  Substance and Sexual Activity   Alcohol use: Yes    Alcohol/week: 0.0 standard drinks    Comment: 2-3 40 oz beers per day, x50 years   Drug use: No    Comment: remote IV drug use age 61    Sexual activity: Yes    Partners: Female, Male    Comment: patient declines  Other Topics Concern   Not on file  Social History Narrative   Not on file   Social Determinants of Health   Financial Resource Strain: Low Risk    Difficulty of Paying Living Expenses: Not hard at all  Food Insecurity: No Food Insecurity   Worried About Charity fundraiser in the Last Year: Never true   [place] in the Last Year: Never true  Transportation Needs: No Transportation Needs   Lack of Transportation (Medical): No   Lack of Transportation (Non-Medical): No  Physical Activity: Sufficiently Active   Days of Exercise per Week: 5 days   Minutes of Exercise per Session: 40 min  Stress: No Stress Concern Present   Feeling of Stress : Not at all  Social Connections: Socially Isolated   Frequency of Communication with Friends and Family: More than three times a week   Frequency of Social Gatherings with Friends and Family: Never   Attends Religious Services: Never   Marine scientist or Organizations: No   Attends Programme researcher, broadcasting/film/video: Never   Marital Status: Divorced    Tobacco Counseling Ready to quit: No Counseling given: Yes Tobacco comments: trying to quit, smoke every other day   Clinical Intake:  Pre-visit preparation completed: No  Pain : No/denies pain Pain Score: 0-No pain     BMI - recorded: 18.88 Nutritional Status: BMI <19  Underweight Nutritional Risks: Unintentional weight loss Diabetes: No  How often do you need to have someone help you when you  read instructions, pamphlets, or other written materials from your doctor or pharmacy?: 1 - Never What is the last grade level you completed in school?: 10th  Diabetic?no  Interpreter Needed?: No      Activities of Daily Living In your present state of health, do you have any difficulty performing the following activities: 02/26/2021  Hearing? N  Vision? N  Difficulty concentrating or making decisions? N  Walking or climbing stairs? N  Dressing or bathing? N  Doing errands, shopping? N  Preparing Food and eating ? N  Using the Toilet? N  In the past six months, have you accidently leaked urine? N  Do you have problems with loss of bowel control? N  Managing your Medications? N  Managing your Finances? N  Housekeeping or managing your Housekeeping? N  Some recent data might be hidden    Patient Care Team: Charlott Rakes, MD as PCP - General (Family Medicine) Criselda Peaches, MD as Consulting Physician (Interventional Radiology)  Indicate any recent Medical Services you may have received from other than [facility] providers in the past year (date may be approximate).     Assessment:   This is a routine wellness examination for Calvin Gardner.   Dietary issues and exercise activities discussed: Current Exercise Habits: The patient has a physically strenuous job, but has no regular exercise apart from work.   Goals Addressed   None    Depression Screen PHQ 2/9 Scores 02/26/2021 06/25/2020 03/11/2020 08/10/2019  08/31/2018 05/16/2018 01/10/2018  PHQ - 2 Score 0 0 6 0 0 0 0  PHQ- 9 Score - 0 27 0 2 4 0    Fall Risk Fall Risk  02/26/2021 06/25/2020 03/11/2020 08/31/2018 05/16/2018  Falls in the past year? 0 0 0 0 Yes  Number falls in past yr: 0 0 - - 2 or more  Injury with Fall? 0 0 - - No  Risk for fall due to : No Fall Risks - No Fall Risks - -    FALL RISK PREVENTION PERTAINING TO THE HOME:  Any stairs in or around the home? Yes  If so, are there any without handrails? No  Home free of loose throw rugs in walkways, pet beds, electrical cords, etc? Yes  Adequate lighting in your home to reduce risk of falls? Yes   ASSISTIVE DEVICES UTILIZED TO PREVENT FALLS:  Life alert? No  Use of a cane, walker or w/c? No  Grab bars in the bathroom? No  Shower chair or bench in shower? No  Elevated toilet seat or a handicapped toilet? No   TIMED UP AND GO:  Was the test performed? Yes .  Length of time to ambulate 10 feet: 3 sec.   Gait steady and fast without use of assistive device  Cognitive Function:        Immunizations Immunization History  Administered Date(s) Administered   Hepatitis A, Adult 09/19/2014   Influenza,inj,Quad PF,6+ Mos 06/20/2015, 06/11/2017, 08/31/2018, 08/10/2019, 07/02/2020   Pneumococcal Conjugate-13 08/31/2018   Pneumococcal Polysaccharide-23 07/06/2014    TDAP status: Due, Education has been provided regarding the importance of this vaccine. Advised may receive this vaccine at local pharmacy or Health Dept. Aware to provide a copy of the vaccination record if obtained from local pharmacy or Health Dept. Verbalized acceptance and understanding.  Flu Vaccine status: Up to date  Pneumococcal vaccine status: Due, Education has been provided regarding the importance of this vaccine. Advised may receive this vaccine at local pharmacy or Health Dept. Aware  to provide a copy of the vaccination record if obtained from local pharmacy or Health Dept. Verbalized acceptance and  understanding.  Covid-19 vaccine status: Completed vaccines  Qualifies for Shingles Vaccine? Yes   Zostavax completed No   Shingrix Completed?: No.    Education has been provided regarding the importance of this vaccine. Patient has been advised to call insurance company to determine out of pocket expense if they have not yet received this vaccine. Advised may also receive vaccine at local pharmacy or Health Dept. Verbalized acceptance and understanding.  Screening Tests Health Maintenance  Topic Date Due   COVID-19 Vaccine (1) Never done   TETANUS/TDAP  Never done   Zoster Vaccines- Shingrix (1 of 2) Never done   PNA vac Low Risk Adult (2 of 2 - PPSV23) 09/01/2019   INFLUENZA VACCINE  03/03/2021   COLONOSCOPY (Pts 45-41yr Insurance coverage will need to be confirmed)  01/31/2024   Hepatitis C Screening  Completed   HPV VACCINES  Aged Out    Health Maintenance  Health Maintenance Due  Topic Date Due   COVID-19 Vaccine (1) Never done   TETANUS/TDAP  Never done   Zoster Vaccines- Shingrix (1 of 2) Never done   PNA vac Low Risk Adult (2 of 2 - PPSV23) 09/01/2019    Colorectal cancer screening: Type of screening: Colonoscopy. Completed  . Repeat every 5 years  Lung Cancer Screening: (Low Dose CT Chest recommended if Age 68-80years, 30 pack-year currently smoking OR have quit w/in 15years.) does not qualify.   Lung Cancer Screening Referral: NA  Additional Screening:  Hepatitis C Screening: does qualify; Completed yes  Vision Screening: Recommended annual ophthalmology exams for early detection of glaucoma and other disorders of the eye. Is the patient up to date with their annual eye exam?  No  Who is the provider or what is the name of the office in which the patient attends annual eye exams? na If pt is not established with a provider, would they like to be referred to a provider to establish care? No .   Dental Screening: Recommended annual dental exams for proper oral  hygiene  Community Resource Referral / Chronic Care Management: CRR required this visit?  No   CCM required this visit?  No      Plan:     I have personally reviewed and noted the following in the patient's chart:   Medical and social history Use of alcohol, tobacco or illicit drugs  Current medications and supplements including opioid prescriptions. Patient is not currently taking opioid prescriptions. Functional ability and status Nutritional status Physical activity Advanced directives List of other physicians Hospitalizations, surgeries, and ER visits in previous 12 months Vitals Screenings to include cognitive, depression, and falls Referrals and appointments  In addition, I have reviewed and discussed with patient certain preventive protocols, quality metrics, and best practice recommendations. A written personalized care plan for preventive services as well as general preventive health recommendations were provided to patient.     TFenton Foy NP   02/26/2021

## 2021-02-26 NOTE — Patient Instructions (Addendum)
Calvin Gardner , Thank you for taking time to come for your Medicare Wellness Visit. I appreciate your ongoing commitment to your health goals. Please review the following plan we discussed and let me know if I can assist you in the future.   These are the goals we discussed:  Goals      Blood Pressure < 140/90     Quit smoking / using tobacco        This is a list of the screening recommended for you and due dates:  Health Maintenance  Topic Date Due   COVID-19 Vaccine (1) Never done   Tetanus Vaccine  Never done   Zoster (Shingles) Vaccine (1 of 2) Never done   Pneumonia vaccines (2 of 2 - PPSV23) 09/01/2019   Flu Shot  03/03/2021   Colon Cancer Screening  01/31/2024   Hepatitis C Screening: USPSTF Recommendation to screen - Ages 18-79 yo.  Completed   HPV Vaccine  Aged Out    Fall Prevention in the Home, Adult Falls can cause injuries and can happen to people of all ages. There are many things you can do to make your home safe and to help prevent falls. Ask forhelp when making these changes. What actions can I take to prevent falls? General Instructions Use good lighting in all rooms. Replace any light bulbs that burn out. Turn on the lights in dark areas. Use night-lights. Keep items that you use often in easy-to-reach places. Lower the shelves around your home if needed. Set up your furniture so you have a clear path. Avoid moving your furniture around. Do not have throw rugs or other things on the floor that can make you trip. Avoid walking on wet floors. If any of your floors are uneven, fix them. Add color or contrast paint or tape to clearly mark and help you see: Grab bars or handrails. First and last steps of staircases. Where the edge of each step is. If you use a stepladder: Make sure that it is fully opened. Do not climb a closed stepladder. Make sure the sides of the stepladder are locked in place. Ask someone to hold the stepladder while you use it. Know  where your pets are when moving through your home. What can I do in the bathroom?     Keep the floor dry. Clean up any water on the floor right away. Remove soap buildup in the tub or shower. Use nonskid mats or decals on the floor of the tub or shower. Attach bath mats securely with double-sided, nonslip rug tape. If you need to sit down in the shower, use a plastic, nonslip stool. Install grab bars by the toilet and in the tub and shower. Do not use towel bars as grab bars. What can I do in the bedroom? Make sure that you have a light by your bed that is easy to reach. Do not use any sheets or blankets for your bed that hang to the floor. Have a firm chair with side arms that you can use for support when you get dressed. What can I do in the kitchen? Clean up any spills right away. If you need to reach something above you, use a step stool with a grab bar. Keep electrical cords out of the way. Do not use floor polish or wax that makes floors slippery. What can I do with my stairs? Do not leave any items on the stairs. Make sure that you have a light switch at the  top and the bottom of the stairs. Make sure that there are handrails on both sides of the stairs. Fix handrails that are broken or loose. Install nonslip stair treads on all your stairs. Avoid having throw rugs at the top or bottom of the stairs. Choose a carpet that does not hide the edge of the steps on the stairs. Check carpeting to make sure that it is firmly attached to the stairs. Fix carpet that is loose or worn. What can I do on the outside of my home? Use bright outdoor lighting. Fix the edges of walkways and driveways and fix any cracks. Remove anything that might make you trip as you walk through a door, such as a raised step or threshold. Trim any bushes or trees on paths to your home. Check to see if handrails are loose or broken and that both sides of all steps have handrails. Install guardrails along the  edges of any raised decks and porches. Clear paths of anything that can make you trip, such as tools or rocks. Have leaves, snow, or ice cleared regularly. Use sand or salt on paths during winter. Clean up any spills in your garage right away. This includes grease or oil spills. What other actions can I take? Wear shoes that: Have a low heel. Do not wear high heels. Have rubber bottoms. Feel good on your feet and fit well. Are closed at the toe. Do not wear open-toe sandals. Use tools that help you move around if needed. These include: Canes. Walkers. Scooters. Crutches. Review your medicines with your doctor. Some medicines can make you feel dizzy. This can increase your chance of falling. Ask your doctor what else you can do to help prevent falls. Where to find more information Centers for Disease Control and Prevention, [employer]: http://www.wolf.info/ National Institute on Aging: http://kim-miller.com/ Contact a doctor if: You are afraid of falling at home. You feel weak, drowsy, or dizzy at home. You fall at home. Summary There are many simple things that you can do to make your home safe and to help prevent falls. Ways to make your home safe include removing things that can make you trip and installing grab bars in the bathroom. Ask for help when making these changes in your home. This information is not intended to replace advice given to you by your health care provider. Make sure you discuss any questions you have with your healthcare provider. Document Revised: 02/21/2020 Document Reviewed: 02/21/2020 Elsevier Patient Education  [place] Maintenance, Male Adopting a healthy lifestyle and getting preventive care are important in promoting health and wellness. Ask your health care provider about: The right schedule for you to have regular tests and exams. Things you can do on your own to prevent diseases and keep yourself healthy. What should I know about diet, weight, and  exercise? Eat a healthy diet  Eat a diet that includes plenty of vegetables, fruits, low-fat dairy products, and lean protein. Do not eat a lot of foods that are high in solid fats, added sugars, or sodium.  Maintain a healthy weight Body mass index (BMI) is a measurement that can be used to identify possible weight problems. It estimates body fat based on height and weight. Your health care provider can help determine your BMI and help you achieve or maintain ahealthy weight. Get regular exercise Get regular exercise. This is one of the most important things you can do for your health. Most adults should: Exercise for at least  150 minutes each week. The exercise should increase your heart rate and make you sweat (moderate-intensity exercise). Do strengthening exercises at least twice a week. This is in addition to the moderate-intensity exercise. Spend less time sitting. Even light physical activity can be beneficial. Watch cholesterol and blood lipids Have your blood tested for lipids and cholesterol at 68 years of age, then havethis test every 5 years. You may need to have your cholesterol levels checked more often if: Your lipid or cholesterol levels are high. You are older than 68 years of age. You are at high risk for heart disease. What should I know about cancer screening? Many types of cancers can be detected early and may often be prevented. Depending on your health history and family history, you may need to have cancer screening at various ages. This may include screening for: Colorectal cancer. Prostate cancer. Skin cancer. Lung cancer. What should I know about heart disease, diabetes, and high blood pressure? Blood pressure and heart disease High blood pressure causes heart disease and increases the risk of stroke. This is more likely to develop in people who have high blood pressure readings, are of African descent, or are overweight. Talk with your health care provider  about your target blood pressure readings. Have your blood pressure checked: Every 3-5 years if you are 33-33 years of age. Every year if you are 71 years old or older. If you are between the ages of 34 and 21 and are a current or former smoker, ask your health care provider if you should have a one-time screening for abdominal aortic aneurysm (AAA). Diabetes Have regular diabetes screenings. This checks your fasting blood sugar level. Have the screening done: Once every three years after age 20 if you are at a normal weight and have a low risk for diabetes. More often and at a younger age if you are overweight or have a high risk for diabetes. What should I know about preventing infection? Hepatitis B If you have a higher risk for hepatitis B, you should be screened for this virus. Talk with your health care provider to find out if you are at risk forhepatitis B infection. Hepatitis C Blood testing is recommended for: Everyone born from [birth year] through 1963. Anyone with known risk factors for hepatitis C. Sexually transmitted infections (STIs) You should be screened each year for STIs, including gonorrhea and chlamydia, if: You are sexually active and are younger than 68 years of age. You are older than 68 years of age and your health care provider tells you that you are at risk for this type of infection. Your sexual activity has changed since you were last screened, and you are at increased risk for chlamydia or gonorrhea. Ask your health care provider if you are at risk. Ask your health care provider about whether you are at high risk for HIV. Your health care provider may recommend a prescription medicine to help prevent HIV infection. If you choose to take medicine to prevent HIV, you should first get tested for HIV. You should then be tested every 3 months for as long as you are taking the medicine. Follow these instructions at home: Lifestyle Do not use any products that contain nicotine  or tobacco, such as cigarettes, e-cigarettes, and chewing tobacco. If you need help quitting, ask your health care provider. Do not use street drugs. Do not share needles. Ask your health care provider for help if you need support or information about quitting drugs. Alcohol use  Do not drink alcohol if your health care provider tells you not to drink. If you drink alcohol: Limit how much you have to 0-2 drinks a day. Be aware of how much alcohol is in your drink. In the [place] one drink equals one 12 oz bottle of beer (355 mL), one 5 oz glass of wine (148 mL), or one 1 oz glass of hard liquor (44 mL). General instructions Schedule regular health, dental, and eye exams. Stay current with your vaccines. Tell your health care provider if: You often feel depressed. You have ever been abused or do not feel safe at home. Summary Adopting a healthy lifestyle and getting preventive care are important in promoting health and wellness. Follow your health care provider's instructions about healthy diet, exercising, and getting tested or screened for diseases. Follow your health care provider's instructions on monitoring your cholesterol and blood pressure. This information is not intended to replace advice given to you by your health care provider. Make sure you discuss any questions you have with your healthcare provider. Document Revised: 07/13/2018 Document Reviewed: 07/13/2018 Elsevier Patient Education  2022 [employer].

## 2021-02-26 NOTE — Telephone Encounter (Signed)
Can you please assist in scheduling an appointment for this patient for follow-up of medical conditions?  Thank you.

## 2021-02-27 NOTE — Telephone Encounter (Signed)
Patient has been scheduled 8/24

## 2021-03-19 ENCOUNTER — Other Ambulatory Visit: Payer: Self-pay

## 2021-03-19 MED FILL — Amlodipine Besylate Tab 5 MG (Base Equivalent): ORAL | 90 days supply | Qty: 90 | Fill #0 | Status: AC

## 2021-03-21 ENCOUNTER — Other Ambulatory Visit: Payer: Self-pay

## 2021-03-26 ENCOUNTER — Ambulatory Visit: Payer: Medicare Other | Attending: Family Medicine | Admitting: Family Medicine

## 2021-03-26 ENCOUNTER — Other Ambulatory Visit: Payer: Self-pay

## 2021-03-26 ENCOUNTER — Encounter: Payer: Self-pay | Admitting: Family Medicine

## 2021-03-26 VITALS — BP 115/66 | HR 77 | Ht 66.0 in | Wt 113.4 lb

## 2021-03-26 DIAGNOSIS — Z8505 Personal history of malignant neoplasm of liver: Secondary | ICD-10-CM | POA: Diagnosis not present

## 2021-03-26 DIAGNOSIS — F17200 Nicotine dependence, unspecified, uncomplicated: Secondary | ICD-10-CM | POA: Diagnosis not present

## 2021-03-26 DIAGNOSIS — K219 Gastro-esophageal reflux disease without esophagitis: Secondary | ICD-10-CM | POA: Diagnosis not present

## 2021-03-26 DIAGNOSIS — Z7982 Long term (current) use of aspirin: Secondary | ICD-10-CM | POA: Insufficient documentation

## 2021-03-26 DIAGNOSIS — I10 Essential (primary) hypertension: Secondary | ICD-10-CM | POA: Diagnosis not present

## 2021-03-26 DIAGNOSIS — F17210 Nicotine dependence, cigarettes, uncomplicated: Secondary | ICD-10-CM | POA: Insufficient documentation

## 2021-03-26 DIAGNOSIS — R066 Hiccough: Secondary | ICD-10-CM | POA: Diagnosis not present

## 2021-03-26 DIAGNOSIS — Z79899 Other long term (current) drug therapy: Secondary | ICD-10-CM | POA: Insufficient documentation

## 2021-03-26 DIAGNOSIS — Z23 Encounter for immunization: Secondary | ICD-10-CM

## 2021-03-26 MED ORDER — BUPROPION HCL ER (XL) 150 MG PO TB24
150.0000 mg | ORAL_TABLET | Freq: Every day | ORAL | 3 refills | Status: DC
  Filled 2021-03-26: qty 30, 30d supply, fill #0

## 2021-03-26 MED ORDER — BACLOFEN 10 MG PO TABS
10.0000 mg | ORAL_TABLET | Freq: Three times a day (TID) | ORAL | 0 refills | Status: DC | PRN
  Filled 2021-03-26: qty 30, 10d supply, fill #0

## 2021-03-26 NOTE — Patient Instructions (Signed)
Hiccups  A hiccup is the result of a sudden irritation of a muscle that is used for breathing (diaphragm). The diaphragm is located under your lungs and above your stomach. When the diaphragm gets irritated, it may quickly tighten without your control (have a spasm). The spasm causes you to quickly suck in air, and that causes your vocal cords to close together quickly. These reactions cause the hiccup sound. Hiccups usually last only a short amount of time (less than 48 hours). In unusual cases, they can last for days or months and require you to see yourhealth care provider. Common causes of hiccups include: Eating too fast or eating too much food. Drinking alcohol or bubbly (carbonated) drinks. Eating or drinking hot or spicy foods and drinks. Swallowing extra air when sucking on candy or a straw or when chewing on gum. Feeling nervous, stressed, or excited. Having certain conditions that irritate the diaphragm nerves. Having metabolic or nervous system disorders. Follow these instructions at home: To prevent hiccups or lessen discomfort from hiccups: Eat and chew your food slowly. Eat small meals, and avoid overeating. If you drink alcohol: Limit how much you have to: 0-1 drink a day for women who are not pregnant. 0-2 drinks a day for men. Know how much alcohol is in a drink. In the [place] one drink equals one 12 oz bottle of beer (355 mL), one 5 oz glass of wine (148 mL), or one 1 oz glass of hard liquor (44 mL). Limit your drinking of carbonated or fizzy drinks, such as soda. Avoid eating or drinking hot or spicy foods and drinks. General instructions Watch for any changes in your hiccups. Take over-the-counter and prescription medicines only as told by your health care provider. Contact a health care provider if: Your hiccups last for more than 48 hours. Your hiccups do not improve with treatment. You cannot sleep or eat because of your hiccups. You have unexpected weight loss  because of your hiccups. You have numbness, tingling, or weakness. Get help right away if: You have trouble breathing or swallowing. You have severe pain in your abdomen. These symptoms may represent a serious problem that is an emergency. Do not wait to see if the symptoms will go away. Get medical help right away. Call your local emergency services (911 in the [place] Do not drive yourself to the hospital. Summary A hiccup is the result of a sudden irritation of a muscle that is used for breathing (diaphragm). Hiccups can be caused by many things, including eating too fast. Call your health care provider if your hiccups last for more than 48 hours. This information is not intended to replace advice given to you by your health care provider. Make sure you discuss any questions you have with your healthcare provider. Document Revised: 03/22/2020 Document Reviewed: 03/22/2020 Elsevier Patient Education  [place].

## 2021-03-26 NOTE — Progress Notes (Signed)
Subjective:  Patient ID: Calvin Gardner, male    DOB: March 28, 1951  Age: 70 y.o. MRN: [id number]  CC: Hypertension   HPI Calvin Gardner is a 70 y.o. year old male with a history of hypertension, GERD, tobacco and diffuse alcohol abuse, diagnosed with hepatocellular carcinoma (stage IIIa cT3,cN0,cM0) in the fall of last year s/p IR transcatheter chemo embolization of the liver mass in 07/2018.  Interval History: Complains he still has hiccups and Omeprazole has been ineffective. This prevents him from eating He is currently under surveillance with oncology service due to history of hepatocellular carcinoma. MRI abdomen with and without contrast from 01/2021 revealed: IMPRESSION: 1. No change in volume of RIGHT hepatic lobe hepatocellular carcinoma following chemoembolization. 2. Persistent enhancement along the medial border of the lesion concerning for residual albeit nonprogressive carcinoma. 3. No sites of new malignancy within the liver.  He smokes 3-4 cig/day and is willing to work on quitting. Denies additional concerns today.  Doing well on amlodipine for his hypertension. Past Medical History:  Diagnosis Date   Alcoholism ([place])    Aortic atherosclerosis ([place])    Arthritis    Cancer ([facility])    liver   Cataract    Hepatitis C    HLD (hyperlipidemia)    Hypertension    Liver mass    Pulmonary nodule     Past Surgical History:  Procedure Laterality Date   COLONOSCOPY WITH PROPOFOL N/A 01/30/2014   Procedure: COLONOSCOPY WITH PROPOFOL;  Surgeon: Garlan Fair, MD;  Location: [facility];  Service: Endoscopy;  Laterality: N/A;   ESOPHAGOGASTRODUODENOSCOPY (EGD) WITH PROPOFOL N/A 01/30/2014   Procedure: ESOPHAGOGASTRODUODENOSCOPY (EGD) WITH PROPOFOL;  Surgeon: Garlan Fair, MD;  Location: [facility];  Service: Endoscopy;  Laterality: N/A;   IR ANGIOGRAM SELECTIVE EACH ADDITIONAL VESSEL  07/19/2018   IR ANGIOGRAM SELECTIVE EACH ADDITIONAL VESSEL  07/19/2018   IR  ANGIOGRAM SELECTIVE EACH ADDITIONAL VESSEL  07/19/2018   IR ANGIOGRAM SELECTIVE EACH ADDITIONAL VESSEL  07/19/2018   IR ANGIOGRAM SELECTIVE EACH ADDITIONAL VESSEL  07/19/2018   IR ANGIOGRAM VISCERAL SELECTIVE  07/19/2018   IR EMBO TUMOR ORGAN ISCHEMIA INFARCT INC GUIDE ROADMAPPING  07/19/2018   IR RADIOLOGIST EVAL & MGMT  07/07/2018   IR RADIOLOGIST EVAL & MGMT  08/16/2018   IR RADIOLOGIST EVAL & MGMT  01/31/2019   IR RADIOLOGIST EVAL & MGMT  04/25/2019   IR RADIOLOGIST EVAL & MGMT  07/20/2019   IR RADIOLOGIST EVAL & MGMT  10/17/2019   IR RADIOLOGIST EVAL & MGMT  01/30/2020   IR RADIOLOGIST EVAL & MGMT  08/14/2020   IR US GUIDE VASC ACCESS LEFT  07/19/2018   left jaw surgery      MANDIBLE FRACTURE SURGERY      Family History  Problem Relation Age of Onset   Cancer Mother        type unknown   Emphysema Brother    Colon cancer Neg Hx    Esophageal cancer Neg Hx    Pancreatic cancer Neg Hx     No Known Allergies  Outpatient Medications Prior to Visit  Medication Sig Dispense Refill   amLODipine (NORVASC) 5 MG tablet TAKE 1 TABLET (5 MG TOTAL) BY MOUTH DAILY. 30 tablet 6   aspirin EC 81 MG tablet Take 81 mg by mouth every morning. Reported on 12/19/2015 (Patient not taking: Reported on 03/26/2021)     amLODipine (NORVASC) 5 MG tablet Take 1 tablet (5 mg total) by mouth daily. 30 tablet  6   amLODipine (NORVASC) 5 MG tablet TAKE 1 TABLET (5 MG TOTAL) BY MOUTH DAILY. 30 tablet 6   citalopram (CELEXA) 20 MG tablet Take 1 tablet (20 mg total) by mouth daily. (Patient not taking: Reported on 03/26/2021) 30 tablet 6   diclofenac (VOLTAREN) 75 MG EC tablet Take 1 tablet (75 mg total) by mouth 2 (two) times daily as needed. (Patient not taking: Reported on 03/26/2021) 60 tablet 1   omeprazole (PRILOSEC) 40 MG capsule TAKE 1 CAPSULE (40 MG TOTAL) BY MOUTH DAILY. (Patient not taking: No sig reported) 60 capsule 3   traMADol (ULTRAM) 50 MG tablet Take 1 tablet (50 mg total) by mouth every 6 (six) hours  as needed. (Patient not taking: No sig reported) 12 tablet 0   No facility-administered medications prior to visit.     ROS Review of Systems  Constitutional:  Negative for activity change and appetite change.  HENT:  Negative for sinus pressure and sore throat.   Eyes:  Negative for visual disturbance.  Respiratory:  Negative for cough, chest tightness and shortness of breath.   Cardiovascular:  Negative for chest pain and leg swelling.  Gastrointestinal:  Negative for abdominal distention, abdominal pain, constipation and diarrhea.  Endocrine: Negative.   Genitourinary:  Negative for dysuria.  Musculoskeletal:  Negative for joint swelling and myalgias.  Skin:  Negative for rash.  Allergic/Immunologic: Negative.   Neurological:  Negative for weakness, light-headedness and numbness.  Psychiatric/Behavioral:  Negative for dysphoric mood and suicidal ideas.    Objective:  BP 115/66   Pulse 77   Ht '5\' 6"'$  (1.676 m)   Wt 113 lb 6.4 oz (51.4 kg)   SpO2 99%   BMI 18.30 kg/m   BP/Weight 03/26/2021 02/26/2021 [id number]  Systolic BP [id number] -  Diastolic BP 66 83 -  Wt. (Lbs) 113.4 117 123.6  BMI 18.3 18.88 19.95      Physical Exam Constitutional:      Appearance: He is well-developed.  Cardiovascular:     Rate and Rhythm: Normal rate.     Heart sounds: Normal heart sounds. No murmur heard. Pulmonary:     Effort: Pulmonary effort is normal.     Breath sounds: Normal breath sounds. No wheezing or rales.  Chest:     Chest wall: No tenderness.  Abdominal:     General: Bowel sounds are normal. There is no distension.     Palpations: Abdomen is soft. There is no mass.     Tenderness: There is no abdominal tenderness.  Musculoskeletal:        General: Normal range of motion.     Right lower leg: No edema.     Left lower leg: No edema.  Neurological:     Mental Status: He is alert and oriented to person, place, and time.  Psychiatric:        Mood and Affect: Mood normal.     CMP Latest Ref Rng & Units 08/12/2020 01/15/2020 10/12/2019  Glucose 65 - 99 mg/dL 86 113(H) -  BUN 7 - 25 mg/dL 10 6(L) -  Creatinine 0.70 - 1.25 mg/dL 0.86 0.70 0.80  Sodium 135 - 146 mmol/L 137 138 -  Potassium 3.5 - 5.3 mmol/L 4.6 5.4(H) -  Chloride 98 - 110 mmol/L 102 102 -  CO2 20 - 32 mmol/L 26 27 -  Calcium 8.6 - 10.3 mg/dL 10.3 10.1 -  Total Protein 6.1 - 8.1 g/dL 7.6 7.9 -  Total Bilirubin 0.2 - 1.2 mg/dL  0.4 0.5 -  Alkaline Phos 38 - 126 U/L - 58 -  AST 10 - 35 U/L 29 24 -  ALT 9 - 46 U/L 12 14 -    Lipid Panel     Component Value Date/Time   CHOL 156 03/18/2017 0834   TRIG 52 03/18/2017 0834   HDL 72 03/18/2017 0834   CHOLHDL 2.2 03/18/2017 0834   CHOLHDL 2.2 01/05/2014 1620   VLDL 12 01/05/2014 1620   LDLCALC 74 03/18/2017 0834    CBC    Component Value Date/Time   WBC 10.3 08/12/2020 0759   RBC 5.02 08/12/2020 0759   HGB 14.9 08/12/2020 0759   HGB 13.5 06/12/2019 1156   HGB 13.0 05/16/2018 1219   HCT 44.7 08/12/2020 0759   HCT 39.1 05/16/2018 1219   PLT 301 08/12/2020 0759   PLT 266 06/12/2019 1156   PLT 199 05/16/2018 1219   MCV 89.0 08/12/2020 0759   MCV 91 05/16/2018 1219   MCH 29.7 08/12/2020 0759   MCHC 33.3 08/12/2020 0759   RDW 11.6 08/12/2020 0759   RDW 12.1 (L) 05/16/2018 1219   LYMPHSABS 2.3 06/12/2019 1156   LYMPHSABS 1.0 05/16/2018 1219   MONOABS 1.0 06/12/2019 1156   EOSABS 0.1 06/12/2019 1156   EOSABS 0.0 05/16/2018 1219   BASOSABS 0.0 06/12/2019 1156   BASOSABS 0.1 05/16/2018 1219    Lab Results  Component Value Date   HGBA1C 5.0 10/07/2017    Assessment & Plan:  1. Essential hypertension Controlled Continue amlodipine - Basic Metabolic Panel  2. Hiccoughs Chronic, uncontrolled Likely related to hepatocellular carcinoma Failed PPI We will place him on baclofen and if ineffective consider gabapentin - baclofen (LIORESAL) 10 MG tablet; Take 1 tablet (10 mg total) by mouth 3 (three) times daily as needed (for  hiccups).  Dispense: 30 each; Refill: 0  3. Smoking - buPROPion (WELLBUTRIN XL) 150 MG 24 hr tablet; Take 1 tablet (150 mg total) by mouth daily for smoking cessation  Dispense: 30 tablet; Refill: 3    Meds ordered this encounter  Medications   baclofen (LIORESAL) 10 MG tablet    Sig: Take 1 tablet (10 mg total) by mouth 3 (three) times daily as needed (for hiccups).    Dispense:  30 each    Refill:  0   buPROPion (WELLBUTRIN XL) 150 MG 24 hr tablet    Sig: Take 1 tablet (150 mg total) by mouth daily for smoking cessation    Dispense:  30 tablet    Refill:  3    Follow-up: Return in about 6 months (around 09/26/2021) for medical conditions.       Charlott Rakes, MD, FAAFP. [facility] [place], [place]   03/26/2021, 1:11 PM

## 2021-03-27 ENCOUNTER — Other Ambulatory Visit: Payer: Self-pay

## 2021-03-27 LAB — BASIC METABOLIC PANEL
BUN/Creatinine Ratio: 12 (ref 10–24)
BUN: 9 mg/dL (ref 8–27)
CO2: 22 mmol/L (ref 20–29)
Calcium: 9.9 mg/dL (ref 8.6–10.2)
Chloride: 100 mmol/L (ref 96–106)
Creatinine, Ser: 0.73 mg/dL — ABNORMAL LOW (ref 0.76–1.27)
Glucose: 92 mg/dL (ref 65–99)
Potassium: 4.5 mmol/L (ref 3.5–5.2)
Sodium: 137 mmol/L (ref 134–144)
eGFR: 99 mL/min/{1.73_m2} (ref >59)

## 2021-03-28 ENCOUNTER — Telehealth: Payer: Self-pay

## 2021-03-28 NOTE — Telephone Encounter (Signed)
Patient name and DOB has been verified Patient was informed of lab results. Patient had no questions.  

## 2021-03-28 NOTE — Telephone Encounter (Signed)
-----   Message from Charlott Rakes, MD sent at 03/27/2021  1:51 PM EDT ----- Please inform the patient that labs are normal. Thank you.

## 2021-06-15 DIAGNOSIS — Z20828 Contact with and (suspected) exposure to other viral communicable diseases: Secondary | ICD-10-CM | POA: Diagnosis not present

## 2021-07-08 DIAGNOSIS — Z20822 Contact with and (suspected) exposure to covid-19: Secondary | ICD-10-CM | POA: Diagnosis not present

## 2021-07-09 DIAGNOSIS — Z20822 Contact with and (suspected) exposure to covid-19: Secondary | ICD-10-CM | POA: Diagnosis not present

## 2021-07-24 ENCOUNTER — Other Ambulatory Visit: Payer: Self-pay

## 2021-07-24 ENCOUNTER — Other Ambulatory Visit: Payer: Self-pay | Admitting: Family Medicine

## 2021-07-24 MED ORDER — AMLODIPINE BESYLATE 5 MG PO TABS
5.0000 mg | ORAL_TABLET | Freq: Every day | ORAL | 2 refills | Status: DC
  Filled 2021-07-24 – 2021-09-12 (×2): qty 30, 30d supply, fill #0
  Filled 2021-11-06: qty 30, 30d supply, fill #1

## 2021-07-25 ENCOUNTER — Other Ambulatory Visit: Payer: Self-pay

## 2021-09-12 ENCOUNTER — Other Ambulatory Visit: Payer: Self-pay

## 2021-09-30 DIAGNOSIS — Z20822 Contact with and (suspected) exposure to covid-19: Secondary | ICD-10-CM | POA: Diagnosis not present

## 2021-11-06 ENCOUNTER — Other Ambulatory Visit: Payer: Self-pay

## 2021-11-17 DIAGNOSIS — Z20822 Contact with and (suspected) exposure to covid-19: Secondary | ICD-10-CM | POA: Diagnosis not present

## 2021-12-08 DIAGNOSIS — Z20822 Contact with and (suspected) exposure to covid-19: Secondary | ICD-10-CM | POA: Diagnosis not present

## 2021-12-17 ENCOUNTER — Other Ambulatory Visit: Payer: Self-pay

## 2021-12-17 ENCOUNTER — Ambulatory Visit: Payer: Medicare Other | Attending: Family Medicine | Admitting: Family Medicine

## 2021-12-17 ENCOUNTER — Encounter: Payer: Self-pay | Admitting: Family Medicine

## 2021-12-17 VITALS — BP 136/78 | HR 90 | Ht 66.0 in | Wt 120.0 lb

## 2021-12-17 DIAGNOSIS — F17200 Nicotine dependence, unspecified, uncomplicated: Secondary | ICD-10-CM | POA: Diagnosis not present

## 2021-12-17 DIAGNOSIS — K219 Gastro-esophageal reflux disease without esophagitis: Secondary | ICD-10-CM | POA: Diagnosis not present

## 2021-12-17 DIAGNOSIS — F17210 Nicotine dependence, cigarettes, uncomplicated: Secondary | ICD-10-CM

## 2021-12-17 DIAGNOSIS — C799 Secondary malignant neoplasm of unspecified site: Secondary | ICD-10-CM | POA: Insufficient documentation

## 2021-12-17 DIAGNOSIS — Z79899 Other long term (current) drug therapy: Secondary | ICD-10-CM | POA: Diagnosis not present

## 2021-12-17 DIAGNOSIS — C220 Liver cell carcinoma: Secondary | ICD-10-CM | POA: Insufficient documentation

## 2021-12-17 DIAGNOSIS — Z23 Encounter for immunization: Secondary | ICD-10-CM

## 2021-12-17 DIAGNOSIS — I10 Essential (primary) hypertension: Secondary | ICD-10-CM

## 2021-12-17 DIAGNOSIS — R066 Hiccough: Secondary | ICD-10-CM

## 2021-12-17 DIAGNOSIS — F1010 Alcohol abuse, uncomplicated: Secondary | ICD-10-CM | POA: Diagnosis not present

## 2021-12-17 MED ORDER — AMLODIPINE BESYLATE 5 MG PO TABS
5.0000 mg | ORAL_TABLET | Freq: Every day | ORAL | 1 refills | Status: DC
  Filled 2021-12-17: qty 90, 90d supply, fill #0

## 2021-12-17 MED ORDER — CHLORPROMAZINE HCL 10 MG PO TABS
10.0000 mg | ORAL_TABLET | Freq: Three times a day (TID) | ORAL | 0 refills | Status: DC | PRN
  Filled 2021-12-17: qty 90, 30d supply, fill #0

## 2021-12-17 MED ORDER — BUPROPION HCL ER (XL) 150 MG PO TB24
150.0000 mg | ORAL_TABLET | Freq: Every day | ORAL | 1 refills | Status: DC
  Filled 2021-12-17: qty 90, 90d supply, fill #0

## 2021-12-17 NOTE — Progress Notes (Signed)
? ?Subjective:  ?Patient ID: Calvin Gardner, male    DOB: 01-30-1951  Age: 71 y.o. MRN: [id number] ? ?CC: Hypertension ? ? ?HPI ?Calvin Gardner is a 71 y.o. year old male with a history of hypertension, GERD, tobacco  (1ppd since the age of 16) and diffuse alcohol abuse, diagnosed with hepatocellular carcinoma (stage IIIa cT3,cN0,cM0) in the fall of last year s/p IR transcatheter chemo embolization of the liver mass in 07/2018. ? ?Interval History: ?He smokes 1 ppd and was prescribed bupropion for smoking cessation but never picked it up from the pharmacy. ?States he continues to have hiccoughs, last episode was last week. He tried medications in the past which were ineffective including baclofen, PPI. ? ?Doing well on his antihypertensive and is requesting refill of his amlodipine today. ?He has no additional concerns today. ?Past Medical History:  ?Diagnosis Date  ? Alcoholism ([place])   ? Aortic atherosclerosis ([place])   ? Arthritis   ? Cancer [facility])   ? liver  ? Cataract   ? Hepatitis C   ? HLD (hyperlipidemia)   ? Hypertension   ? Liver mass   ? Pulmonary nodule   ? ? ?Past Surgical History:  ?Procedure Laterality Date  ? COLONOSCOPY WITH PROPOFOL N/A 01/30/2014  ? Procedure: COLONOSCOPY WITH PROPOFOL;  Surgeon: Garlan Fair, MD;  Location: [facility];  Service: Endoscopy;  Laterality: N/A;  ? ESOPHAGOGASTRODUODENOSCOPY (EGD) WITH PROPOFOL N/A 01/30/2014  ? Procedure: ESOPHAGOGASTRODUODENOSCOPY (EGD) WITH PROPOFOL;  Surgeon: Garlan Fair, MD;  Location: [facility];  Service: Endoscopy;  Laterality: N/A;  ? IR ANGIOGRAM SELECTIVE EACH ADDITIONAL VESSEL  07/19/2018  ? IR ANGIOGRAM SELECTIVE EACH ADDITIONAL VESSEL  07/19/2018  ? IR ANGIOGRAM SELECTIVE EACH ADDITIONAL VESSEL  07/19/2018  ? IR ANGIOGRAM SELECTIVE EACH ADDITIONAL VESSEL  07/19/2018  ? IR ANGIOGRAM SELECTIVE EACH ADDITIONAL VESSEL  07/19/2018  ? IR ANGIOGRAM VISCERAL SELECTIVE  07/19/2018  ? IR EMBO TUMOR ORGAN ISCHEMIA INFARCT INC GUIDE  ROADMAPPING  07/19/2018  ? IR RADIOLOGIST EVAL & MGMT  07/07/2018  ? IR RADIOLOGIST EVAL & MGMT  08/16/2018  ? IR RADIOLOGIST EVAL & MGMT  01/31/2019  ? IR RADIOLOGIST EVAL & MGMT  04/25/2019  ? IR RADIOLOGIST EVAL & MGMT  07/20/2019  ? IR RADIOLOGIST EVAL & MGMT  10/17/2019  ? IR RADIOLOGIST EVAL & MGMT  01/30/2020  ? IR RADIOLOGIST EVAL & MGMT  08/14/2020  ? IR US GUIDE VASC ACCESS LEFT  07/19/2018  ? left jaw surgery     ? MANDIBLE FRACTURE SURGERY    ? ? ?Family History  ?Problem Relation Age of Onset  ? Cancer Mother   ?     type unknown  ? Emphysema Brother   ? Colon cancer Neg Hx   ? Esophageal cancer Neg Hx   ? Pancreatic cancer Neg Hx   ? ? ?Social History  ? ?Socioeconomic History  ? Marital status: Single  ?  Spouse name: Not on file  ? Number of children: 1  ? Years of education: Not on file  ? Highest education level: Not on file  ?Occupational History  ? Occupation: works in Consulting civil engineer   ?Tobacco Use  ? Smoking status: Some Days  ?  Packs/day: 1.00  ?  Years: 50.00  ?  Pack years: 50.00  ?  Types: Cigarettes  ? Smokeless tobacco: Never  ? Tobacco comments:  ?  trying to quit, smoke every other day  ?Vaping Use  ? Vaping Use: Never  used  ?Substance and Sexual Activity  ? Alcohol use: Yes  ?  Alcohol/week: 0.0 standard drinks  ?  Comment: 2-3 40 oz beers per day, x50 years  ? Drug use: No  ?  Comment: remote IV drug use age 80   ? Sexual activity: Yes  ?  Partners: Female, Male  ?  Comment: patient declines  ?Other Topics Concern  ? Not on file  ?Social History Narrative  ? Not on file  ? ?Social Determinants of Health  ? ?Financial Resource Strain: Low Risk   ? Difficulty of Paying Living Expenses: Not hard at all  ?Food Insecurity: No Food Insecurity  ? Worried About Charity fundraiser in the Last Year: Never true  ? Ran Out of Food in the Last Year: Never true  ?Transportation Needs: No Transportation Needs  ? Lack of Transportation (Medical): No  ? Lack of Transportation (Non-Medical): No  ?Physical Activity:  Sufficiently Active  ? Days of Exercise per Week: 5 days  ? Minutes of Exercise per Session: 40 min  ?Stress: No Stress Concern Present  ? Feeling of Stress : Not at all  ?Social Connections: Socially Isolated  ? Frequency of Communication with Friends and Family: More than three times a week  ? Frequency of Social Gatherings with Friends and Family: Never  ? Attends Religious Services: Never  ? Active Member of Clubs or Organizations: No  ? Attends Archivist Meetings: Never  ? Marital Status: Divorced  ? ? ?No Known Allergies ? ?Outpatient Medications Prior to Visit  ?Medication Sig Dispense Refill  ? amLODipine (NORVASC) 5 MG tablet Take 1 tablet (5 mg total) by mouth daily. 30 tablet 2  ? aspirin EC 81 MG tablet Take 81 mg by mouth every morning. Reported on 12/19/2015 (Patient not taking: Reported on 03/26/2021)    ? baclofen (LIORESAL) 10 MG tablet Take 1 tablet (10 mg total) by mouth 3 (three) times daily as needed (for hiccups). (Patient not taking: Reported on 12/17/2021) 30 each 0  ? buPROPion (WELLBUTRIN XL) 150 MG 24 hr tablet Take 1 tablet (150 mg total) by mouth daily for smoking cessation (Patient not taking: Reported on 12/17/2021) 30 tablet 3  ? ?No facility-administered medications prior to visit.  ? ? ? ?ROS ?Review of Systems  ?Constitutional:  Negative for activity change and appetite change.  ?HENT:  Negative for sinus pressure and sore throat.   ?Respiratory:  Negative for chest tightness, shortness of breath and wheezing.   ?Cardiovascular:  Negative for chest pain and palpitations.  ?Gastrointestinal:  Negative for abdominal distention, abdominal pain and constipation.  ?Genitourinary: Negative.   ?Musculoskeletal: Negative.   ?Psychiatric/Behavioral:  Negative for behavioral problems and dysphoric mood.   ? ?Objective:  ?BP 136/78   Pulse 90   Ht '5\' 6"'$  (1.676 m)   Wt 120 lb (54.4 kg)   SpO2 100%   BMI 19.37 kg/m?  ? ? ?  12/17/2021  ?  1:44 PM 03/26/2021  ?  9:31 AM 02/26/2021  ?   1:28 PM  ?BP/Weight  ?Systolic BP 536 144 315  ?Diastolic BP 78 66 83  ?Wt. (Lbs) 120 113.4 117  ?BMI 19.37 kg/m2 18.3 kg/m2 18.88 kg/m2  ? ? ? ? ?Physical Exam ?Constitutional:   ?   Appearance: He is well-developed.  ?Cardiovascular:  ?   Rate and Rhythm: Normal rate.  ?   Heart sounds: Normal heart sounds. No murmur heard. ?Pulmonary:  ?   Effort:  Pulmonary effort is normal.  ?   Breath sounds: Normal breath sounds. No wheezing or rales.  ?Chest:  ?   Chest wall: No tenderness.  ?Abdominal:  ?   General: Bowel sounds are normal. There is no distension.  ?   Palpations: Abdomen is soft. There is no mass.  ?   Tenderness: There is no abdominal tenderness.  ?Musculoskeletal:     ?   General: Normal range of motion.  ?   Right lower leg: No edema.  ?   Left lower leg: No edema.  ?Neurological:  ?   Mental Status: He is alert and oriented to person, place, and time.  ?Psychiatric:     ?   Mood and Affect: Mood normal.  ? ? ? ?  Latest Ref Rng & Units 03/26/2021  ? 10:31 AM 08/12/2020  ?  7:59 AM 01/15/2020  ?  8:15 AM  ?CMP  ?Glucose 65 - 99 mg/dL 92   86   113    ?BUN 8 - 27 mg/dL '9   10   6    '$ ?Creatinine 0.76 - 1.27 mg/dL 0.73   0.86   0.70    ?Sodium 134 - 144 mmol/L 137   137   138    ?Potassium 3.5 - 5.2 mmol/L 4.5   4.6   5.4    ?Chloride 96 - 106 mmol/L 100   102   102    ?CO2 20 - 29 mmol/L '22   26   27    '$ ?Calcium 8.6 - 10.2 mg/dL 9.9   10.3   10.1    ?Total Protein 6.1 - 8.1 g/dL  7.6   7.9    ?Total Bilirubin 0.2 - 1.2 mg/dL  0.4   0.5    ?Alkaline Phos 38 - 126 U/L   58    ?AST 10 - 35 U/L  29   24    ?ALT 9 - 46 U/L  12   14    ? ? ?Lipid Panel  ?   ?Component Value Date/Time  ? CHOL 156 03/18/2017 0834  ? TRIG 52 03/18/2017 0834  ? HDL 72 03/18/2017 0834  ? CHOLHDL 2.2 03/18/2017 0834  ? CHOLHDL 2.2 01/05/2014 1620  ? VLDL 12 01/05/2014 1620  ? [place] 74 03/18/2017 0834  ? ? ?CBC ?   ?Component Value Date/Time  ? WBC 10.3 08/12/2020 0759  ? RBC 5.02 08/12/2020 0759  ? HGB 14.9 08/12/2020 0759  ? HGB 13.5  06/12/2019 1156  ? HGB 13.0 05/16/2018 1219  ? HCT 44.7 08/12/2020 0759  ? HCT 39.1 05/16/2018 1219  ? PLT 301 08/12/2020 0759  ? PLT 266 06/12/2019 1156  ? PLT 199 05/16/2018 1219  ? MCV 89.0 08/12/2020 0759

## 2021-12-17 NOTE — Addendum Note (Signed)
Addended by: Gomez Cleverly on: 12/17/2021 02:13 PM ? ? Modules accepted: Orders ? ?

## 2021-12-17 NOTE — Patient Instructions (Signed)
Hiccups  A hiccup is the result of a sudden irritation of a muscle that is used for breathing (diaphragm). The diaphragm is located under your lungs and above your stomach. When the diaphragm gets irritated, it may quickly tighten without your control (have a spasm). The spasm causes you to quickly suck in air, and that causes your vocal cords to close together quickly. These reactions cause the hiccup sound. Hiccups usually last only a short amount of time (less than 48 hours). In unusual cases, they can last for days or months and require you to see your health care provider. Common causes of hiccups include: Eating too fast or eating too much food. Drinking alcohol or bubbly (carbonated) drinks. Eating or drinking hot or spicy foods and drinks. Swallowing extra air when sucking on candy or a straw or when chewing on gum. Feeling nervous, stressed, or excited. Having certain conditions that irritate the diaphragm nerves. Having metabolic or nervous system disorders. Follow these instructions at home: To prevent hiccups or lessen discomfort from hiccups: Eat and chew your food slowly. Eat small meals, and avoid overeating. If you drink alcohol: Limit how much you have to: 0-1 drink a day for women who are not pregnant. 0-2 drinks a day for men. Know how much alcohol is in a drink. In the [place] one drink equals one 12 oz bottle of beer (355 mL), one 5 oz glass of wine (148 mL), or one 1 oz glass of hard liquor (44 mL). Limit your drinking of carbonated or fizzy drinks, such as soda. Avoid eating or drinking hot or spicy foods and drinks. General instructions Watch for any changes in your hiccups. Take over-the-counter and prescription medicines only as told by your health care provider. Contact a health care provider if: Your hiccups last for more than 48 hours. Your hiccups do not improve with treatment. You cannot sleep or eat because of your hiccups. You have unexpected weight loss  because of your hiccups. You have numbness, tingling, or weakness. Get help right away if: You have trouble breathing or swallowing. You have severe pain in your abdomen. These symptoms may represent a serious problem that is an emergency. Do not wait to see if the symptoms will go away. Get medical help right away. Call your local emergency services (911 in the [place] Do not drive yourself to the hospital. Summary A hiccup is the result of a sudden irritation of a muscle that is used for breathing (diaphragm). Hiccups can be caused by many things, including eating too fast. Call your health care provider if your hiccups last for more than 48 hours. This information is not intended to replace advice given to you by your health care provider. Make sure you discuss any questions you have with your health care provider. Document Revised: 03/22/2020 Document Reviewed: 03/22/2020 Elsevier Patient Education  2023 Elsevier Inc.  

## 2021-12-18 ENCOUNTER — Other Ambulatory Visit: Payer: Self-pay

## 2021-12-19 ENCOUNTER — Ambulatory Visit: Payer: Medicare Other | Attending: Family Medicine

## 2021-12-19 DIAGNOSIS — I10 Essential (primary) hypertension: Secondary | ICD-10-CM

## 2021-12-20 LAB — CMP14+EGFR
ALT: 25 IU/L (ref 0–44)
AST: 45 IU/L — ABNORMAL HIGH (ref 0–40)
Albumin/Globulin Ratio: 1.6 (ref 1.2–2.2)
Albumin: 4.5 g/dL (ref 3.8–4.8)
Alkaline Phosphatase: 56 IU/L (ref 44–121)
BUN/Creatinine Ratio: 7 — ABNORMAL LOW (ref 10–24)
BUN: 5 mg/dL — ABNORMAL LOW (ref 8–27)
Bilirubin Total: 0.4 mg/dL (ref 0.0–1.2)
CO2: 23 mmol/L (ref 20–29)
Calcium: 9.8 mg/dL (ref 8.6–10.2)
Chloride: 101 mmol/L (ref 96–106)
Creatinine, Ser: 0.76 mg/dL (ref 0.76–1.27)
Globulin, Total: 2.9 g/dL (ref 1.5–4.5)
Glucose: 107 mg/dL — ABNORMAL HIGH (ref 70–99)
Potassium: 4.3 mmol/L (ref 3.5–5.2)
Sodium: 136 mmol/L (ref 134–144)
Total Protein: 7.4 g/dL (ref 6.0–8.5)
eGFR: 97 mL/min/{1.73_m2} (ref >59)

## 2021-12-20 LAB — LP+NON-HDL CHOLESTEROL
Cholesterol, Total: 156 mg/dL (ref 100–199)
HDL: 100 mg/dL (ref >39)
LDL Chol Calc (NIH): 47 mg/dL (ref 0–99)
Total Non-HDL-Chol (LDL+VLDL): 56 mg/dL (ref 0–129)
Triglycerides: 35 mg/dL (ref 0–149)
VLDL Cholesterol Cal: 9 mg/dL (ref 5–40)

## 2022-01-12 ENCOUNTER — Inpatient Hospital Stay: Admission: RE | Admit: 2022-01-12 | Payer: Medicare Other | Source: Ambulatory Visit

## 2022-01-20 ENCOUNTER — Other Ambulatory Visit: Payer: Self-pay | Admitting: Interventional Radiology

## 2022-01-20 DIAGNOSIS — C220 Liver cell carcinoma: Secondary | ICD-10-CM

## 2022-01-23 ENCOUNTER — Ambulatory Visit
Admission: RE | Admit: 2022-01-23 | Discharge: 2022-01-23 | Disposition: A | Discharge status: Home / Self Care | Payer: Medicare Other | Source: Ambulatory Visit | Attending: Family Medicine | Admitting: Family Medicine

## 2022-01-23 DIAGNOSIS — F17210 Nicotine dependence, cigarettes, uncomplicated: Secondary | ICD-10-CM | POA: Diagnosis not present

## 2022-01-26 ENCOUNTER — Other Ambulatory Visit: Payer: Self-pay | Admitting: Family Medicine

## 2022-01-26 ENCOUNTER — Other Ambulatory Visit: Payer: Self-pay

## 2022-01-26 MED ORDER — ATORVASTATIN CALCIUM 20 MG PO TABS
20.0000 mg | ORAL_TABLET | Freq: Every day | ORAL | 3 refills | Status: DC
  Filled 2022-01-26: qty 30, 30d supply, fill #0

## 2022-02-02 ENCOUNTER — Other Ambulatory Visit: Payer: Self-pay

## 2022-02-06 ENCOUNTER — Ambulatory Visit (HOSPITAL_COMMUNITY)
Admission: RE | Admit: 2022-02-06 | Discharge: 2022-02-06 | Disposition: A | Discharge status: Home / Self Care | Payer: Medicare Other | Source: Ambulatory Visit | Attending: Interventional Radiology | Admitting: Interventional Radiology

## 2022-02-06 DIAGNOSIS — C220 Liver cell carcinoma: Secondary | ICD-10-CM | POA: Diagnosis not present

## 2022-02-06 DIAGNOSIS — K5730 Diverticulosis of large intestine without perforation or abscess without bleeding: Secondary | ICD-10-CM | POA: Diagnosis not present

## 2022-02-06 DIAGNOSIS — N281 Cyst of kidney, acquired: Secondary | ICD-10-CM | POA: Diagnosis not present

## 2022-02-06 MED ORDER — GADOBUTROL 1 MMOL/ML IV SOLN
5.0000 mL | Freq: Once | INTRAVENOUS | Status: AC | PRN
  Administered 2022-02-06: 5 mL via INTRAVENOUS

## 2022-02-10 ENCOUNTER — Ambulatory Visit
Admission: RE | Admit: 2022-02-10 | Discharge: 2022-02-10 | Disposition: A | Discharge status: Home / Self Care | Payer: Medicare Other | Source: Ambulatory Visit | Attending: Interventional Radiology | Admitting: Interventional Radiology

## 2022-02-10 DIAGNOSIS — C220 Liver cell carcinoma: Secondary | ICD-10-CM

## 2022-02-24 ENCOUNTER — Encounter: Payer: Self-pay | Admitting: *Deleted

## 2022-02-24 ENCOUNTER — Ambulatory Visit
Admission: RE | Admit: 2022-02-24 | Discharge: 2022-02-24 | Disposition: A | Discharge status: Home / Self Care | Payer: Medicare Other | Source: Ambulatory Visit | Attending: Interventional Radiology | Admitting: Interventional Radiology

## 2022-02-24 DIAGNOSIS — Z9889 Other specified postprocedural states: Secondary | ICD-10-CM | POA: Diagnosis not present

## 2022-02-24 DIAGNOSIS — C220 Liver cell carcinoma: Secondary | ICD-10-CM | POA: Diagnosis not present

## 2022-02-24 HISTORY — PX: IR RADIOLOGIST EVAL & MGMT: IMG5224

## 2022-02-24 NOTE — Progress Notes (Signed)
Chief Complaint: Patient was consulted remotely today (TeleHealth) for hepatocellular cancer at the request of Calvin Eskridge K.    Referring Physician(s): Dr. Stark Gardner  History of Present Illness: Calvin Gardner is a 71 y.o. male with a history of HCV and EtOH cirrhosis complicated by development of a large 7.4 cm partially exophytic mass arising from the inferior aspect of hepatic segment 6.  MR imaging demonstrates LI-RADS 5 imaging features diagnostic of hepatocellular carcinoma.     He was deemed not to be a surgical candidate and therefore underwent liver directed therapy with drug-eluting bead chemoembolization on 07/19/2018.     Surveillance MRI imaging was performed on 10/12/2019.  Excellent continued response to therapy with further involution of the treated segment 6 mass.  There has been a nearly 50% reduction in lesion volume.     MRI imaging dated 01/26/2020 demonstrates a further reduction in volume of the previously treated lesion.  No evidence of residual or recurrent disease.  A small 6 mm focus of subcapsular enhancement is present peripherally and may represent a benign vascular phenomenon or regenerating nodule.   MRI dated 07/31/20:  No significant change in right hepatic lobe mass status post chemoembolization. Nodular soft tissue enhancement along its medial wall remains stable, but is suspicious for residual carcinoma. Recommend continued follow-up by MRI in 6 months.  MRI 02/06/2021: No change in volume of RIGHT hepatic lobe hepatocellular carcinoma following chemoembolization.  Persistent enhancement along the medial border of the lesion concerning for residual albeit nonprogressive carcinoma.  MRI 02/06/2022: Decreased size of the embolization defect within segments 5/6.  Similar appearance of apparent nodular enhancement along the medial aspect of the embolization site. Cannot exclude stable residual disease. Alternatively, given ongoing size stability,  this apparent enhancement could relate to an adjacent diverticulum within the hepatic flexure of the colon.   Mr. Calvin Gardner continues to do very well.    Past Medical History:  Diagnosis Date   Alcoholism ([place])    Aortic atherosclerosis ([place])    Arthritis    Cancer ([place])    liver   Cataract    Hepatitis C    HLD (hyperlipidemia)    Hypertension    Liver mass    Pulmonary nodule     Past Surgical History:  Procedure Laterality Date   COLONOSCOPY WITH PROPOFOL N/A 01/30/2014   Procedure: COLONOSCOPY WITH PROPOFOL;  Surgeon: Calvin Fair, MD;  [facility];  Service: Endoscopy;  Laterality: N/A;   ESOPHAGOGASTRODUODENOSCOPY (EGD) WITH PROPOFOL N/A 01/30/2014   Procedure: ESOPHAGOGASTRODUODENOSCOPY (EGD) WITH PROPOFOL;  Surgeon: Calvin Fair, MD;  [facility];  Service: Endoscopy;  Laterality: N/A;   IR ANGIOGRAM SELECTIVE EACH ADDITIONAL VESSEL  07/19/2018   IR ANGIOGRAM SELECTIVE EACH ADDITIONAL VESSEL  07/19/2018   IR ANGIOGRAM SELECTIVE EACH ADDITIONAL VESSEL  07/19/2018   IR ANGIOGRAM SELECTIVE EACH ADDITIONAL VESSEL  07/19/2018   IR ANGIOGRAM SELECTIVE EACH ADDITIONAL VESSEL  07/19/2018   IR ANGIOGRAM VISCERAL SELECTIVE  07/19/2018   IR EMBO TUMOR ORGAN ISCHEMIA INFARCT INC GUIDE ROADMAPPING  07/19/2018   IR RADIOLOGIST EVAL & MGMT  07/07/2018   IR RADIOLOGIST EVAL & MGMT  08/16/2018   IR RADIOLOGIST EVAL & MGMT  01/31/2019   IR RADIOLOGIST EVAL & MGMT  04/25/2019   IR RADIOLOGIST EVAL & MGMT  07/20/2019   IR RADIOLOGIST EVAL & MGMT  10/17/2019   IR RADIOLOGIST EVAL & MGMT  01/30/2020   IR RADIOLOGIST EVAL & MGMT  08/14/2020  IR US GUIDE VASC ACCESS LEFT  07/19/2018   left jaw surgery      MANDIBLE FRACTURE SURGERY      Allergies: Patient has no known allergies.  Medications: Prior to Admission medications   Medication Sig Start Date End Date Taking? Authorizing Provider  amLODipine (NORVASC) 5 MG tablet Take 1 tablet (5 mg total) by mouth  daily. 12/17/21   Calvin Rakes, MD  aspirin EC 81 MG tablet Take 81 mg by mouth every morning. Reported on 12/19/2015 Patient not taking: Reported on 03/26/2021    [provider]  atorvastatin (LIPITOR) 20 MG tablet Take 1 tablet (20 mg total) by mouth daily. 01/26/22   Calvin Rakes, MD  baclofen (LIORESAL) 10 MG tablet Take 1 tablet (10 mg total) by mouth 3 (three) times daily as needed (for hiccups). Patient not taking: Reported on 12/17/2021 03/26/21   Calvin Rakes, MD  buPROPion (WELLBUTRIN XL) 150 MG 24 hr tablet Take 1 tablet (150 mg total) by mouth daily for smoking cessation 12/17/21   Calvin Rakes, MD  chlorproMAZINE (THORAZINE) 10 MG tablet Take 1 tablet (10 mg total) by mouth 3 (three) times daily as needed. For hiccups 12/17/21   Calvin Rakes, MD     Family History  Problem Relation Age of Onset   Cancer Mother        type unknown   Emphysema Brother    Colon cancer Neg Hx    Esophageal cancer Neg Hx    Pancreatic cancer Neg Hx     Social History   Socioeconomic History   Marital status: Single    Spouse name: Not on file   Number of children: 1   Years of education: Not on file   Highest education level: Not on file  Occupational History   Occupation: works in Consulting civil engineer   Tobacco Use   Smoking status: Some Days    Packs/day: 1.00    Years: 50.00    Total pack years: 50.00    Types: Cigarettes   Smokeless tobacco: Never   Tobacco comments:    trying to quit, smoke every other day  Vaping Use   Vaping Use: Never used  Substance and Sexual Activity   Alcohol use: Yes    Alcohol/week: 0.0 standard drinks of alcohol    Comment: 2-3 40 oz beers per day, x50 years   Drug use: No    Comment: remote IV drug use age 28    Sexual activity: Yes    Partners: Female, Male    Comment: patient declines  Other Topics Concern   Not on file  Social History Narrative   Not on file   Social Determinants of Health   Financial Resource Strain: Low Risk   (02/26/2021)   Overall Financial Resource Strain (CARDIA)    Difficulty of Paying Living Expenses: Not hard at all  Food Insecurity: No Food Insecurity (02/26/2021)   Hunger Vital Sign    Worried About Running Out of Food in the Last Year: Never true    [place] in the Last Year: Never true  Transportation Needs: No Transportation Needs (02/26/2021)   PRAPARE - Hydrologist (Medical): No    Lack of Transportation (Non-Medical): No  Physical Activity: Sufficiently Active (02/26/2021)   Exercise Vital Sign    Days of Exercise per Week: 5 days    Minutes of Exercise per Session: 40 min  Stress: No Stress Concern Present (02/26/2021)   [employer] - Occupational Stress Questionnaire    Feeling of Stress : Not at all  Social Connections: Socially Isolated (02/26/2021)   Social Connection and Isolation Panel [NHANES]    Frequency of Communication with Friends and Family: More than three times a week    Frequency of Social Gatherings with Friends and Family: Never    Attends Religious Services: Never    Marine scientist or Organizations: No    Attends Music therapist: Never    Marital Status: Divorced    ECOG Status: 0 - Asymptomatic  Review of Systems  Review of Systems: A 12 point ROS discussed and pertinent positives are indicated in the HPI above.  All other systems are negative.  Physical Exam No direct physical exam was performed (except for noted visual exam findings with Video Visits).    Vital Signs: There were no vitals taken for this visit.  Imaging: MR ABDOMEN WWO CONTRAST  Result Date: 02/06/2022 CLINICAL DATA:  Follow-up of hepatocellular carcinoma. Status post chemoembolization 07/19/2018. EXAM: MRI ABDOMEN WITHOUT AND WITH CONTRAST TECHNIQUE: Multiplanar multisequence MR imaging of the abdomen was performed both before and after the administration of intravenous contrast. CONTRAST:  78m  GADAVIST GADOBUTROL 1 MMOL/ML IV SOLN COMPARISON:  Prior MRI of 02/06/2021. Interventional radiology clinic note of 08/14/2020. FINDINGS: Portions of exam are mildly motion degraded. Lower chest: Normal heart size without pericardial or pleural effusion. Hepatobiliary: No definite evidence of cirrhosis. Advanced hepatic steatosis. The embolization defect centered about the medial aspect of segments 5/6 measures 2.5 x 2.3 cm on 55/24. Compare 2.9 x 3.2 cm when remeasured in a similar fashion on the prior exam. Demonstrates heterogeneous precontrast T1 signal. The area of apparent enhancement along the medial portion of the ablation site is again identified at approximately 11 x 8 mm on 55/25. Similar to 16 x 6 mm on the prior exam. Of note, this area is intimately associated with the hepatic flexure of the colon, and multiple regional diverticula are seen. No new liver lesion. The gallbladder is presumably surgically absent. No biliary duct dilatation. Pancreas:  Normal, without mass or ductal dilatation. Spleen:  Normal in size, without focal abnormality. Adrenals/Urinary Tract: Normal adrenal glands. Tiny upper pole right renal cyst. Normal left kidney. No hydronephrosis. Stomach/Bowel: Normal stomach and small bowel. Extensive colonic diverticulosis. Vascular/Lymphatic: Aortic atherosclerosis. Patent portal, hepatic, splenic veins. No retroperitoneal or retrocrural adenopathy. Other:  No ascites. Musculoskeletal: No acute osseous abnormality. IMPRESSION: 1. Decreased size of the embolization defect within segments 5/6. Similar appearance of apparent nodular enhancement along the medial aspect of the embolization site. Cannot exclude stable residual disease. Alternatively, given ongoing size stability, this apparent enhancement could relate to an adjacent diverticulum within the hepatic flexure of the colon. 2. No findings of metastatic disease or new hepatocellular carcinoma. 3.  Aortic Atherosclerosis  (ICD10-I70.0). 4. Hepatic steatosis 5. Mild motion degradation. Electronically Signed   By: KAbigail MiyamotoM.D.   On: 02/06/2022 16:50    Labs:  CBC: No results for input(s): "WBC", "HGB", "HCT", "PLT" in the last 8760 hours.  COAGS: No results for input(s): "INR", "APTT" in the last 8760 hours.  BMP: Recent Labs    03/26/21 1031 12/19/21 0850  NA 137 136  K 4.5 4.3  CL 100 101  CO2 22 23  GLUCOSE 92 107*  BUN 9 5*  CALCIUM 9.9 9.8  CREATININE 0.73* 0.76    LIVER FUNCTION TESTS: Recent Labs    12/19/21 0850  BILITOT 0.4  AST 45*  ALT 25  ALKPHOS 56  PROT 7.4  ALBUMIN 4.5    TUMOR MARKERS: No results for input(s): "AFPTM", "CEA", "CA199", "CHROMGRNA" in the last 8760 hours.  Assessment and Plan:  Continuing to do well now 3.5 years status post drug-eluting bead chemoembolization of segment 6 hepatocellular carcinoma.  There is some concern of some stable nodular enhancement along the medial aspect of the treatment zone which remains unchanged over 1 year.  This is either stable recurrence (unlikely) or artifact from the adjacent colon and its diverticula.  We will continue with Q6 mo surveillance.   1.)  Next MRI abdomen with gadolinium contrast and accompanying clinic visit in 6 months. (December 2023).   Electronically Signed: Criselda Peaches 02/24/2022, 1:56 PM   I spent a total of  15 Minutes in remote  clinical consultation, greater than 50% of which was counseling/coordinating care for hepatocellular cancer.    Visit type: Audio only (telephone). Audio (no video) only due to patient's lack of internet/smartphone capability. Alternative for in-person consultation at [facility], [address], [place], [place]. This visit type was conducted due to national recommendations for restrictions regarding the COVID-19 Pandemic (e.g. social distancing).  This format is felt to be most appropriate for this patient at this time.  All issues noted in this  document were discussed and addressed.

## 2022-03-19 ENCOUNTER — Telehealth: Payer: Self-pay

## 2022-03-19 NOTE — Telephone Encounter (Signed)
LVM for patient to return phone call. Needs to schedule medicare wellness exam.

## 2022-03-19 NOTE — Telephone Encounter (Signed)
PT returned call, Agent does not sch AWV, last AWV 7/27 unsure if wanted CPE, called office, no answer, pls fu with pt to sch. [phone]

## 2022-03-23 NOTE — Telephone Encounter (Signed)
Pt has been called and scheduled for AWV over the phone.

## 2022-03-31 ENCOUNTER — Ambulatory Visit: Payer: Medicare Other | Attending: Family Medicine

## 2022-03-31 DIAGNOSIS — Z0000 Encounter for general adult medical examination without abnormal findings: Secondary | ICD-10-CM

## 2022-03-31 NOTE — Patient Instructions (Signed)
Calvin Gardner , Thank you for taking time to come for your Medicare Wellness Visit. I appreciate your ongoing commitment to your health goals. Please review the following plan we discussed and let me know if I can assist you in the future.   These are the goals we discussed:  Goals      Blood Pressure < 140/90     Quit smoking / using tobacco        This is a list of the screening recommended for you and due dates:  Health Maintenance  Topic Date Due   Tetanus Vaccine  Never done   COVID-19 Vaccine (4 - Mixed Product risk series) 02/18/2021   Zoster (Shingles) Vaccine (2 of 2) 02/11/2022   Flu Shot  03/03/2022   Colon Cancer Screening  01/31/2024   Pneumonia Vaccine  Completed   Hepatitis C Screening: USPSTF Recommendation to screen - Ages 18-79 yo.  Completed   HPV Vaccine  Aged Out  Health Maintenance After Age 57 After age 35, you are at a higher risk for certain long-term diseases and infections as well as injuries from falls. Falls are a major cause of broken bones and head injuries in people who are older than age 76. Getting regular preventive care can help to keep you healthy and well. Preventive care includes getting regular testing and making lifestyle changes as recommended by your health care provider. Talk with your health care provider about: Which screenings and tests you should have. A screening is a test that checks for a disease when you have no symptoms. A diet and exercise plan that is right for you. What should I know about screenings and tests to prevent falls? Screening and testing are the best ways to find a health problem early. Early diagnosis and treatment give you the best chance of managing medical conditions that are common after age 15. Certain conditions and lifestyle choices may make you more likely to have a fall. Your health care provider may recommend: Regular vision checks. Poor vision and conditions such as cataracts can make you more likely to have a  fall. If you wear glasses, make sure to get your prescription updated if your vision changes. Medicine review. Work with your health care provider to regularly review all of the medicines you are taking, including over-the-counter medicines. Ask your health care provider about any side effects that may make you more likely to have a fall. Tell your health care provider if any medicines that you take make you feel dizzy or sleepy. Strength and balance checks. Your health care provider may recommend certain tests to check your strength and balance while standing, walking, or changing positions. Foot health exam. Foot pain and numbness, as well as not wearing proper footwear, can make you more likely to have a fall. Screenings, including: Osteoporosis screening. Osteoporosis is a condition that causes the bones to get weaker and break more easily. Blood pressure screening. Blood pressure changes and medicines to control blood pressure can make you feel dizzy. Depression screening. You may be more likely to have a fall if you have a fear of falling, feel depressed, or feel unable to do activities that you used to do. Alcohol use screening. Using too much alcohol can affect your balance and may make you more likely to have a fall. Follow these instructions at home: Lifestyle Do not drink alcohol if: Your health care provider tells you not to drink. If you drink alcohol: Limit how much you have to:  0-1 drink a day for women. 0-2 drinks a day for men. Know how much alcohol is in your drink. In the [place] one drink equals one 12 oz bottle of beer (355 mL), one 5 oz glass of wine (148 mL), or one 1 oz glass of hard liquor (44 mL). Do not use any products that contain nicotine or tobacco. These products include cigarettes, chewing tobacco, and vaping devices, such as e-cigarettes. If you need help quitting, ask your health care provider. Activity  Follow a regular exercise program to stay fit. This will  help you maintain your balance. Ask your health care provider what types of exercise are appropriate for you. If you need a cane or walker, use it as recommended by your health care provider. Wear supportive shoes that have nonskid soles. Safety  Remove any tripping hazards, such as rugs, cords, and clutter. Install safety equipment such as grab bars in bathrooms and safety rails on stairs. Keep rooms and walkways well-lit. General instructions Talk with your health care provider about your risks for falling. Tell your health care provider if: You fall. Be sure to tell your health care provider about all falls, even ones that seem minor. You feel dizzy, tiredness (fatigue), or off-balance. Take over-the-counter and prescription medicines only as told by your health care provider. These include supplements. Eat a healthy diet and maintain a healthy weight. A healthy diet includes low-fat dairy products, low-fat (lean) meats, and fiber from whole grains, beans, and lots of fruits and vegetables. Stay current with your vaccines. Schedule regular health, dental, and eye exams. Summary Having a healthy lifestyle and getting preventive care can help to protect your health and wellness after age 61. Screening and testing are the best way to find a health problem early and help you avoid having a fall. Early diagnosis and treatment give you the best chance for managing medical conditions that are more common for people who are older than age 61. Falls are a major cause of broken bones and head injuries in people who are older than age 60. Take precautions to prevent a fall at home. Work with your health care provider to learn what changes you can make to improve your health and wellness and to prevent falls. This information is not intended to replace advice given to you by your health care provider. Make sure you discuss any questions you have with your health care provider. Document Revised:  12/09/2020 Document Reviewed: 12/09/2020 Elsevier Patient Education  [employer].

## 2022-03-31 NOTE — Progress Notes (Signed)
Subjective:   LARRON ARMOR is a 71 y.o. male who presents for Medicare Annual/Subsequent preventive examination.  Review of Systems     I connected with Delfina Redwood on 03/31/2022 at 11:43 am by telephone and verified that I am speaking with the correct person using two identifiers. I discussed the limitations, risks, security and privacy concerns of performing an evaluation and management service by telephone and the availability of in person appointments. I also discussed with the patient that there may be a patient responsible charge related to this service. The patient expressed understanding and agreed to proceed.   Patient location: Home  My [place] on the telephone call: Myself and Patient      Cardiac Risk Factors include: none     Objective:    There were no vitals filed for this visit. There is no height or weight on file to calculate BMI.     03/31/2022   11:46 AM 02/26/2021    1:37 PM 07/19/2018    9:32 AM 05/16/2018   11:14 AM 03/12/2017    2:37 PM 02/28/2017    4:22 PM 10/29/2015   12:49 PM  Advanced Directives  Does Patient Have a Medical Advance Directive? No No No No No No No  Would patient like information on creating a medical advance directive? Yes (ED - Information included in AVS) No - Patient declined No - Patient declined No - Patient declined  No - Patient declined     Current Medications (verified) Outpatient Encounter Medications as of 03/31/2022  Medication Sig   amLODipine (NORVASC) 5 MG tablet Take 1 tablet (5 mg total) by mouth daily.   chlorproMAZINE (THORAZINE) 10 MG tablet Take 1 tablet (10 mg total) by mouth 3 (three) times daily as needed. For hiccups   aspirin EC 81 MG tablet Take 81 mg by mouth every morning. Reported on 12/19/2015 (Patient not taking: Reported on 03/26/2021)   atorvastatin (LIPITOR) 20 MG tablet Take 1 tablet (20 mg total) by mouth daily. (Patient not taking: Reported on 03/31/2022)    baclofen (LIORESAL) 10 MG tablet Take 1 tablet (10 mg total) by mouth 3 (three) times daily as needed (for hiccups). (Patient not taking: Reported on 12/17/2021)   buPROPion (WELLBUTRIN XL) 150 MG 24 hr tablet Take 1 tablet (150 mg total) by mouth daily for smoking cessation (Patient not taking: Reported on 03/31/2022)   No facility-administered encounter medications on file as of 03/31/2022.    Allergies (verified) Patient has no known allergies.   History: Past Medical History:  Diagnosis Date   Alcoholism ([place])    Aortic atherosclerosis ([place])    Arthritis    Cancer ([place])    liver   Cataract    Hepatitis C    HLD (hyperlipidemia)    Hypertension    Liver mass    Pulmonary nodule    Past Surgical History:  Procedure Laterality Date   COLONOSCOPY WITH PROPOFOL N/A 01/30/2014   Procedure: COLONOSCOPY WITH PROPOFOL;  Surgeon: Garlan Fair, MD;  Location: [facility];  Service: Endoscopy;  Laterality: N/A;   ESOPHAGOGASTRODUODENOSCOPY (EGD) WITH PROPOFOL N/A 01/30/2014   Procedure: ESOPHAGOGASTRODUODENOSCOPY (EGD) WITH PROPOFOL;  Surgeon: Garlan Fair, MD;  Location: [facility];  Service: Endoscopy;  Laterality: N/A;   IR ANGIOGRAM SELECTIVE EACH ADDITIONAL VESSEL  07/19/2018   IR ANGIOGRAM SELECTIVE EACH ADDITIONAL VESSEL  07/19/2018   IR ANGIOGRAM SELECTIVE EACH ADDITIONAL VESSEL  07/19/2018   IR [place]  ADDITIONAL VESSEL  07/19/2018   IR ANGIOGRAM SELECTIVE EACH ADDITIONAL VESSEL  07/19/2018   IR ANGIOGRAM VISCERAL SELECTIVE  07/19/2018   IR EMBO TUMOR ORGAN ISCHEMIA INFARCT INC GUIDE ROADMAPPING  07/19/2018   IR RADIOLOGIST EVAL & MGMT  07/07/2018   IR RADIOLOGIST EVAL & MGMT  08/16/2018   IR RADIOLOGIST EVAL & MGMT  01/31/2019   IR RADIOLOGIST EVAL & MGMT  04/25/2019   IR RADIOLOGIST EVAL & MGMT  07/20/2019   IR RADIOLOGIST EVAL & MGMT  10/17/2019   IR RADIOLOGIST EVAL & MGMT  01/30/2020   IR RADIOLOGIST EVAL & MGMT  08/14/2020   IR RADIOLOGIST EVAL &  MGMT  02/24/2022   IR US GUIDE VASC ACCESS LEFT  07/19/2018   left jaw surgery      MANDIBLE FRACTURE SURGERY     Family History  Problem Relation Age of Onset   Cancer Mother        type unknown   Emphysema Brother    Colon cancer Neg Hx    Esophageal cancer Neg Hx    Pancreatic cancer Neg Hx    Social History   Socioeconomic History   Marital status: Single    Spouse name: Not on file   Number of children: 1   Years of education: Not on file   Highest education level: Not on file  Occupational History   Occupation: works in Consulting civil engineer   Tobacco Use   Smoking status: Some Days    Packs/day: 1.00    Years: 50.00    Total pack years: 50.00    Types: Cigarettes   Smokeless tobacco: Never   Tobacco comments:    trying to quit, smoke every other day  Vaping Use   Vaping Use: Never used  Substance and Sexual Activity   Alcohol use: Yes    Alcohol/week: 0.0 standard drinks of alcohol    Comment: 2-3 40 oz beers per day, x50 years   Drug use: No    Comment: remote IV drug use age 28    Sexual activity: Yes    Partners: Female, Male    Comment: patient declines  Other Topics Concern   Not on file  Social History Narrative   Not on file   Social Determinants of Health   Financial Resource Strain: Low Risk  (03/31/2022)   Overall Financial Resource Strain (CARDIA)    Difficulty of Paying Living Expenses: Not very hard  Food Insecurity: No Food Insecurity (03/31/2022)   Hunger Vital Sign    Worried About Running Out of Food in the Last Year: Never true    Ran Out of Food in the Last Year: Never true  Transportation Needs: No Transportation Needs (03/31/2022)   PRAPARE - Hydrologist (Medical): No    Lack of Transportation (Non-Medical): No  Physical Activity: Sufficiently Active (03/31/2022)   Exercise Vital Sign    Days of Exercise per Week: 7 days    Minutes of Exercise per Session: 30 min  Stress: No Stress Concern Present (03/31/2022)    [place]    Feeling of Stress : Not at all  Social Connections: Socially Isolated (02/26/2021)   Social Connection and Isolation Panel [NHANES]    Frequency of Communication with Friends and Family: More than three times a week    Frequency of Social Gatherings with Friends and Family: Never    Attends Religious Services: Never    Active Member  of Clubs or Organizations: No    Attends Archivist Meetings: Never    Marital Status: Divorced    Tobacco Counseling Ready to quit: Not Answered Counseling given: Not Answered Tobacco comments: trying to quit, smoke every other day   Clinical Intake:  Pre-visit preparation completed: No  Pain : No/denies pain     Nutritional Risks: None Diabetes: No  How often do you need to have someone help you when you read instructions, pamphlets, or other written materials from your doctor or pharmacy?: 1 - Never  Diabetic?No  Interpreter Needed?: No      Activities of Daily Living    03/31/2022   11:47 AM  In your present state of health, do you have any difficulty performing the following activities:  Hearing? 0  Vision? 0  Difficulty concentrating or making decisions? 1  Walking or climbing stairs? 0  Dressing or bathing? 0  Doing errands, shopping? 0  Preparing Food and eating ? N  Using the Toilet? N  In the past six months, have you accidently leaked urine? N  Do you have problems with loss of bowel control? N  Managing your Medications? N  Managing your Finances? N  Housekeeping or managing your Housekeeping? N    Patient Care Team: Charlott Rakes, MD as PCP - General (Family Medicine) Criselda Peaches, MD as Consulting Physician (Interventional Radiology)  Indicate any recent Medical Services you may have received from other than [facility] providers in the past year (date may be approximate).     Assessment:   This is a routine wellness  examination for [place].  Hearing/Vision screen No results found.  Dietary issues and exercise activities discussed: Current Exercise Habits: Home exercise routine, Type of exercise: walking, Time (Minutes): 30, Frequency (Times/Week): 7, Weekly Exercise (Minutes/Week): 210, Intensity: Mild   Goals Addressed   None    Depression Screen    03/31/2022   11:46 AM 12/17/2021    1:45 PM 03/26/2021    9:33 AM 02/26/2021    1:31 PM 06/25/2020   11:26 AM 03/11/2020    2:00 PM 08/10/2019   10:17 AM  PHQ 2/9 Scores  PHQ - 2 Score 0 0 0 0 0 6 0  PHQ- 9 Score  0 0  0 27 0    Fall Risk    03/31/2022   11:46 AM 12/17/2021    1:45 PM 02/26/2021    1:37 PM 06/25/2020   11:26 AM 03/11/2020    1:59 PM  [place] in the past year? 0 0 0 0 0  Number falls in past yr: 0 0 0 0   Injury with Fall? 0 0 0 0   Risk for fall due to : No Fall Risks  No Fall Risks  No Fall Risks  Follow up Education provided        [place]:  Any stairs in or around the home? Yes  If so, are there any without handrails? No  Home free of loose throw rugs in walkways, pet beds, electrical cords, etc? Yes  Adequate lighting in your home to reduce risk of falls? Yes   ASSISTIVE DEVICES UTILIZED TO PREVENT FALLS:  Life alert? No  Use of a cane, walker or w/c? No  Grab bars in the bathroom? No  Shower chair or bench in shower? No  Elevated toilet seat or a handicapped toilet? No   TIMED UP AND GO:  Was the  test performed? No .  Length of time to ambulate 10 feet: 0  sec.   Gait slow and steady without use of assistive device  Cognitive Function:    03/31/2022   11:47 AM  MMSE - Mini Mental State Exam  Orientation to time 5  Orientation to Place 5  Registration 3  Attention/ Calculation 5  Recall 3  Language- name 2 objects 2  Language- repeat 1  Language- follow 3 step command 3  Language- read & follow direction 1  Write a sentence 1  Copy design 1  Total  score 30        03/31/2022   11:48 AM  6CIT Screen  What Year? 0 points  What month? 0 points  What time? 0 points  Count back from 20 4 points  Months in reverse 4 points  Repeat phrase 6 points  Total Score 14 points    Immunizations Immunization History  Administered Date(s) Administered   Hepatitis A, Adult 09/19/2014   Influenza,inj,Quad PF,6+ Mos 06/20/2015, 06/11/2017, 08/31/2018, 08/10/2019, 07/02/2020, 03/26/2021   Moderna Sars-Covid-2 Vaccination 12/19/2019, 01/16/2020   PFIZER(Purple Top)SARS-COV-2 Vaccination 12/24/2020   Pneumococcal Conjugate-13 08/31/2018   Pneumococcal Polysaccharide-23 07/06/2014, 03/26/2021   Zoster Recombinat (Shingrix) 12/17/2021    TDAP status: Due, Education has been provided regarding the importance of this vaccine. Advised may receive this vaccine at local pharmacy or Health Dept. Aware to provide a copy of the vaccination record if obtained from local pharmacy or Health Dept. Verbalized acceptance and understanding.  Flu Vaccine status: Due, Education has been provided regarding the importance of this vaccine. Advised may receive this vaccine at local pharmacy or Health Dept. Aware to provide a copy of the vaccination record if obtained from local pharmacy or Health Dept. Verbalized acceptance and understanding.  Pneumococcal vaccine status: Up to date  Covid-19 vaccine status: Completed vaccines  Qualifies for Shingles Vaccine? Yes   Zostavax completed No   Shingrix Completed?: No.    Education has been provided regarding the importance of this vaccine. Patient has been advised to call insurance company to determine out of pocket expense if they have not yet received this vaccine. Advised may also receive vaccine at local pharmacy or Health Dept. Verbalized acceptance and understanding.  Screening Tests Health Maintenance  Topic Date Due   TETANUS/TDAP  Never done   COVID-19 Vaccine (4 - Mixed Product risk series) 02/18/2021    Zoster Vaccines- Shingrix (2 of 2) 02/11/2022   INFLUENZA VACCINE  03/03/2022   COLONOSCOPY (Pts 45-60yr Insurance coverage will need to be confirmed)  01/31/2024   Pneumonia Vaccine 71 Years old  Completed   Hepatitis C Screening  Completed   HPV VACCINES  Aged Out    Health Maintenance  Health Maintenance Due  Topic Date Due   TETANUS/TDAP  Never done   COVID-19 Vaccine (4 - Mixed Product risk series) 02/18/2021   Zoster Vaccines- Shingrix (2 of 2) 02/11/2022   INFLUENZA VACCINE  03/03/2022    Colorectal cancer screening: Type of screening: Colonoscopy. Completed 10/30/2013. Repeat every 10 years  Lung Cancer Screening: (Low Dose CT Chest recommended if Age 69-80years, 30 pack-year currently smoking OR have quit w/in 15years.) does qualify.   Lung Cancer Screening Referral: DONE 01/23/2022  Additional Screening:  Hepatitis C Screening: does qualify; Completed 03/12/2017  Vision Screening: Recommended annual ophthalmology exams for early detection of glaucoma and other disorders of the eye. Is the patient up to date with their annual eye exam?  No  Who is the provider or what is the name of the office in which the patient attends annual eye exams? N/A If pt is not established with a provider, would they like to be referred to a provider to establish care? Yes .   Dental Screening: Recommended annual dental exams for proper oral hygiene  Community Resource Referral / Chronic Care Management: CRR required this visit?  No   CCM required this visit?  No      Plan:     I have personally reviewed and noted the following in the patient's chart:   Medical and social history Use of alcohol, tobacco or illicit drugs  Current medications and supplements including opioid prescriptions. Patient is not currently taking opioid prescriptions. Functional ability and status Nutritional status Physical activity Advanced directives List of other physicians Hospitalizations,  surgeries, and ER visits in previous 12 months Vitals Screenings to include cognitive, depression, and falls Referrals and appointments  In addition, I have reviewed and discussed with patient certain preventive protocols, quality metrics, and best practice recommendations. A written personalized care plan for preventive services as well as general preventive health recommendations were provided to patient.     Gomez Cleverly, [place]   03/31/2022   Nurse Notes: I spent 30 minutes on this telephone encounter  AVS mailed to patient

## 2022-05-11 ENCOUNTER — Other Ambulatory Visit: Payer: Self-pay | Admitting: Family Medicine

## 2022-05-11 DIAGNOSIS — I10 Essential (primary) hypertension: Secondary | ICD-10-CM

## 2022-05-11 NOTE — Telephone Encounter (Signed)
Medication Refill - Medication: Rx #: [id number]  amLODipine (NORVASC) 5 MG tablet / they would like 90 day supply refills for next time   Has the patient contacted their pharmacy? Yes.   (Agent: If no, request that the patient contact the pharmacy for the refill. If patient does not wish to contact the pharmacy document the reason why and proceed with request.) (Agent: If yes, when and what did the pharmacy advise?)  Preferred Pharmacy (with phone number or street name): [facility]  Has the patient been seen for an appointment in the last year OR does the patient have an upcoming appointment? Yes.    Agent: Please be advised that RX refills may take up to 3 business days. We ask that you follow-up with your pharmacy.

## 2022-05-12 MED ORDER — AMLODIPINE BESYLATE 5 MG PO TABS: 5.0000 mg | ORAL_TABLET | Freq: Every day | ORAL | 0 refills | Status: DC

## 2022-05-12 NOTE — Telephone Encounter (Signed)
Requested Prescriptions  Pending Prescriptions Disp Refills  . amLODipine (NORVASC) 5 MG tablet 90 tablet 0    Sig: Take 1 tablet (5 mg total) by mouth daily.     Cardiovascular: Calcium Channel Blockers 2 Passed - 05/11/2022 12:07 PM      Passed - Last BP in normal range    BP Readings from Last 1 Encounters:  12/17/21 136/78         Passed - Last Heart Rate in normal range    Pulse Readings from Last 1 Encounters:  12/17/21 90         Passed - Valid encounter within last 6 months    Recent Outpatient Visits          4 months ago Smoking greater than 20 pack years   [place], Charlane Ferretti, MD   1 year ago Essential hypertension   [place], [place], MD   1 year ago Current moderate episode of major depressive disorder without prior episode [facility])   [place], Charlane Ferretti, MD   2 years ago Current moderate episode of major depressive disorder without prior episode [facility])   [place], MD   2 years ago Essential hypertension   [place], RPH-CPP      Future Appointments            In 2 months Charlott Rakes, MD [place]

## 2022-05-25 IMAGING — MR MR ABDOMEN WO/W CM
18 series · 48 of 48 positions shown · IV contrast (with contrast)
Comparison: MRI 10/12/2019

CLINICAL DATA: Hepatocellular carcinoma follow-up. Liver directed
therapy with drug-eluting bead chemoembolization on 07/19/2018.

EXAM:
MRI ABDOMEN WITHOUT AND WITH CONTRAST
TECHNIQUE: Multiplanar multisequence MR imaging of the abdomen was performed
both before and after the administration of intravenous contrast.
CONTRAST:  6mL GADAVIST GADOBUTROL 1 MMOL/ML IV SOLN

[Series 2: haste_cor_mbh · coronal · 6.0mm · 1.56mm/px · 2 of 33 slices shown]
[im 1/33]
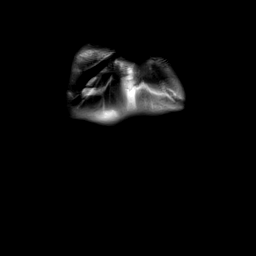
[im 33/33]
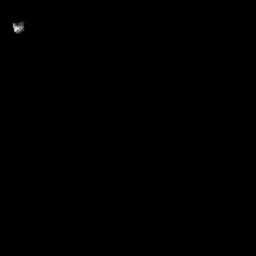

[Series 3: ax_trufi_mbh · axial · 6.0mm · 0.89mm/px · z∈[-97,+148]mm · 3 of 42 slices shown]
[im 1/42]
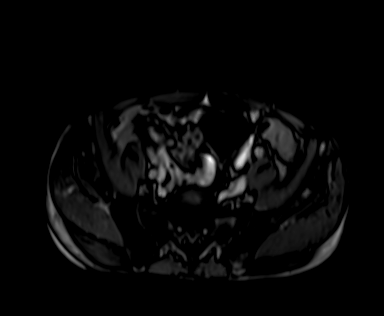
[im 21/42]
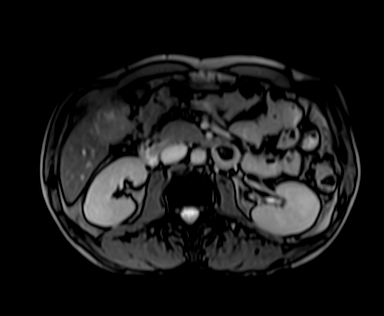
[im 42/42]
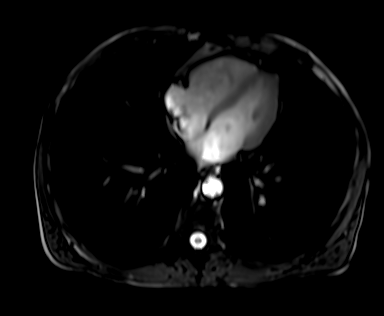

[Series 4: T2 fat-sat · axial · 6.0mm · 1.19mm/px · 1 of 36 slices shown]
[im 1/36]
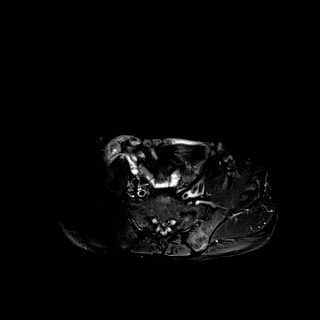

[Series 6: ax_diff_fb_tracew_dfc_mix · axial · 6.0mm · 1.36mm/px · z∈[-112,+183]mm · 3 of 84 slices shown]
[im 1/84]
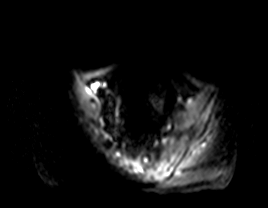
[im 42/84]
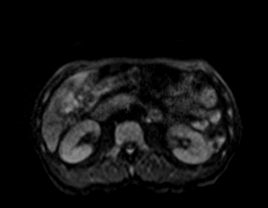
[im 84/84]
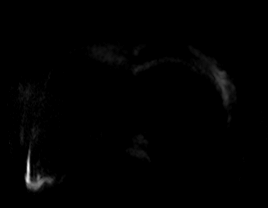

[Series 7: ax_diff_fb_adc_dfc_mix · axial · 6.0mm · 1.36mm/px · z∈[-112,+183]mm · 2 of 42 slices shown]
[im 1/42]
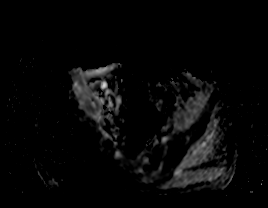
[im 42/42]
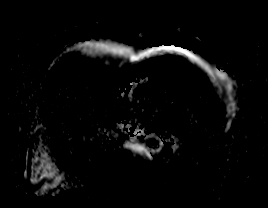

[Series 8: t1_vibe_e-dixon_tra_bh_pre_opp · axial · 3.0mm · 1.27mm/px · z∈[-80,+157]mm · 3 of 80 slices shown]
[im 1/80]
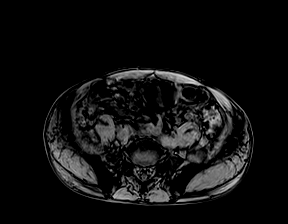
[im 40/80]
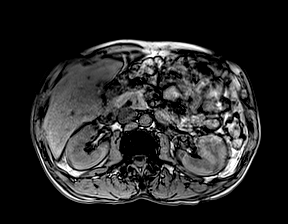
[im 80/80]
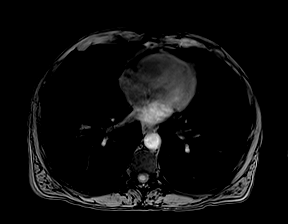

[Series 9: t1_vibe_e-dixon_tra_bh_pre_in · axial · 3.0mm · 1.27mm/px · z∈[-80,+157]mm · 3 of 80 slices shown]
[im 1/80]
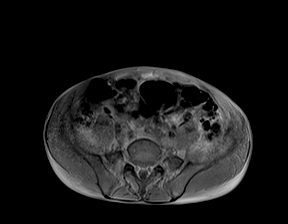
[im 40/80]
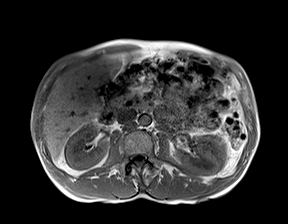
[im 80/80]
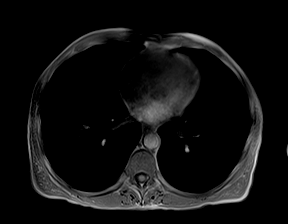

[Series 18: t1_vibe_e-dixon_tra_bh_pre_w_reg · axial · 3.0mm · 1.27mm/px · z∈[-80,+157]mm · 3 of 80 slices shown]
[im 1/80]
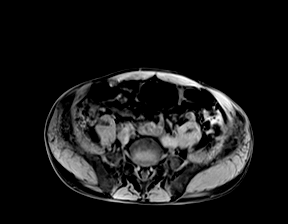
[im 40/80]
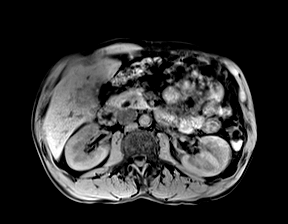
[im 80/80]
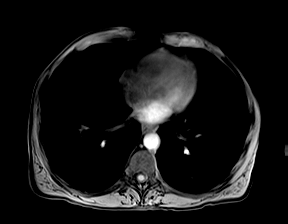

[Series 19: t1_vibe_dixon_tra_bh_arterial_w_reg · axial · 3.0mm · 1.27mm/px · z∈[-80,+157]mm · 3 of 80 slices shown]
[im 1/80]
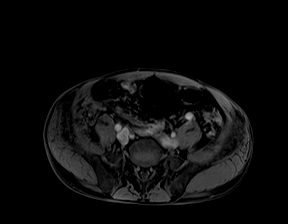
[im 40/80]
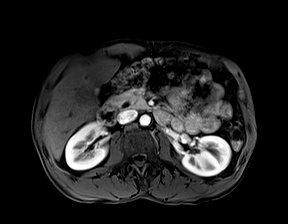
[im 80/80]
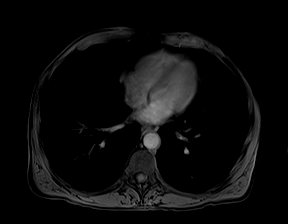

[Series 20: t1_vibe_dixon_tra_bh_arterial_w_sub · axial · 3.0mm · 1.27mm/px · z∈[-80,+157]mm · 3 of 80 slices shown]
[im 1/80]
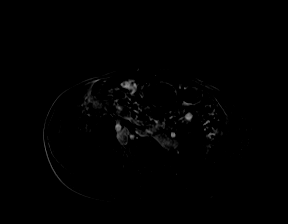
[im 40/80]
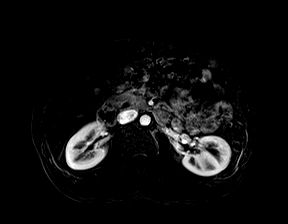
[im 80/80]
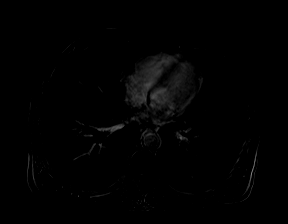

[Series 21: t1_vibe_dixon_tra_bh_venous_w_reg · axial · 3.0mm · 1.27mm/px · z∈[-80,+157]mm · 3 of 80 slices shown]
[im 1/80]
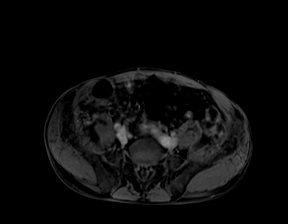
[im 40/80]
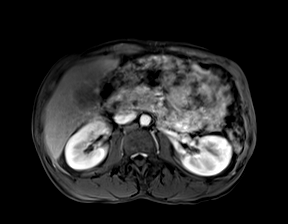
[im 80/80]
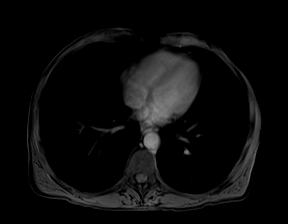

[Series 22: t1_vibe_dixon_tra_bh_venous_w_r_sub · axial · 3.0mm · 1.27mm/px · z∈[-80,+157]mm · 3 of 80 slices shown]
[im 1/80]
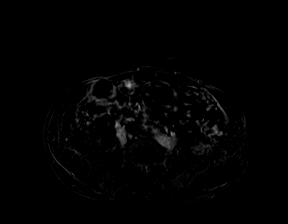
[im 40/80]
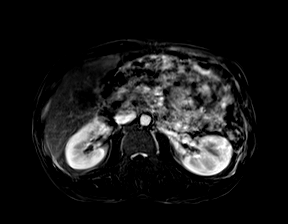
[im 80/80]
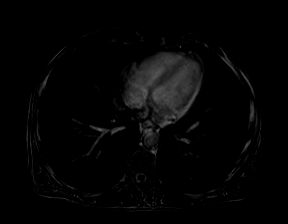

[Series 23: T2 · axial · 6.0mm · 1.56mm/px · 1 of 33 slices shown]
[im 1/33]
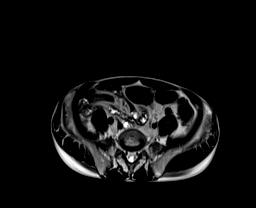

[Series 24: t1_vibe_dixon_tra_bh_delayed_w_reg · axial · 3.0mm · 1.27mm/px · z∈[-80,+157]mm · 3 of 80 slices shown]
[im 1/80]
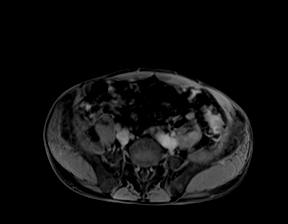
[im 40/80]
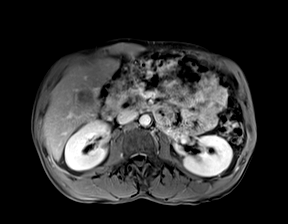
[im 80/80]
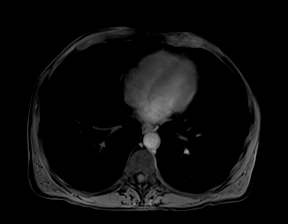

[Series 25: t1_vibe_dixon_tra_bh_delayed_w__sub · axial · 3.0mm · 1.27mm/px · z∈[-80,+157]mm · 3 of 80 slices shown]
[im 1/80]
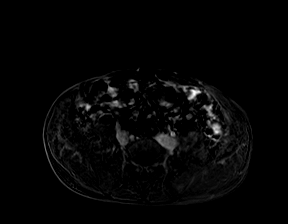
[im 40/80]
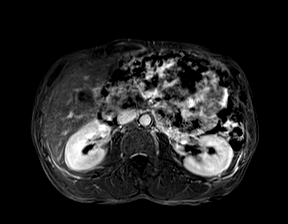
[im 80/80]
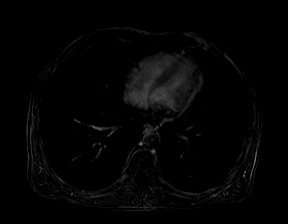

[Series 27: t1_vibe_dixon_cor_bh_post_w · coronal · 3.0mm · 1.56mm/px · 3 of 80 slices shown]
[im 1/80]
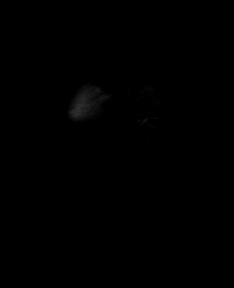
[im 40/80]
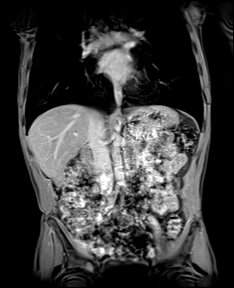
[im 80/80]
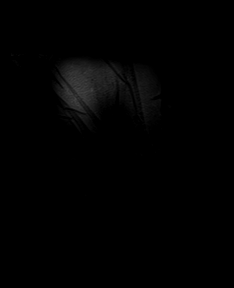

[Series 28: t1_vibe_dixon_tra_bh_3 min_w_reg · axial · 3.0mm · 1.27mm/px · z∈[-80,+157]mm · 3 of 80 slices shown]
[im 1/80]
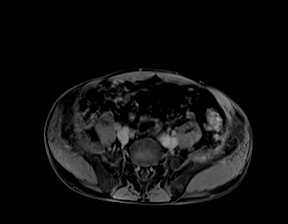
[im 40/80]
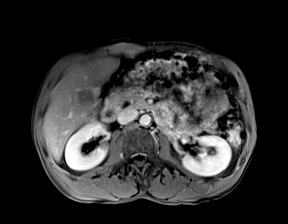
[im 80/80]
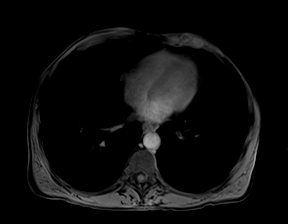

[Series 29: t1_vibe_dixon_tra_bh_3 min_w_re_sub · axial · 3.0mm · 1.27mm/px · z∈[-80,+157]mm · 3 of 80 slices shown]
[im 1/80]
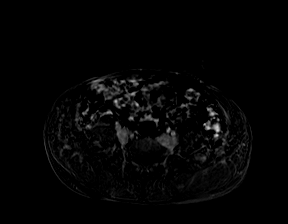
[im 40/80]
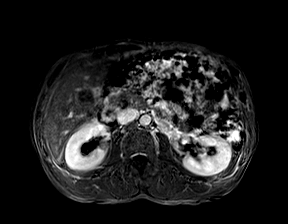
[im 80/80]
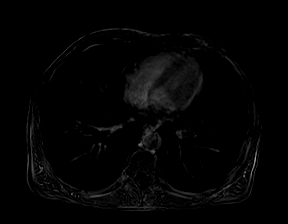

[48 of 48 positions shown; findings below may reference images not displayed]

FINDINGS: Lower chest:  Lung bases are clear.

Hepatobiliary: Along the medial border of the inferior RIGHT hepatic
lobe (is is segment 5) is again demonstrated rounded mass measuring
3.4 x 3.5 cm compared to 3.6 by 3.8 cm (remeasured) for continued
reduction in volume. The lesion is predominantly nonvascular however
along the medial wall of the rounded mass is there is rim of
subcapsular enhancement measuring 2.0 by 0.6 cm on image 43/19. This
peripheral enhancement is not significant changed from comparison
exam. On the more delayed sequences (series [DATE] there is
peripheral enhancement which may relate to fibrosis. Similar to
prior.

Small 6 mm enhancing focus in the peripheral RIGHT hepatic lobe
(image 29/19 likely represents benign vascular phenomena or
regenerating nodule. Lesion not identified on the other pulse
sequences

Pancreas: Normal pancreatic parenchymal intensity. No ductal
dilatation or inflammation.

Spleen: Normal spleen.

Adrenals/urinary tract: Adrenal glands and kidneys are normal.

Stomach/Bowel: Thickening of the gastric mucosa to 2 cm is favored
benign (image [DATE]). This may relate to cirrhosis.

Vascular/Lymphatic: Abdominal aortic normal caliber. No
retroperitoneal periportal lymphadenopathy.

Musculoskeletal: No aggressive osseous lesion
IMPRESSION: 1. Continued reduction in volume of RIGHT hepatic lobe mass
following chemoembolization 07/19/2018. There is persistent
enhancement along the medial border of the lesion. No change from
prior. Recommend attention on follow-up.
2. New small enhancing lesion in the RIGHT hepatic lobe is too small
to characterize and may represent vascular phenomena or dysplastic
nodule. Recommend attention on routine surveillance.
3. Thickened gastric mucosa is favored related to cirrhosis.

## 2022-06-16 ENCOUNTER — Other Ambulatory Visit: Payer: Self-pay | Admitting: Interventional Radiology

## 2022-06-16 DIAGNOSIS — C220 Liver cell carcinoma: Secondary | ICD-10-CM

## 2022-07-09 ENCOUNTER — Ambulatory Visit (HOSPITAL_COMMUNITY): Payer: Medicare HMO

## 2022-07-20 ENCOUNTER — Ambulatory Visit: Payer: Medicare Other | Admitting: Family Medicine

## 2022-07-21 ENCOUNTER — Ambulatory Visit
Admission: RE | Admit: 2022-07-21 | Discharge: 2022-07-21 | Disposition: A | Discharge status: Home / Self Care | Payer: Medicare Other | Source: Ambulatory Visit | Attending: Interventional Radiology | Admitting: Interventional Radiology

## 2022-07-21 DIAGNOSIS — C220 Liver cell carcinoma: Secondary | ICD-10-CM

## 2022-07-28 ENCOUNTER — Other Ambulatory Visit: Payer: Self-pay | Admitting: Family Medicine

## 2022-07-28 DIAGNOSIS — I10 Essential (primary) hypertension: Secondary | ICD-10-CM

## 2022-07-29 ENCOUNTER — Ambulatory Visit (HOSPITAL_COMMUNITY)
Admission: RE | Admit: 2022-07-29 | Discharge: 2022-07-29 | Disposition: A | Discharge status: Home / Self Care | Payer: Medicare HMO | Source: Ambulatory Visit | Attending: Interventional Radiology | Admitting: Interventional Radiology

## 2022-07-29 DIAGNOSIS — I700 Atherosclerosis of aorta: Secondary | ICD-10-CM | POA: Diagnosis not present

## 2022-07-29 DIAGNOSIS — K760 Fatty (change of) liver, not elsewhere classified: Secondary | ICD-10-CM | POA: Diagnosis not present

## 2022-07-29 DIAGNOSIS — C220 Liver cell carcinoma: Secondary | ICD-10-CM | POA: Insufficient documentation

## 2022-07-29 MED ORDER — GADOBUTROL 1 MMOL/ML IV SOLN
5.5000 mL | Freq: Once | INTRAVENOUS | Status: AC | PRN
  Administered 2022-07-29: 5.5 mL via INTRAVENOUS

## 2022-08-11 ENCOUNTER — Ambulatory Visit
Admission: RE | Admit: 2022-08-11 | Discharge: 2022-08-11 | Disposition: A | Discharge status: Home / Self Care | Payer: Medicare Other | Source: Ambulatory Visit | Attending: Interventional Radiology | Admitting: Interventional Radiology

## 2022-08-11 HISTORY — PX: IR RADIOLOGIST EVAL & MGMT: IMG5224

## 2022-08-11 NOTE — Progress Notes (Signed)
Chief Complaint: Patient was consulted remotely today (TeleHealth) for hepatocellular cancer at the request of Jessamyn Watterson K.    Referring Physician(s): Dr. Stark Klein  History of Present Illness: Calvin Gardner is a 72 y.o. male male with a history of HCV and EtOH cirrhosis complicated by development of a large 7.4 cm partially exophytic mass arising from the inferior aspect of hepatic segment 6.  MR imaging demonstrates LI-RADS 5 imaging features diagnostic of hepatocellular carcinoma.     He was deemed not to be a surgical candidate and therefore underwent liver directed therapy with drug-eluting bead chemoembolization on 07/19/2018.     Surveillance MRI imaging was performed on 10/12/2019.  Excellent continued response to therapy with further involution of the treated segment 6 mass.  There has been a nearly 50% reduction in lesion volume.     MRI imaging dated 01/26/2020 demonstrates a further reduction in volume of the previously treated lesion.  No evidence of residual or recurrent disease.  A small 6 mm focus of subcapsular enhancement is present peripherally and may represent a benign vascular phenomenon or regenerating nodule.   MRI dated 07/31/20:  No significant change in right hepatic lobe mass status post chemoembolization. Nodular soft tissue enhancement along its medial wall remains stable, but is suspicious for residual carcinoma. Recommend continued follow-up by MRI in 6 months.   MRI 02/06/2021: No change in volume of RIGHT hepatic lobe hepatocellular carcinoma following chemoembolization.  Persistent enhancement along the medial border of the lesion concerning for residual albeit nonprogressive carcinoma.   MRI 02/06/2022: Decreased size of the embolization defect within segments 5/6.  Similar appearance of apparent nodular enhancement along the medial aspect of the embolization site. Cannot exclude stable residual disease. Alternatively, given ongoing size  stability, this apparent enhancement could relate to an adjacent diverticulum within the hepatic flexure of the colon.   MRI 07/29/22: Expected evolution of remote ablation site centered about segments 5 and 6. The previously described enhancement along the medial portion of the ablation site is decreased, favored to be secondary to an adjacent colonic diverticulum. No new liver lesion and no evidence of abdominal metastatic disease.   Calvin Gardner continues to do very well. No active complaints.  Past Medical History:  Diagnosis Date   Alcoholism ([place])    Aortic atherosclerosis ([place])    Arthritis    Cancer ([place])    liver   Cataract    Hepatitis C    HLD (hyperlipidemia)    Hypertension    Liver mass    Pulmonary nodule     Past Surgical History:  Procedure Laterality Date   COLONOSCOPY WITH PROPOFOL N/A 01/30/2014   Procedure: COLONOSCOPY WITH PROPOFOL;  Surgeon: Garlan Fair, MD;  Location: [facility];  Service: Endoscopy;  Laterality: N/A;   ESOPHAGOGASTRODUODENOSCOPY (EGD) WITH PROPOFOL N/A 01/30/2014   Procedure: ESOPHAGOGASTRODUODENOSCOPY (EGD) WITH PROPOFOL;  Surgeon: Garlan Fair, MD;  Location: [facility];  Service: Endoscopy;  Laterality: N/A;   IR ANGIOGRAM SELECTIVE EACH ADDITIONAL VESSEL  07/19/2018   IR ANGIOGRAM SELECTIVE EACH ADDITIONAL VESSEL  07/19/2018   IR ANGIOGRAM SELECTIVE EACH ADDITIONAL VESSEL  07/19/2018   IR ANGIOGRAM SELECTIVE EACH ADDITIONAL VESSEL  07/19/2018   IR ANGIOGRAM SELECTIVE EACH ADDITIONAL VESSEL  07/19/2018   IR ANGIOGRAM VISCERAL SELECTIVE  07/19/2018   IR EMBO TUMOR ORGAN ISCHEMIA INFARCT INC GUIDE ROADMAPPING  07/19/2018   IR RADIOLOGIST EVAL & MGMT  07/07/2018   IR RADIOLOGIST EVAL & MGMT  08/16/2018  IR RADIOLOGIST EVAL & MGMT  01/31/2019   IR RADIOLOGIST EVAL & MGMT  04/25/2019   IR RADIOLOGIST EVAL & MGMT  07/20/2019   IR RADIOLOGIST EVAL & MGMT  10/17/2019   IR RADIOLOGIST EVAL & MGMT  01/30/2020   IR RADIOLOGIST EVAL &  MGMT  08/14/2020   IR RADIOLOGIST EVAL & MGMT  02/24/2022   IR RADIOLOGIST EVAL & MGMT  08/11/2022   IR US GUIDE VASC ACCESS LEFT  07/19/2018   left jaw surgery      MANDIBLE FRACTURE SURGERY      Allergies: Patient has no known allergies.  Medications: Prior to Admission medications   Medication Sig Start Date End Date Taking? Authorizing Provider  amLODipine (NORVASC) 5 MG tablet TAKE 1 TABLET EVERY DAY 07/29/22   Charlott Rakes, MD  aspirin EC 81 MG tablet Take 81 mg by mouth every morning. Reported on 12/19/2015 Patient not taking: Reported on 03/26/2021    [provider]  atorvastatin (LIPITOR) 20 MG tablet Take 1 tablet (20 mg total) by mouth daily. Patient not taking: Reported on 03/31/2022 01/26/22   Charlott Rakes, MD  baclofen (LIORESAL) 10 MG tablet Take 1 tablet (10 mg total) by mouth 3 (three) times daily as needed (for hiccups). Patient not taking: Reported on 12/17/2021 03/26/21   Charlott Rakes, MD  buPROPion (WELLBUTRIN XL) 150 MG 24 hr tablet Take 1 tablet (150 mg total) by mouth daily for smoking cessation Patient not taking: Reported on 03/31/2022 12/17/21   Charlott Rakes, MD  chlorproMAZINE (THORAZINE) 10 MG tablet Take 1 tablet (10 mg total) by mouth 3 (three) times daily as needed. For hiccups 12/17/21   Charlott Rakes, MD     Family History  Problem Relation Age of Onset   Cancer Mother        type unknown   Emphysema Brother    Colon cancer Neg Hx    Esophageal cancer Neg Hx    Pancreatic cancer Neg Hx     Social History   Socioeconomic History   Marital status: Single    Spouse name: Not on file   Number of children: 1   Years of education: Not on file   Highest education level: Not on file  Occupational History   Occupation: works in Consulting civil engineer   Tobacco Use   Smoking status: Some Days    Packs/day: 1.00    Years: 50.00    Total pack years: 50.00    Types: Cigarettes   Smokeless tobacco: Never   Tobacco comments:    trying to quit,  smoke every other day  Vaping Use   Vaping Use: Never used  Substance and Sexual Activity   Alcohol use: Yes    Alcohol/week: 0.0 standard drinks of alcohol    Comment: 2-3 40 oz beers per day, x50 years   Drug use: No    Comment: remote IV drug use age 39    Sexual activity: Yes    Partners: Female, Male    Comment: patient declines  Other Topics Concern   Not on file  Social History Narrative   Not on file   Social Determinants of Health   Financial Resource Strain: Low Risk  (03/31/2022)   Overall Financial Resource Strain (CARDIA)    Difficulty of Paying Living Expenses: Not very hard  Food Insecurity: No Food Insecurity (03/31/2022)   Hunger Vital Sign    Worried About Running Out of Food in the Last Year: Never true    Ran  Out of Food in the Last Year: Never true  Transportation Needs: No Transportation Needs (03/31/2022)   PRAPARE - Hydrologist (Medical): No    Lack of Transportation (Non-Medical): No  Physical Activity: Sufficiently Active (03/31/2022)   Exercise Vital Sign    Days of Exercise per Week: 7 days    Minutes of Exercise per Session: 30 min  Stress: No Stress Concern Present (03/31/2022)   [place]    Feeling of Stress : Not at all  Social Connections: Socially Isolated (02/26/2021)   Social Connection and Isolation Panel [NHANES]    Frequency of Communication with Friends and Family: More than three times a week    Frequency of Social Gatherings with Friends and Family: Never    Attends Religious Services: Never    Marine scientist or Organizations: No    Attends Music therapist: Never    Marital Status: Divorced    ECOG Status: 0 - Asymptomatic  Review of Systems  Review of Systems: A 12 point ROS discussed and pertinent positives are indicated in the HPI above.  All other systems are negative.  Physical Exam No direct physical  exam was performed (except for noted visual exam findings with Video Visits).    Vital Signs: There were no vitals taken for this visit.  Imaging: IR Radiologist Eval & Mgmt  Result Date: 08/11/2022 EXAM: ESTABLISHED PATIENT OFFICE VISIT CHIEF COMPLAINT: SEE EPIC NOTE HISTORY OF PRESENT ILLNESS: SEE EPIC NOTE REVIEW OF SYSTEMS: SEE EPIC NOTE PHYSICAL EXAMINATION: SEE EPIC NOTE ASSESSMENT AND PLAN: SEE EPIC NOTE Electronically Signed   By: Jacqulynn Cadet M.D.   On: 08/11/2022 08:47   MR ABDOMEN WWO CONTRAST  Result Date: 07/31/2022 CLINICAL DATA:  Status post chemoembolization of hepatocellular carcinoma on 07/19/2018. EXAM: MRI ABDOMEN WITHOUT AND WITH CONTRAST TECHNIQUE: Multiplanar multisequence MR imaging of the abdomen was performed both before and after the administration of intravenous contrast. CONTRAST:  5.30m GADAVIST GADOBUTROL 1 MMOL/ML IV SOLN COMPARISON:  02/06/2022 FINDINGS: Lower chest: Normal heart size without pericardial or pleural effusion. Hepatobiliary: No convincing evidence of cirrhosis. Resolution of hepatic steatosis. The ablation defect centered about the posteromedial aspect of segments 5/6 measures on the order of 2.5 x 2.0 cm on 42/15 versus 2.5 x 2.3 cm on the prior. The precontrast T1 hyperintensity in this region has resolved. The enhancement along the medial portion of the ablation site remains subtly apparent including at 6 mm on 40/15. No new liver lesion. Gallbladder not visualized and presumably surgically absent. No biliary duct dilatation. Pancreas:  Normal, without mass or ductal dilatation. Spleen:  Normal in size, without focal abnormality. Adrenals/Urinary Tract: Normal adrenal glands. A tiny 4 mm upper pole right renal nonenhancing lesion is likely a cyst . In the absence of clinically indicated signs/symptoms require(s) no independent follow-up. Normal left kidney. No hydronephrosis. Stomach/Bowel: Normal stomach and abdominal bowel loops.  Vascular/Lymphatic: Aortic atherosclerosis. Patent portal and splenic veins. No retroperitoneal or retrocrural adenopathy. Other:  No ascites. Musculoskeletal: No acute osseous abnormality. IMPRESSION: 1. Expected evolution of remote ablation site centered about segments 5 and 6. The previously described enhancement along the medial portion of the ablation site is decreased, favored to be secondary to an adjacent colonic diverticulum. No new liver lesion and no evidence of abdominal metastatic disease. 2.  Aortic Atherosclerosis (ICD10-I70.0). Electronically Signed   By: KAbigail MiyamotoM.D.   On: 07/31/2022 08:40  Labs:  CBC: No results for input(s): "WBC", "HGB", "HCT", "PLT" in the last 8760 hours.  COAGS: No results for input(s): "INR", "APTT" in the last 8760 hours.  BMP: Recent Labs    12/19/21 0850  NA 136  K 4.3  CL 101  CO2 23  GLUCOSE 107*  BUN 5*  CALCIUM 9.8  CREATININE 0.76    LIVER FUNCTION TESTS: Recent Labs    12/19/21 0850  BILITOT 0.4  AST 45*  ALT 25  ALKPHOS 56  PROT 7.4  ALBUMIN 4.5    TUMOR MARKERS: No results for input(s): "AFPTM", "CEA", "CA199", "CHROMGRNA" in the last 8760 hours.  Assessment and Plan:  Continuing to do well now 4 years status post drug-eluting bead chemoembolization of 6.7 cm segment 6 hepatocellular carcinoma.  The nodular enhancement along the medial aspect of the treatment zone is less conspicuous and is now favored to represent artifact from adjacent diverticulum.    1.)  Next MRI abdomen with gadolinium contrast and accompanying clinic visit in 6 months. (July 2023).     Electronically Signed: Criselda Peaches 08/11/2022, 8:58 AM   I spent a total of  15 Minutes in remote  clinical consultation, greater than 50% of which was counseling/coordinating care for hepatocellular cancer.    Visit type: Audio only (telephone). Audio (no video) only due to patient preference. Alternative for in-person consultation at  [facility], [address] [place], [place], [place]. This visit type was conducted due to national recommendations for restrictions regarding the COVID-19 Pandemic (e.g. social distancing).  This format is felt to be most appropriate for this patient at this time.  All issues noted in this document were discussed and addressed.

## 2022-09-10 DIAGNOSIS — Z961 Presence of intraocular lens: Secondary | ICD-10-CM | POA: Diagnosis not present

## 2022-09-10 DIAGNOSIS — H538 Other visual disturbances: Secondary | ICD-10-CM | POA: Diagnosis not present

## 2022-10-12 ENCOUNTER — Other Ambulatory Visit: Payer: Self-pay

## 2022-10-12 ENCOUNTER — Ambulatory Visit: Payer: Medicare Other | Attending: Family Medicine | Admitting: Family Medicine

## 2022-10-12 ENCOUNTER — Encounter: Payer: Self-pay | Admitting: Family Medicine

## 2022-10-12 VITALS — BP 147/70 | HR 95 | Temp 98.4°F | Ht 66.0 in | Wt 122.2 lb

## 2022-10-12 DIAGNOSIS — Z7982 Long term (current) use of aspirin: Secondary | ICD-10-CM | POA: Diagnosis not present

## 2022-10-12 DIAGNOSIS — E785 Hyperlipidemia, unspecified: Secondary | ICD-10-CM | POA: Diagnosis not present

## 2022-10-12 DIAGNOSIS — F17210 Nicotine dependence, cigarettes, uncomplicated: Secondary | ICD-10-CM | POA: Insufficient documentation

## 2022-10-12 DIAGNOSIS — Z79899 Other long term (current) drug therapy: Secondary | ICD-10-CM | POA: Diagnosis not present

## 2022-10-12 DIAGNOSIS — I10 Essential (primary) hypertension: Secondary | ICD-10-CM

## 2022-10-12 DIAGNOSIS — K219 Gastro-esophageal reflux disease without esophagitis: Secondary | ICD-10-CM | POA: Insufficient documentation

## 2022-10-12 DIAGNOSIS — R066 Hiccough: Secondary | ICD-10-CM | POA: Diagnosis not present

## 2022-10-12 DIAGNOSIS — B1920 Unspecified viral hepatitis C without hepatic coma: Secondary | ICD-10-CM | POA: Insufficient documentation

## 2022-10-12 MED ORDER — ATORVASTATIN CALCIUM 20 MG PO TABS
20.0000 mg | ORAL_TABLET | Freq: Every day | ORAL | 1 refills | Status: DC
  Filled 2022-10-12: qty 90, 90d supply, fill #0

## 2022-10-12 MED ORDER — AMLODIPINE BESYLATE 5 MG PO TABS
5.0000 mg | ORAL_TABLET | Freq: Every day | ORAL | 1 refills | Status: DC
  Filled 2022-10-12: qty 90, 90d supply, fill #0

## 2022-10-12 MED ORDER — BUPROPION HCL ER (XL) 150 MG PO TB24
150.0000 mg | ORAL_TABLET | Freq: Every day | ORAL | 1 refills | Status: DC
Start: 2022-10-12 — End: 2023-10-12
  Filled 2022-10-12: qty 90, 90d supply, fill #0

## 2022-10-12 NOTE — Progress Notes (Unsigned)
Discuss hiccups.

## 2022-10-12 NOTE — Patient Instructions (Signed)
Hiccups  A hiccup is the result of a sudden irritation of a muscle that is used for breathing (diaphragm). The diaphragm is located under your lungs and above your stomach. When the diaphragm gets irritated, it may quickly tighten without your control (have a spasm). The spasm causes you to quickly suck in air, and that causes your vocal cords to close together quickly. These reactions cause the hiccup sound. Hiccups usually last only a short amount of time (less than 48 hours). In unusual cases, they can last for days or months and require you to see your health care provider. Common causes of hiccups include: Eating too fast or eating too much food. Drinking alcohol or bubbly (carbonated) drinks. Eating or drinking hot or spicy foods and drinks. Swallowing extra air when sucking on candy or a straw or when chewing on gum. Feeling nervous, stressed, or excited. Having certain conditions that irritate the diaphragm nerves. Having metabolic or nervous system disorders. Follow these instructions at home: To prevent hiccups or lessen discomfort from hiccups: Eat and chew your food slowly. Eat small meals, and avoid overeating. If you drink alcohol: Limit how much you have to: 0-1 drink a day for women who are not pregnant. 0-2 drinks a day for men. Know how much alcohol is in a drink. In the [place] one drink equals one 12 oz bottle of beer (355 mL), one 5 oz glass of wine (148 mL), or one 1 oz glass of hard liquor (44 mL). Limit your drinking of carbonated or fizzy drinks, such as soda. Avoid eating or drinking hot or spicy foods and drinks. General instructions Watch for any changes in your hiccups. Take over-the-counter and prescription medicines only as told by your health care provider. Contact a health care provider if: Your hiccups last for more than 48 hours. Your hiccups do not improve with treatment. You cannot sleep or eat because of your hiccups. You have unexpected weight loss  because of your hiccups. You have numbness, tingling, or weakness. Get help right away if: You have trouble breathing or swallowing. You have severe pain in your abdomen. These symptoms may represent a serious problem that is an emergency. Do not wait to see if the symptoms will go away. Get medical help right away. Call your local emergency services (911 in the [place] Do not drive yourself to the hospital. Summary A hiccup is the result of a sudden irritation of a muscle that is used for breathing (diaphragm). Hiccups can be caused by many things, including eating too fast. Call your health care provider if your hiccups last for more than 48 hours. This information is not intended to replace advice given to you by your health care provider. Make sure you discuss any questions you have with your health care provider. Document Revised: 03/22/2020 Document Reviewed: 03/22/2020 Elsevier Patient Education  2023 Elsevier Inc.  

## 2022-10-12 NOTE — Progress Notes (Unsigned)
Subjective:  Patient ID: Calvin Gardner, male    DOB: 29-Jan-1951  Age: 72 y.o. MRN: [id number]  CC: Hypertension   HPI Calvin Gardner is a 72 y.o. year old male with a history of hypertension, GERD, tobacco  (1ppd since the age of 57) and diffuse alcohol abuse, diagnosed with hepatocellular carcinoma (stage IIIa cT3,cN0,cM0) in the fall of last year s/p IR transcatheter chemo embolization of the liver mass in 07/2018.   Interval History:  He is doing well on his antihypertensives with no adverse effects from his medications. Complains of hiccups for which I had prescribed Thorazine which he said was ineffective and PPI and muscle relaxants have been ineffective and it would sometimes last up to 4 days. He has had them since 2018 Past Medical History:  Diagnosis Date   Alcoholism ([place])    Aortic atherosclerosis ([place])    Arthritis    Cancer ([place])    liver   Cataract    Hepatitis C    HLD (hyperlipidemia)    Hypertension    Liver mass    Pulmonary nodule     Past Surgical History:  Procedure Laterality Date   COLONOSCOPY WITH PROPOFOL N/A 01/30/2014   Procedure: COLONOSCOPY WITH PROPOFOL;  Surgeon: Garlan Fair, MD;  Location: [facility];  Service: Endoscopy;  Laterality: N/A;   ESOPHAGOGASTRODUODENOSCOPY (EGD) WITH PROPOFOL N/A 01/30/2014   Procedure: ESOPHAGOGASTRODUODENOSCOPY (EGD) WITH PROPOFOL;  Surgeon: Garlan Fair, MD;  Location: [facility];  Service: Endoscopy;  Laterality: N/A;   IR ANGIOGRAM SELECTIVE EACH ADDITIONAL VESSEL  07/19/2018   IR ANGIOGRAM SELECTIVE EACH ADDITIONAL VESSEL  07/19/2018   IR ANGIOGRAM SELECTIVE EACH ADDITIONAL VESSEL  07/19/2018   IR ANGIOGRAM SELECTIVE EACH ADDITIONAL VESSEL  07/19/2018   IR ANGIOGRAM SELECTIVE EACH ADDITIONAL VESSEL  07/19/2018   IR ANGIOGRAM VISCERAL SELECTIVE  07/19/2018   IR EMBO TUMOR ORGAN ISCHEMIA INFARCT INC GUIDE ROADMAPPING  07/19/2018   IR RADIOLOGIST EVAL & MGMT  07/07/2018   IR RADIOLOGIST EVAL  & MGMT  08/16/2018   IR RADIOLOGIST EVAL & MGMT  01/31/2019   IR RADIOLOGIST EVAL & MGMT  04/25/2019   IR RADIOLOGIST EVAL & MGMT  07/20/2019   IR RADIOLOGIST EVAL & MGMT  10/17/2019   IR RADIOLOGIST EVAL & MGMT  01/30/2020   IR RADIOLOGIST EVAL & MGMT  08/14/2020   IR RADIOLOGIST EVAL & MGMT  02/24/2022   IR RADIOLOGIST EVAL & MGMT  08/11/2022   IR US GUIDE VASC ACCESS LEFT  07/19/2018   left jaw surgery      MANDIBLE FRACTURE SURGERY      Family History  Problem Relation Age of Onset   Cancer Mother        type unknown   Emphysema Brother    Colon cancer Neg Hx    Esophageal cancer Neg Hx    Pancreatic cancer Neg Hx     Social History   Socioeconomic History   Marital status: Single    Spouse name: Not on file   Number of children: 1   Years of education: Not on file   Highest education level: Not on file  Occupational History   Occupation: works in Consulting civil engineer   Tobacco Use   Smoking status: Some Days    Packs/day: 1.00    Years: 50.00    Total pack years: 50.00    Types: Cigarettes   Smokeless tobacco: Never   Tobacco comments:    trying to quit, smoke every  other day  Vaping Use   Vaping Use: Never used  Substance and Sexual Activity   Alcohol use: Yes    Alcohol/week: 0.0 standard drinks of alcohol    Comment: 2-3 40 oz beers per day, x50 years   Drug use: No    Comment: remote IV drug use age 63    Sexual activity: Yes    Partners: Female, Male    Comment: patient declines  Other Topics Concern   Not on file  Social History Narrative   Not on file   Social Determinants of Health   Financial Resource Strain: Low Risk  (03/31/2022)   Overall Financial Resource Strain (CARDIA)    Difficulty of Paying Living Expenses: Not very hard  Food Insecurity: No Food Insecurity (03/31/2022)   Hunger Vital Sign    Worried About Running Out of Food in the Last Year: Never true    Ran Out of Food in the Last Year: Never true  Transportation Needs: No Transportation Needs  (03/31/2022)   PRAPARE - Hydrologist (Medical): No    Lack of Transportation (Non-Medical): No  Physical Activity: Sufficiently Active (03/31/2022)   Exercise Vital Sign    Days of Exercise per Week: 7 days    Minutes of Exercise per Session: 30 min  Stress: No Stress Concern Present (03/31/2022)   [place]    Feeling of Stress : Not at all  Social Connections: Socially Isolated (02/26/2021)   Social Connection and Isolation Panel [NHANES]    Frequency of Communication with Friends and Family: More than three times a week    Frequency of Social Gatherings with Friends and Family: Never    Attends Religious Services: Never    Marine scientist or Organizations: No    Attends Music therapist: Never    Marital Status: Divorced    No Known Allergies  Outpatient Medications Prior to Visit  Medication Sig Dispense Refill   amLODipine (NORVASC) 5 MG tablet TAKE 1 TABLET EVERY DAY 90 tablet 0   aspirin EC 81 MG tablet Take 81 mg by mouth every morning. Reported on 12/19/2015 (Patient not taking: Reported on 03/26/2021)     atorvastatin (LIPITOR) 20 MG tablet Take 1 tablet (20 mg total) by mouth daily. (Patient not taking: Reported on 03/31/2022) 30 tablet 3   baclofen (LIORESAL) 10 MG tablet Take 1 tablet (10 mg total) by mouth 3 (three) times daily as needed (for hiccups). (Patient not taking: Reported on 12/17/2021) 30 each 0   buPROPion (WELLBUTRIN XL) 150 MG 24 hr tablet Take 1 tablet (150 mg total) by mouth daily for smoking cessation (Patient not taking: Reported on 03/31/2022) 90 tablet 1   chlorproMAZINE (THORAZINE) 10 MG tablet Take 1 tablet (10 mg total) by mouth 3 (three) times daily as needed. For hiccups (Patient not taking: Reported on 10/12/2022) 90 tablet 0   No facility-administered medications prior to visit.     ROS Review of Systems *** Objective:  BP (!)  157/81   Pulse 95   Temp 98.4 F (36.9 C) (Oral)   Ht '5\' 6"'$  (1.676 m)   Wt 122 lb 3.2 oz (55.4 kg)   SpO2 99%   BMI 19.72 kg/m      10/12/2022    3:53 PM 12/17/2021    1:44 PM 03/26/2021    9:31 AM  BP/Weight  Systolic BP [id number]  Diastolic BP 81  78 66  Wt. (Lbs) 122.2 120 113.4  BMI 19.72 kg/m2 19.37 kg/m2 18.3 kg/m2      Physical Exam ***    Latest Ref Rng & Units 12/19/2021    8:50 AM 03/26/2021   10:31 AM 08/12/2020    7:59 AM  CMP  Glucose 70 - 99 mg/dL 107  92  86   BUN 8 - 27 mg/dL '5  9  10   '$ Creatinine 0.76 - 1.27 mg/dL 0.76  0.73  0.86   Sodium 134 - 144 mmol/L 136  137  137   Potassium 3.5 - 5.2 mmol/L 4.3  4.5  4.6   Chloride 96 - 106 mmol/L 101  100  102   CO2 20 - 29 mmol/L '23  22  26   '$ Calcium 8.6 - 10.2 mg/dL 9.8  9.9  10.3   Total Protein 6.0 - 8.5 g/dL 7.4   7.6   Total Bilirubin 0.0 - 1.2 mg/dL 0.4   0.4   Alkaline Phos 44 - 121 IU/L 56     AST 0 - 40 IU/L 45   29   ALT 0 - 44 IU/L 25   12     Lipid Panel     Component Value Date/Time   CHOL 156 12/19/2021 0850   TRIG 35 12/19/2021 0850   HDL 100 12/19/2021 0850   CHOLHDL 2.2 03/18/2017 0834   CHOLHDL 2.2 01/05/2014 1620   VLDL 12 01/05/2014 1620   LDLCALC 47 12/19/2021 0850    CBC    Component Value Date/Time   WBC 10.3 08/12/2020 0759   RBC 5.02 08/12/2020 0759   HGB 14.9 08/12/2020 0759   HGB 13.5 06/12/2019 1156   HGB 13.0 05/16/2018 1219   HCT 44.7 08/12/2020 0759   HCT 39.1 05/16/2018 1219   PLT 301 08/12/2020 0759   PLT 266 06/12/2019 1156   PLT 199 05/16/2018 1219   MCV 89.0 08/12/2020 0759   MCV 91 05/16/2018 1219   MCH 29.7 08/12/2020 0759   MCHC 33.3 08/12/2020 0759   RDW 11.6 08/12/2020 0759   RDW 12.1 (L) 05/16/2018 1219   LYMPHSABS 2.3 06/12/2019 1156   LYMPHSABS 1.0 05/16/2018 1219   MONOABS 1.0 06/12/2019 1156   EOSABS 0.1 06/12/2019 1156   EOSABS 0.0 05/16/2018 1219   BASOSABS 0.0 06/12/2019 1156   BASOSABS 0.1 05/16/2018 1219    Lab Results   Component Value Date   HGBA1C 5.0 10/07/2017    Assessment & Plan:  There are no diagnoses linked to this encounter.  Health Care Maintenance: *** No orders of the defined types were placed in this encounter.   Follow-up: No follow-ups on file.       Charlott Rakes, MD, FAAFP. [facility] and [place], [place]   10/12/2022, 3:56 PM

## 2022-10-13 ENCOUNTER — Encounter: Payer: Self-pay | Admitting: Gastroenterology

## 2022-10-13 LAB — CMP14+EGFR
ALT: 11 IU/L (ref 0–44)
AST: 29 IU/L (ref 0–40)
Albumin/Globulin Ratio: 1.6 (ref 1.2–2.2)
Albumin: 4.4 g/dL (ref 3.9–4.9)
Alkaline Phosphatase: 54 IU/L (ref 44–121)
BUN/Creatinine Ratio: 10 (ref 10–24)
BUN: 8 mg/dL (ref 8–27)
Bilirubin Total: <0.2 mg/dL (ref 0.0–1.2)
CO2: 22 mmol/L (ref 20–29)
Calcium: 9.9 mg/dL (ref 8.6–10.2)
Chloride: 103 mmol/L (ref 96–106)
Creatinine, Ser: 0.82 mg/dL (ref 0.76–1.27)
Globulin, Total: 2.8 g/dL (ref 1.5–4.5)
Glucose: 88 mg/dL (ref 70–99)
Potassium: 4.4 mmol/L (ref 3.5–5.2)
Sodium: 139 mmol/L (ref 134–144)
Total Protein: 7.2 g/dL (ref 6.0–8.5)
eGFR: 95 mL/min/{1.73_m2} (ref >59)

## 2022-10-14 NOTE — Progress Notes (Addendum)
[facility] Consult Note:  History: Calvin Gardner 10/20/2022  Referring provider: Charlott Rakes, MD  Reason for consult/chief complaint: No chief complaint on file.   Subjective  [id number] was last seen by Dr. Charlane Ferretti on 10/12/22. Per that note, he has a history of hypertension, GERD, tobacco  and alcohol abuse, diagnosed with hepatocellular carcinoma (stage IIIa cT3,cN0,cM0) in 2023. IR transcatheter chemo embolization of the liver mass in 07/2018. Pt complains of hiccups that started in 2018, they would sometimes last up to 4 days. Pt was prescribed Thorazine, PPI, and muscle relaxants with no improvements.       Patient presents to clinic today for an evaluation of hiccups  per the request of Dr. Charlott Rakes.     ROS: Review of Systems   Past Medical History: Past Medical History:  Diagnosis Date   Alcoholism ([place])    Aortic atherosclerosis ([place])    Arthritis    Cancer ([place])    liver   Cataract    Hepatitis C    HLD (hyperlipidemia)    Hypertension    Liver mass    Pulmonary nodule      Past Surgical History: Past Surgical History:  Procedure Laterality Date   COLONOSCOPY WITH PROPOFOL N/A 01/30/2014   Procedure: COLONOSCOPY WITH PROPOFOL;  Surgeon: Garlan Fair, MD;  Location: [facility];  Service: Endoscopy;  Laterality: N/A;   ESOPHAGOGASTRODUODENOSCOPY (EGD) WITH PROPOFOL N/A 01/30/2014   Procedure: ESOPHAGOGASTRODUODENOSCOPY (EGD) WITH PROPOFOL;  Surgeon: Garlan Fair, MD;  Location: [facility];  Service: Endoscopy;  Laterality: N/A;   IR ANGIOGRAM SELECTIVE EACH ADDITIONAL VESSEL  07/19/2018   IR ANGIOGRAM SELECTIVE EACH ADDITIONAL VESSEL  07/19/2018   IR ANGIOGRAM SELECTIVE EACH ADDITIONAL VESSEL  07/19/2018   IR ANGIOGRAM SELECTIVE EACH ADDITIONAL VESSEL  07/19/2018   IR ANGIOGRAM SELECTIVE EACH ADDITIONAL VESSEL  07/19/2018   IR ANGIOGRAM VISCERAL SELECTIVE  07/19/2018   IR EMBO TUMOR ORGAN ISCHEMIA INFARCT INC  GUIDE ROADMAPPING  07/19/2018   IR RADIOLOGIST EVAL & MGMT  07/07/2018   IR RADIOLOGIST EVAL & MGMT  08/16/2018   IR RADIOLOGIST EVAL & MGMT  01/31/2019   IR RADIOLOGIST EVAL & MGMT  04/25/2019   IR RADIOLOGIST EVAL & MGMT  07/20/2019   IR RADIOLOGIST EVAL & MGMT  10/17/2019   IR RADIOLOGIST EVAL & MGMT  01/30/2020   IR RADIOLOGIST EVAL & MGMT  08/14/2020   IR RADIOLOGIST EVAL & MGMT  02/24/2022   IR RADIOLOGIST EVAL & MGMT  08/11/2022   IR US GUIDE VASC ACCESS LEFT  07/19/2018   left jaw surgery      MANDIBLE FRACTURE SURGERY       Family History: Family History  Problem Relation Age of Onset   Cancer Mother        type unknown   Emphysema Brother    Colon cancer Neg Hx    Esophageal cancer Neg Hx    Pancreatic cancer Neg Hx     Social History: Social History   Socioeconomic History   Marital status: Single    Spouse name: Not on file   Number of children: 1   Years of education: Not on file   Highest education level: Not on file  Occupational History   Occupation: works in Consulting civil engineer   Tobacco Use   Smoking status: Some Days    Packs/day: 1.00    Years: 50.00    Total pack years: 50.00    Types: Cigarettes   Smokeless  tobacco: Never   Tobacco comments:    trying to quit, smoke every other day  Vaping Use   Vaping Use: Never used  Substance and Sexual Activity   Alcohol use: Yes    Alcohol/week: 0.0 standard drinks of alcohol    Comment: 2-3 40 oz beers per day, x50 years   Drug use: No    Comment: remote IV drug use age 34    Sexual activity: Yes    Partners: Female, Male    Comment: patient declines  Other Topics Concern   Not on file  Social History Narrative   Not on file   Social Determinants of Health   Financial Resource Strain: Low Risk  (03/31/2022)   Overall Financial Resource Strain (CARDIA)    Difficulty of Paying Living Expenses: Not very hard  Food Insecurity: No Food Insecurity (03/31/2022)   Hunger Vital Sign    Worried About Running Out of  Food in the Last Year: Never true    Ran Out of Food in the Last Year: Never true  Transportation Needs: No Transportation Needs (03/31/2022)   PRAPARE - Hydrologist (Medical): No    Lack of Transportation (Non-Medical): No  Physical Activity: Sufficiently Active (03/31/2022)   Exercise Vital Sign    Days of Exercise per Week: 7 days    Minutes of Exercise per Session: 30 min  Stress: No Stress Concern Present (03/31/2022)   [place]    Feeling of Stress : Not at all  Social Connections: Socially Isolated (02/26/2021)   Social Connection and Isolation Panel [NHANES]    Frequency of Communication with Friends and Family: More than three times a week    Frequency of Social Gatherings with Friends and Family: Never    Attends Religious Services: Never    Marine scientist or Organizations: No    Attends Music therapist: Never    Marital Status: Divorced    Allergies: No Known Allergies  Outpatient Meds: Current Outpatient Medications  Medication Sig Dispense Refill   amLODipine (NORVASC) 5 MG tablet Take 1 tablet (5 mg total) by mouth daily. 90 tablet 1   aspirin EC 81 MG tablet Take 81 mg by mouth every morning. Reported on 12/19/2015 (Patient not taking: Reported on 03/26/2021)     atorvastatin (LIPITOR) 20 MG tablet Take 1 tablet (20 mg total) by mouth daily. 90 tablet 1   baclofen (LIORESAL) 10 MG tablet Take 1 tablet (10 mg total) by mouth 3 (three) times daily as needed (for hiccups). (Patient not taking: Reported on 12/17/2021) 30 each 0   buPROPion (WELLBUTRIN XL) 150 MG 24 hr tablet Take 1 tablet (150 mg total) by mouth daily for smoking cessation 90 tablet 1   chlorproMAZINE (THORAZINE) 10 MG tablet Take 1 tablet (10 mg total) by mouth 3 (three) times daily as needed. For hiccups (Patient not taking: Reported on 10/12/2022) 90 tablet 0   No current  facility-administered medications for this visit.      ___________________________________________________________________ Objective   Exam:  There were no vitals taken for this visit. Wt Readings from Last 3 Encounters:  10/12/22 122 lb 3.2 oz (55.4 kg)  12/17/21 120 lb (54.4 kg)  03/26/21 113 lb 6.4 oz (51.4 kg)    General: well-appearing ***  Eyes: sclera anicteric, no redness ENT: oral mucosa moist without lesions, no cervical or supraclavicular lymphadenopathy CV: ***, no JVD, no peripheral edema Resp:  clear to auscultation bilaterally, normal RR and effort noted GI: soft, *** tenderness, with active bowel sounds. No guarding or palpable organomegaly noted. Skin; warm and dry, no rash or jaundice noted Neuro: awake, alert and oriented x 3. Normal gross motor function and fluent speech  Labs:    Latest Ref Rng & Units 08/12/2020    7:59 AM 01/15/2020    8:15 AM 07/12/2019    8:37 AM  CBC  WBC 3.8 - 10.8 Thousand/uL 10.3  9.3  8.5   Hemoglobin 13.2 - 17.1 g/dL 14.9  15.4  14.6   Hematocrit 38.5 - 50.0 % 44.7  48.2  45.2   Platelets 140 - 400 Thousand/uL 301  276  282       Latest Ref Rng & Units 10/12/2022    4:37 PM 12/19/2021    8:50 AM 03/26/2021   10:31 AM  CMP  Glucose 70 - 99 mg/dL 88  107  92   BUN 8 - 27 mg/dL 8  5  9    Creatinine 0.76 - 1.27 mg/dL 0.82  0.76  0.73   Sodium 134 - 144 mmol/L 139  136  137   Potassium 3.5 - 5.2 mmol/L 4.4  4.3  4.5   Chloride 96 - 106 mmol/L 103  101  100   CO2 20 - 29 mmol/L 22  23  22    Calcium 8.6 - 10.2 mg/dL 9.9  9.8  9.9   Total Protein 6.0 - 8.5 g/dL 7.2  7.4    Total Bilirubin 0.0 - 1.2 mg/dL <0.2  0.4    Alkaline Phos 44 - 121 IU/L 54  56    AST 0 - 40 IU/L 29  45    ALT 0 - 44 IU/L 11  25       Radiologic Studies:  GI Hx: EGD 06/13/18  Findings: - The esophagus was normal. There are no esophageal varices.  - The stomach was normal. There is no portal hypertensive gastropathy. There are no gastric varices.   - The examined duodenum was normal. Complications: No immediate complications. Estimated Blood Loss: Estimated blood loss: none.  Impressions- Normal esophagus. - Normal stomach. - Normal examined duodenum. -No esophageal or gastric varices. No portal hypertensive gastropathy. - No specimens collected. Impression:  Assessment: No diagnosis found.  ***   Plan: ***   Thank you for the courtesy of this consult.  Please call me with any questions or concerns.  Rutherford Limerick  CC: Referring provider noted above   I,Safa M Kadhim,acting as a scribe for Nelida Meuse III, MD.,have documented all relevant documentation on the behalf of Doran Stabler, MD,as directed by  Doran Stabler, MD while in the presence of Doran Stabler, MD.

## 2022-10-16 ENCOUNTER — Telehealth: Payer: Self-pay | Admitting: Emergency Medicine

## 2022-10-16 NOTE — Telephone Encounter (Signed)
Pt has been informed of lab results. 

## 2022-10-16 NOTE — Telephone Encounter (Signed)
Copied from [place] [id number]. Topic: General - Other >> Oct 15, 2022  4:55 PM Ja-Kwan M wrote: Reason for CRM: Pt stated he was returning call from a missed call he had. Cb# [phone]

## 2022-10-20 ENCOUNTER — Encounter: Payer: Self-pay | Admitting: Gastroenterology

## 2022-10-20 ENCOUNTER — Ambulatory Visit (INDEPENDENT_AMBULATORY_CARE_PROVIDER_SITE_OTHER): Payer: Medicare Other | Admitting: Gastroenterology

## 2022-10-20 VITALS — BP 122/78 | HR 88 | Ht 66.0 in | Wt 119.2 lb

## 2022-10-20 DIAGNOSIS — Z8505 Personal history of malignant neoplasm of liver: Secondary | ICD-10-CM

## 2022-10-20 DIAGNOSIS — R066 Hiccough: Secondary | ICD-10-CM | POA: Diagnosis not present

## 2022-10-20 NOTE — Patient Instructions (Signed)
_______________________________________________________  If your blood pressure at your visit was 140/90 or greater, please contact your primary care physician to follow up on this.  _______________________________________________________  If you are age 70 or older, your body mass index should be between 23-30. Your Body mass index is 19.25 kg/m. If this is out of the aforementioned range listed, please consider follow up with your Primary Care Provider.  If you are age 37 or younger, your body mass index should be between 19-25. Your Body mass index is 19.25 kg/m. If this is out of the aformentioned range listed, please consider follow up with your Primary Care Provider.   ________________________________________________________  The [facility] providers would like to encourage you to use [facility] to communicate with providers for non-urgent requests or questions.  Due to long hold times on the telephone, sending your provider a message by [facility] may be a faster and more efficient way to get a response.  Please allow 48 business hours for a response.  Please remember that this is for non-urgent requests.  _______________________________________________________  Follow up as needed.   It was a pleasure to see you today!  Thank you for trusting me with your gastrointestinal care!

## 2022-10-21 ENCOUNTER — Ambulatory Visit: Payer: Medicare Other | Admitting: Physician Assistant

## 2023-03-01 ENCOUNTER — Other Ambulatory Visit: Payer: Self-pay | Admitting: Interventional Radiology

## 2023-03-01 DIAGNOSIS — C220 Liver cell carcinoma: Secondary | ICD-10-CM

## 2023-03-09 ENCOUNTER — Ambulatory Visit (HOSPITAL_COMMUNITY)
Admission: RE | Admit: 2023-03-09 | Discharge: 2023-03-09 | Disposition: A | Discharge status: Home / Self Care | Payer: Medicare Other | Source: Ambulatory Visit | Attending: Interventional Radiology | Admitting: Interventional Radiology

## 2023-03-09 DIAGNOSIS — I700 Atherosclerosis of aorta: Secondary | ICD-10-CM | POA: Diagnosis not present

## 2023-03-09 DIAGNOSIS — C220 Liver cell carcinoma: Secondary | ICD-10-CM | POA: Diagnosis not present

## 2023-03-09 DIAGNOSIS — K760 Fatty (change of) liver, not elsewhere classified: Secondary | ICD-10-CM | POA: Diagnosis not present

## 2023-03-09 MED ORDER — GADOBUTROL 1 MMOL/ML IV SOLN
5.5000 mL | Freq: Once | INTRAVENOUS | Status: AC | PRN
  Administered 2023-03-09: 5.5 mL via INTRAVENOUS

## 2023-04-14 ENCOUNTER — Ambulatory Visit: Payer: Medicare Other | Attending: Family Medicine | Admitting: Family Medicine

## 2023-04-14 ENCOUNTER — Other Ambulatory Visit: Payer: Self-pay

## 2023-04-14 VITALS — BP 129/77 | HR 83 | Ht 66.0 in | Wt 111.0 lb

## 2023-04-14 DIAGNOSIS — Z23 Encounter for immunization: Secondary | ICD-10-CM | POA: Insufficient documentation

## 2023-04-14 DIAGNOSIS — C220 Liver cell carcinoma: Secondary | ICD-10-CM | POA: Diagnosis not present

## 2023-04-14 DIAGNOSIS — F17210 Nicotine dependence, cigarettes, uncomplicated: Secondary | ICD-10-CM | POA: Diagnosis not present

## 2023-04-14 DIAGNOSIS — R1115 Cyclical vomiting syndrome unrelated to migraine: Secondary | ICD-10-CM | POA: Diagnosis not present

## 2023-04-14 DIAGNOSIS — Z7182 Exercise counseling: Secondary | ICD-10-CM | POA: Insufficient documentation

## 2023-04-14 DIAGNOSIS — Z713 Dietary counseling and surveillance: Secondary | ICD-10-CM | POA: Diagnosis not present

## 2023-04-14 DIAGNOSIS — R066 Hiccough: Secondary | ICD-10-CM | POA: Diagnosis not present

## 2023-04-14 DIAGNOSIS — E785 Hyperlipidemia, unspecified: Secondary | ICD-10-CM | POA: Insufficient documentation

## 2023-04-14 DIAGNOSIS — R112 Nausea with vomiting, unspecified: Secondary | ICD-10-CM | POA: Diagnosis not present

## 2023-04-14 DIAGNOSIS — K219 Gastro-esophageal reflux disease without esophagitis: Secondary | ICD-10-CM | POA: Insufficient documentation

## 2023-04-14 DIAGNOSIS — H6092 Unspecified otitis externa, left ear: Secondary | ICD-10-CM | POA: Diagnosis not present

## 2023-04-14 DIAGNOSIS — Z79899 Other long term (current) drug therapy: Secondary | ICD-10-CM | POA: Insufficient documentation

## 2023-04-14 DIAGNOSIS — I10 Essential (primary) hypertension: Secondary | ICD-10-CM | POA: Diagnosis not present

## 2023-04-14 DIAGNOSIS — H60592 Other noninfective acute otitis externa, left ear: Secondary | ICD-10-CM

## 2023-04-14 MED ORDER — ATORVASTATIN CALCIUM 20 MG PO TABS
20.0000 mg | ORAL_TABLET | Freq: Every day | ORAL | 1 refills | Status: DC
  Filled 2023-04-14: qty 90, 90d supply, fill #0

## 2023-04-14 MED ORDER — ONDANSETRON HCL 4 MG PO TABS
4.0000 mg | ORAL_TABLET | Freq: Three times a day (TID) | ORAL | 1 refills | Status: DC | PRN
  Filled 2023-04-14: qty 20, 7d supply, fill #0

## 2023-04-14 MED ORDER — CIPROFLOXACIN-DEXAMETHASONE 0.3-0.1 % OT SUSP
4.0000 [drp] | Freq: Two times a day (BID) | OTIC | 0 refills | Status: DC
  Filled 2023-04-14: qty 7.5, 19d supply, fill #0

## 2023-04-14 MED ORDER — AMLODIPINE BESYLATE 5 MG PO TABS
5.0000 mg | ORAL_TABLET | Freq: Every day | ORAL | 1 refills | Status: DC
  Filled 2023-04-14: qty 90, 90d supply, fill #0

## 2023-04-14 NOTE — Progress Notes (Signed)
Subjective:  Patient ID: Calvin Gardner, male    DOB: 1951-02-21  Age: 72 y.o. MRN: [id number]  CC: Medical Management of Chronic Issues (Pt concern with swollen left ear.)   HPI Calvin Gardner is a 72 y.o. year old male with a history of hypertension, GERD, tobacco  (1ppd since the age of 17) and diffuse alcohol abuse, hepatocellular carcinoma (stage IIIa cT3,cN0,cM0) s/p IR transcatheter chemo embolization of the liver mass in 07/2018.   Interval History: Discussed the use of AI scribe software for clinical note transcription with the patient, who gave verbal consent to proceed.  He presents with a two-week history of left ear swelling and pain, which has become so severe that he is unable to touch the area.  He has no fever, no hearing loss. In addition to the ear pain, the patient reports a history of hypertension and hyperlipidemia, for which he is out of refills.  The patient also reports persistent hiccups and also complains of nausea and vomiting after eating.  He did see GI but no additional recommendation was made.  In the past I have placed him on muscle relaxant, for formal same with no improvement in symptoms.      For his hepatocellular carcinoma he is followed by interventional radiology Recently had MRI of the abdomen last month which revealed:  IMPRESSION: 1. Unchanged nonenhancing post treated lesion of the inferior right lobe of the liver, hepatic segment V/VI measuring 2.2 x 1.9 cm. LI-RADS TR nonviable. 2. Unchanged linear enhancement at the medial aspect of the ablation site and gallbladder fossa, of uncertain significance although unchanged over multiple prior examinations and presumed benign. 3. No new liver lesions. 4. Hepatic steatosis.   Aortic Atherosclerosis (ICD10-I70.0). Past Medical History:  Diagnosis Date   Alcoholism (HCC)    Aortic atherosclerosis (HCC)    Arthritis    Cancer (HCC)    liver   Cataract    Hepatitis C    HLD  (hyperlipidemia)    Hypertension    Liver mass    Pulmonary nodule     Past Surgical History:  Procedure Laterality Date   COLONOSCOPY WITH PROPOFOL N/A 01/30/2014   Procedure: COLONOSCOPY WITH PROPOFOL;  Surgeon: Charolett Bumpers, MD;  Location: [facility];  Service: Endoscopy;  Laterality: N/A;   ESOPHAGOGASTRODUODENOSCOPY (EGD) WITH PROPOFOL N/A 01/30/2014   Procedure: ESOPHAGOGASTRODUODENOSCOPY (EGD) WITH PROPOFOL;  Surgeon: Charolett Bumpers, MD;  Location: [facility];  Service: Endoscopy;  Laterality: N/A;   IR ANGIOGRAM SELECTIVE EACH ADDITIONAL VESSEL  07/19/2018   IR ANGIOGRAM SELECTIVE EACH ADDITIONAL VESSEL  07/19/2018   IR ANGIOGRAM SELECTIVE EACH ADDITIONAL VESSEL  07/19/2018   IR ANGIOGRAM SELECTIVE EACH ADDITIONAL VESSEL  07/19/2018   IR ANGIOGRAM SELECTIVE EACH ADDITIONAL VESSEL  07/19/2018   IR ANGIOGRAM VISCERAL SELECTIVE  07/19/2018   IR EMBO TUMOR ORGAN ISCHEMIA INFARCT INC GUIDE ROADMAPPING  07/19/2018   IR RADIOLOGIST EVAL & MGMT  07/07/2018   IR RADIOLOGIST EVAL & MGMT  08/16/2018   IR RADIOLOGIST EVAL & MGMT  01/31/2019   IR RADIOLOGIST EVAL & MGMT  04/25/2019   IR RADIOLOGIST EVAL & MGMT  07/20/2019   IR RADIOLOGIST EVAL & MGMT  10/17/2019   IR RADIOLOGIST EVAL & MGMT  01/30/2020   IR RADIOLOGIST EVAL & MGMT  08/14/2020   IR RADIOLOGIST EVAL & MGMT  02/24/2022   IR RADIOLOGIST EVAL & MGMT  08/11/2022   IR US GUIDE VASC ACCESS LEFT  07/19/2018   left  jaw surgery      MANDIBLE FRACTURE SURGERY      Family History  Problem Relation Age of Onset   Cancer Mother        type unknown   Emphysema Brother    Colon cancer Neg Hx    Esophageal cancer Neg Hx    Pancreatic cancer Neg Hx     Social History   Socioeconomic History   Marital status: Single    Spouse name: Not on file   Number of children: 1   Years of education: Not on file   Highest education level: Not on file  Occupational History   Occupation: works in Quarry manager    Occupation: Retired  Tobacco  Use   Smoking status: Some Days    Current packs/day: 1.00    Average packs/day: 1 pack/day for 50.0 years (50.0 ttl pk-yrs)    Types: Cigarettes   Smokeless tobacco: Never   Tobacco comments:    trying to quit, smoke every other day  Vaping Use   Vaping status: Never Used  Substance and Sexual Activity   Alcohol use: Yes    Alcohol/week: 0.0 standard drinks of alcohol    Comment: 2-3 40 oz beers per day, x50 years   Drug use: No    Comment: remote IV drug use age 72    Sexual activity: Yes    Partners: Female, Male    Comment: patient declines  Other Topics Concern   Not on file  Social History Narrative   Not on file   Social Determinants of Health   Financial Resource Strain: Low Risk  (03/31/2022)   Overall Financial Resource Strain (CARDIA)    Difficulty of Paying Living Expenses: Not very hard  Food Insecurity: No Food Insecurity (03/31/2022)   Hunger Vital Sign    Worried About Running Out of Food in the Last Year: Never true    Ran Out of Food in the Last Year: Never true  Transportation Needs: No Transportation Needs (03/31/2022)   PRAPARE - Administrator, Civil Service (Medical): No    Lack of Transportation (Non-Medical): No  Physical Activity: Sufficiently Active (03/31/2022)   Exercise Vital Sign    Days of Exercise per Week: 7 days    Minutes of Exercise per Session: 30 min  Stress: No Stress Concern Present (03/31/2022)   [employer] of Occupational Health - Occupational Stress Questionnaire    Feeling of Stress : Not at all  Social Connections: Socially Isolated (02/26/2021)   Social Connection and Isolation Panel [NHANES]    Frequency of Communication with Friends and Family: More than three times a week    Frequency of Social Gatherings with Friends and Family: Never    Attends Religious Services: Never    Database administrator or Organizations: No    Attends Engineer, structural: Never    Marital Status: Divorced    No  Known Allergies  Outpatient Medications Prior to Visit  Medication Sig Dispense Refill   chlorproMAZINE (THORAZINE) 10 MG tablet Take 1 tablet (10 mg total) by mouth 3 (three) times daily as needed. For hiccups 90 tablet 0   aspirin EC 81 MG tablet Take 81 mg by mouth every morning. Reported on 12/19/2015 (Patient not taking: Reported on 04/14/2023)     baclofen (LIORESAL) 10 MG tablet Take 1 tablet (10 mg total) by mouth 3 (three) times daily as needed (for hiccups). (Patient not taking: Reported on 12/17/2021) 30 each 0  buPROPion (WELLBUTRIN XL) 150 MG 24 hr tablet Take 1 tablet (150 mg total) by mouth daily for smoking cessation (Patient not taking: Reported on 04/14/2023) 90 tablet 1   amLODipine (NORVASC) 5 MG tablet Take 1 tablet (5 mg total) by mouth daily. 90 tablet 1   atorvastatin (LIPITOR) 20 MG tablet Take 1 tablet (20 mg total) by mouth daily. (Patient not taking: Reported on 04/14/2023) 90 tablet 1   No facility-administered medications prior to visit.     ROS Review of Systems  Constitutional:  Negative for activity change and appetite change.  HENT:  Positive for ear pain. Negative for sinus pressure and sore throat.   Respiratory:  Negative for chest tightness, shortness of breath and wheezing.   Cardiovascular:  Negative for chest pain and palpitations.  Gastrointestinal:  Negative for abdominal distention, abdominal pain and constipation.  Genitourinary: Negative.   Musculoskeletal: Negative.   Psychiatric/Behavioral:  Negative for behavioral problems and dysphoric mood.     Objective:  BP 129/77 (BP Location: Left Arm, Patient Position: Sitting, Cuff Size: Normal)   Pulse 83   Ht 5\' 6"  (1.676 m)   Wt 111 lb (50.3 kg)   SpO2 100%   BMI 17.92 kg/m      04/14/2023    1:39 PM 10/20/2022    9:47 AM 10/12/2022    4:28 PM  BP/Weight  Systolic BP 129 122 147  Diastolic BP 77 78 70  Wt. (Lbs) 111 119.25   BMI 17.92 kg/m2 19.25 kg/m2       Physical  Exam Constitutional:      Appearance: He is well-developed.  HENT:     Right Ear: Tympanic membrane normal.     Left Ear: Tympanic membrane normal.     Ears:     Comments: Edema and tenderness of the left tragus Cardiovascular:     Rate and Rhythm: Normal rate.     Heart sounds: Normal heart sounds. No murmur heard. Pulmonary:     Effort: Pulmonary effort is normal.     Breath sounds: Normal breath sounds. No wheezing or rales.  Chest:     Chest wall: No tenderness.  Abdominal:     General: Bowel sounds are normal. There is no distension.     Palpations: Abdomen is soft. There is no mass.     Tenderness: There is no abdominal tenderness.  Musculoskeletal:        General: Normal range of motion.     Right lower leg: No edema.     Left lower leg: No edema.  Neurological:     Mental Status: He is alert and oriented to person, place, and time.  Psychiatric:        Mood and Affect: Mood normal.        Latest Ref Rng & Units 10/12/2022    4:37 PM 12/19/2021    8:50 AM 03/26/2021   10:31 AM  CMP  Glucose 70 - 99 mg/dL 88  161  92   BUN 8 - 27 mg/dL 8  5  9    Creatinine 0.76 - 1.27 mg/dL 0.96  0.45  4.09   Sodium 134 - 144 mmol/L 139  136  137   Potassium 3.5 - 5.2 mmol/L 4.4  4.3  4.5   Chloride 96 - 106 mmol/L 103  101  100   CO2 20 - 29 mmol/L 22  23  22    Calcium 8.6 - 10.2 mg/dL 9.9  9.8  9.9   Total  Protein 6.0 - 8.5 g/dL 7.2  7.4    Total Bilirubin 0.0 - 1.2 mg/dL <6.0  0.4    Alkaline Phos 44 - 121 IU/L 54  56    AST 0 - 40 IU/L 29  45    ALT 0 - 44 IU/L 11  25      Lipid Panel     Component Value Date/Time   CHOL 156 12/19/2021 0850   TRIG 35 12/19/2021 0850   HDL 100 12/19/2021 0850   CHOLHDL 2.2 03/18/2017 0834   CHOLHDL 2.2 01/05/2014 1620   VLDL 12 01/05/2014 1620   LDLCALC 47 12/19/2021 0850    CBC    Component Value Date/Time   WBC 10.3 08/12/2020 0759   RBC 5.02 08/12/2020 0759   HGB 14.9 08/12/2020 0759   HGB 13.5 06/12/2019 1156   HGB 13.0  05/16/2018 1219   HCT 44.7 08/12/2020 0759   HCT 39.1 05/16/2018 1219   PLT 301 08/12/2020 0759   PLT 266 06/12/2019 1156   PLT 199 05/16/2018 1219   MCV 89.0 08/12/2020 0759   MCV 91 05/16/2018 1219   MCH 29.7 08/12/2020 0759   MCHC 33.3 08/12/2020 0759   RDW 11.6 08/12/2020 0759   RDW 12.1 (L) 05/16/2018 1219   LYMPHSABS 2.3 06/12/2019 1156   LYMPHSABS 1.0 05/16/2018 1219   MONOABS 1.0 06/12/2019 1156   EOSABS 0.1 06/12/2019 1156   EOSABS 0.0 05/16/2018 1219   BASOSABS 0.0 06/12/2019 1156   BASOSABS 0.1 05/16/2018 1219    Lab Results  Component Value Date   HGBA1C 5.0 10/07/2017    Assessment & Plan:      Otitis Externa Left ear swelling and pain for two weeks, suggestive of infection. -Prescribe antibiotic ear drops. -Advise patient to seek emergency care if symptoms do not improve.  Hepatocellular carcinoma s/p IR transcatheter chemo embolization of the liver mass in 07/2018.  Recent MRI showed no new liver lesions, but fatty deposits on liver and changes from previous treatment. Patient unaware of results. -Advise patient to follow up with interventional radiologist for further discussion of results.  Hypertension  Controlled Continue amlodipine Counseled on blood pressure goal of less than 130/80, low-sodium, DASH diet, medication compliance, 150 minutes of moderate intensity exercise per week. Discussed medication compliance, adverse effects.  Hyperlipidemia Controlled Continue statin He is due for lipid panel which has been ordered  Nausea and Vomiting Patient reports vomiting after eating, previous treatments ineffective. -Prescribe Zofran PPI has been ineffective in the past We will check Helicobacter pylori antibody  Hiccups Unfortunately this is chronic uncontrolled Likely due to diaphragmatic muscle spasm Muscle relaxant, chlorpromazine have been ineffective  General Health Maintenance -Administer influenza vaccine today. -Order annual lung  cancer screening due to history of smoking. -Order fasting cholesterol test for tomorrow, 04/15/2023, between 1:30-2:00 PM.          Meds ordered this encounter  Medications   amLODipine (NORVASC) 5 MG tablet    Sig: Take 1 tablet (5 mg total) by mouth daily.    Dispense:  90 tablet    Refill:  1   atorvastatin (LIPITOR) 20 MG tablet    Sig: Take 1 tablet (20 mg total) by mouth daily.    Dispense:  90 tablet    Refill:  1   ciprofloxacin-dexamethasone (CIPRODEX) OTIC suspension    Sig: Place 4 drops into the left ear 2 (two) times daily.    Dispense:  7.5 mL    Refill:  0  Follow-up: Return in about 6 months (around 10/12/2023) for Chronic medical conditions.       Hoy Register, MD, FAAFP. [facility] [place], [place] [phone]   04/14/2023, 2:05 PM

## 2023-04-14 NOTE — Patient Instructions (Signed)
Otitis Externa  Otitis externa is an infection of the outer ear canal. The outer ear canal is the area between the outside of the ear and the eardrum. Otitis externa is sometimes called swimmer's ear. What are the causes? Common causes of this condition include: Swimming in dirty water. Moisture in the ear. An injury to the inside of the ear. An object stuck in the ear. A cut or scrape on the outside of the ear or in the ear canal. What increases the risk? You are more likely to get this condition if you go swimming often. What are the signs or symptoms? Itching in the ear. This is often the first symptom. Swelling of the ear. Redness in the ear. Ear pain. The pain may get worse when you pull on your ear. Pus coming from the ear. How is this treated? This condition may be treated with: Antibiotic ear drops. These are often given for 10-14 days. Medicines to reduce itching and swelling. Follow these instructions at home: If you were prescribed antibiotic ear drops, use them as told by your doctor. Do not stop using them even if you start to feel better. Take over-the-counter and prescription medicines only as told by your doctor. Avoid getting water in your ears as told by your doctor. You may be told to avoid swimming or water sports for a few days. Keep all follow-up visits. How is this prevented? Keep your ears dry. Use the corner of a towel to dry your ears after you swim or bathe. Try not to scratch or put things in your ear. Doing these things makes it easier for germs to grow in your ear. Avoid swimming in lakes, dirty water, or swimming pools that may not have the right amount of a chemical called chlorine. Contact a doctor if: You have a fever. Your ear is still red, swollen, or painful after 3 days. You still have pus coming from your ear after 3 days. Your redness, swelling, or pain gets worse. You have a very bad headache. Get help right away if: You have redness,  swelling, and pain or tenderness behind your ear. Summary Otitis externa is an infection of the outer ear canal. Symptoms include pain, redness, and swelling of the ear. If you were prescribed antibiotic ear drops, use them as told by your doctor. Do not stop using them even if you start to feel better. Try not to scratch or put things in your ear. This information is not intended to replace advice given to you by your health care provider. Make sure you discuss any questions you have with your health care provider. Document Revised: 10/02/2020 Document Reviewed: 10/02/2020 Elsevier Patient Education  2024 [employer]  

## 2023-04-27 ENCOUNTER — Ambulatory Visit
Admission: RE | Admit: 2023-04-27 | Discharge: 2023-04-27 | Disposition: A | Discharge status: Home / Self Care | Payer: Medicare Other | Source: Ambulatory Visit | Attending: Interventional Radiology | Admitting: Interventional Radiology

## 2023-04-27 ENCOUNTER — Ambulatory Visit: Payer: Medicare Other | Attending: Family Medicine

## 2023-04-27 VITALS — Ht 66.0 in | Wt 111.0 lb

## 2023-04-27 DIAGNOSIS — C220 Liver cell carcinoma: Secondary | ICD-10-CM | POA: Diagnosis not present

## 2023-04-27 DIAGNOSIS — Z0000 Encounter for general adult medical examination without abnormal findings: Secondary | ICD-10-CM

## 2023-04-27 HISTORY — PX: IR RADIOLOGIST EVAL & MGMT: IMG5224

## 2023-04-27 NOTE — Progress Notes (Signed)
Subjective:   Calvin Gardner is a 72 y.o. male who presents for Medicare Annual/Subsequent preventive examination.  Visit Complete: Virtual  I connected with  Calvin Gardner on 04/27/23 by a audio enabled telemedicine application and verified that I am speaking with the correct person using two identifiers.  Patient Location: Home  Provider Location: Home Office  I discussed the limitations of evaluation and management by telemedicine. The patient expressed understanding and agreed to proceed.  Because this visit was a virtual/telehealth visit, some criteria may be missing or patient reported. Any vitals not documented were not able to be obtained and vitals that have been documented are patient reported.   Cardiac Risk Factors include: advanced age (>28men, >6 women);male gender;hypertension;dyslipidemia     Objective:    Today's Vitals   04/27/23 2236  Weight: 111 lb (50.3 kg)  Height: 5\' 6"  (1.676 m)   Body mass index is 17.92 kg/m.     04/27/2023   10:39 PM 03/31/2022   11:46 AM 02/26/2021    1:37 PM 07/19/2018    9:32 AM 05/16/2018   11:14 AM 03/12/2017    2:37 PM 02/28/2017    4:22 PM  Advanced Directives  Does Patient Have a Medical Advance Directive? No No No No No No No  Would patient like information on creating a medical advance directive? Yes (MAU/Ambulatory/Procedural Areas - Information given) Yes (ED - Information included in AVS) No - Patient declined No - Patient declined No - Patient declined  No - Patient declined    Current Medications (verified) Outpatient Encounter Medications as of 04/27/2023  Medication Sig   amLODipine (NORVASC) 5 MG tablet Take 1 tablet (5 mg total) by mouth daily.   atorvastatin (LIPITOR) 20 MG tablet Take 1 tablet (20 mg total) by mouth daily.   chlorproMAZINE (THORAZINE) 10 MG tablet Take 1 tablet (10 mg total) by mouth 3 (three) times daily as needed. For hiccups   ciprofloxacin-dexamethasone (CIPRODEX) OTIC suspension  Place 4 drops into the left ear 2 (two) times daily.   ondansetron (ZOFRAN) 4 MG tablet Take 1 tablet (4 mg total) by mouth every 8 (eight) hours as needed for nausea or vomiting.   aspirin EC 81 MG tablet Take 81 mg by mouth every morning. Reported on 12/19/2015 (Patient not taking: Reported on 04/14/2023)   baclofen (LIORESAL) 10 MG tablet Take 1 tablet (10 mg total) by mouth 3 (three) times daily as needed (for hiccups). (Patient not taking: Reported on 12/17/2021)   buPROPion (WELLBUTRIN XL) 150 MG 24 hr tablet Take 1 tablet (150 mg total) by mouth daily for smoking cessation (Patient not taking: Reported on 04/14/2023)   No facility-administered encounter medications on file as of 04/27/2023.    Allergies (verified) Patient has no known allergies.   History: Past Medical History:  Diagnosis Date   Alcoholism (HCC)    Aortic atherosclerosis (HCC)    Arthritis    Cancer (HCC)    liver   Cataract    Hepatitis C    HLD (hyperlipidemia)    Hypertension    Liver mass    Pulmonary nodule    Past Surgical History:  Procedure Laterality Date   COLONOSCOPY WITH PROPOFOL N/A 01/30/2014   Procedure: COLONOSCOPY WITH PROPOFOL;  Surgeon: Charolett Bumpers, MD;  Location: [facility];  Service: Endoscopy;  Laterality: N/A;   ESOPHAGOGASTRODUODENOSCOPY (EGD) WITH PROPOFOL N/A 01/30/2014   Procedure: ESOPHAGOGASTRODUODENOSCOPY (EGD) WITH PROPOFOL;  Surgeon: Charolett Bumpers, MD;  Location: [facility];  Service: Endoscopy;  Laterality: N/A;   IR ANGIOGRAM SELECTIVE EACH ADDITIONAL VESSEL  07/19/2018   IR ANGIOGRAM SELECTIVE EACH ADDITIONAL VESSEL  07/19/2018   IR ANGIOGRAM SELECTIVE EACH ADDITIONAL VESSEL  07/19/2018   IR ANGIOGRAM SELECTIVE EACH ADDITIONAL VESSEL  07/19/2018   IR ANGIOGRAM SELECTIVE EACH ADDITIONAL VESSEL  07/19/2018   IR ANGIOGRAM VISCERAL SELECTIVE  07/19/2018   IR EMBO TUMOR ORGAN ISCHEMIA INFARCT INC GUIDE ROADMAPPING  07/19/2018   IR RADIOLOGIST EVAL & MGMT  07/07/2018    IR RADIOLOGIST EVAL & MGMT  08/16/2018   IR RADIOLOGIST EVAL & MGMT  01/31/2019   IR RADIOLOGIST EVAL & MGMT  04/25/2019   IR RADIOLOGIST EVAL & MGMT  07/20/2019   IR RADIOLOGIST EVAL & MGMT  10/17/2019   IR RADIOLOGIST EVAL & MGMT  01/30/2020   IR RADIOLOGIST EVAL & MGMT  08/14/2020   IR RADIOLOGIST EVAL & MGMT  02/24/2022   IR RADIOLOGIST EVAL & MGMT  08/11/2022   IR RADIOLOGIST EVAL & MGMT  04/27/2023   IR US GUIDE VASC ACCESS LEFT  07/19/2018   left jaw surgery      MANDIBLE FRACTURE SURGERY     Family History  Problem Relation Age of Onset   Cancer Mother        type unknown   Emphysema Brother    Colon cancer Neg Hx    Esophageal cancer Neg Hx    Pancreatic cancer Neg Hx    Social History   Socioeconomic History   Marital status: Single    Spouse name: Not on file   Number of children: 1   Years of education: Not on file   Highest education level: Not on file  Occupational History   Occupation: works in Quarry manager    Occupation: Retired  Tobacco Use   Smoking status: Some Days    Current packs/day: 1.00    Average packs/day: 1 pack/day for 50.0 years (50.0 ttl pk-yrs)    Types: Cigarettes   Smokeless tobacco: Never   Tobacco comments:    trying to quit, smoke every other day  Vaping Use   Vaping status: Never Used  Substance and Sexual Activity   Alcohol use: Yes    Alcohol/week: 0.0 standard drinks of alcohol    Comment: 2-3 40 oz beers per day, x50 years   Drug use: No    Comment: remote IV drug use age 8    Sexual activity: Yes    Partners: Female, Male    Comment: patient declines  Other Topics Concern   Not on file  Social History Narrative   Not on file   Social Determinants of Health   Financial Resource Strain: Low Risk  (04/27/2023)   Overall Financial Resource Strain (CARDIA)    Difficulty of Paying Living Expenses: Not hard at all  Food Insecurity: No Food Insecurity (04/27/2023)   Hunger Vital Sign    Worried About Running Out of Food in the Last  Year: Never true    Ran Out of Food in the Last Year: Never true  Transportation Needs: No Transportation Needs (04/27/2023)   PRAPARE - Administrator, Civil Service (Medical): No    Lack of Transportation (Non-Medical): No  Physical Activity: Sufficiently Active (04/27/2023)   Exercise Vital Sign    Days of Exercise per Week: 7 days    Minutes of Exercise per Session: 30 min  Stress: No Stress Concern Present (04/27/2023)   [employer] of Occupational Health - Occupational  Stress Questionnaire    Feeling of Stress : Not at all  Social Connections: Socially Isolated (04/27/2023)   Social Connection and Isolation Panel [NHANES]    Frequency of Communication with Friends and Family: Three times a week    Frequency of Social Gatherings with Friends and Family: Never    Attends Religious Services: Never    Database administrator or Organizations: No    Attends Engineer, structural: Never    Marital Status: Divorced    Tobacco Counseling Ready to quit: Not Answered Counseling given: Not Answered Tobacco comments: trying to quit, smoke every other day   Clinical Intake:  Pre-visit preparation completed: Yes  Pain : No/denies pain     Diabetes: No  How often do you need to have someone help you when you read instructions, pamphlets, or other written materials from your doctor or pharmacy?: 1 - Never  Interpreter Needed?: No  Information entered by :: Kandis Fantasia LPN   Activities of Daily Living    04/27/2023   10:37 PM  In your present state of health, do you have any difficulty performing the following activities:  Hearing? 0  Vision? 0  Difficulty concentrating or making decisions? 0  Walking or climbing stairs? 0  Dressing or bathing? 0  Doing errands, shopping? 0  Preparing Food and eating ? N  Using the Toilet? N  In the past six months, have you accidently leaked urine? N  Do you have problems with loss of bowel control? N  Managing  your Medications? N  Managing your Finances? N  Housekeeping or managing your Housekeeping? N    Patient Care Team: Hoy Register, MD as PCP - General (Family Medicine) Sterling Big, MD as Consulting Physician (Interventional Radiology)  Indicate any recent Medical Services you may have received from other than [facility] providers in the past year (date may be approximate).     Assessment:   This is a routine wellness examination for Calvin Gardner.  Hearing/Vision screen Hearing Screening - Comments:: Denies hearing difficulties   Vision Screening - Comments:: No vision problems; will schedule routine eye exam soon     Goals Addressed   None   Depression Screen    04/27/2023   10:39 PM 10/12/2022    3:55 PM 03/31/2022   11:46 AM 12/17/2021    1:45 PM 03/26/2021    9:33 AM 02/26/2021    1:31 PM 06/25/2020   11:26 AM  PHQ 2/9 Scores  PHQ - 2 Score 0 0 0 0 0 0 0  PHQ- 9 Score  0  0 0  0    Fall Risk    04/27/2023   10:40 PM 10/12/2022    3:55 PM 03/31/2022   11:46 AM 12/17/2021    1:45 PM 02/26/2021    1:37 PM  Fall Risk   Falls in the past year? 0 0 0 0 0  Number falls in past yr: 0 0 0 0 0  Injury with Fall? 0 0 0 0 0  Risk for fall due to : No Fall Risks  No Fall Risks  No Fall Risks  Follow up Falls prevention discussed;Education provided;Falls evaluation completed  Education provided      MEDICARE RISK AT HOME: Medicare Risk at Home Any stairs in or around the home?: No If so, are there any without handrails?: No Home free of loose throw rugs in walkways, pet beds, electrical cords, etc?: Yes Adequate lighting in your home to reduce risk  of falls?: Yes Life alert?: No Use of a cane, walker or w/c?: No Grab bars in the bathroom?: Yes Shower chair or bench in shower?: No Elevated toilet seat or a handicapped toilet?: No  TIMED UP AND GO:  Was the test performed?  No    Cognitive Function:    03/31/2022   11:47 AM  MMSE - Mini Mental State Exam  Orientation  to time 5  Orientation to Place 5  Registration 3  Attention/ Calculation 5  Recall 3  Language- name 2 objects 2  Language- repeat 1  Language- follow 3 step command 3  Language- read & follow direction 1  Write a sentence 1  Copy design 1  Total score 30        04/27/2023   10:40 PM 03/31/2022   11:48 AM  6CIT Screen  What Year? 0 points 0 points  What month? 0 points 0 points  What time? 0 points 0 points  Count back from 20 2 points 4 points  Months in reverse 2 points 4 points  Repeat phrase 4 points 6 points  Total Score 8 points 14 points    Immunizations Immunization History  Administered Date(s) Administered   Hepatitis A, Adult 09/19/2014   Influenza,inj,Quad PF,6+ Mos 06/20/2015, 06/11/2017, 08/31/2018, 08/10/2019, 07/02/2020, 03/26/2021   Moderna Sars-Covid-2 Vaccination 12/19/2019, 01/16/2020   PFIZER(Purple Top)SARS-COV-2 Vaccination 12/24/2020   Pneumococcal Conjugate-13 08/31/2018   Pneumococcal Polysaccharide-23 07/06/2014, 03/26/2021   Zoster Recombinant(Shingrix) 12/17/2021    TDAP status: Due, Education has been provided regarding the importance of this vaccine. Advised may receive this vaccine at local pharmacy or Health Dept. Aware to provide a copy of the vaccination record if obtained from local pharmacy or Health Dept. Verbalized acceptance and understanding.  Flu Vaccine status: Due, Education has been provided regarding the importance of this vaccine. Advised may receive this vaccine at local pharmacy or Health Dept. Aware to provide a copy of the vaccination record if obtained from local pharmacy or Health Dept. Verbalized acceptance and understanding.  Pneumococcal vaccine status: Up to date  Covid-19 vaccine status: Information provided on how to obtain vaccines.   Qualifies for Shingles Vaccine? Yes   Zostavax completed No   Shingrix Completed?: No.    Education has been provided regarding the importance of this vaccine. Patient has been  advised to call insurance company to determine out of pocket expense if they have not yet received this vaccine. Advised may also receive vaccine at local pharmacy or Health Dept. Verbalized acceptance and understanding.  Screening Tests Health Maintenance  Topic Date Due   Zoster Vaccines- Shingrix (2 of 2) 02/11/2022   Lung Cancer Screening  01/24/2023   COVID-19 Vaccine (4 - 2023-24 season) 04/04/2023   INFLUENZA VACCINE  11/01/2023 (Originally 03/04/2023)   Colonoscopy  01/31/2024   Medicare Annual Wellness (AWV)  04/26/2024   Pneumonia Vaccine 44+ Years old  Completed   Hepatitis C Screening  Completed   HPV VACCINES  Aged Out   DTaP/Tdap/Td  Discontinued    Health Maintenance  Health Maintenance Due  Topic Date Due   Zoster Vaccines- Shingrix (2 of 2) 02/11/2022   Lung Cancer Screening  01/24/2023   COVID-19 Vaccine (4 - 2023-24 season) 04/04/2023    Colorectal cancer screening: Type of screening: Colonoscopy. Completed 01/30/14. Repeat every 10 years  Lung Cancer Screening: (Low Dose CT Chest recommended if Age 18-80 years, 20 pack-year currently smoking OR have quit w/in 15years.) does qualify.   Lung Cancer Screening  Referral: scheduled for 04/29/23  Additional Screening:  Hepatitis C Screening: does qualify; Completed 03/12/17  Vision Screening: Recommended annual ophthalmology exams for early detection of glaucoma and other disorders of the eye. Is the patient up to date with their annual eye exam?  Yes  Who is the provider or what is the name of the office in which the patient attends annual eye exams? [facility] If pt is not established with a provider, would they like to be referred to a provider to establish care? No .   Dental Screening: Recommended annual dental exams for proper oral hygiene  Community Resource Referral / Chronic Care Management: CRR required this visit?  No   CCM required this visit?  No     Plan:     I have personally reviewed  and noted the following in the patient's chart:   Medical and social history Use of alcohol, tobacco or illicit drugs  Current medications and supplements including opioid prescriptions. Patient is not currently taking opioid prescriptions. Functional ability and status Nutritional status Physical activity Advanced directives List of other physicians Hospitalizations, surgeries, and ER visits in previous 12 months Vitals Screenings to include cognitive, depression, and falls Referrals and appointments  In addition, I have reviewed and discussed with patient certain preventive protocols, quality metrics, and best practice recommendations. A written personalized care plan for preventive services as well as general preventive health recommendations were provided to patient.     Kandis Fantasia [place], [place]   1/61/0960   After Visit Summary: (Declined) Due to this being a telephonic visit, with patients personalized plan was offered to patient but patient Declined AVS at this time   Nurse Notes: No concerns at this time

## 2023-04-27 NOTE — Patient Instructions (Signed)
Calvin Gardner , Thank you for taking time to come for your Medicare Wellness Visit. I appreciate your ongoing commitment to your health goals. Please review the following plan we discussed and let me know if I can assist you in the future.   Referrals/Orders/Follow-Ups/Clinician Recommendations: Aim for 30 minutes of exercise or brisk walking, 6-8 glasses of water, and 5 servings of fruits and vegetables each day.  This is a list of the screening recommended for you and due dates:  Health Maintenance  Topic Date Due   Zoster (Shingles) Vaccine (2 of 2) 02/11/2022   Screening for Lung Cancer  01/24/2023   COVID-19 Vaccine (4 - 2023-24 season) 04/04/2023   Flu Shot  11/01/2023*   Colon Cancer Screening  01/31/2024   Medicare Annual Wellness Visit  04/26/2024   Pneumonia Vaccine  Completed   Hepatitis C Screening  Completed   HPV Vaccine  Aged Out   DTaP/Tdap/Td vaccine  Discontinued  *Topic was postponed. The date shown is not the original due date.    Advanced directives: (ACP Link)Information on Advanced Care Planning can be found at [facility] [place] Advance Health Care Directives Advance Health Care Directives ([email or web address]   Next Medicare Annual Wellness Visit scheduled for next year: Yes

## 2023-04-27 NOTE — Progress Notes (Signed)
Chief Complaint: Patient was consulted remotely today (TeleHealth) for hepatocellular cancer at the request of Aloria Looper K.    Referring Physician(s): Dr. Almond Lint   History of Present Illness: Calvin Gardner is a 72 y.o. male with a history of HCV and EtOH cirrhosis complicated by development of a large 7.4 cm partially exophytic mass arising from the inferior aspect of hepatic segment 6.  MR imaging demonstrates LI-RADS 5 imaging features diagnostic of hepatocellular carcinoma.     He was deemed not to be a surgical candidate and therefore underwent liver directed therapy with drug-eluting bead chemoembolization on 07/19/2018.     Surveillance MRI imaging was performed on 10/12/2019.  Excellent continued response to therapy with further involution of the treated segment 6 mass.  There has been a nearly 50% reduction in lesion volume.     MRI imaging dated 01/26/2020 demonstrates a further reduction in volume of the previously treated lesion.  No evidence of residual or recurrent disease.  A small 6 mm focus of subcapsular enhancement is present peripherally and may represent a benign vascular phenomenon or regenerating nodule.   MRI dated 07/31/20:  No significant change in right hepatic lobe mass status post chemoembolization. Nodular soft tissue enhancement along its medial wall remains stable, but is suspicious for residual carcinoma. Recommend continued follow-up by MRI in 6 months.   MRI 02/06/2021: No change in volume of RIGHT hepatic lobe hepatocellular carcinoma following chemoembolization.  Persistent enhancement along the medial border of the lesion concerning for residual albeit nonprogressive carcinoma.   MRI 02/06/2022: Decreased size of the embolization defect within segments 5/6.  Similar appearance of apparent nodular enhancement along the medial aspect of the embolization site. Cannot exclude stable residual disease. Alternatively, given ongoing size stability,  this apparent enhancement could relate to an adjacent diverticulum within the hepatic flexure of the colon.   MRI 07/29/22: Expected evolution of remote ablation site centered about segments 5 and 6. The previously described enhancement along the medial portion of the ablation site is decreased, favored to be secondary to an adjacent colonic diverticulum. No new liver lesion and no evidence of abdominal metastatic disease.   MRI 03/09/23:  Unchanged nonenhancing post treated lesion of the inferior right lobe of the liver, hepatic segment V/VI measuring 2.2 x 1.9 cm. LI-RADS TR nonviable.   Calvin Gardner continues to do very well overall.  His current complaint is persistent hiccups and weight loss for which he has seen GI and is having a chest CT performed soon.   Past Medical History:  Diagnosis Date   Alcoholism (HCC)    Aortic atherosclerosis (HCC)    Arthritis    Cancer (HCC)    liver   Cataract    Hepatitis C    HLD (hyperlipidemia)    Hypertension    Liver mass    Pulmonary nodule     Past Surgical History:  Procedure Laterality Date   COLONOSCOPY WITH PROPOFOL N/A 01/30/2014   Procedure: COLONOSCOPY WITH PROPOFOL;  Surgeon: Charolett Bumpers, MD;  Location: [facility];  Service: Endoscopy;  Laterality: N/A;   ESOPHAGOGASTRODUODENOSCOPY (EGD) WITH PROPOFOL N/A 01/30/2014   Procedure: ESOPHAGOGASTRODUODENOSCOPY (EGD) WITH PROPOFOL;  Surgeon: Charolett Bumpers, MD;  Location: [facility];  Service: Endoscopy;  Laterality: N/A;   IR ANGIOGRAM SELECTIVE EACH ADDITIONAL VESSEL  07/19/2018   IR ANGIOGRAM SELECTIVE EACH ADDITIONAL VESSEL  07/19/2018   IR ANGIOGRAM SELECTIVE EACH ADDITIONAL VESSEL  07/19/2018   IR ANGIOGRAM SELECTIVE EACH ADDITIONAL VESSEL  07/19/2018   IR ANGIOGRAM SELECTIVE EACH ADDITIONAL VESSEL  07/19/2018   IR ANGIOGRAM VISCERAL SELECTIVE  07/19/2018   IR EMBO TUMOR ORGAN ISCHEMIA INFARCT INC GUIDE ROADMAPPING  07/19/2018   IR RADIOLOGIST EVAL & MGMT  07/07/2018    IR RADIOLOGIST EVAL & MGMT  08/16/2018   IR RADIOLOGIST EVAL & MGMT  01/31/2019   IR RADIOLOGIST EVAL & MGMT  04/25/2019   IR RADIOLOGIST EVAL & MGMT  07/20/2019   IR RADIOLOGIST EVAL & MGMT  10/17/2019   IR RADIOLOGIST EVAL & MGMT  01/30/2020   IR RADIOLOGIST EVAL & MGMT  08/14/2020   IR RADIOLOGIST EVAL & MGMT  02/24/2022   IR RADIOLOGIST EVAL & MGMT  08/11/2022   IR RADIOLOGIST EVAL & MGMT  04/27/2023   IR US GUIDE VASC ACCESS LEFT  07/19/2018   left jaw surgery      MANDIBLE FRACTURE SURGERY      Allergies: Patient has no known allergies.  Medications: Prior to Admission medications   Medication Sig Start Date End Date Taking? Authorizing Provider  amLODipine (NORVASC) 5 MG tablet Take 1 tablet (5 mg total) by mouth daily. 04/14/23   Hoy Register, MD  aspirin EC 81 MG tablet Take 81 mg by mouth every morning. Reported on 12/19/2015 Patient not taking: Reported on 04/14/2023    [provider]  atorvastatin (LIPITOR) 20 MG tablet Take 1 tablet (20 mg total) by mouth daily. 04/14/23   Hoy Register, MD  baclofen (LIORESAL) 10 MG tablet Take 1 tablet (10 mg total) by mouth 3 (three) times daily as needed (for hiccups). Patient not taking: Reported on 12/17/2021 03/26/21   Hoy Register, MD  buPROPion (WELLBUTRIN XL) 150 MG 24 hr tablet Take 1 tablet (150 mg total) by mouth daily for smoking cessation Patient not taking: Reported on 04/14/2023 10/12/22   Hoy Register, MD  chlorproMAZINE (THORAZINE) 10 MG tablet Take 1 tablet (10 mg total) by mouth 3 (three) times daily as needed. For hiccups 12/17/21   Hoy Register, MD  ciprofloxacin-dexamethasone (CIPRODEX) OTIC suspension Place 4 drops into the left ear 2 (two) times daily. 04/14/23   Hoy Register, MD  ondansetron (ZOFRAN) 4 MG tablet Take 1 tablet (4 mg total) by mouth every 8 (eight) hours as needed for nausea or vomiting. 04/14/23   Hoy Register, MD     Family History  Problem Relation Age of Onset   Cancer Mother         type unknown   Emphysema Brother    Colon cancer Neg Hx    Esophageal cancer Neg Hx    Pancreatic cancer Neg Hx     Social History   Socioeconomic History   Marital status: Single    Spouse name: Not on file   Number of children: 1   Years of education: Not on file   Highest education level: Not on file  Occupational History   Occupation: works in Quarry manager    Occupation: Retired  Tobacco Use   Smoking status: Some Days    Current packs/day: 1.00    Average packs/day: 1 pack/day for 50.0 years (50.0 ttl pk-yrs)    Types: Cigarettes   Smokeless tobacco: Never   Tobacco comments:    trying to quit, smoke every other day  Vaping Use   Vaping status: Never Used  Substance and Sexual Activity   Alcohol use: Yes    Alcohol/week: 0.0 standard drinks of alcohol    Comment: 2-3 40 oz beers per day,  x50 years   Drug use: No    Comment: remote IV drug use age 70    Sexual activity: Yes    Partners: Female, Male    Comment: patient declines  Other Topics Concern   Not on file  Social History Narrative   Not on file   Social Determinants of Health   Financial Resource Strain: Low Risk  (03/31/2022)   Overall Financial Resource Strain (CARDIA)    Difficulty of Paying Living Expenses: Not very hard  Food Insecurity: No Food Insecurity (03/31/2022)   Hunger Vital Sign    Worried About Running Out of Food in the Last Year: Never true    Ran Out of Food in the Last Year: Never true  Transportation Needs: No Transportation Needs (03/31/2022)   PRAPARE - Administrator, Civil Service (Medical): No    Lack of Transportation (Non-Medical): No  Physical Activity: Sufficiently Active (03/31/2022)   Exercise Vital Sign    Days of Exercise per Week: 7 days    Minutes of Exercise per Session: 30 min  Stress: No Stress Concern Present (03/31/2022)   [employer] of Occupational Health - Occupational Stress Questionnaire    Feeling of Stress : Not at all  Social  Connections: Socially Isolated (02/26/2021)   Social Connection and Isolation Panel [NHANES]    Frequency of Communication with Friends and Family: More than three times a week    Frequency of Social Gatherings with Friends and Family: Never    Attends Religious Services: Never    Database administrator or Organizations: No    Attends Engineer, structural: Never    Marital Status: Divorced    ECOG Status: 0 - Asymptomatic  Review of Systems  Review of Systems: A 12 point ROS discussed and pertinent positives are indicated in the HPI above.  All other systems are negative.  Advance Care Plan: The advanced care plan/surrogate decision maker was discussed at the time of visit and the patient did not wish to discuss or was not able to name a surrogate decision maker or provide an advance care plan.    Physical Exam No direct physical exam was performed (except for noted visual exam findings with Video Visits).    Vital Signs: There were no vitals taken for this visit.  Imaging: IR Radiologist Eval & Mgmt  Result Date: 04/27/2023 EXAM: ESTABLISHED PATIENT OFFICE VISIT CHIEF COMPLAINT: SEE EPIC NOTE HISTORY OF PRESENT ILLNESS: SEE EPIC NOTE REVIEW OF SYSTEMS: SEE EPIC NOTE PHYSICAL EXAMINATION: SEE EPIC NOTE ASSESSMENT AND PLAN: SEE EPIC NOTE Electronically Signed   By: Malachy Moan M.D.   On: 04/27/2023 10:00    Labs:  CBC: No results for input(s): "WBC", "HGB", "HCT", "PLT" in the last 8760 hours.  COAGS: No results for input(s): "INR", "APTT" in the last 8760 hours.  BMP: Recent Labs    10/12/22 1637  NA 139  K 4.4  CL 103  CO2 22  GLUCOSE 88  BUN 8  CALCIUM 9.9  CREATININE 0.82    LIVER FUNCTION TESTS: Recent Labs    10/12/22 1637  BILITOT <0.2  AST 29  ALT 11  ALKPHOS 54  PROT 7.2  ALBUMIN 4.4    TUMOR MARKERS: No results for input(s): "AFPTM", "CEA", "CA199", "CHROMGRNA" in the last 8760 hours.  Assessment and Plan:  Continuing to do  well now nearly 5 years status post drug-eluting bead chemoembolization of 6.7 cm segment 6 hepatocellular carcinoma.  Continued complete  response to therapy.   1.)  Next MRI abdomen with gadolinium contrast and accompanying clinic visit in 6 months. (March 2025).    Electronically Signed: Sterling Big 04/27/2023, 11:36 AM   I spent a total of 15 Minutes in remote  clinical consultation, greater than 50% of which was counseling/coordinating care for hepatocellular cancer follow-up.    Visit type: Audio only (telephone). Audio (no video) only due to patient's lack of internet/smartphone capability. Alternative for in-person consultation at [facility], [address] [place], [place], [place]. This visit type was conducted due to national recommendations for restrictions regarding the COVID-19 Pandemic (e.g. social distancing).  This format is felt to be most appropriate for this patient at this time.  All issues noted in this document were discussed and addressed.

## 2023-04-28 ENCOUNTER — Ambulatory Visit: Payer: Medicare Other | Attending: Family Medicine

## 2023-04-28 DIAGNOSIS — Z23 Encounter for immunization: Secondary | ICD-10-CM | POA: Diagnosis not present

## 2023-04-28 NOTE — Progress Notes (Signed)
Patient identified by name and date of birth.  Patient was given the flu vaccine injection in his left deltoid Patient tolerated well. VIS flu vaccine information sheet given. No further questions or concerns.

## 2023-04-29 ENCOUNTER — Ambulatory Visit
Admission: RE | Admit: 2023-04-29 | Discharge: 2023-04-29 | Disposition: A | Discharge status: Home / Self Care | Payer: Medicare Other | Source: Ambulatory Visit | Attending: Family Medicine | Admitting: Family Medicine

## 2023-04-29 DIAGNOSIS — F17210 Nicotine dependence, cigarettes, uncomplicated: Secondary | ICD-10-CM

## 2023-06-28 ENCOUNTER — Telehealth: Payer: Self-pay | Admitting: *Deleted

## 2023-06-28 NOTE — Telephone Encounter (Addendum)
Dr. Baxter Flattery team follow up required: Patient need assistance with scheduling colonoscopy if needed. Please contact patient at [phone] or Elmer Picker BSN, RN,CCM, Health Equity Program Manager (HEPM) at [phone] by 07/05/23 if additional information needed.  Chart reviewed and [employer] verification completed.   Telephone call to patient, verify patient's name, date of birth, and address. Patient in agreement to outreach and colon cancer screening (CRC) follow up. Discussed patient's colon cancer screening history per chart review.  Patient had last colonoscopy on 01/30/2014 at [facility].  Patient states several polyps were found and does not recall the due date of next colonoscopy. Per health maintenance next colonoscopy due on 01/31/2024.  Patient advised HEPM will follow up with Dr. Alvis Lemmings to verify due date.  Patient states he is willing to obtain CRC colonoscopy whenever it is due and still has Medicaid insurance. States his goal is living a long healthy and happy life. States he major concern is chronic hiccups and weight loss.  States "Dr. Alvis Lemmings is a great doctor, she is taking great care of him, and referring him to the right doctors".  States his liver cancer is gone and follow ups with liver cancer doctor every 6 months.  States he is will have a follow visit with Dr. Alvis Lemmings in 6 months.  Patient states he is very appreciative of follow up call from HEPM.  HEPM advised she or Dr. Baxter Flattery office will call next week regarding colonoscopy due date. Patient in agreement with follow up.  Patient next appointment with Dr. Alvis Lemmings on 10/12/23 and HEPM updated appointment note with above request.   Dylann Gallier H. Excell Seltzer BSN, Charity fundraiser, [facility]

## 2023-06-30 ENCOUNTER — Other Ambulatory Visit: Payer: Self-pay | Admitting: Family Medicine

## 2023-06-30 DIAGNOSIS — Z1211 Encounter for screening for malignant neoplasm of colon: Secondary | ICD-10-CM

## 2023-06-30 NOTE — Telephone Encounter (Signed)
I have placed referral for colonoscopy

## 2023-06-30 NOTE — Telephone Encounter (Signed)
I called the patient and informed him that Dr Alvis Lemmings placed the order for the colonoscopy and GI would be calling him to schedule an appointment with one of their providers and then they will schedule the colonoscopy.  He was very appreciative and I told him to please call this clinic if has any questions

## 2023-09-14 DIAGNOSIS — H538 Other visual disturbances: Secondary | ICD-10-CM | POA: Diagnosis not present

## 2023-09-14 DIAGNOSIS — Z961 Presence of intraocular lens: Secondary | ICD-10-CM | POA: Diagnosis not present

## 2023-09-29 ENCOUNTER — Other Ambulatory Visit: Payer: Self-pay | Admitting: Interventional Radiology

## 2023-09-29 DIAGNOSIS — C220 Liver cell carcinoma: Secondary | ICD-10-CM

## 2023-10-01 ENCOUNTER — Encounter: Payer: Self-pay | Admitting: Gastroenterology

## 2023-10-08 ENCOUNTER — Telehealth: Payer: Self-pay | Admitting: Family Medicine

## 2023-10-08 NOTE — Telephone Encounter (Signed)
 Contacted pt confirm appt!

## 2023-10-12 ENCOUNTER — Ambulatory Visit: Payer: Self-pay | Attending: Family Medicine | Admitting: Family Medicine

## 2023-10-12 ENCOUNTER — Encounter: Payer: Self-pay | Admitting: Family Medicine

## 2023-10-12 ENCOUNTER — Other Ambulatory Visit: Payer: Self-pay

## 2023-10-12 VITALS — BP 139/81 | HR 106 | Ht 66.0 in | Wt 122.2 lb

## 2023-10-12 DIAGNOSIS — F17210 Nicotine dependence, cigarettes, uncomplicated: Secondary | ICD-10-CM

## 2023-10-12 DIAGNOSIS — I10 Essential (primary) hypertension: Secondary | ICD-10-CM

## 2023-10-12 DIAGNOSIS — R066 Hiccough: Secondary | ICD-10-CM

## 2023-10-12 DIAGNOSIS — Z79899 Other long term (current) drug therapy: Secondary | ICD-10-CM | POA: Diagnosis not present

## 2023-10-12 DIAGNOSIS — C220 Liver cell carcinoma: Secondary | ICD-10-CM | POA: Diagnosis not present

## 2023-10-12 MED ORDER — ATORVASTATIN CALCIUM 20 MG PO TABS
20.0000 mg | ORAL_TABLET | Freq: Every day | ORAL | 1 refills | Status: DC
  Filled 2023-10-12: qty 90, 90d supply, fill #0
  Filled 2024-01-07: qty 90, 90d supply, fill #1

## 2023-10-12 MED ORDER — AMLODIPINE BESYLATE 5 MG PO TABS
5.0000 mg | ORAL_TABLET | Freq: Every day | ORAL | 1 refills | Status: DC
  Filled 2023-10-12: qty 90, 90d supply, fill #0
  Filled 2024-01-07: qty 90, 90d supply, fill #1

## 2023-10-12 MED ORDER — CHLORPROMAZINE HCL 10 MG PO TABS
10.0000 mg | ORAL_TABLET | Freq: Three times a day (TID) | ORAL | 1 refills | Status: DC | PRN
  Filled 2023-10-12: qty 90, 30d supply, fill #0

## 2023-10-12 MED ORDER — NICOTINE 14 MG/24HR TD PT24
14.0000 mg | MEDICATED_PATCH | Freq: Every day | TRANSDERMAL | 1 refills | Status: DC
  Filled 2023-10-12: qty 28, 28d supply, fill #0

## 2023-10-12 NOTE — Progress Notes (Signed)
 Subjective:  Patient ID: Calvin Gardner, male    DOB: May 12, 1951  Age: 73 y.o. MRN: [id number]  CC: Medical Management of Chronic Issues (Still has hiccups)     Discussed the use of AI scribe software for clinical note transcription with the patient, who gave verbal consent to proceed.  History of Present Illness The patient, with a history of hypertension, GERD, tobacco  (1ppd since the age of 45) and alcohol abuse, hepatocellular carcinoma (stage IIIa cT3,cN0,cM0) s/p IR transcatheter chemo embolization of the liver mass in 07/2018 presents with persistent hiccups. Despite previous trials of clopramazine and a muscle relaxant, the hiccups have not improved. The patient reports that the hiccups can last for a week without stopping. He denies any associated symptoms such as gas or burping.  The patient also reports intermittent smoking, despite previous attempts to quit using Wellbutrin, which he discontinued due to an unpleasant taste. He expresses interest in trying nicotine patches as an alternative.  The patient is scheduled for a colonoscopy in April. He is compliant with his amlodipine for hypertension but is unsure about his atorvastatin for hyperlipidemia.    Past Medical History:  Diagnosis Date   Alcoholism (HCC)    Aortic atherosclerosis (HCC)    Arthritis    Cancer (HCC)    liver   Cataract    Hepatitis C    HLD (hyperlipidemia)    Hypertension    Liver mass    Pulmonary nodule     Past Surgical History:  Procedure Laterality Date   COLONOSCOPY WITH PROPOFOL N/A 01/30/2014   Procedure: COLONOSCOPY WITH PROPOFOL;  Surgeon: Charolett Bumpers, MD;  Location: [facility];  Service: Endoscopy;  Laterality: N/A;   ESOPHAGOGASTRODUODENOSCOPY (EGD) WITH PROPOFOL N/A 01/30/2014   Procedure: ESOPHAGOGASTRODUODENOSCOPY (EGD) WITH PROPOFOL;  Surgeon: Charolett Bumpers, MD;  Location: [facility];  Service: Endoscopy;  Laterality: N/A;   IR ANGIOGRAM SELECTIVE EACH  ADDITIONAL VESSEL  07/19/2018   IR ANGIOGRAM SELECTIVE EACH ADDITIONAL VESSEL  07/19/2018   IR ANGIOGRAM SELECTIVE EACH ADDITIONAL VESSEL  07/19/2018   IR ANGIOGRAM SELECTIVE EACH ADDITIONAL VESSEL  07/19/2018   IR ANGIOGRAM SELECTIVE EACH ADDITIONAL VESSEL  07/19/2018   IR ANGIOGRAM VISCERAL SELECTIVE  07/19/2018   IR EMBO TUMOR ORGAN ISCHEMIA INFARCT INC GUIDE ROADMAPPING  07/19/2018   IR RADIOLOGIST EVAL & MGMT  07/07/2018   IR RADIOLOGIST EVAL & MGMT  08/16/2018   IR RADIOLOGIST EVAL & MGMT  01/31/2019   IR RADIOLOGIST EVAL & MGMT  04/25/2019   IR RADIOLOGIST EVAL & MGMT  07/20/2019   IR RADIOLOGIST EVAL & MGMT  10/17/2019   IR RADIOLOGIST EVAL & MGMT  01/30/2020   IR RADIOLOGIST EVAL & MGMT  08/14/2020   IR RADIOLOGIST EVAL & MGMT  02/24/2022   IR RADIOLOGIST EVAL & MGMT  08/11/2022   IR RADIOLOGIST EVAL & MGMT  04/27/2023   IR US GUIDE VASC ACCESS LEFT  07/19/2018   left jaw surgery      MANDIBLE FRACTURE SURGERY      Family History  Problem Relation Age of Onset   Cancer Mother        type unknown   Emphysema Brother    Colon cancer Neg Hx    Esophageal cancer Neg Hx    Pancreatic cancer Neg Hx     Social History   Socioeconomic History   Marital status: Single    Spouse name: Not on file   Number of children: 1   Years  of education: Not on file   Highest education level: Not on file  Occupational History   Occupation: works in Quarry manager    Occupation: Retired  Tobacco Use   Smoking status: Some Days    Current packs/day: 1.00    Average packs/day: 1 pack/day for 50.0 years (50.0 ttl pk-yrs)    Types: Cigarettes   Smokeless tobacco: Never   Tobacco comments:    trying to quit, smoke every other day  Vaping Use   Vaping status: Never Used  Substance and Sexual Activity   Alcohol use: Yes    Alcohol/week: 0.0 standard drinks of alcohol    Comment: 2-3 40 oz beers per day, x50 years   Drug use: No    Comment: remote IV drug use age 32    Sexual activity: Yes     Partners: Female, Male    Comment: patient declines  Other Topics Concern   Not on file  Social History Narrative   Not on file   Social Drivers of Health   Financial Resource Strain: Low Risk  (04/27/2023)   Overall Financial Resource Strain (CARDIA)    Difficulty of Paying Living Expenses: Not hard at all  Food Insecurity: No Food Insecurity (04/27/2023)   Hunger Vital Sign    Worried About Running Out of Food in the Last Year: Never true    Ran Out of Food in the Last Year: Never true  Transportation Needs: No Transportation Needs (04/27/2023)   PRAPARE - Administrator, Civil Service (Medical): No    Lack of Transportation (Non-Medical): No  Physical Activity: Sufficiently Active (04/27/2023)   Exercise Vital Sign    Days of Exercise per Week: 7 days    Minutes of Exercise per Session: 30 min  Stress: No Stress Concern Present (04/27/2023)   [employer] of Occupational Health - Occupational Stress Questionnaire    Feeling of Stress : Not at all  Social Connections: Socially Isolated (04/27/2023)   Social Connection and Isolation Panel [NHANES]    Frequency of Communication with Friends and Family: Three times a week    Frequency of Social Gatherings with Friends and Family: Never    Attends Religious Services: Never    Database administrator or Organizations: No    Attends Engineer, structural: Never    Marital Status: Divorced    No Known Allergies  Outpatient Medications Prior to Visit  Medication Sig Dispense Refill   amLODipine (NORVASC) 5 MG tablet Take 1 tablet (5 mg total) by mouth daily. 90 tablet 1   aspirin EC 81 MG tablet Take 81 mg by mouth every morning. Reported on 12/19/2015     atorvastatin (LIPITOR) 20 MG tablet Take 1 tablet (20 mg total) by mouth daily. (Patient not taking: Reported on 10/12/2023) 90 tablet 1   baclofen (LIORESAL) 10 MG tablet Take 1 tablet (10 mg total) by mouth 3 (three) times daily as needed (for hiccups).  (Patient not taking: Reported on 10/12/2023) 30 each 0   buPROPion (WELLBUTRIN XL) 150 MG 24 hr tablet Take 1 tablet (150 mg total) by mouth daily for smoking cessation (Patient not taking: Reported on 10/12/2023) 90 tablet 1   chlorproMAZINE (THORAZINE) 10 MG tablet Take 1 tablet (10 mg total) by mouth 3 (three) times daily as needed. For hiccups (Patient not taking: Reported on 10/12/2023) 90 tablet 0   ciprofloxacin-dexamethasone (CIPRODEX) OTIC suspension Place 4 drops into the left ear 2 (two) times daily. (Patient not taking:  Reported on 10/12/2023) 7.5 mL 0   ondansetron (ZOFRAN) 4 MG tablet Take 1 tablet (4 mg total) by mouth every 8 (eight) hours as needed for nausea or vomiting. (Patient not taking: Reported on 10/12/2023) 20 tablet 1   No facility-administered medications prior to visit.     ROS Review of Systems  Constitutional:  Negative for activity change and appetite change.  HENT:  Negative for sinus pressure and sore throat.   Respiratory:  Negative for chest tightness, shortness of breath and wheezing.   Cardiovascular:  Negative for chest pain and palpitations.  Gastrointestinal:  Negative for abdominal distention, abdominal pain and constipation.  Genitourinary: Negative.   Musculoskeletal: Negative.   Psychiatric/Behavioral:  Negative for behavioral problems and dysphoric mood.     Objective:  BP 139/81   Pulse (!) 106   Ht 5\' 6"  (1.676 m)   Wt 122 lb 3.2 oz (55.4 kg)   SpO2 100%   BMI 19.72 kg/m      10/12/2023    1:26 PM 04/27/2023   10:36 PM 04/14/2023    1:39 PM  BP/Weight  Systolic BP 139 -- 129  Diastolic BP 81 -- 77  Wt. (Lbs) 122.2 111 111  BMI 19.72 kg/m2 17.92 kg/m2 17.92 kg/m2      Physical Exam Constitutional:      Appearance: He is well-developed.  Cardiovascular:     Rate and Rhythm: Tachycardia present.     Heart sounds: Normal heart sounds. No murmur heard. Pulmonary:     Effort: Pulmonary effort is normal.     Breath sounds: Normal  breath sounds. No wheezing or rales.  Chest:     Chest wall: No tenderness.  Abdominal:     General: Bowel sounds are normal. There is no distension.     Palpations: Abdomen is soft. There is no mass.     Tenderness: There is no abdominal tenderness.  Musculoskeletal:        General: Normal range of motion.     Right lower leg: No edema.     Left lower leg: No edema.  Neurological:     Mental Status: He is alert and oriented to person, place, and time.  Psychiatric:        Mood and Affect: Mood normal.        Latest Ref Rng & Units 10/12/2022    4:37 PM 12/19/2021    8:50 AM 03/26/2021   10:31 AM  CMP  Glucose 70 - 99 mg/dL 88  161  92   BUN 8 - 27 mg/dL 8  5  9    Creatinine 0.76 - 1.27 mg/dL 0.96  0.45  4.09   Sodium 134 - 144 mmol/L 139  136  137   Potassium 3.5 - 5.2 mmol/L 4.4  4.3  4.5   Chloride 96 - 106 mmol/L 103  101  100   CO2 20 - 29 mmol/L 22  23  22    Calcium 8.6 - 10.2 mg/dL 9.9  9.8  9.9   Total Protein 6.0 - 8.5 g/dL 7.2  7.4    Total Bilirubin 0.0 - 1.2 mg/dL <8.1  0.4    Alkaline Phos 44 - 121 IU/L 54  56    AST 0 - 40 IU/L 29  45    ALT 0 - 44 IU/L 11  25      Lipid Panel     Component Value Date/Time   CHOL 156 12/19/2021 0850   TRIG 35 12/19/2021 0850  HDL 100 12/19/2021 0850   CHOLHDL 2.2 03/18/2017 0834   CHOLHDL 2.2 01/05/2014 1620   VLDL 12 01/05/2014 1620   LDLCALC 47 12/19/2021 0850    CBC    Component Value Date/Time   WBC 10.3 08/12/2020 0759   RBC 5.02 08/12/2020 0759   HGB 14.9 08/12/2020 0759   HGB 13.5 06/12/2019 1156   HGB 13.0 05/16/2018 1219   HCT 44.7 08/12/2020 0759   HCT 39.1 05/16/2018 1219   PLT 301 08/12/2020 0759   PLT 266 06/12/2019 1156   PLT 199 05/16/2018 1219   MCV 89.0 08/12/2020 0759   MCV 91 05/16/2018 1219   MCH 29.7 08/12/2020 0759   MCHC 33.3 08/12/2020 0759   RDW 11.6 08/12/2020 0759   RDW 12.1 (L) 05/16/2018 1219   LYMPHSABS 2.3 06/12/2019 1156   LYMPHSABS 1.0 05/16/2018 1219   MONOABS 1.0  06/12/2019 1156   EOSABS 0.1 06/12/2019 1156   EOSABS 0.0 05/16/2018 1219   BASOSABS 0.0 06/12/2019 1156   BASOSABS 0.1 05/16/2018 1219    Lab Results  Component Value Date   HGBA1C 5.0 10/07/2017    The 10-year ASCVD risk score (Arnett DK, et al., 2019) is: 27.4%   Values used to calculate the score:     Age: 16 years     Sex: Male     Is Non-Hispanic African American: Yes     Diabetic: No     Tobacco smoker: Yes     Systolic Blood Pressure: 139 mmHg     Is BP treated: Yes     HDL Cholesterol: 100 mg/dL     Total Cholesterol: 156 mg/dL     Assessment & Plan Hiccups Chronic hiccups , unresponsive to previous treatments. No identifiable cause. Considering retrying chlorpromazine. Helicobacter pylori test planned. - Prescribe chlorpromazine. - Order Helicobacter pylori test. - If Helicobacter pylori test is positive, treat with antibiotics.  Tobacco use disorder Continues smoking half a pack daily. Bupropion not tolerated. Open to nicotine patches. - Prescribe nicotine patches.  Hypertension Taking amlodipine regularly. No adherence issues. Refill needed. - Refill amlodipine prescription.  Hyperlipidemia Uncertain about atorvastatin use. Continue ASCVD risk of 27.4%.  - Reinforced importance for cholesterol management. Refill needed. - Refill atorvastatin prescription.   History of hepatocellular carcinoma -Status post IR transcatheter chemoembolization of the liver mass   Colonoscopy follow-up Colonoscopy scheduled in April. - Ensure attendance at scheduled colonoscopy in April.      No orders of the defined types were placed in this encounter.   Follow-up: No follow-ups on file.       Hoy Register, MD, FAAFP. [facility] [place], [place] [phone]   10/12/2023, 1:43 PM

## 2023-10-12 NOTE — Patient Instructions (Signed)
 VISIT SUMMARY:  During today's visit, we discussed your persistent hiccups, smoking habits, hypertension, hyperlipidemia, and your upcoming colonoscopy. We reviewed your current medications and made some adjustments to better manage your conditions.  YOUR PLAN:  -HICCUPS: Chronic hiccups are persistent and can last for extended periods without stopping. We will retry chlorpromazine to help manage your hiccups and have ordered a test for Helicobacter pylori, a type of bacteria that can cause stomach issues. If the test is positive, we will treat it with antibiotics.  -TOBACCO USE DISORDER: Tobacco use disorder is a dependence on smoking. Since you did not tolerate Wellbutrin, we will try nicotine patches to help you quit smoking.  -HYPERTENSION: Hypertension is high blood pressure. You are taking amlodipine regularly, and we will provide a refill for this medication.  -HYPERLIPIDEMIA: Hyperlipidemia is high cholesterol. It is important to manage this condition to prevent heart disease. We will provide a refill for your atorvastatin and encourage you to take it regularly.  -COLONOSCOPY FOLLOW-UP: A colonoscopy is a procedure to examine the colon. You have a scheduled colonoscopy in April, and it is important to attend this appointment.  INSTRUCTIONS:  1. Take chlorpromazine as prescribed for your hiccups. 2. Complete the Helicobacter pylori test as ordered. 3. Use nicotine patches as directed to help quit smoking. 4. Continue taking amlodipine for hypertension and atorvastatin for hyperlipidemia as prescribed. Refills have been provided. 5. Attend your scheduled colonoscopy in April.

## 2023-10-14 ENCOUNTER — Other Ambulatory Visit: Payer: Self-pay | Admitting: Family Medicine

## 2023-10-14 ENCOUNTER — Ambulatory Visit (HOSPITAL_COMMUNITY)
Admission: RE | Admit: 2023-10-14 | Discharge: 2023-10-14 | Disposition: A | Discharge status: Home / Self Care | Payer: 59 | Source: Ambulatory Visit | Attending: Interventional Radiology | Admitting: Interventional Radiology

## 2023-10-14 ENCOUNTER — Other Ambulatory Visit: Payer: Self-pay

## 2023-10-14 DIAGNOSIS — Z9049 Acquired absence of other specified parts of digestive tract: Secondary | ICD-10-CM | POA: Diagnosis not present

## 2023-10-14 DIAGNOSIS — K769 Liver disease, unspecified: Secondary | ICD-10-CM | POA: Diagnosis not present

## 2023-10-14 DIAGNOSIS — A048 Other specified bacterial intestinal infections: Secondary | ICD-10-CM | POA: Insufficient documentation

## 2023-10-14 DIAGNOSIS — C220 Liver cell carcinoma: Secondary | ICD-10-CM | POA: Insufficient documentation

## 2023-10-14 DIAGNOSIS — N281 Cyst of kidney, acquired: Secondary | ICD-10-CM | POA: Diagnosis not present

## 2023-10-14 LAB — CMP14+EGFR
ALT: 13 IU/L (ref 0–44)
AST: 35 IU/L (ref 0–40)
Albumin: 4.7 g/dL (ref 3.9–4.9)
Alkaline Phosphatase: 58 IU/L (ref 44–121)
BUN/Creatinine Ratio: 9 — ABNORMAL LOW (ref 10–24)
BUN: 7 mg/dL — ABNORMAL LOW (ref 8–27)
Bilirubin Total: 0.6 mg/dL (ref 0.0–1.2)
CO2: 22 mmol/L (ref 20–29)
Calcium: 10.5 mg/dL — ABNORMAL HIGH (ref 8.6–10.2)
Chloride: 100 mmol/L (ref 96–106)
Creatinine, Ser: 0.75 mg/dL — ABNORMAL LOW (ref 0.76–1.27)
Globulin, Total: 2.8 g/dL (ref 1.5–4.5)
Glucose: 102 mg/dL — ABNORMAL HIGH (ref 70–99)
Potassium: 4.4 mmol/L (ref 3.5–5.2)
Sodium: 137 mmol/L (ref 134–144)
Total Protein: 7.5 g/dL (ref 6.0–8.5)
eGFR: 97 mL/min/{1.73_m2} (ref >59)

## 2023-10-14 LAB — H. PYLORI BREATH TEST

## 2023-10-14 LAB — H. PYLORI BREATH COLLECTION

## 2023-10-14 MED ORDER — GADOBUTROL 1 MMOL/ML IV SOLN
5.0000 mL | Freq: Once | INTRAVENOUS | Status: AC | PRN
Start: 2023-10-14 — End: 2023-10-14
  Administered 2023-10-14: 5 mL via INTRAVENOUS

## 2023-10-14 MED ORDER — CLARITHROMYCIN 500 MG PO TABS
500.0000 mg | ORAL_TABLET | Freq: Two times a day (BID) | ORAL | 0 refills | Status: DC
  Filled 2023-10-14: qty 28, 14d supply, fill #0

## 2023-10-14 MED ORDER — AMOXICILLIN 500 MG PO CAPS
1000.0000 mg | ORAL_CAPSULE | Freq: Two times a day (BID) | ORAL | 0 refills | Status: DC
  Filled 2023-10-14: qty 56, 14d supply, fill #0

## 2023-10-14 MED ORDER — OMEPRAZOLE 20 MG PO CPDR
20.0000 mg | DELAYED_RELEASE_CAPSULE | Freq: Two times a day (BID) | ORAL | 0 refills | Status: DC
  Filled 2023-10-14: qty 28, 14d supply, fill #0

## 2023-10-15 ENCOUNTER — Other Ambulatory Visit: Payer: Self-pay

## 2023-10-18 ENCOUNTER — Other Ambulatory Visit: Payer: Self-pay

## 2023-10-18 ENCOUNTER — Ambulatory Visit (AMBULATORY_SURGERY_CENTER): Payer: 59

## 2023-10-18 VITALS — Ht 66.0 in | Wt 122.0 lb

## 2023-10-18 DIAGNOSIS — Z1211 Encounter for screening for malignant neoplasm of colon: Secondary | ICD-10-CM

## 2023-10-18 MED ORDER — NA SULFATE-K SULFATE-MG SULF 17.5-3.13-1.6 GM/177ML PO SOLN
1.0000 | Freq: Once | ORAL | 0 refills | Status: AC
  Filled 2023-10-18: qty 354, 2d supply, fill #0

## 2023-10-18 NOTE — Progress Notes (Signed)
 Denies allergies to eggs or soy products. Denies complication of anesthesia or sedation. Denies use of weight loss medication. Denies use of O2.   Emmi instructions given for colonoscopy.

## 2023-10-20 ENCOUNTER — Other Ambulatory Visit: Payer: Self-pay

## 2023-10-26 ENCOUNTER — Other Ambulatory Visit: Payer: Self-pay

## 2023-10-28 ENCOUNTER — Ambulatory Visit
Admission: RE | Admit: 2023-10-28 | Discharge: 2023-10-28 | Disposition: A | Discharge status: Home / Self Care | Payer: 59 | Source: Ambulatory Visit | Attending: Interventional Radiology | Admitting: Interventional Radiology

## 2023-10-28 DIAGNOSIS — C220 Liver cell carcinoma: Secondary | ICD-10-CM | POA: Diagnosis not present

## 2023-10-28 HISTORY — PX: IR RADIOLOGIST EVAL & MGMT: IMG5224

## 2023-10-28 NOTE — Progress Notes (Signed)
 Chief Complaint: Patient was consulted remotely today (TeleHealth) for hepatocelular cancer at the request of Bereket Gernert K.    Referring Physician(s): Kanav Kazmierczak K  History of Present Illness: Calvin Gardner is a 73 y.o. male with a history of HCV and EtOH cirrhosis complicated by development of a large 7.4 cm partially exophytic mass arising from the inferior aspect of hepatic segment 6.  MR imaging demonstrates LI-RADS 5 imaging features diagnostic of hepatocellular carcinoma.     He was deemed not to be a surgical candidate and therefore underwent liver directed therapy with drug-eluting bead chemoembolization on 07/19/2018.     Surveillance MRI imaging was performed on 10/12/2019.  Excellent continued response to therapy with further involution of the treated segment 6 mass.  There has been a nearly 50% reduction in lesion volume.     MRI imaging dated 01/26/2020 demonstrates a further reduction in volume of the previously treated lesion.  No evidence of residual or recurrent disease.  A small 6 mm focus of subcapsular enhancement is present peripherally and may represent a benign vascular phenomenon or regenerating nodule.   MRI dated 07/31/20:  No significant change in right hepatic lobe mass status post chemoembolization. Nodular soft tissue enhancement along its medial wall remains stable, but is suspicious for residual carcinoma. Recommend continued follow-up by MRI in 6 months.   MRI 02/06/2021: No change in volume of RIGHT hepatic lobe hepatocellular carcinoma following chemoembolization.  Persistent enhancement along the medial border of the lesion concerning for residual albeit nonprogressive carcinoma.   MRI 02/06/2022: Decreased size of the embolization defect within segments 5/6.  Similar appearance of apparent nodular enhancement along the medial aspect of the embolization site. Cannot exclude stable residual disease. Alternatively, given ongoing size stability,  this apparent enhancement could relate to an adjacent diverticulum within the hepatic flexure of the colon.   MRI 07/29/22: Expected evolution of remote ablation site centered about segments 5 and 6. The previously described enhancement along the medial portion of the ablation site is decreased, favored to be secondary to an adjacent colonic diverticulum. No new liver lesion and no evidence of abdominal metastatic disease.    MRI 03/09/23:  Unchanged nonenhancing post treated lesion of the inferior right lobe of the liver, hepatic segment V/VI measuring 2.2 x 1.9 cm. LI-RADS TR nonviable.  MRI 10/14/23: Not yet officially read.  I independently reviewed the images and detect no evidence of residual or recurrent disease.  No new disease visualized.   Calvin Gardner continues to do very well overall.  He has no active complaints or issues at this time.  Past Medical History:  Diagnosis Date   Alcoholism (HCC)    Aortic atherosclerosis (HCC)    Arthritis    Cancer (HCC)    liver   Cataract    GERD (gastroesophageal reflux disease)    Hepatitis C    HLD (hyperlipidemia)    Hypertension    Liver mass    Pulmonary nodule    Substance abuse (HCC)     Past Surgical History:  Procedure Laterality Date   COLONOSCOPY WITH PROPOFOL N/A 01/30/2014   Procedure: COLONOSCOPY WITH PROPOFOL;  Surgeon: Charolett Bumpers, MD;  Location: [facility];  Service: Endoscopy;  Laterality: N/A;   ESOPHAGOGASTRODUODENOSCOPY (EGD) WITH PROPOFOL N/A 01/30/2014   Procedure: ESOPHAGOGASTRODUODENOSCOPY (EGD) WITH PROPOFOL;  Surgeon: Charolett Bumpers, MD;  Location: [facility];  Service: Endoscopy;  Laterality: N/A;   IR ANGIOGRAM SELECTIVE EACH ADDITIONAL VESSEL  07/19/2018   IR  ANGIOGRAM SELECTIVE EACH ADDITIONAL VESSEL  07/19/2018   IR ANGIOGRAM SELECTIVE EACH ADDITIONAL VESSEL  07/19/2018   IR ANGIOGRAM SELECTIVE EACH ADDITIONAL VESSEL  07/19/2018   IR ANGIOGRAM SELECTIVE EACH ADDITIONAL VESSEL  07/19/2018    IR ANGIOGRAM VISCERAL SELECTIVE  07/19/2018   IR EMBO TUMOR ORGAN ISCHEMIA INFARCT INC GUIDE ROADMAPPING  07/19/2018   IR RADIOLOGIST EVAL & MGMT  07/07/2018   IR RADIOLOGIST EVAL & MGMT  08/16/2018   IR RADIOLOGIST EVAL & MGMT  01/31/2019   IR RADIOLOGIST EVAL & MGMT  04/25/2019   IR RADIOLOGIST EVAL & MGMT  07/20/2019   IR RADIOLOGIST EVAL & MGMT  10/17/2019   IR RADIOLOGIST EVAL & MGMT  01/30/2020   IR RADIOLOGIST EVAL & MGMT  08/14/2020   IR RADIOLOGIST EVAL & MGMT  02/24/2022   IR RADIOLOGIST EVAL & MGMT  08/11/2022   IR RADIOLOGIST EVAL & MGMT  04/27/2023   IR RADIOLOGIST EVAL & MGMT  10/28/2023   IR US GUIDE VASC ACCESS LEFT  07/19/2018   left jaw surgery      MANDIBLE FRACTURE SURGERY      Allergies: Patient has no known allergies.  Medications: Prior to Admission medications   Medication Sig Start Date End Date Taking? Authorizing Provider  amLODipine (NORVASC) 5 MG tablet Take 1 tablet (5 mg total) by mouth daily. 10/12/23   Hoy Register, MD  amoxicillin (AMOXIL) 500 MG capsule Take 2 capsules (1,000 mg total) by mouth 2 (two) times daily. 10/14/23   Hoy Register, MD  aspirin EC 81 MG tablet Take 81 mg by mouth every morning. Reported on 12/19/2015    [provider]  atorvastatin (LIPITOR) 20 MG tablet Take 1 tablet (20 mg total) by mouth daily. Patient not taking: Reported on 10/18/2023 10/12/23   Hoy Register, MD  chlorproMAZINE (THORAZINE) 10 MG tablet Take 1 tablet (10 mg total) by mouth 3 (three) times daily as needed. For hiccups 10/12/23   Hoy Register, MD  clarithromycin (BIAXIN) 500 MG tablet Take 1 tablet (500 mg total) by mouth 2 (two) times daily. 10/14/23   Hoy Register, MD  nicotine (NICODERM CQ) 14 mg/24hr patch Place 1 patch (14 mg total) onto the skin daily. Then decrease to 7mg /24hr x4 weeks Patient not taking: Reported on 10/18/2023 10/12/23   Hoy Register, MD  omeprazole (PRILOSEC) 20 MG capsule Take 1 capsule (20 mg total) by mouth 2 (two) times  daily before a meal. 10/14/23   Hoy Register, MD     Family History  Problem Relation Age of Onset   Cancer Mother        type unknown   Emphysema Brother    Colon cancer Neg Hx    Esophageal cancer Neg Hx    Pancreatic cancer Neg Hx    Rectal cancer Neg Hx    Stomach cancer Neg Hx     Social History   Socioeconomic History   Marital status: Single    Spouse name: Not on file   Number of children: 1   Years of education: Not on file   Highest education level: Not on file  Occupational History   Occupation: works in Quarry manager    Occupation: Retired  Tobacco Use   Smoking status: Some Days    Current packs/day: 1.00    Average packs/day: 1 pack/day for 50.0 years (50.0 ttl pk-yrs)    Types: Cigarettes   Smokeless tobacco: Never   Tobacco comments:    trying to quit, smoke every  other day  Vaping Use   Vaping status: Never Used  Substance and Sexual Activity   Alcohol use: Yes    Alcohol/week: 0.0 standard drinks of alcohol    Comment: 2-3 40 oz beers per day, x50 years   Drug use: No    Comment: remote IV drug use age 53    Sexual activity: Yes    Partners: Female, Male    Comment: patient declines  Other Topics Concern   Not on file  Social History Narrative   Not on file   Social Drivers of Health   Financial Resource Strain: Low Risk  (04/27/2023)   Overall Financial Resource Strain (CARDIA)    Difficulty of Paying Living Expenses: Not hard at all  Food Insecurity: No Food Insecurity (04/27/2023)   Hunger Vital Sign    Worried About Running Out of Food in the Last Year: Never true    Ran Out of Food in the Last Year: Never true  Transportation Needs: No Transportation Needs (04/27/2023)   PRAPARE - Administrator, Civil Service (Medical): No    Lack of Transportation (Non-Medical): No  Physical Activity: Sufficiently Active (04/27/2023)   Exercise Vital Sign    Days of Exercise per Week: 7 days    Minutes of Exercise per Session: 30 min   Stress: No Stress Concern Present (04/27/2023)   [employer] of Occupational Health - Occupational Stress Questionnaire    Feeling of Stress : Not at all  Social Connections: Socially Isolated (04/27/2023)   Social Connection and Isolation Panel [NHANES]    Frequency of Communication with Friends and Family: Three times a week    Frequency of Social Gatherings with Friends and Family: Never    Attends Religious Services: Never    Database administrator or Organizations: No    Attends Engineer, structural: Never    Marital Status: Divorced    ECOG Status: 0 - Asymptomatic  Review of Systems  Review of Systems: A 12 point ROS discussed and pertinent positives are indicated in the HPI above.  All other systems are negative.  Advance Care Plan: The advanced care plan/surrogate decision maker was discussed at the time of visit and the patient did not wish to discuss or was not able to name a surrogate decision maker or provide an advance care plan.    Physical Exam No direct physical exam was performed (except for noted visual exam findings with Video Visits).    Vital Signs: There were no vitals taken for this visit.  Imaging: IR Radiologist Eval & Mgmt Result Date: 10/28/2023 EXAM: NEW PATIENT OFFICE VISIT CHIEF COMPLAINT: SEE NOTE IN EPIC HISTORY OF PRESENT ILLNESS: SEE NOTE IN EPIC REVIEW OF SYSTEMS: SEE NOTE IN EPIC PHYSICAL EXAMINATION: SEE NOTE IN EPIC ASSESSMENT AND PLAN: SEE NOTE IN EPIC Electronically Signed   By: Malachy Moan M.D.   On: 10/28/2023 10:08    Labs:  CBC: No results for input(s): "WBC", "HGB", "HCT", "PLT" in the last 8760 hours.  COAGS: No results for input(s): "INR", "APTT" in the last 8760 hours.  BMP: Recent Labs    10/12/23 1405  NA 137  K 4.4  CL 100  CO2 22  GLUCOSE 102*  BUN 7*  CALCIUM 10.5*  CREATININE 0.75*    LIVER FUNCTION TESTS: Recent Labs    10/12/23 1405  BILITOT 0.6  AST 35  ALT 13  ALKPHOS 58   PROT 7.5  ALBUMIN 4.7  TUMOR MARKERS: No results for input(s): "AFPTM", "CEA", "CA199", "CHROMGRNA" in the last 8760 hours.  Assessment and Plan:  73 year-old male continuing to do well now 5 years and 4 months status post drug-eluting bead chemoembolization of 6.7 cm segment 6 hepatocellular carcinoma.  Continued complete response to therapy.   1.)  Next MRI abdomen with gadolinium contrast and accompanying clinic visit in 6 months. (September 2025).     Electronically Signed: Sterling Big 10/28/2023, 11:14 AM   I spent a total of 10 Minutes in remote  clinical consultation, greater than 50% of which was counseling/coordinating care for hepatocellular cancer.    Visit type: Audio only (telephone). Audio (no video) only due to patient's lack of internet/smartphone capability. Alternative for in-person consultation at [facility], [address] [place], [place], [place]. This visit type was conducted due to national recommendations for restrictions regarding the COVID-19 Pandemic (e.g. social distancing).  This format is felt to be most appropriate for this patient at this time.  All issues noted in this document were discussed and addressed.

## 2023-11-16 ENCOUNTER — Ambulatory Visit (AMBULATORY_SURGERY_CENTER): Payer: 59 | Admitting: Gastroenterology

## 2023-11-16 ENCOUNTER — Encounter: Payer: Self-pay | Admitting: Gastroenterology

## 2023-11-16 VITALS — BP 120/82 | HR 69 | Temp 97.3°F | Resp 13 | Ht 66.0 in

## 2023-11-16 DIAGNOSIS — Z1211 Encounter for screening for malignant neoplasm of colon: Secondary | ICD-10-CM

## 2023-11-16 DIAGNOSIS — Z860100 Personal history of colon polyps, unspecified: Secondary | ICD-10-CM

## 2023-11-16 DIAGNOSIS — K5730 Diverticulosis of large intestine without perforation or abscess without bleeding: Secondary | ICD-10-CM

## 2023-11-16 DIAGNOSIS — Z860101 Personal history of adenomatous and serrated colon polyps: Secondary | ICD-10-CM | POA: Diagnosis not present

## 2023-11-16 MED ORDER — FLEET ENEMA RE ENEM
1.0000 | ENEMA | Freq: Once | RECTAL | Status: AC
  Administered 2023-11-16: 1 via RECTAL

## 2023-11-16 MED ORDER — SODIUM CHLORIDE 0.9 % IV SOLN: 500.0000 mL | Freq: Once | INTRAVENOUS | Status: DC

## 2023-11-16 NOTE — Progress Notes (Signed)
 Pt's states no medical or surgical changes since previsit or office visit.

## 2023-11-16 NOTE — Progress Notes (Signed)
 History and Physical:  This patient presents for endoscopic testing for: Encounter Diagnosis  Name Primary?   Special screening for malignant neoplasms, colon Yes    73 yo man with Hx of HCV and  HCC here for screening colonoscopy.   Last done June 2015 with Dr Danise Edge - no report available. Patient took prep improperly, consuming it all 72 hrs ago  Patient is otherwise without complaints or active issues today.   Past Medical History: Past Medical History:  Diagnosis Date   Alcoholism (HCC)    Aortic atherosclerosis (HCC)    Arthritis    Cancer (HCC)    liver   Cataract    GERD (gastroesophageal reflux disease)    Hepatitis C    HLD (hyperlipidemia)    Hypertension    Liver mass    Pulmonary nodule    Substance abuse (HCC)      Past Surgical History: Past Surgical History:  Procedure Laterality Date   COLONOSCOPY WITH PROPOFOL N/A 01/30/2014   Procedure: COLONOSCOPY WITH PROPOFOL;  Surgeon: Charolett Bumpers, MD;  Location: [facility];  Service: Endoscopy;  Laterality: N/A;   ESOPHAGOGASTRODUODENOSCOPY (EGD) WITH PROPOFOL N/A 01/30/2014   Procedure: ESOPHAGOGASTRODUODENOSCOPY (EGD) WITH PROPOFOL;  Surgeon: Charolett Bumpers, MD;  Location: [facility];  Service: Endoscopy;  Laterality: N/A;   IR ANGIOGRAM SELECTIVE EACH ADDITIONAL VESSEL  07/19/2018   IR ANGIOGRAM SELECTIVE EACH ADDITIONAL VESSEL  07/19/2018   IR ANGIOGRAM SELECTIVE EACH ADDITIONAL VESSEL  07/19/2018   IR ANGIOGRAM SELECTIVE EACH ADDITIONAL VESSEL  07/19/2018   IR ANGIOGRAM SELECTIVE EACH ADDITIONAL VESSEL  07/19/2018   IR ANGIOGRAM VISCERAL SELECTIVE  07/19/2018   IR EMBO TUMOR ORGAN ISCHEMIA INFARCT INC GUIDE ROADMAPPING  07/19/2018   IR RADIOLOGIST EVAL & MGMT  07/07/2018   IR RADIOLOGIST EVAL & MGMT  08/16/2018   IR RADIOLOGIST EVAL & MGMT  01/31/2019   IR RADIOLOGIST EVAL & MGMT  04/25/2019   IR RADIOLOGIST EVAL & MGMT  07/20/2019   IR RADIOLOGIST EVAL & MGMT  10/17/2019   IR RADIOLOGIST  EVAL & MGMT  01/30/2020   IR RADIOLOGIST EVAL & MGMT  08/14/2020   IR RADIOLOGIST EVAL & MGMT  02/24/2022   IR RADIOLOGIST EVAL & MGMT  08/11/2022   IR RADIOLOGIST EVAL & MGMT  04/27/2023   IR RADIOLOGIST EVAL & MGMT  10/28/2023   IR US GUIDE VASC ACCESS LEFT  07/19/2018   left jaw surgery      MANDIBLE FRACTURE SURGERY      Allergies: No Known Allergies  Outpatient Meds: Current Outpatient Medications  Medication Sig Dispense Refill   amLODipine (NORVASC) 5 MG tablet Take 1 tablet (5 mg total) by mouth daily. 90 tablet 1   aspirin EC 81 MG tablet Take 81 mg by mouth every morning. Reported on 12/19/2015     atorvastatin (LIPITOR) 20 MG tablet Take 1 tablet (20 mg total) by mouth daily. 90 tablet 1   chlorproMAZINE (THORAZINE) 10 MG tablet Take 1 tablet (10 mg total) by mouth 3 (three) times daily as needed. For hiccups 90 tablet 1   clarithromycin (BIAXIN) 500 MG tablet Take 1 tablet (500 mg total) by mouth 2 (two) times daily. 28 tablet 0   amoxicillin (AMOXIL) 500 MG capsule Take 2 capsules (1,000 mg total) by mouth 2 (two) times daily. 56 capsule 0   nicotine (NICODERM CQ) 14 mg/24hr patch Place 1 patch (14 mg total) onto the skin daily. Then decrease to 7mg /24hr x4 weeks (Patient  not taking: Reported on 10/18/2023) 28 patch 1   omeprazole (PRILOSEC) 20 MG capsule Take 1 capsule (20 mg total) by mouth 2 (two) times daily before a meal. 28 capsule 0   Current Facility-Administered Medications  Medication Dose Route Frequency Provider Last Rate Last Admin   0.9 %  sodium chloride infusion  500 mL Intravenous Once Danis, Danen Lapaglia L III, MD          ___________________________________________________________________ Objective   Exam:  BP 121/76   Pulse 67   Temp (!) 97.3 F (36.3 C)   Resp 13   Ht 5\' 6"  (1.676 m)   SpO2 100%   BMI 19.69 kg/m   CV: regular , S1/S2 Resp: clear to auscultation bilaterally, normal RR and effort noted GI: soft, no tenderness, with active bowel  sounds.   Assessment: Encounter Diagnosis  Name Primary?   Special screening for malignant neoplasms, colon Yes     Plan: Colonoscopy   The benefits and risks of the planned procedure(s) were described in detail with the patient or (when appropriate) their health care proxy.  Risks were outlined as including, but not limited to, bleeding, infection, perforation, adverse medication reaction leading to cardiac or pulmonary decompensation, pancreatitis (if ERCP).  The limitation of incomplete mucosal visualization was also discussed.  No guarantees or warranties were given.  The patient is appropriate for an endoscopic procedure in the ambulatory setting.   - Lorella Roles, MD

## 2023-11-16 NOTE — Op Note (Signed)
 [facility] Patient Name: Calvin Gardner Procedure Date: 11/16/2023 10:49 AM MRN: [id number] Endoscopist: Sherilyn Cooter L. Myrtie Neither , MD, [id number] Age: 73 Referring MD:  Date of Birth: January 10, 1951 Gender: Male Account #: [id number] Procedure:                Colonoscopy Indications:              Surveillance: Personal history of adenomatous                            polyps on last colonoscopy > 5 years ago,                           last colonoscopy June 2015 (Dr. Danise Edge) - 2                            adenomatous polyps ( reports found in Epic EMR                            under "op notes" and pathology section) Medicines:                Monitored Anesthesia Care Procedure:                Pre-Anesthesia Assessment:                           - Prior to the procedure, a History and Physical                            was performed, and patient medications and                            allergies were reviewed. The patient's tolerance of                            previous anesthesia was also reviewed. The risks                            and benefits of the procedure and the sedation                            options and risks were discussed with the patient.                            All questions were answered, and informed consent                            was obtained. Prior Anticoagulants: The patient has                            taken no anticoagulant or antiplatelet agents. ASA                            Grade Assessment: III - A patient with severe  systemic disease. After reviewing the risks and                            benefits, the patient was deemed in satisfactory                            condition to undergo the procedure.                           After obtaining informed consent, the colonoscope                            was passed under direct vision. Throughout the                            procedure, the patient's blood  pressure, pulse, and                            oxygen saturations were monitored continuously. The                            PCF-H190TL Slim SN [id number] was introduced through                            the anus and advanced to the the cecum, identified                            by its appearance. The Olympus Scope SN: [id number]                            was introduced through the and advanced to the. The                            colonoscopy was performed with difficulty due to                            multiple diverticula in the colon, poor bowel prep,                            a redundant colon and a tortuous colon. The patient                            tolerated the procedure well. The quality of the                            bowel preparation was poor. The ileocecal valve was                            photographed.                           THIS PATIENT TOOK HIS BOWEL PREP 72 HOURS BEFORE  THE SCHEDULED TIME AND REMAINED ON LIQUIDS                            AFTERWARD. pre-procedure enema yielded little result Scope In: 11:42:31 AM Scope Out: 11:51:31 AM Scope Withdrawal Time: 0 hours 3 minutes 9 seconds  Total Procedure Duration: 0 hours 9 minutes 0 seconds  Findings:                 The perianal and digital rectal examinations were                            normal.                           Copious quantities of semi-solid stool was found in                            the entire colon, precluding visualization. This                            procedure was aborted after reaching the cecum. No                            reasonable mucosal visualization could be achieved.                           Diverticula were found in the left colon. Complications:            No immediate complications. Estimated Blood Loss:     Estimated blood loss: none. Impression:               - Preparation of the colon was poor.                           - Stool in the  entire examined colon.                           - No specimens collected. Recommendation:           - Resume previous diet.                           - Continue present medications.                           - Patient will be offered one chance to rechedule                            this procedure with a PEG prep and proper following                            of prep instructions. Manan Olmo L. Myrtie Neither, MD 11/16/2023 12:00:29 PM This report has been signed electronically.

## 2023-11-16 NOTE — Patient Instructions (Signed)
 Discharge instructions given. Handout on Diverticulosis. Repeat procedure scheduled. Resume previous medications. YOU HAD AN ENDOSCOPIC PROCEDURE TODAY AT THE [facility]:   Refer to the procedure report that was given to you for any specific questions about what was found during the examination.  If the procedure report does not answer your questions, please call your gastroenterologist to clarify.  If you requested that your care partner not be given the details of your procedure findings, then the procedure report has been included in a sealed envelope for you to review at your convenience later.  YOU SHOULD EXPECT: Some feelings of bloating in the abdomen. Passage of more gas than usual.  Walking can help get rid of the air that was put into your GI tract during the procedure and reduce the bloating. If you had a lower endoscopy (such as a colonoscopy or flexible sigmoidoscopy) you may notice spotting of blood in your stool or on the toilet paper. If you underwent a bowel prep for your procedure, you may not have a normal bowel movement for a few days.  Please Note:  You might notice some irritation and congestion in your nose or some drainage.  This is from the oxygen used during your procedure.  There is no need for concern and it should clear up in a day or so.  SYMPTOMS TO REPORT IMMEDIATELY:  Following lower endoscopy (colonoscopy or flexible sigmoidoscopy):  Excessive amounts of blood in the stool  Significant tenderness or worsening of abdominal pains  Swelling of the abdomen that is new, acute  Fever of 100F or higher   For urgent or emergent issues, a gastroenterologist can be reached at any hour by calling [phone]. Do not use MyChart messaging for urgent concerns.    DIET:  We do recommend a small meal at first, but then you may proceed to your regular diet.  Drink plenty of fluids but you should avoid alcoholic beverages for 24 hours.  ACTIVITY:  You  should plan to take it easy for the rest of today and you should NOT DRIVE or use heavy machinery until tomorrow (because of the sedation medicines used during the test).    FOLLOW UP: Our staff will call the number listed on your records the next business day following your procedure.  We will call around 7:15- 8:00 am to check on you and address any questions or concerns that you may have regarding the information given to you following your procedure. If we do not reach you, we will leave a message.     If any biopsies were taken you will be contacted by phone or by letter within the next 1-3 weeks.  Please call us  at [phone] if you have not heard about the biopsies in 3 weeks.    SIGNATURES/CONFIDENTIALITY: You and/or your care partner have signed paperwork which will be entered into your electronic medical record.  These signatures attest to the fact that that the information above on your After Visit Summary has been reviewed and is understood.  Full responsibility of the confidentiality of this discharge information lies with you and/or your care-partner.

## 2023-11-16 NOTE — Progress Notes (Signed)
 Vss nad trans to pacu

## 2023-11-17 ENCOUNTER — Telehealth: Payer: Self-pay

## 2023-11-17 NOTE — Telephone Encounter (Signed)
  Follow up Call-     11/16/2023   10:45 AM  Call back number  Post procedure Call Back phone  # [phone]  Permission to leave phone message Yes     Patient questions:  Do you have a fever, pain , or abdominal swelling? No. Pain Score  0 *  Have you tolerated food without any problems? Yes.    Have you been able to return to your normal activities? Yes.    Do you have any questions about your discharge instructions: Diet   No. Medications  No. Follow up visit  No.  Do you have questions or concerns about your Care? No.  Actions: * If pain score is 4 or above: No action needed, pain <4.

## 2023-12-15 ENCOUNTER — Other Ambulatory Visit: Payer: Self-pay

## 2023-12-15 ENCOUNTER — Ambulatory Visit (AMBULATORY_SURGERY_CENTER)

## 2023-12-15 VITALS — Ht 66.0 in | Wt 113.0 lb

## 2023-12-15 DIAGNOSIS — Z1211 Encounter for screening for malignant neoplasm of colon: Secondary | ICD-10-CM

## 2023-12-15 MED ORDER — NA SULFATE-K SULFATE-MG SULF 17.5-3.13-1.6 GM/177ML PO SOLN
1.0000 | Freq: Once | ORAL | 0 refills | Status: AC
  Filled 2023-12-15: qty 354, 1d supply, fill #0

## 2023-12-15 NOTE — Progress Notes (Signed)
 Pre visit completed via phone call; Patient verified name, DOB, and address; No egg or soy allergy known to patient  No issues known to pt with past sedation with any surgeries or procedures Patient denies ever being told they had issues or difficulty with intubation  No FH of Malignant Hyperthermia Pt is not on diet pills Pt is not on home 02  Pt is not on blood thinners  Pt denies issues with constipation;  No A fib or A flutter; Have any cardiac testing pending--NO Insurance verified during PV appt--- [facility] Medicare/Medicaid Pt can ambulate without assistance;  Pt denies use of chewing tobacco; Discussed diabetic/weight loss medication holds; Discussed NSAID holds; Checked BMI to be less than 50; Pt instructed to use [employer] or GoodRx for a price reduction on prep;  Patient's chart reviewed by Rogena Class CNRA prior to previsit and patient appropriate for the [facility];  Pre visit completed and red dot placed by patient's name on their procedure day (on provider's schedule);    Instructions printed and placed with receptionist on 2nd floor for patient to pick up today( patient reports he is going to take the bus to the office today and pick up packet);

## 2023-12-16 ENCOUNTER — Other Ambulatory Visit: Payer: Self-pay

## 2024-01-05 ENCOUNTER — Encounter: Payer: Self-pay | Admitting: Gastroenterology

## 2024-01-05 ENCOUNTER — Ambulatory Visit: Admitting: Gastroenterology

## 2024-01-05 VITALS — BP 112/64 | HR 72 | Temp 97.8°F | Resp 16 | Ht 66.0 in | Wt 113.0 lb

## 2024-01-05 DIAGNOSIS — D124 Benign neoplasm of descending colon: Secondary | ICD-10-CM | POA: Diagnosis not present

## 2024-01-05 DIAGNOSIS — K5730 Diverticulosis of large intestine without perforation or abscess without bleeding: Secondary | ICD-10-CM

## 2024-01-05 DIAGNOSIS — I10 Essential (primary) hypertension: Secondary | ICD-10-CM | POA: Diagnosis not present

## 2024-01-05 DIAGNOSIS — Z1211 Encounter for screening for malignant neoplasm of colon: Secondary | ICD-10-CM | POA: Diagnosis not present

## 2024-01-05 DIAGNOSIS — D122 Benign neoplasm of ascending colon: Secondary | ICD-10-CM

## 2024-01-05 DIAGNOSIS — E785 Hyperlipidemia, unspecified: Secondary | ICD-10-CM | POA: Diagnosis not present

## 2024-01-05 DIAGNOSIS — K648 Other hemorrhoids: Secondary | ICD-10-CM

## 2024-01-05 MED ORDER — SODIUM CHLORIDE 0.9 % IV SOLN: 500.0000 mL | Freq: Once | INTRAVENOUS | Status: DC

## 2024-01-05 NOTE — Progress Notes (Signed)
 A/o x 3, VSS, gd SR's, pleased with anesthesia, report to RN

## 2024-01-05 NOTE — Op Note (Signed)
 [facility] Patient Name: Calvin Gardner Procedure Date: 01/05/2024 1:29 PM MRN: [id number] Endoscopist: Ace Abu L. Dominic Friendly , MD, [id number] Age: 73 Referring MD:  Date of Birth: 18-Nov-1950 Gender: Male Account #: [id number] Procedure:                Colonoscopy Indications:              Screening for colorectal malignant neoplasm                           poor prep on colonoscopy April 2025 (took prep                            incorrectly) Medicines:                Monitored Anesthesia Care Procedure:                Pre-Anesthesia Assessment:                           - Prior to the procedure, a History and Physical                            was performed, and patient medications and                            allergies were reviewed. The patient's tolerance of                            previous anesthesia was also reviewed. The risks                            and benefits of the procedure and the sedation                            options and risks were discussed with the patient.                            All questions were answered, and informed consent                            was obtained. Prior Anticoagulants: The patient has                            taken no anticoagulant or antiplatelet agents. ASA                            Grade Assessment: II - A patient with mild systemic                            disease. After reviewing the risks and benefits,                            the patient was deemed in satisfactory condition to  undergo the procedure.                           After obtaining informed consent, the colonoscope                            was passed under direct vision. Throughout the                            procedure, the patient's blood pressure, pulse, and                            oxygen saturations were monitored continuously. The                            PCF-H190TL Slim SN [id number] was introduced through                             the anus and advanced to the the cecum, identified                            by appendiceal orifice and ileocecal valve. The                            colonoscopy was performed without difficulty. The                            patient tolerated the procedure well. The quality                            of the bowel preparation was excellent. The bowel                            preparation used was GoLYTELY. Scope In: 1:39:23 PM Scope Out: 1:51:39 PM Scope Withdrawal Time: 0 hours 10 minutes 24 seconds  Total Procedure Duration: 0 hours 12 minutes 16 seconds  Findings:                 The perianal and digital rectal examinations were                            normal.                           A diminutive polyp was found in the ascending                            colon. The polyp was semi-sessile. The polyp was                            removed with a cold snare. Resection and retrieval                            were complete.  Diverticula were found in the entire colon.                           A diminutive polyp was found in the descending                            colon. The polyp was sessile. The polyp was removed                            with a cold snare. Resection and retrieval were                            complete.                           Internal hemorrhoids were found. The hemorrhoids                            were medium-sized.                           The exam was otherwise without abnormality on                            direct and retroflexion views. Complications:            No immediate complications. Estimated Blood Loss:     Estimated blood loss was minimal. Impression:               - One diminutive polyp in the ascending colon,                            removed with a cold snare. Resected and retrieved.                           - Diverticulosis in the entire examined colon.                            - One diminutive polyp in the descending colon,                            removed with a cold snare. Resected and retrieved.                           - Internal hemorrhoids.                           - The examination was otherwise normal on direct                            and retroflexion views. Recommendation:           - Patient has a contact number available for                            emergencies. The signs and symptoms of potential  delayed complications were discussed with the                            patient. Return to normal activities tomorrow.                            Written discharge instructions were provided to the                            patient.                           - Resume previous diet.                           - Continue present medications.                           - Await pathology results.                           - Repeat colonoscopy may be recommended for                            surveillance. That will be determined after                            pathology results from today's exam become                            available for review. Adasha Boehme L. Dominic Friendly, MD 01/05/2024 2:01:18 PM This report has been signed electronically.

## 2024-01-05 NOTE — Progress Notes (Signed)
 Pt's states no medical or surgical changes since previsit or office visit.

## 2024-01-05 NOTE — Progress Notes (Signed)
 History and Physical:  This patient presents for endoscopic testing for: Encounter Diagnosis  Name Primary?   Colon cancer screening Yes    Average risk screening for CRC.  Poor prep on colonoscopy 11/16/23 Patient denies chronic abdominal pain, rectal bleeding, constipation or diarrhea.  Patient is otherwise without complaints or active issues today.   Past Medical History: Past Medical History:  Diagnosis Date   Alcoholism (HCC)    Aortic atherosclerosis (HCC)    Arthritis    Cancer (HCC)    liver   Cataract    GERD (gastroesophageal reflux disease)    Hepatitis C    HLD (hyperlipidemia)    Hypertension    Liver mass    Pulmonary nodule    Substance abuse (HCC)      Past Surgical History: Past Surgical History:  Procedure Laterality Date   COLONOSCOPY WITH PROPOFOL  N/A 01/30/2014   Procedure: COLONOSCOPY WITH PROPOFOL ;  Surgeon: Garrett Kallman, MD;  Location: [facility];  Service: Endoscopy;  Laterality: N/A;   ESOPHAGOGASTRODUODENOSCOPY (EGD) WITH PROPOFOL  N/A 01/30/2014   Procedure: ESOPHAGOGASTRODUODENOSCOPY (EGD) WITH PROPOFOL ;  Surgeon: Garrett Kallman, MD;  Location: [facility];  Service: Endoscopy;  Laterality: N/A;   IR ANGIOGRAM SELECTIVE EACH ADDITIONAL VESSEL  07/19/2018   IR ANGIOGRAM SELECTIVE EACH ADDITIONAL VESSEL  07/19/2018   IR ANGIOGRAM SELECTIVE EACH ADDITIONAL VESSEL  07/19/2018   IR ANGIOGRAM SELECTIVE EACH ADDITIONAL VESSEL  07/19/2018   IR ANGIOGRAM SELECTIVE EACH ADDITIONAL VESSEL  07/19/2018   IR ANGIOGRAM VISCERAL SELECTIVE  07/19/2018   IR EMBO TUMOR ORGAN ISCHEMIA INFARCT INC GUIDE ROADMAPPING  07/19/2018   IR RADIOLOGIST EVAL & MGMT  07/07/2018   IR RADIOLOGIST EVAL & MGMT  08/16/2018   IR RADIOLOGIST EVAL & MGMT  01/31/2019   IR RADIOLOGIST EVAL & MGMT  04/25/2019   IR RADIOLOGIST EVAL & MGMT  07/20/2019   IR RADIOLOGIST EVAL & MGMT  10/17/2019   IR RADIOLOGIST EVAL & MGMT  01/30/2020   IR RADIOLOGIST EVAL & MGMT  08/14/2020   IR  RADIOLOGIST EVAL & MGMT  02/24/2022   IR RADIOLOGIST EVAL & MGMT  08/11/2022   IR RADIOLOGIST EVAL & MGMT  04/27/2023   IR RADIOLOGIST EVAL & MGMT  10/28/2023   IR US  GUIDE VASC ACCESS LEFT  07/19/2018   left jaw surgery      MANDIBLE FRACTURE SURGERY      Allergies: No Known Allergies  Outpatient Meds: Current Outpatient Medications  Medication Sig Dispense Refill   amLODipine  (NORVASC ) 5 MG tablet Take 1 tablet (5 mg total) by mouth daily. 90 tablet 1   atorvastatin  (LIPITOR) 20 MG tablet Take 1 tablet (20 mg total) by mouth daily. 90 tablet 1   omeprazole  (PRILOSEC) 20 MG capsule Take 20 mg by mouth daily.     aspirin  EC 81 MG tablet Take 81 mg by mouth every morning. Reported on 12/19/2015     chlorproMAZINE  (THORAZINE ) 10 MG tablet Take 1 tablet (10 mg total) by mouth 3 (three) times daily as needed. For hiccups 90 tablet 1   nicotine  (NICODERM CQ ) 14 mg/24hr patch Place 1 patch (14 mg total) onto the skin daily. Then decrease to 7mg /24hr x4 weeks (Patient not taking: Reported on 01/05/2024) 28 patch 1   Current Facility-Administered Medications  Medication Dose Route Frequency Provider Last Rate Last Admin   0.9 %  sodium chloride  infusion  500 mL Intravenous Once Danis, Shakinah Navis L III, MD  ___________________________________________________________________ Objective   Exam:  BP 130/81   Pulse 76   Temp 97.8 F (36.6 C)   Resp 18   Ht 5\' 6"  (1.676 m)   Wt 113 lb (51.3 kg)   SpO2 99%   BMI 18.24 kg/m   CV: regular , S1/S2 Resp: clear to auscultation bilaterally, normal RR and effort noted GI: soft, no tenderness, with active bowel sounds.   Assessment: Encounter Diagnosis  Name Primary?   Colon cancer screening Yes     Plan: Colonoscopy   The benefits and risks of the planned procedure(s) were described in detail with the patient or (when appropriate) their health care proxy.  Risks were outlined as including, but not limited to, bleeding, infection,  perforation, adverse medication reaction leading to cardiac or pulmonary decompensation, pancreatitis (if ERCP).  The limitation of incomplete mucosal visualization was also discussed.  No guarantees or warranties were given.  The patient is appropriate for an endoscopic procedure in the ambulatory setting.   - Lorella Roles, MD

## 2024-01-05 NOTE — Patient Instructions (Signed)
 Please read handouts provided. Continue present medications. Await pathology results.   YOU HAD AN ENDOSCOPIC PROCEDURE TODAY AT THE [facility]:   Refer to the procedure report that was given to you for any specific questions about what was found during the examination.  If the procedure report does not answer your questions, please call your gastroenterologist to clarify.  If you requested that your care partner not be given the details of your procedure findings, then the procedure report has been included in a sealed envelope for you to review at your convenience later.  YOU SHOULD EXPECT: Some feelings of bloating in the abdomen. Passage of more gas than usual.  Walking can help get rid of the air that was put into your GI tract during the procedure and reduce the bloating. If you had a lower endoscopy (such as a colonoscopy or flexible sigmoidoscopy) you may notice spotting of blood in your stool or on the toilet paper. If you underwent a bowel prep for your procedure, you may not have a normal bowel movement for a few days.  Please Note:  You might notice some irritation and congestion in your nose or some drainage.  This is from the oxygen used during your procedure.  There is no need for concern and it should clear up in a day or so.  SYMPTOMS TO REPORT IMMEDIATELY:  Following lower endoscopy (colonoscopy or flexible sigmoidoscopy):  Excessive amounts of blood in the stool  Significant tenderness or worsening of abdominal pains  Swelling of the abdomen that is new, acute  Fever of 100F or higher  For urgent or emergent issues, a gastroenterologist can be reached at any hour by calling [phone]. Do not use MyChart messaging for urgent concerns.    DIET:  We do recommend a small meal at first, but then you may proceed to your regular diet.  Drink plenty of fluids but you should avoid alcoholic beverages for 24 hours.  ACTIVITY:  You should plan to take it easy for  the rest of today and you should NOT DRIVE or use heavy machinery until tomorrow (because of the sedation medicines used during the test).    FOLLOW UP: Our staff will call the number listed on your records the next business day following your procedure.  We will call around 7:15- 8:00 am to check on you and address any questions or concerns that you may have regarding the information given to you following your procedure. If we do not reach you, we will leave a message.     If any biopsies were taken you will be contacted by phone or by letter within the next 1-3 weeks.  Please call us at [phone] if you have not heard about the biopsies in 3 weeks.    SIGNATURES/CONFIDENTIALITY: You and/or your care partner have signed paperwork which will be entered into your electronic medical record.  These signatures attest to the fact that that the information above on your After Visit Summary has been reviewed and is understood.  Full responsibility of the confidentiality of this discharge information lies with you and/or your care-partner.

## 2024-01-06 ENCOUNTER — Telehealth: Payer: Self-pay

## 2024-01-06 NOTE — Telephone Encounter (Signed)
  Follow up Call-     01/05/2024   12:54 PM 11/16/2023   10:45 AM  Call back number  Post procedure Call Back phone  # [phone]  Permission to leave phone message Yes Yes     Patient questions:  Do you have a fever, pain , or abdominal swelling? No. Pain Score  0 *  Have you tolerated food without any problems? Yes.    Have you been able to return to your normal activities? Yes.    Do you have any questions about your discharge instructions: Diet   No. Medications  No. Follow up visit  No.  Do you have questions or concerns about your Care? No.  Actions: * If pain score is 4 or above: No action needed, pain <4.

## 2024-01-07 ENCOUNTER — Other Ambulatory Visit: Payer: Self-pay

## 2024-01-10 LAB — SURGICAL PATHOLOGY

## 2024-01-11 ENCOUNTER — Ambulatory Visit: Payer: Self-pay | Admitting: Gastroenterology

## 2024-04-12 ENCOUNTER — Telehealth: Payer: Self-pay | Admitting: Family Medicine

## 2024-04-12 NOTE — Telephone Encounter (Signed)
 Pt confirmed appt 9/10 (per vr)

## 2024-04-13 ENCOUNTER — Other Ambulatory Visit: Payer: Self-pay

## 2024-04-13 ENCOUNTER — Encounter: Payer: Self-pay | Admitting: Family Medicine

## 2024-04-13 ENCOUNTER — Ambulatory Visit: Attending: Family Medicine | Admitting: Family Medicine

## 2024-04-13 VITALS — BP 119/72 | HR 81 | Ht 66.0 in | Wt 115.0 lb

## 2024-04-13 DIAGNOSIS — Z79899 Other long term (current) drug therapy: Secondary | ICD-10-CM

## 2024-04-13 DIAGNOSIS — I10 Essential (primary) hypertension: Secondary | ICD-10-CM | POA: Diagnosis not present

## 2024-04-13 DIAGNOSIS — R1903 Right lower quadrant abdominal swelling, mass and lump: Secondary | ICD-10-CM | POA: Diagnosis not present

## 2024-04-13 DIAGNOSIS — F17210 Nicotine dependence, cigarettes, uncomplicated: Secondary | ICD-10-CM | POA: Diagnosis not present

## 2024-04-13 DIAGNOSIS — Z8505 Personal history of malignant neoplasm of liver: Secondary | ICD-10-CM | POA: Diagnosis not present

## 2024-04-13 MED ORDER — ATORVASTATIN CALCIUM 20 MG PO TABS
20.0000 mg | ORAL_TABLET | Freq: Every day | ORAL | 1 refills | Status: DC
  Filled 2024-04-13: qty 90, 90d supply, fill #0

## 2024-04-13 MED ORDER — FLUZONE HIGH-DOSE 0.5 ML IM SUSY
0.5000 mL | PREFILLED_SYRINGE | Freq: Once | INTRAMUSCULAR | 0 refills | Status: AC
  Filled 2024-04-13: qty 0.5, 1d supply, fill #0

## 2024-04-13 MED ORDER — AMLODIPINE BESYLATE 5 MG PO TABS
5.0000 mg | ORAL_TABLET | Freq: Every day | ORAL | 1 refills | Status: DC
  Filled 2024-04-13: qty 90, 90d supply, fill #0

## 2024-04-13 NOTE — Progress Notes (Signed)
 Subjective:  Patient ID: Calvin Gardner, male    DOB: [birth year]  Age: 73 y.o. MRN: [id number]  CC: Medical Management of Chronic Issues     Discussed the use of AI scribe software for clinical note transcription with the patient, who gave verbal consent to proceed.  History of Present Illness Calvin Gardner is a 73 year old male with  a history of hypertension, GERD, tobacco  (1ppd since the age of 69) and alcohol abuse, hepatocellular carcinoma (stage IIIa cT3,cN0,cM0) s/p IR transcatheter chemo embolization of the liver mass in 07/2018 who presents for a follow-up visit.  His blood pressure is well-controlled with amlodipine , and he is on atorvastatin  for cholesterol management. Recent blood tests in March showed normal kidney and liver function, but cholesterol levels were not assessed. He is fasting today for repeat blood tests to evaluate kidney, liver function, and cholesterol levels.  He experiences right lower quadrant swelling which resolves spontaneously with no associated pain, nausea or vomiting.  The swelling is absent at this time.SABRAHe recalls a past incident of lifting something heavy that caused pain.  He smokes occasionally and is not interested in quitting. He remains active by walking and biking regularly, which he finds beneficial for his health.    Past Medical History:  Diagnosis Date   Alcoholism (HCC)    Aortic atherosclerosis (HCC)    Arthritis    Cancer (HCC)    liver   Cataract    GERD (gastroesophageal reflux disease)    Hepatitis C    HLD (hyperlipidemia)    Hypertension    Liver mass    Pulmonary nodule    Substance abuse (HCC)     Past Surgical History:  Procedure Laterality Date   COLONOSCOPY WITH PROPOFOL  N/A 01/30/2014   Procedure: COLONOSCOPY WITH PROPOFOL ;  Surgeon: Gladis MARLA Louder, MD;  Location: [facility];  Service: Endoscopy;  Laterality: N/A;   ESOPHAGOGASTRODUODENOSCOPY (EGD) WITH PROPOFOL  N/A 01/30/2014   Procedure:  ESOPHAGOGASTRODUODENOSCOPY (EGD) WITH PROPOFOL ;  Surgeon: Gladis MARLA Louder, MD;  Location: [facility];  Service: Endoscopy;  Laterality: N/A;   IR ANGIOGRAM SELECTIVE EACH ADDITIONAL VESSEL  07/19/2018   IR ANGIOGRAM SELECTIVE EACH ADDITIONAL VESSEL  07/19/2018   IR ANGIOGRAM SELECTIVE EACH ADDITIONAL VESSEL  07/19/2018   IR ANGIOGRAM SELECTIVE EACH ADDITIONAL VESSEL  07/19/2018   IR ANGIOGRAM SELECTIVE EACH ADDITIONAL VESSEL  07/19/2018   IR ANGIOGRAM VISCERAL SELECTIVE  07/19/2018   IR EMBO TUMOR ORGAN ISCHEMIA INFARCT INC GUIDE ROADMAPPING  07/19/2018   IR RADIOLOGIST EVAL & MGMT  07/07/2018   IR RADIOLOGIST EVAL & MGMT  08/16/2018   IR RADIOLOGIST EVAL & MGMT  01/31/2019   IR RADIOLOGIST EVAL & MGMT  04/25/2019   IR RADIOLOGIST EVAL & MGMT  07/20/2019   IR RADIOLOGIST EVAL & MGMT  10/17/2019   IR RADIOLOGIST EVAL & MGMT  01/30/2020   IR RADIOLOGIST EVAL & MGMT  08/14/2020   IR RADIOLOGIST EVAL & MGMT  02/24/2022   IR RADIOLOGIST EVAL & MGMT  08/11/2022   IR RADIOLOGIST EVAL & MGMT  04/27/2023   IR RADIOLOGIST EVAL & MGMT  10/28/2023   IR US  GUIDE VASC ACCESS LEFT  07/19/2018   left jaw surgery      MANDIBLE FRACTURE SURGERY      Family History  Problem Relation Age of Onset   Cancer Mother        type unknown   Emphysema Brother    Colon cancer Neg Hx  Esophageal cancer Neg Hx    Pancreatic cancer Neg Hx    Rectal cancer Neg Hx    Stomach cancer Neg Hx    Colon polyps Neg Hx     Social History   Socioeconomic History   Marital status: Single    Spouse name: Not on file   Number of children: 1   Years of education: Not on file   Highest education level: Not on file  Occupational History   Occupation: works in Quarry manager    Occupation: Retired  Tobacco Use   Smoking status: Some Days    Current packs/day: 1.00    Average packs/day: 1 pack/day for 50.0 years (50.0 ttl pk-yrs)    Types: Cigarettes   Smokeless tobacco: Never   Tobacco comments:    trying to quit, smoke  every other day  Vaping Use   Vaping status: Never Used  Substance and Sexual Activity   Alcohol use: Yes    Alcohol/week: 0.0 standard drinks of alcohol    Comment: 2-3 40 oz beers per day, x50 years   Drug use: No    Comment: remote IV drug use age 40    Sexual activity: Yes    Partners: Female, Male    Comment: patient declines  Other Topics Concern   Not on file  Social History Narrative   Not on file   Social Drivers of Health   Financial Resource Strain: Low Risk  (04/27/2023)   Overall Financial Resource Strain (CARDIA)    Difficulty of Paying Living Expenses: Not hard at all  Food Insecurity: No Food Insecurity (04/27/2023)   Hunger Vital Sign    Worried About Running Out of Food in the Last Year: Never true    Ran Out of Food in the Last Year: Never true  Transportation Needs: No Transportation Needs (04/27/2023)   PRAPARE - Administrator, Civil Service (Medical): No    Lack of Transportation (Non-Medical): No  Physical Activity: Sufficiently Active (04/27/2023)   Exercise Vital Sign    Days of Exercise per Week: 7 days    Minutes of Exercise per Session: 30 min  Stress: No Stress Concern Present (04/27/2023)   [employer] of Occupational Health - Occupational Stress Questionnaire    Feeling of Stress : Not at all  Social Connections: Socially Isolated (04/27/2023)   Social Connection and Isolation Panel    Frequency of Communication with Friends and Family: Three times a week    Frequency of Social Gatherings with Friends and Family: Never    Attends Religious Services: Never    Database administrator or Organizations: No    Attends Engineer, structural: Never    Marital Status: Divorced    No Known Allergies  Outpatient Medications Prior to Visit  Medication Sig Dispense Refill   aspirin  EC 81 MG tablet Take 81 mg by mouth every morning. Reported on 12/19/2015     amLODipine  (NORVASC ) 5 MG tablet Take 1 tablet (5 mg total) by mouth  daily. 90 tablet 1   atorvastatin  (LIPITOR) 20 MG tablet Take 1 tablet (20 mg total) by mouth daily. 90 tablet 1   chlorproMAZINE  (THORAZINE ) 10 MG tablet Take 1 tablet (10 mg total) by mouth 3 (three) times daily as needed. For hiccups (Patient not taking: Reported on 04/13/2024) 90 tablet 1   nicotine  (NICODERM CQ ) 14 mg/24hr patch Place 1 patch (14 mg total) onto the skin daily. Then decrease to 7mg /24hr x4 weeks (Patient not  taking: Reported on 04/13/2024) 28 patch 1   omeprazole  (PRILOSEC) 20 MG capsule Take 20 mg by mouth daily. (Patient not taking: Reported on 04/13/2024)     No facility-administered medications prior to visit.     ROS Review of Systems  Constitutional:  Negative for activity change and appetite change.  HENT:  Negative for sinus pressure and sore throat.   Respiratory:  Negative for chest tightness, shortness of breath and wheezing.   Cardiovascular:  Negative for chest pain and palpitations.  Gastrointestinal:  Negative for abdominal distention, abdominal pain and constipation.  Genitourinary: Negative.   Musculoskeletal: Negative.   Psychiatric/Behavioral:  Negative for behavioral problems and dysphoric mood.     Objective:  BP 119/72   Pulse 81   Ht 5' 6 (1.676 m)   Wt 115 lb (52.2 kg)   SpO2 99%   BMI 18.56 kg/m      04/13/2024    9:47 AM 01/05/2024    2:15 PM 01/05/2024    2:05 PM  BP/Weight  Systolic BP 119 112 103  Diastolic BP 72 64 60  Wt. (Lbs) 115    BMI 18.56 kg/m2        Physical Exam Constitutional:      Appearance: He is well-developed.  Cardiovascular:     Rate and Rhythm: Normal rate.     Heart sounds: Normal heart sounds. No murmur heard. Pulmonary:     Effort: Pulmonary effort is normal.     Breath sounds: Normal breath sounds. No wheezing or rales.  Chest:     Chest wall: No tenderness.  Abdominal:     General: Bowel sounds are normal. There is no distension.     Palpations: Abdomen is soft. There is no mass.      Tenderness: There is no abdominal tenderness.     Hernia: No hernia is present.  Musculoskeletal:        General: Normal range of motion.     Right lower leg: No edema.     Left lower leg: No edema.  Neurological:     Mental Status: He is alert and oriented to person, place, and time.  Psychiatric:        Mood and Affect: Mood normal.        Latest Ref Rng & Units 10/12/2023    2:05 PM 10/12/2022    4:37 PM 12/19/2021    8:50 AM  CMP  Glucose 70 - 99 mg/dL 897  88  892   BUN 8 - 27 mg/dL 7  8  5    Creatinine 0.76 - 1.27 mg/dL 9.24  9.17  9.23   Sodium 134 - 144 mmol/L 137  139  136   Potassium 3.5 - 5.2 mmol/L 4.4  4.4  4.3   Chloride 96 - 106 mmol/L 100  103  101   CO2 20 - 29 mmol/L 22  22  23    Calcium  8.6 - 10.2 mg/dL 89.4  9.9  9.8   Total Protein 6.0 - 8.5 g/dL 7.5  7.2  7.4   Total Bilirubin 0.0 - 1.2 mg/dL 0.6  <9.7  0.4   Alkaline Phos 44 - 121 IU/L 58  54  56   AST 0 - 40 IU/L 35  29  45   ALT 0 - 44 IU/L 13  11  25      Lipid Panel     Component Value Date/Time   CHOL 156 12/19/2021 0850   TRIG 35 12/19/2021 0850  HDL 100 12/19/2021 0850   CHOLHDL 2.2 03/18/2017 0834   CHOLHDL 2.2 01/05/2014 1620   VLDL 12 01/05/2014 1620   LDLCALC 47 12/19/2021 0850    CBC    Component Value Date/Time   WBC 10.3 08/12/2020 0759   RBC 5.02 08/12/2020 0759   HGB 14.9 08/12/2020 0759   HGB 13.5 06/12/2019 1156   HGB 13.0 05/16/2018 1219   HCT 44.7 08/12/2020 0759   HCT 39.1 05/16/2018 1219   PLT 301 08/12/2020 0759   PLT 266 06/12/2019 1156   PLT 199 05/16/2018 1219   MCV 89.0 08/12/2020 0759   MCV 91 05/16/2018 1219   MCH 29.7 08/12/2020 0759   MCHC 33.3 08/12/2020 0759   RDW 11.6 08/12/2020 0759   RDW 12.1 (L) 05/16/2018 1219   LYMPHSABS 2.3 06/12/2019 1156   LYMPHSABS 1.0 05/16/2018 1219   MONOABS 1.0 06/12/2019 1156   EOSABS 0.1 06/12/2019 1156   EOSABS 0.0 05/16/2018 1219   BASOSABS 0.0 06/12/2019 1156   BASOSABS 0.1 05/16/2018 1219    Lab Results   Component Value Date   HGBA1C 5.0 10/07/2017       Assessment & Plan Right lower quadrant mass Intermittent pelvic muscle raising without pain. Differential includes muscle spasm or hernia. No definitive hernia signs on exam. - Order pelvic ultrasound to evaluate for hernia or abnormalities.  Hypertension Blood pressure well-controlled on amlodipine . -Counseled on blood pressure goal of less than 130/80, low-sodium, DASH diet, medication compliance, 150 minutes of moderate intensity exercise per week. Discussed medication compliance, adverse effects.  On statin therapy due to increased risk of future cardiovascular event Managed with atorvastatin . Cholesterol check overdue. - Order blood test for lipid panel. - Order blood test for kidney and liver function.  Greater than 20-pack-year history Continues occasional smoking, not ready to quit, declined cessation assistance. Up-to-date on low-dose lung cancer screening  History of hepatocellular carcinoma (stage IIIa cT3,cN0,cM0) s/p IR transcatheter chemo embolization of the liver mass in 07/2018    Meds ordered this encounter  Medications   amLODipine  (NORVASC ) 5 MG tablet    Sig: Take 1 tablet (5 mg total) by mouth daily.    Dispense:  90 tablet    Refill:  1   atorvastatin  (LIPITOR) 20 MG tablet    Sig: Take 1 tablet (20 mg total) by mouth daily.    Dispense:  90 tablet    Refill:  1    Follow-up: Return in about 6 months (around 10/11/2024) for Chronic medical conditions.       Corrina Sabin, MD, FAAFP. [facility] [place], [place] [phone]   04/13/2024, 2:35 PM

## 2024-04-13 NOTE — Patient Instructions (Addendum)
 PLEASE GO TO PHARMACY DOWNSTAIRS TO GET YOUR HIGH DOSE FLU VACCINE.

## 2024-04-14 ENCOUNTER — Ambulatory Visit: Payer: Self-pay | Admitting: Family Medicine

## 2024-04-14 LAB — LP+NON-HDL CHOLESTEROL
Cholesterol, Total: 138 mg/dL (ref 100–199)
HDL: 69 mg/dL (ref >39)
LDL Chol Calc (NIH): 55 mg/dL (ref 0–99)
Total Non-HDL-Chol (LDL+VLDL): 69 mg/dL (ref 0–129)
Triglycerides: 68 mg/dL (ref 0–149)
VLDL Cholesterol Cal: 14 mg/dL (ref 5–40)

## 2024-04-14 LAB — CMP14+EGFR
ALT: 18 IU/L (ref 0–44)
AST: 33 IU/L (ref 0–40)
Albumin: 4.9 g/dL — ABNORMAL HIGH (ref 3.8–4.8)
Alkaline Phosphatase: 60 IU/L (ref 44–121)
BUN/Creatinine Ratio: 10 (ref 10–24)
BUN: 11 mg/dL (ref 8–27)
Bilirubin Total: 1 mg/dL (ref 0.0–1.2)
CO2: 20 mmol/L (ref 20–29)
Calcium: 10.6 mg/dL — ABNORMAL HIGH (ref 8.6–10.2)
Chloride: 95 mmol/L — ABNORMAL LOW (ref 96–106)
Creatinine, Ser: 1.09 mg/dL (ref 0.76–1.27)
Globulin, Total: 2.9 g/dL (ref 1.5–4.5)
Glucose: 91 mg/dL (ref 70–99)
Potassium: 4.5 mmol/L (ref 3.5–5.2)
Sodium: 132 mmol/L — ABNORMAL LOW (ref 134–144)
Total Protein: 7.8 g/dL (ref 6.0–8.5)
eGFR: 73 mL/min/1.73 (ref >59)

## 2024-04-17 ENCOUNTER — Ambulatory Visit
Admission: RE | Admit: 2024-04-17 | Discharge: 2024-04-17 | Disposition: A | Discharge status: Home / Self Care | Source: Ambulatory Visit | Attending: Family Medicine | Admitting: Family Medicine

## 2024-04-17 DIAGNOSIS — R1903 Right lower quadrant abdominal swelling, mass and lump: Secondary | ICD-10-CM

## 2024-05-02 ENCOUNTER — Ambulatory Visit: Payer: Medicare Other

## 2024-07-04 ENCOUNTER — Emergency Department (HOSPITAL_COMMUNITY)

## 2024-07-04 ENCOUNTER — Ambulatory Visit: Payer: Self-pay

## 2024-07-04 ENCOUNTER — Inpatient Hospital Stay (HOSPITAL_COMMUNITY)

## 2024-07-04 ENCOUNTER — Inpatient Hospital Stay (HOSPITAL_COMMUNITY)
Admission: EM | Admit: 2024-07-04 | Discharge: 2024-07-15 | DRG: 035 | Disposition: A | Attending: Family Medicine | Admitting: Family Medicine

## 2024-07-04 ENCOUNTER — Encounter (HOSPITAL_COMMUNITY): Payer: Self-pay

## 2024-07-04 DIAGNOSIS — F17210 Nicotine dependence, cigarettes, uncomplicated: Secondary | ICD-10-CM | POA: Diagnosis present

## 2024-07-04 DIAGNOSIS — F1010 Alcohol abuse, uncomplicated: Secondary | ICD-10-CM | POA: Diagnosis present

## 2024-07-04 DIAGNOSIS — I10 Essential (primary) hypertension: Secondary | ICD-10-CM | POA: Diagnosis present

## 2024-07-04 DIAGNOSIS — H53462 Homonymous bilateral field defects, left side: Secondary | ICD-10-CM | POA: Diagnosis present

## 2024-07-04 DIAGNOSIS — F10239 Alcohol dependence with withdrawal, unspecified: Secondary | ICD-10-CM | POA: Diagnosis not present

## 2024-07-04 DIAGNOSIS — Z743 Need for continuous supervision: Secondary | ICD-10-CM | POA: Diagnosis not present

## 2024-07-04 DIAGNOSIS — I63231 Cerebral infarction due to unspecified occlusion or stenosis of right carotid arteries: Secondary | ICD-10-CM | POA: Diagnosis not present

## 2024-07-04 DIAGNOSIS — Z79899 Other long term (current) drug therapy: Secondary | ICD-10-CM

## 2024-07-04 DIAGNOSIS — I63511 Cerebral infarction due to unspecified occlusion or stenosis of right middle cerebral artery: Principal | ICD-10-CM | POA: Diagnosis present

## 2024-07-04 DIAGNOSIS — Z8505 Personal history of malignant neoplasm of liver: Secondary | ICD-10-CM | POA: Diagnosis not present

## 2024-07-04 DIAGNOSIS — G4489 Other headache syndrome: Secondary | ICD-10-CM | POA: Diagnosis not present

## 2024-07-04 DIAGNOSIS — Z781 Physical restraint status: Secondary | ICD-10-CM

## 2024-07-04 DIAGNOSIS — H547 Unspecified visual loss: Secondary | ICD-10-CM | POA: Diagnosis present

## 2024-07-04 DIAGNOSIS — R531 Weakness: Secondary | ICD-10-CM | POA: Diagnosis not present

## 2024-07-04 DIAGNOSIS — Z9221 Personal history of antineoplastic chemotherapy: Secondary | ICD-10-CM

## 2024-07-04 DIAGNOSIS — I6389 Other cerebral infarction: Secondary | ICD-10-CM | POA: Diagnosis not present

## 2024-07-04 DIAGNOSIS — R296 Repeated falls: Secondary | ICD-10-CM | POA: Diagnosis present

## 2024-07-04 DIAGNOSIS — I739 Peripheral vascular disease, unspecified: Secondary | ICD-10-CM | POA: Diagnosis present

## 2024-07-04 DIAGNOSIS — G9340 Encephalopathy, unspecified: Secondary | ICD-10-CM | POA: Diagnosis present

## 2024-07-04 DIAGNOSIS — R7301 Impaired fasting glucose: Secondary | ICD-10-CM | POA: Diagnosis present

## 2024-07-04 DIAGNOSIS — Z7982 Long term (current) use of aspirin: Secondary | ICD-10-CM | POA: Diagnosis not present

## 2024-07-04 DIAGNOSIS — I959 Hypotension, unspecified: Secondary | ICD-10-CM | POA: Diagnosis not present

## 2024-07-04 DIAGNOSIS — Z825 Family history of asthma and other chronic lower respiratory diseases: Secondary | ICD-10-CM | POA: Diagnosis not present

## 2024-07-04 DIAGNOSIS — G9389 Other specified disorders of brain: Secondary | ICD-10-CM | POA: Diagnosis not present

## 2024-07-04 DIAGNOSIS — I639 Cerebral infarction, unspecified: Secondary | ICD-10-CM | POA: Diagnosis present

## 2024-07-04 DIAGNOSIS — H538 Other visual disturbances: Secondary | ICD-10-CM | POA: Diagnosis not present

## 2024-07-04 DIAGNOSIS — G0400 Acute disseminated encephalitis and encephalomyelitis, unspecified: Secondary | ICD-10-CM | POA: Diagnosis not present

## 2024-07-04 DIAGNOSIS — R569 Unspecified convulsions: Secondary | ICD-10-CM | POA: Diagnosis not present

## 2024-07-04 DIAGNOSIS — Z7902 Long term (current) use of antithrombotics/antiplatelets: Secondary | ICD-10-CM | POA: Diagnosis not present

## 2024-07-04 DIAGNOSIS — R911 Solitary pulmonary nodule: Secondary | ICD-10-CM | POA: Diagnosis present

## 2024-07-04 DIAGNOSIS — E785 Hyperlipidemia, unspecified: Secondary | ICD-10-CM | POA: Diagnosis present

## 2024-07-04 DIAGNOSIS — I6521 Occlusion and stenosis of right carotid artery: Secondary | ICD-10-CM | POA: Diagnosis present

## 2024-07-04 DIAGNOSIS — I708 Atherosclerosis of other arteries: Secondary | ICD-10-CM

## 2024-07-04 DIAGNOSIS — I7090 Unspecified atherosclerosis: Secondary | ICD-10-CM | POA: Diagnosis not present

## 2024-07-04 DIAGNOSIS — Z720 Tobacco use: Secondary | ICD-10-CM | POA: Diagnosis present

## 2024-07-04 DIAGNOSIS — A812 Progressive multifocal leukoencephalopathy: Secondary | ICD-10-CM | POA: Diagnosis not present

## 2024-07-04 DIAGNOSIS — I69391 Dysphagia following cerebral infarction: Secondary | ICD-10-CM | POA: Diagnosis not present

## 2024-07-04 DIAGNOSIS — D849 Immunodeficiency, unspecified: Secondary | ICD-10-CM | POA: Diagnosis not present

## 2024-07-04 DIAGNOSIS — R519 Headache, unspecified: Secondary | ICD-10-CM | POA: Diagnosis present

## 2024-07-04 DIAGNOSIS — R1310 Dysphagia, unspecified: Secondary | ICD-10-CM | POA: Diagnosis present

## 2024-07-04 DIAGNOSIS — K219 Gastro-esophageal reflux disease without esophagitis: Secondary | ICD-10-CM | POA: Diagnosis present

## 2024-07-04 LAB — CBC WITH DIFFERENTIAL/PLATELET
Abs Immature Granulocytes: 0.01 K/uL (ref 0.00–0.07)
Basophils Absolute: 0.1 K/uL (ref 0.0–0.1)
Basophils Relative: 1 %
Eosinophils Absolute: 0.3 K/uL (ref 0.0–0.5)
Eosinophils Relative: 4 %
HCT: 38.1 % — ABNORMAL LOW (ref 39.0–52.0)
Hemoglobin: 12.4 g/dL — ABNORMAL LOW (ref 13.0–17.0)
Immature Granulocytes: 0 %
Lymphocytes Relative: 20 %
Lymphs Abs: 1.6 K/uL (ref 0.7–4.0)
MCH: 29.8 pg (ref 26.0–34.0)
MCHC: 32.5 g/dL (ref 30.0–36.0)
MCV: 91.6 fL (ref 80.0–100.0)
Monocytes Absolute: 1 K/uL (ref 0.1–1.0)
Monocytes Relative: 12 %
Neutro Abs: 4.8 K/uL (ref 1.7–7.7)
Neutrophils Relative %: 63 %
Platelets: 271 K/uL (ref 150–400)
RBC: 4.16 MIL/uL — ABNORMAL LOW (ref 4.22–5.81)
RDW: 13 % (ref 11.5–15.5)
WBC: 7.7 K/uL (ref 4.0–10.5)
nRBC: 0 % (ref 0.0–0.2)

## 2024-07-04 LAB — HEPATIC FUNCTION PANEL
ALT: 14 U/L (ref 0–44)
AST: 26 U/L (ref 15–41)
Albumin: 3.3 g/dL — ABNORMAL LOW (ref 3.5–5.0)
Alkaline Phosphatase: 41 U/L (ref 38–126)
Bilirubin, Direct: <0.1 mg/dL (ref 0.0–0.2)
Total Bilirubin: 0.5 mg/dL (ref 0.0–1.2)
Total Protein: 6.4 g/dL — ABNORMAL LOW (ref 6.5–8.1)

## 2024-07-04 LAB — BASIC METABOLIC PANEL WITH GFR
Anion gap: 11 (ref 5–15)
BUN: 10 mg/dL (ref 8–23)
CO2: 21 mmol/L — ABNORMAL LOW (ref 22–32)
Calcium: 10.1 mg/dL (ref 8.9–10.3)
Chloride: 102 mmol/L (ref 98–111)
Creatinine, Ser: 0.8 mg/dL (ref 0.61–1.24)
GFR, Estimated: >60 mL/min (ref >60)
Glucose, Bld: 104 mg/dL — ABNORMAL HIGH (ref 70–99)
Potassium: 4.2 mmol/L (ref 3.5–5.1)
Sodium: 135 mmol/L (ref 135–145)

## 2024-07-04 LAB — MAGNESIUM: Magnesium: 2 mg/dL (ref 1.7–2.4)

## 2024-07-04 LAB — PHOSPHORUS: Phosphorus: 4.6 mg/dL (ref 2.5–4.6)

## 2024-07-04 MED ORDER — LORAZEPAM 1 MG PO TABS: 0.0000 mg | ORAL_TABLET | Freq: Two times a day (BID) | ORAL | Status: AC

## 2024-07-04 MED ORDER — LORAZEPAM 2 MG/ML IJ SOLN: 1.0000 mg | INTRAMUSCULAR | Status: AC | PRN

## 2024-07-04 MED ORDER — LORAZEPAM 1 MG PO TABS
0.0000 mg | ORAL_TABLET | Freq: Four times a day (QID) | ORAL | Status: AC
  Administered 2024-07-04: 1 mg via ORAL
  Filled 2024-07-04: qty 4

## 2024-07-04 MED ORDER — ONDANSETRON HCL 4 MG PO TABS: 4.0000 mg | ORAL_TABLET | Freq: Four times a day (QID) | ORAL | Status: DC | PRN

## 2024-07-04 MED ORDER — ACETAMINOPHEN 650 MG RE SUPP: 650.0000 mg | Freq: Four times a day (QID) | RECTAL | Status: DC | PRN

## 2024-07-04 MED ORDER — ASPIRIN 325 MG PO TABS
325.0000 mg | ORAL_TABLET | Freq: Every day | ORAL | Status: DC
  Administered 2024-07-04 – 2024-07-05 (×2): 325 mg via ORAL
  Filled 2024-07-04 (×2): qty 1

## 2024-07-04 MED ORDER — CLOPIDOGREL BISULFATE 300 MG PO TABS
300.0000 mg | ORAL_TABLET | Freq: Once | ORAL | Status: AC
  Administered 2024-07-04: 300 mg via ORAL
  Filled 2024-07-04: qty 1

## 2024-07-04 MED ORDER — ONDANSETRON HCL 4 MG/2ML IJ SOLN: 4.0000 mg | Freq: Four times a day (QID) | INTRAMUSCULAR | Status: DC | PRN

## 2024-07-04 MED ORDER — STROKE: EARLY STAGES OF RECOVERY BOOK
Freq: Once | Status: AC
  Filled 2024-07-04: qty 1

## 2024-07-04 MED ORDER — ASPIRIN 300 MG RE SUPP: 300.0000 mg | Freq: Every day | RECTAL | Status: DC

## 2024-07-04 MED ORDER — CLOPIDOGREL BISULFATE 75 MG PO TABS
75.0000 mg | ORAL_TABLET | Freq: Every day | ORAL | Status: DC
  Administered 2024-07-05: 75 mg via ORAL
  Filled 2024-07-04: qty 1

## 2024-07-04 MED ORDER — ADULT MULTIVITAMIN W/MINERALS CH
1.0000 | ORAL_TABLET | Freq: Every day | ORAL | Status: DC
  Administered 2024-07-04 – 2024-07-15 (×11): 1 via ORAL
  Filled 2024-07-04 (×10): qty 1

## 2024-07-04 MED ORDER — FOLIC ACID 1 MG PO TABS
1.0000 mg | ORAL_TABLET | Freq: Every day | ORAL | Status: DC
  Administered 2024-07-04 – 2024-07-15 (×11): 1 mg via ORAL
  Filled 2024-07-04 (×11): qty 1

## 2024-07-04 MED ORDER — IOHEXOL 350 MG/ML SOLN
75.0000 mL | Freq: Once | INTRAVENOUS | Status: AC | PRN
  Administered 2024-07-04: 75 mL via INTRAVENOUS

## 2024-07-04 MED ORDER — THIAMINE HCL 100 MG/ML IJ SOLN
100.0000 mg | Freq: Every day | INTRAMUSCULAR | Status: DC
  Filled 2024-07-04 (×2): qty 2

## 2024-07-04 MED ORDER — NICOTINE 14 MG/24HR TD PT24: 14.0000 mg | MEDICATED_PATCH | Freq: Every day | TRANSDERMAL | Status: DC | PRN

## 2024-07-04 MED ORDER — ACETAMINOPHEN 325 MG PO TABS
650.0000 mg | ORAL_TABLET | Freq: Four times a day (QID) | ORAL | Status: DC | PRN
  Administered 2024-07-06 – 2024-07-13 (×4): 650 mg via ORAL
  Filled 2024-07-04 (×4): qty 2

## 2024-07-04 MED ORDER — THIAMINE MONONITRATE 100 MG PO TABS
100.0000 mg | ORAL_TABLET | Freq: Every day | ORAL | Status: DC
  Administered 2024-07-04 – 2024-07-15 (×11): 100 mg via ORAL
  Filled 2024-07-04 (×11): qty 1

## 2024-07-04 MED ORDER — LORAZEPAM 1 MG PO TABS: 1.0000 mg | ORAL_TABLET | ORAL | Status: AC | PRN

## 2024-07-04 NOTE — Plan of Care (Signed)
 Brief Phone discussion with Dr. Remonia with the ED team at [facility].  Mr. Calvin Gardner is a poor historian but essentially running into things for the last 2 weeks. Came to the ED on the insistence of family.  MRI with acute and subacute ischemia in the posterior Right internal capsule, adjacent deep gray nuclei and temporal lobe white matter. No hemorrhagic transformation or mass effect. 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  Plan: - Admit to [facility], do not admit to [facility]. - Frequent Neuro checks per stroke unit protocol - Recommend Vascular imaging with CTA head and neck - Recommend obtaining TTE - Recommend obtaining Lipid panel with LDL - Please start statin if LDL > 70 - Recommend HbA1c to evaluate for diabetes and how well it is controlled. - Antithrombotic - Aspirin  325mg  once along with plavix  300mg  once, followed by Aspirin  81mg  daily along with plavix  75mg  daily x 21days, followed by Aspirin  81mg  daily alone - Recommend DVT ppx - SBP goal - if CTA is negative for LVO or severe large vessel stenosis on the right, then aim for gradual normotension. If there is LVO or severe large vessel stenosis, treat only if SBP > 180. - Recommend Telemetry monitoring for arrythmia - Recommend bedside swallow screen prior to PO intake. - Stroke education booklet - Recommend PT/OT/SLP consult - Recommend Urine Tox screen.  Please page neurology for evaluation when patient arrives at [facility].  Plan discussed with Dr. Remonia on the phone.  Courtnay Petrilla [facility]

## 2024-07-04 NOTE — Telephone Encounter (Signed)
 FYI Only or Action Required?: FYI only for provider: ED advised.  Patient was last seen in primary care on 04/13/2024 by Delbert Clam, MD.  Called Nurse Triage reporting Gait Problem and Blurred Vision.  Symptoms began 2 weeks ago, most recent episode this AM.  Interventions attempted: Nothing.  Symptoms are: gradually worsening.  Triage Disposition: Call EMS 911 Now (overriding Go to ED Now (Notify PCP))  Patient/caregiver understands and will follow disposition?: Yes           Copied from CRM #[id number]. Topic: Clinical - Red Word Triage >> Jul 04, 2024  7:58 AM Jeoffrey H wrote: [facility] that prompted transfer to Nurse Triage: Patient stated he has been falling for 2 weeks and having blurry vision. When he goes places he has to hold on to something. He does not use a cane. Denied pain. Reason for Disposition  SEVERE dizziness (vertigo) (e.g., unable to walk without assistance)  Answer Assessment - Initial Assessment Questions 1. SYMPTOM: What is the main symptom you are concerned about? (e.g., weakness, numbness)     Off balance/unsteady gait; blurry vision. He states he will walk into stuff because he can not see it.  2. ONSET: When did this start? (e.g., minutes, hours, days; while sleeping)     2 weeks ago. Sudden.  3. LAST NORMAL: When was the last time you (the patient) were normal (no symptoms)?     Not currently having the symptoms but states he had an episode this morning, did not provide specific time.  4. PATTERN Does this come and go, or has it been constant since it started?  Is it present now?     Not present now, when he stands up he has blurry vision and is off balance.  5. CARDIAC SYMPTOMS: Have you had any of the following symptoms: chest pain, difficulty breathing, palpitations?     Patient did not answer.  6. NEUROLOGIC SYMPTOMS: Have you had any of the following symptoms: headache, dizziness, vision loss, double vision, changes in speech,  unsteady on your feet?     Changes in vision (states he has some vision loss and will run into objects that he can not se (loss of vision)). Unsteady on his feet, falling over and having to hold onto objects to walk.  7. OTHER SYMPTOMS: Do you have any other symptoms?     5-6 falls over the past 2 weeks.  RN called 911 for patient with his permission to take him to ED.  Protocols used: Neurologic Deficit-A-AH, Dizziness - Vertigo-A-AH

## 2024-07-04 NOTE — Consult Note (Signed)
 NEUROLOGY CONSULT NOTE   Date of service: July 04, 2024 Patient Name: Calvin Gardner MRN:  [id number] DOB:  [birth year] Chief Complaint: Off balance Requesting Provider: Celinda Alm Lot, MD  History of Present Illness  Calvin Gardner is a 73 y.o. male with a PMHx of alcohol abuse, aortic atherosclerosis, arthritis, chronic hepatitis C, liver cancer, cataract, HLD, HTN, pulmonary nodule and substance abuse who presented to the ED this morning with symptoms of intermittent headache and blurred vision for 2 weeks. During that time period, he experienced some falls and also frequently was running into things with the right side of his body. He also endorses formed visual hallucinations at times of a person - he is aware that the person is not real. He has felt off balance and has had difficulty ambulating. He denied numbness or weakness. MRI brain revealed acute and subacute right MCA territory strokes involving the posterior Right internal capsule, adjacent deep gray nuclei and temporal lobe white matter.    ROS  Comprehensive ROS performed and pertinent positives documented in HPI    Past History   Past Medical History:  Diagnosis Date   Alcohol abuse 10/07/2017   Alcoholism (HCC)    Aortic atherosclerosis    Arthritis    Cancer (HCC)    liver   Cataract    Chronic hepatitis C without hepatic coma (HCC) 09/19/2014   GERD (gastroesophageal reflux disease)    Hepatitis C    Hepatocellular carcinoma (HCC) 06/09/2018   HLD (hyperlipidemia)    Hypertension    Liver mass    Pulmonary nodule    Substance abuse (HCC)     Past Surgical History:  Procedure Laterality Date   COLONOSCOPY WITH PROPOFOL  N/A 01/30/2014   Procedure: COLONOSCOPY WITH PROPOFOL ;  Surgeon: Gladis MARLA Louder, MD;  Location: [facility];  Service: Endoscopy;  Laterality: N/A;   ESOPHAGOGASTRODUODENOSCOPY (EGD) WITH PROPOFOL  N/A 01/30/2014   Procedure: ESOPHAGOGASTRODUODENOSCOPY (EGD) WITH PROPOFOL ;   Surgeon: Gladis MARLA Louder, MD;  Location: [facility];  Service: Endoscopy;  Laterality: N/A;   IR ANGIOGRAM SELECTIVE EACH ADDITIONAL VESSEL  07/19/2018   IR ANGIOGRAM SELECTIVE EACH ADDITIONAL VESSEL  07/19/2018   IR ANGIOGRAM SELECTIVE EACH ADDITIONAL VESSEL  07/19/2018   IR ANGIOGRAM SELECTIVE EACH ADDITIONAL VESSEL  07/19/2018   IR ANGIOGRAM SELECTIVE EACH ADDITIONAL VESSEL  07/19/2018   IR ANGIOGRAM VISCERAL SELECTIVE  07/19/2018   IR EMBO TUMOR ORGAN ISCHEMIA INFARCT INC GUIDE ROADMAPPING  07/19/2018   IR RADIOLOGIST EVAL & MGMT  07/07/2018   IR RADIOLOGIST EVAL & MGMT  08/16/2018   IR RADIOLOGIST EVAL & MGMT  01/31/2019   IR RADIOLOGIST EVAL & MGMT  04/25/2019   IR RADIOLOGIST EVAL & MGMT  07/20/2019   IR RADIOLOGIST EVAL & MGMT  10/17/2019   IR RADIOLOGIST EVAL & MGMT  01/30/2020   IR RADIOLOGIST EVAL & MGMT  08/14/2020   IR RADIOLOGIST EVAL & MGMT  02/24/2022   IR RADIOLOGIST EVAL & MGMT  08/11/2022   IR RADIOLOGIST EVAL & MGMT  04/27/2023   IR RADIOLOGIST EVAL & MGMT  10/28/2023   IR US  GUIDE VASC ACCESS LEFT  07/19/2018   left jaw surgery      MANDIBLE FRACTURE SURGERY      Family History: Family History  Problem Relation Age of Onset   Cancer Mother        type unknown   Emphysema Brother    Colon cancer Neg Hx    Esophageal cancer Neg Hx  Pancreatic cancer Neg Hx    Rectal cancer Neg Hx    Stomach cancer Neg Hx    Colon polyps Neg Hx     Social History  reports that he has been smoking cigarettes. He has a 50 pack-year smoking history. He has never used smokeless tobacco. He reports current alcohol use. He reports that he does not use drugs.  No Known Allergies  Medications   Current Facility-Administered Medications:    [START ON 07/05/2024]  stroke: early stages of recovery book, , Does not apply, Once, Khaliqdina, Salman, MD   acetaminophen  (TYLENOL ) tablet 650 mg, 650 mg, Oral, Q6H PRN **OR** acetaminophen  (TYLENOL ) suppository 650 mg, 650 mg, Rectal, Q6H PRN,  Celinda Alm Lot, MD   aspirin  suppository 300 mg, 300 mg, Rectal, Daily **OR** aspirin  tablet 325 mg, 325 mg, Oral, Daily, Khaliqdina, Salman, MD, 325 mg at 07/04/24 1154   [START ON 07/05/2024] clopidogrel  (PLAVIX ) tablet 75 mg, 75 mg, Oral, Daily, Khaliqdina, Salman, MD   folic acid  (FOLVITE ) tablet 1 mg, 1 mg, Oral, Daily, Celinda Alm Lot, MD, 1 mg at 07/04/24 1530   LORazepam  (ATIVAN ) tablet 1-4 mg, 1-4 mg, Oral, Q1H PRN **OR** LORazepam  (ATIVAN ) injection 1-4 mg, 1-4 mg, Intravenous, Q1H PRN, Celinda Alm Lot, MD   LORazepam  (ATIVAN ) tablet 0-4 mg, 0-4 mg, Oral, Q6H, 1 mg at 07/04/24 1531 **FOLLOWED BY** [START ON 07/06/2024] LORazepam  (ATIVAN ) tablet 0-4 mg, 0-4 mg, Oral, Q12H, Celinda Alm Lot, MD   multivitamin with minerals tablet 1 tablet, 1 tablet, Oral, Daily, Celinda Alm Lot, MD, 1 tablet at 07/04/24 1530   nicotine  (NICODERM CQ  - dosed in mg/24 hours) patch 14 mg, 14 mg, Transdermal, Daily PRN, Celinda Alm Lot, MD   ondansetron  (ZOFRAN ) tablet 4 mg, 4 mg, Oral, Q6H PRN **OR** ondansetron  (ZOFRAN ) injection 4 mg, 4 mg, Intravenous, Q6H PRN, Celinda Alm Lot, MD   thiamine  (VITAMIN B1) tablet 100 mg, 100 mg, Oral, Daily, 100 mg at 07/04/24 1530 **OR** thiamine  (VITAMIN B1) injection 100 mg, 100 mg, Intravenous, Daily, Celinda Alm Lot, MD  No current facility-administered medications on file prior to encounter.   Current Outpatient Medications on File Prior to Encounter  Medication Sig Dispense Refill   acetaminophen  (TYLENOL ) 500 MG tablet Take 1,500 mg by mouth in the morning.     amLODipine  (NORVASC ) 5 MG tablet Take 1 tablet (5 mg total) by mouth daily. 90 tablet 1   atorvastatin  (LIPITOR) 20 MG tablet Take 1 tablet (20 mg total) by mouth daily. 90 tablet 1     Vitals   Vitals:   07/04/24 1533 07/04/24 1621 07/04/24 2040 07/04/24 2346  BP: (!) 141/81 (!) 132/92 109/65 104/65  Pulse: 89 (!) 54 77 (!) 53  Resp: 18 20 17 16   Temp: 98.1 F (36.7 C)  98.2 F (36.8 C) 98 F (36.7 C)   TempSrc: Oral Oral Oral   SpO2: 100% 100% 99% 100%    There is no height or weight on file to calculate BMI.   Physical Exam   Constitutional: Appears well-developed and well-nourished.  Psych: Affect appropriate to situation.  Eyes: No scleral injection.  HENT: No OP obstruction.  Head: Normocephalic.  Respiratory: Effort normal, non-labored breathing.  Skin: WDI.   Neurologic Examination   Mental Status: Alert, oriented x 5, thought content appropriate.  Speech fluent without evidence of aphasia.  Able to follow all commands without difficulty. Cranial Nerves: II: Left homonymous hemianopsia.   III,IV, VI: No ptosis. EOMI.  V: Temp  sensation equal bilaterally  VII: Smile symmetric VIII: Hearing intact to voice IX,X: No hoarseness XI: Symmetric shoulder shrug XII: Midline tongue extension Motor: RUE 5/5 proximally and distally LUE 5/5 proximally and distally No pronator drift Positive orbiting fingers test on the left (only sign of motor weakness noted) BLE 5/5 proximally and distally  No pronator drift.  Sensory: Decreased temp sensation to LUE and RLE. Positive for extinction on the left to DSS.  Deep Tendon Reflexes: 2+ and symmetric throughout Cerebellar: No ataxia with FNF bilaterally  Gait: Deferred  Labs/Imaging/Neurodiagnostic studies   CBC:  Recent Labs  Lab 07-22-2024 1045  WBC 7.7  NEUTROABS 4.8  HGB 12.4*  HCT 38.1*  MCV 91.6  PLT 271   Basic Metabolic Panel:  Lab Results  Component Value Date   NA 135 07-22-24   K 4.2 2024/07/22   CO2 21 (L) 07/22/2024   GLUCOSE 104 (H) 07-22-24   BUN 10 07-22-24   CREATININE 0.80 07-22-24   CALCIUM  10.1 22-Jul-2024   GFRNONAA >60 07-22-24   GFRAA 104 08/12/2020   Lipid Panel:  Lab Results  Component Value Date   LDLCALC 55 04/13/2024   HgbA1c:  Lab Results  Component Value Date   HGBA1C 5.0 10/07/2017   Urine Drug Screen: No results found for:  LABOPIA, COCAINSCRNUR, LABBENZ, AMPHETMU, THCU, LABBARB  Alcohol Level No results found for: [facility] INR  Lab Results  Component Value Date   INR 1.0 08/12/2020   APTT  Lab Results  Component Value Date   APTT 30 08/03/2018     ASSESSMENT  AQUIL DUHE is a 73 y.o. male with a PMHx of alcohol abuse, aortic atherosclerosis, arthritis, chronic hepatitis C, liver cancer, cataract, HLD, HTN, pulmonary nodule and substance abuse who presented to the ED this morning with symptoms of intermittent headache and blurred vision for 2 weeks. During that time period, he experienced some falls and also frequently was running into things with the right side of his body. He also endorses formed visual hallucinations at times of a person - he is aware that the person is not real. He has felt off balance and has had difficulty ambulating. He denied numbness or weakness. MRI brain revealed acute and subacute right MCA territory strokes involving the posterior Right internal capsule, adjacent deep gray nuclei and temporal lobe white matter. - Exam reveals left homonymous hemianopsia, LUE numbness and subtle motor deficit of the LUE.  - CT head:  No acute intracranial hemorrhage, evidence of acute territorial infarction, or mass effect. Relative to the prior study dated 08/03/2018, there is overall similar mild generalized parenchymal volume loss. Overall similar moderate scattered white matter hypodensities, which are nonspecific but most commonly represent chronic microvascular ischemic changes.  - CTA of head and neck: Plaque in the right carotid bulb with less than 50% stenosis. Calcified plaque in the left carotid bulb without significant stenosis. No significant vertebrobasilar or intracranial disease. - MRI brain: Patchy and indistinct combined acute and subacute ischemia in the posterior Right internal capsule, adjacent deep gray nuclei and temporal lobe white matter. No hemorrhagic  transformation or mass effect. 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 - Impression:  - Presentation is most likely due to a subacute right MCA territory ischemic strokes. DDx for underlying etiology includes cardioembolic and atherothrombotic.  - On personal review of  the MR images, the DWI and FLAIR abnormalities are somewhat atypica for stroke and other etiologies such as demyelinating disease, ADEM (per the literature, although ADEM is traditionally associated with common viral infections like measles and mumps, it has more recently been linked to rarer viral triggers, including HCV), and PML are also possible.   RECOMMENDATIONS  1. HgbA1c, fasting lipid panel 2. MRI brain with contrast (add-on study) to assess for possible abnormal enhancement (ordered)  3. PT consult, OT consult, Speech consult 4. Echocardiogram 5. Statin risks may outweigh benefits given his history of liver cancer, chronic hepatitis C and EtOH abuse 6. Agree with starting ASA and Plavix 7. Risk factor modification 8. Telemetry monitoring 9. BP management per standard protocol. Outside of the permissive HTN time window 10. NPO until passes stroke swallow screen  ______________________________________________________________________    Bonney SHARK, Phylis Javed, MD [facility]

## 2024-07-04 NOTE — ED Provider Notes (Signed)
 [place] [facility]GENC[facility] DEPARTMENT AT [facility] Provider Note   CSN: [id number] Arrival date & time: 07/04/24  9149     Patient presents with: Headache   Calvin Gardner is a 73 y.o. male.   73 year old male presents for evaluation of headache and vision changes.  He states my people made me come.  States for 2 weeks he has had a posterior headache and he is been running into things.  States he is also sometimes sees people that are not there.  States he has felt off balance and has some trouble walking.  Denies any numbness or tingling or any other symptoms or concerns.   Headache Associated symptoms: no abdominal pain, no back pain, no cough, no ear pain, no eye pain, no fever, no seizures, no sore throat and no vomiting        Prior to Admission medications   Medication Sig Start Date End Date Taking? Authorizing Provider  acetaminophen  (TYLENOL ) 500 MG tablet Take 1,500 mg by mouth in the morning.   Yes [provider]  amLODipine  (NORVASC ) 5 MG tablet Take 1 tablet (5 mg total) by mouth daily. 04/13/24  Yes Newlin, Corrina, MD  atorvastatin  (LIPITOR) 20 MG tablet Take 1 tablet (20 mg total) by mouth daily. 04/13/24  Yes Newlin, Enobong, MD    Allergies: Patient has no known allergies.    Review of Systems  Constitutional:  Negative for chills and fever.  HENT:  Negative for ear pain and sore throat.   Eyes:  Positive for visual disturbance. Negative for pain.  Respiratory:  Negative for cough and shortness of breath.   Cardiovascular:  Negative for chest pain and palpitations.  Gastrointestinal:  Negative for abdominal pain and vomiting.  Genitourinary:  Negative for dysuria and hematuria.  Musculoskeletal:  Negative for arthralgias and back pain.  Skin:  Negative for color change and rash.  Neurological:  Positive for headaches. Negative for seizures and syncope.  All other systems reviewed and are negative.   Updated Vital Signs BP (!)  141/81 (BP Location: Left Arm)   Pulse 89   Temp 98.1 F (36.7 C) (Oral)   Resp 18   SpO2 100%   Physical Exam Vitals and nursing note reviewed.  Constitutional:      General: He is not in acute distress.    Appearance: He is well-developed.  HENT:     Head: Normocephalic and atraumatic.  Eyes:     Extraocular Movements: Extraocular movements intact.     Conjunctiva/sclera: Conjunctivae normal.     Pupils: Pupils are equal, round, and reactive to light.  Cardiovascular:     Rate and Rhythm: Normal rate and regular rhythm.     Heart sounds: No murmur heard. Pulmonary:     Effort: Pulmonary effort is normal. No respiratory distress.     Breath sounds: Normal breath sounds.  Abdominal:     Palpations: Abdomen is soft.     Tenderness: There is no abdominal tenderness.  Musculoskeletal:        General: No swelling.     Cervical back: Neck supple.  Skin:    General: Skin is warm and dry.     Capillary Refill: Capillary refill takes less than 2 seconds.  Neurological:     Mental Status: He is alert. Mental status is at baseline.     GCS: GCS eye subscore is 4. GCS verbal subscore is 5. GCS motor subscore is 6.     Comments: Full strength  in bilateral upper and lower extremities, no facial asymmetry, there is loss of peripheral vision in the left lower quadrant of visual fields, visual fields otherwise intact  Psychiatric:        Mood and Affect: Mood normal.     (all labs ordered are listed, but only abnormal results are displayed) Labs Reviewed  BASIC METABOLIC PANEL WITH GFR - Abnormal; Notable for the following components:      Result Value   CO2 21 (*)    Glucose, Bld 104 (*)    All other components within normal limits  CBC WITH DIFFERENTIAL/PLATELET - Abnormal; Notable for the following components:   RBC 4.16 (*)    Hemoglobin 12.4 (*)    HCT 38.1 (*)    All other components within normal limits  CBC WITH DIFFERENTIAL/PLATELET  HEMOGLOBIN A1C  URINE DRUG SCREEN   MAGNESIUM  PHOSPHORUS  HEPATIC FUNCTION PANEL    EKG: EKG Interpretation Date/Time:  Tuesday July 04 2024 09:10:59 EST Ventricular Rate:  84 PR Interval:  143 QRS Duration:  77 QT Interval:  361 QTC Calculation: 427 R Axis:   84  Text Interpretation: Sinus rhythm Anterior infarct, old Compared with prior EKG from 08/03/2018 Confirmed by Gennaro Bouchard ([id number]) on 07/04/2024 9:15:47 AM  Radiology: CT ANGIO HEAD NECK W WO CM Result Date: 07/04/2024 CLINICAL DATA:  Neuro deficit, acute stroke suspected EXAM: CT ANGIOGRAPHY HEAD AND NECK WITH AND WITHOUT CONTRAST TECHNIQUE: Multidetector CT imaging of the head and neck was performed using the standard protocol during bolus administration of intravenous contrast. Multiplanar CT image reconstructions and MIPs were obtained to evaluate the vascular anatomy. Carotid stenosis measurements (when applicable) are obtained utilizing NASCET criteria, using the distal internal carotid diameter as the denominator. RADIATION DOSE REDUCTION: This exam was performed according to the departmental dose-optimization program which includes automated exposure control, adjustment of the mA and/or kV according to patient size and/or use of iterative reconstruction technique. CONTRAST:  75mL OMNIPAQUE  IOHEXOL  350 MG/ML SOLN COMPARISON:  Brain MRI July 04, 2024 CTA NECK: CTA NECK Aortic arch: No proximal vessel stenosis. Right carotid: There is plaque in the carotid bulb with less than 50% stenosis Left carotid: There is plaque in the carotid bulb with no significant stenosis Right vertebral: Patent without significant abnormality Left vertebral: Patent without significant abnormality Soft tissues: No significant abnormality Other comments: There is emphysema CTA HEAD: CTA HEAD Right anterior circulation: The internal carotid artery is patent without significant stenosis. The anterior and middle cerebral arteries are patent without significant stenosis or proximal  branch occlusion. No aneurysm. Left anterior circulation: The internal carotid artery is patent without significant stenosis. The anterior and middle cerebral arteries are patent without significant stenosis or proximal branch occlusion. No aneurysm. Posterior circulation: Both vertebral arteries are patent. There is no significant basilar stenosis. Both posterior cerebral arteries are patent without significant stenosis or proximal branch occlusion. No aneurysm. IMPRESSION: 1. Plaque in the right carotid bulb with less than 50% stenosis 2. Calcified plaque in the left carotid bulb without significant stenosis 3. No significant vertebrobasilar or intracranial disease 4. Electronically Signed   By: Nancyann Burns M.D.   On: 07/04/2024 13:30   MR BRAIN WO CONTRAST Result Date: 07/04/2024 EXAM: MRI BRAIN WITHOUT CONTRAST 07/04/2024 11:03:59 AM TECHNIQUE: Multiplanar multisequence MRI of the head/brain was performed without the administration of intravenous contrast. COMPARISON: Head CT this morning reported separately. CLINICAL HISTORY: 73 year old male. Vision loss left lower quadrant, headache x2 weeks. FINDINGS: BRAIN AND VENTRICLES:  Patchy and indistinct abnormal diffusion in the posterior right basal ganglia, the posterior limb of the right internal capsule (series 9 image 28) and tracking posteriorly to the right periatrial and posterior right temporal lobe white matter (series 17 image 15) with mixed T2 shine through and isointense ADC in those areas. The DWI signal abnormality also tracks into the right cerebral peduncle on series 9 image 24, faintly restricted there. Associated T2 and FLAIR hyperintensity in these areas, also patchy and indistinct. No associated blood products or mass effect. Heterogeneous T2 shine through in the pons. No other diffusion restriction. Moderate T2 and FLAIR hyperintensity in the pons outlining some of the transverse pontine fibers as seen on series 13 image 18. No pontine  enlargement or blood products. This area appears facilitated on ADC. Small areas of subacute or chronic cortical encephalomalacia in the high right motor strip on series 13 image 42, also facilitated on ADC. No other cortical encephalomalacia identified. Otherwise scattered mild white matter T2 and FLAIR hyperintensity. No convincing chronic cerebral blood products on SWI. No midline shift. No hydrocephalus. The sella is unremarkable. Normal flow voids. ORBITS: Postoperative changes to both globes. SINUSES AND MASTOIDS: Scattered mild to moderate bilateral paranasal sinus mucosal thickening. Mastoids are well aerated. BONES AND SOFT TISSUES: Normal background bone marrow signal. No acute soft tissue abnormality. Partially visible cervical spine degeneration. IMPRESSION: 1. Patchy and indistinct combined acute and subacute ischemia in the posterior Right internal capsule, adjacent deep gray nuclei and temporal lobe white matter. No hemorrhagic transformation or mass effect. 2. 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 3. 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 Electronically signed by: Helayne Hurst MD 07/04/2024 11:20 AM EST RP Workstation: [id number]   CT Head Wo Contrast Result Date: 07/04/2024 EXAM: CT HEAD WITHOUT CONTRAST 07/04/2024 09:54:32 AM TECHNIQUE: CT of the head was performed without the administration of intravenous contrast. Automated exposure control, iterative reconstruction, and/or weight based adjustment of the mA/kV was utilized to reduce the radiation dose to as low as reasonably achievable. COMPARISON: CT head 08/03/2018. CLINICAL HISTORY: vision loss, headache FINDINGS: BRAIN AND VENTRICLES: No acute hemorrhage. No evidence of acute infarct. No hydrocephalus. No extra-axial collection. No mass effect or midline shift. There is overall similar mild generalized  parenchymal volume loss. Overall similar moderate scattered white matter hypodensities, which are nonspecific but most commonly represent chronic microvascular ischemic changes. ORBITS: Orbits demonstrate bilateral lens replacement. SINUSES: Scattered opacification of the bilateral ethmoid air cells. SOFT TISSUES AND SKULL: No acute soft tissue abnormality. No skull fracture. Chronic left zygomatic arch deformity. IMPRESSION: 1. No acute intracranial hemorrhage, evidence of acute territorial infarction, or mass effect. 2. No substantial change additional chronic findings since 08/03/2018. Electronically signed by: prentice spade 07/04/2024 10:41 AM EST RP Workstation: [id number]     Procedures   Medications Ordered in the ED   stroke: early stages of recovery book (has no administration in time range)  aspirin  suppository 300 mg ( Rectal See Alternative 07/04/24 1154)    Or  aspirin  tablet 325 mg (325 mg Oral Given 07/04/24 1154)  clopidogrel  (PLAVIX ) tablet 75 mg (has no administration in time range)  acetaminophen  (TYLENOL ) tablet 650 mg (has no administration in time range)    Or  acetaminophen  (TYLENOL ) suppository 650 mg (has no administration in time range)  ondansetron  (ZOFRAN ) tablet 4 mg (has no administration in time range)  Or  ondansetron  (ZOFRAN ) injection 4 mg (has no administration in time range)  LORazepam (ATIVAN) tablet 1-4 mg (has no administration in time range)    Or  LORazepam (ATIVAN) injection 1-4 mg (has no administration in time range)  thiamine (VITAMIN B1) tablet 100 mg (100 mg Oral Given 07/04/24 1530)    Or  thiamine (VITAMIN B1) injection 100 mg ( Intravenous See Alternative 07/04/24 1530)  folic acid (FOLVITE) tablet 1 mg (1 mg Oral Given 07/04/24 1530)  multivitamin with minerals tablet 1 tablet (1 tablet Oral Given 07/04/24 1530)  LORazepam (ATIVAN) tablet 0-4 mg (1 mg Oral Given 07/04/24 1531)    Followed by  LORazepam (ATIVAN) tablet 0-4 mg (has no  administration in time range)  clopidogrel (PLAVIX) tablet 300 mg (300 mg Oral Given 07/04/24 1153)  iohexol  (OMNIPAQUE ) 350 MG/ML injection 75 mL (75 mLs Intravenous Contrast Given 07/04/24 1228)                                    Medical Decision Making Cardiac monitor interpretation: Sinus rhythm, no ectopy  Patient here for vision changes.  Does have left lower quadrant visual field cut treatment.  Found to have multiple acute strokes on his MRI brain.  This was discussed with neurology and patient will be admitted at [facility].  Spoke with hospitalist Dr. Celinda and he said he will plan to admit the patient.  Patient agreeable to plan.  Laboratory unremarkable otherwise, neuroexam fairly unremarkable otherwise  Problems Addressed: Acute CVA (cerebrovascular accident) [facility]): undiagnosed new problem with uncertain prognosis Vision loss: undiagnosed new problem with uncertain prognosis  Amount and/or Complexity of Data Reviewed External Data Reviewed: notes.    Details: No prior ED records for review Labs: ordered. Decision-making details documented in ED Course.    Details: Ordered and reviewed by me and unremarkable Radiology: ordered and independent interpretation performed. Decision-making details documented in ED Course.    Details: Ordered and discussed with neurology CT head, shows no acute abnormality MRI brain: Shows evidence of acute infarcts ECG/medicine tests: ordered and independent interpretation performed. Decision-making details documented in ED Course.    Details: Ordered and interpreted by me in the absence of cardiology shows sinus rhythm, no STEMI or significant change when compared to prior Discussion of management or test interpretation with external provider(s): Dr. Vanessa -neurology-recommended admission at [facility], will start him on Plavix and aspirin  and order further imaging  Dr. Celinda -hospitalist-accepts patient for admission  Risk OTC  drugs. Prescription drug management. Drug therapy requiring intensive monitoring for toxicity. Decision regarding hospitalization.      Final diagnoses:  Acute CVA (cerebrovascular accident) [facility])  Vision loss    ED Discharge Orders     None          Gennaro Duwaine CROME, DO 07/04/24 1550

## 2024-07-04 NOTE — Progress Notes (Signed)
 Pt admitted to rm 38 from [facility] ED. Initiated tele. Oriented pt to the unit. Vss. Call bell within reach.   Amado GORMAN Arabia, RN

## 2024-07-04 NOTE — ED Triage Notes (Signed)
 PT BIBA from home for intermittent headache with blurring vision x 2 weeks,endorses dizziness with some falling recently,  denies injuries.    115/79 84 HR  100% RA  149 CBG

## 2024-07-04 NOTE — H&P (Signed)
 History and Physical    Patient: Calvin Gardner FMW:[id number] DOB: 01/04/1951 DOA: 07/04/2024 DOS: the patient was seen and examined on 07/04/2024 PCP: Delbert Clam, MD  Patient coming from: Home  Chief Complaint:  Chief Complaint  Patient presents with   Headache   HPI: [name] is a 73 y.o. male with medical history significant of alcoholism, aortic atherosclerosis, osteoarthritis, history of hep C, liver cancer, cataract, GERD, hyperlipidemia, hypertension, pulmonary nodule, substance abuse who presented to the emergency department with complaints of an intermittent headache associated with blurred vision, dizziness and falls with reported injuries that started about 2 weeks ago. He denied fever, chills, rhinorrhea, sore throat, wheezing or hemoptysis.  No chest pain, palpitations, diaphoresis, PND, orthopnea or pitting edema of the lower extremities.  No abdominal pain, nausea, emesis, diarrhea, constipation, melena or hematochezia.  No flank pain, dysuria, frequency or hematuria.  No polyuria, polydipsia, polyphagia or blurred vision.   Lab work: CBC showed a white count of 7.7, hemoglobin 12.4 g/dL and platelets 728.  BMP showed a chloride of 21 mmol/L and a glucose of 104 mg/deciliter.  The rest of the electrolytes and renal function were normal.  Imaging: CT head without contrast with no acute intracranial hemorrhage, evidence of acute territorial infarction, or mass effect.  No substantial change in chronic findings since 08/03/2018.  MRI of the brain without contrast showing patchy and indistinct combined acute and subacute ischemia in the posterior right internal capsule, adjacent deep gray nuclei and temporal lobe white matter.  No hemorrhagic transformation or mass effect.  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  Moderate send no abnormality in the pons which could be chronic small vessel disease related versus due to  previous myelinolysis.  No LVO on CTA head and neck.  There is some coronary artery disease with less than 50% stenosis.  ED course: Initial vital signs were temperature 98.1 F, pulse 85, respiration 18, BP 132/75 mmHg and O2 sat 100% on room air.  The patient was given aspirin  and clopidogrel by neurology.   Review of Systems: As mentioned in the history of present illness. All other systems reviewed and are negative. Past Medical History:  Diagnosis Date   Alcoholism (HCC)    Aortic atherosclerosis    Arthritis    Cancer (HCC)    liver   Cataract    GERD (gastroesophageal reflux disease)    Hepatitis C    HLD (hyperlipidemia)    Hypertension    Liver mass    Pulmonary nodule    Substance abuse (HCC)    Past Surgical History:  Procedure Laterality Date   COLONOSCOPY WITH PROPOFOL  N/A 01/30/2014   Procedure: COLONOSCOPY WITH PROPOFOL ;  Surgeon: Gladis MARLA Louder, MD;  Location: [facility] ENDOSCOPY;  Service: Endoscopy;  Laterality: N/A;   ESOPHAGOGASTRODUODENOSCOPY (EGD) WITH PROPOFOL  N/A 01/30/2014   Procedure: ESOPHAGOGASTRODUODENOSCOPY (EGD) WITH PROPOFOL ;  Surgeon: Gladis MARLA Louder, MD;  Location: [facility] ENDOSCOPY;  Service: Endoscopy;  Laterality: N/A;   IR ANGIOGRAM SELECTIVE EACH ADDITIONAL VESSEL  07/19/2018   IR ANGIOGRAM SELECTIVE EACH ADDITIONAL VESSEL  07/19/2018   IR ANGIOGRAM SELECTIVE EACH ADDITIONAL VESSEL  07/19/2018   IR ANGIOGRAM SELECTIVE EACH ADDITIONAL VESSEL  07/19/2018   IR ANGIOGRAM SELECTIVE EACH ADDITIONAL VESSEL  07/19/2018   IR ANGIOGRAM VISCERAL SELECTIVE  07/19/2018   IR EMBO TUMOR ORGAN ISCHEMIA INFARCT INC GUIDE ROADMAPPING  07/19/2018   IR RADIOLOGIST EVAL & MGMT  07/07/2018   IR  RADIOLOGIST EVAL & MGMT  08/16/2018   IR RADIOLOGIST EVAL & MGMT  01/31/2019   IR RADIOLOGIST EVAL & MGMT  04/25/2019   IR RADIOLOGIST EVAL & MGMT  07/20/2019   IR RADIOLOGIST EVAL & MGMT  10/17/2019   IR RADIOLOGIST EVAL & MGMT  01/30/2020   IR RADIOLOGIST EVAL & MGMT  08/14/2020   IR  RADIOLOGIST EVAL & MGMT  02/24/2022   IR RADIOLOGIST EVAL & MGMT  08/11/2022   IR RADIOLOGIST EVAL & MGMT  04/27/2023   IR RADIOLOGIST EVAL & MGMT  10/28/2023   IR US  GUIDE VASC ACCESS LEFT  07/19/2018   left jaw surgery      MANDIBLE FRACTURE SURGERY     Social History:  reports that he has been smoking cigarettes. He has a 50 pack-year smoking history. He has never used smokeless tobacco. He reports current alcohol use. He reports that he does not use drugs.  No Known Allergies  Family History  Problem Relation Age of Onset   Cancer Mother        type unknown   Emphysema Brother    Colon cancer Neg Hx    Esophageal cancer Neg Hx    Pancreatic cancer Neg Hx    Rectal cancer Neg Hx    Stomach cancer Neg Hx    Colon polyps Neg Hx     Prior to Admission medications   Medication Sig Start Date End Date Taking? Authorizing Provider  amLODipine  (NORVASC ) 5 MG tablet Take 1 tablet (5 mg total) by mouth daily. 04/13/24   Newlin, Enobong, MD  aspirin  EC 81 MG tablet Take 81 mg by mouth every morning. Reported on 12/19/2015    [provider]  atorvastatin  (LIPITOR) 20 MG tablet Take 1 tablet (20 mg total) by mouth daily. 04/13/24   Delbert Clam, MD    Physical Exam: Vitals:   07/04/24 0857 07/04/24 0859 07/04/24 1130  BP:  132/75 (!) 139/92  Pulse:  85 73  Resp:  18 18  Temp: 98.1 F (36.7 C) 98.1 F (36.7 C)   TempSrc: Oral Oral   SpO2:  100% 100%   Physical Exam Vitals and nursing note reviewed.  Constitutional:      General: He is awake. He is not in acute distress.    Appearance: He is ill-appearing.  HENT:     Head: Normocephalic.     Nose: No rhinorrhea.     Mouth/Throat:     Mouth: Mucous membranes are moist.  Eyes:     General: No scleral icterus.    Pupils: Pupils are equal, round, and reactive to light.  Neck:     Vascular: No JVD.  Cardiovascular:     Rate and Rhythm: Normal rate and regular rhythm.     Heart sounds: S1 normal and S2 normal.   Pulmonary:     Effort: Pulmonary effort is normal.     Breath sounds: Normal breath sounds. No wheezing, rhonchi or rales.  Abdominal:     General: There is no distension.     Palpations: Abdomen is soft.     Tenderness: There is no abdominal tenderness. There is no right CVA tenderness or left CVA tenderness.  Musculoskeletal:     Cervical back: Neck supple.     Right lower leg: No edema.     Left lower leg: No edema.  Skin:    General: Skin is warm and dry.  Neurological:     General: No focal deficit present.  Mental Status: He is alert and oriented to person, place, and time.  Psychiatric:        Mood and Affect: Mood normal.        Behavior: Behavior normal. Behavior is cooperative.     Data Reviewed:  Results are pending, will review when available.  EKG: Vent. rate 84 BPM  PR interval 143 ms  QRS duration 77 ms  QT/QTcB 361/427 ms  P-R-T axes 83 84 67  Sinus rhythm  Anterior infarct, old  Assessment and Plan: Principal Problem:   Acute cerebrovascular accident (CVA) (HCC) Observation/PCU. Frequent neurochecks. Consult PT and OT. Check fasting lipids. Check hemoglobin A1c. Check echocardiogram. Risk factors modifications. Stroke team input appreciated.  Active Problems:   Essential hypertension, benign Allow permissive hypertension for now. Will aim for gradual normotension.    Tobacco abuse Tobacco cessation advised. Nicotine  replacement therapy ordered.    IFG (impaired fasting glucose) Check hemoglobin A1c.    Alcohol abuse Drinking 96 to 120 ounces of beer daily. CIWA protocol with lorazepam. Magnesium sulfate supplementation. Folate, MVI and thiamine. Consult TOC team.    GERD (gastroesophageal reflux disease) Antiacid, H2 blocker or PPI as needed.    Pulmonary nodule Follow-up with PCP or pulmonary as an outpatient.     Advance Care Planning:   Code Status: Full Code   Consults: Neurohospitalist team.  Family  Communication:   Severity of Illness: The appropriate patient status for this patient is INPATIENT. Inpatient status is judged to be reasonable and necessary in order to provide the required intensity of service to ensure the patient's safety. The patient's presenting symptoms, physical exam findings, and initial radiographic and laboratory data in the context of their chronic comorbidities is felt to place them at high risk for further clinical deterioration. Furthermore, it is not anticipated that the patient will be medically stable for discharge from the hospital within 2 midnights of admission.   * I certify that at the point of admission it is my clinical judgment that the patient will require inpatient hospital care spanning beyond 2 midnights from the point of admission due to high intensity of service, high risk for further deterioration and high frequency of surveillance required.*  Author: Alm Dorn Castor, MD 07/04/2024 11:45 AM  For on call review [email or web address].   This document was prepared using Dragon voice recognition software and may contain some unintended transcription errors.

## 2024-07-04 NOTE — Progress Notes (Signed)
  Echocardiogram 2D Echocardiogram rescheduled for tomorrow due to scheduling conflicts  Tinnie FORBES Gosling RDCS 07/04/2024, 3:16 PM

## 2024-07-04 NOTE — TOC Transition Note (Incomplete)
 Transition of Care [facility]) - Discharge Note   Patient Details  Name: Calvin Gardner MRN: [id number] Date of Birth: 12-22-1950  Transition of Care [facility]) CM/SW Contact:  Camelia JONETTA Cary, RN Phone Number: 07/04/2024, 2:49 PM   Clinical Narrative:             Patient Goals and CMS Choice            Discharge Placement                       Discharge Plan and Services Additional resources added to the After Visit Summary for                                       Social Drivers of Health (SDOH) Interventions SDOH Screenings   Food Insecurity: No Food Insecurity (04/27/2023)  Housing: Low Risk  (04/27/2023)  Transportation Needs: No Transportation Needs (04/27/2023)  Utilities: Not At Risk (04/27/2023)  Alcohol Screen: Low Risk  (04/27/2023)  Depression (PHQ2-9): Low Risk  (10/12/2023)  Financial Resource Strain: Low Risk  (04/27/2023)  Physical Activity: Sufficiently Active (04/27/2023)  Social Connections: Socially Isolated (04/27/2023)  Stress: No Stress Concern Present (04/27/2023)  Tobacco Use: High Risk (07/04/2024)  Health Literacy: Adequate Health Literacy (04/27/2023)     Readmission Risk Interventions     No data to display

## 2024-07-05 ENCOUNTER — Inpatient Hospital Stay (HOSPITAL_COMMUNITY)

## 2024-07-05 DIAGNOSIS — I639 Cerebral infarction, unspecified: Secondary | ICD-10-CM | POA: Diagnosis not present

## 2024-07-05 LAB — COMPREHENSIVE METABOLIC PANEL WITH GFR
ALT: 12 U/L (ref 0–44)
AST: 22 U/L (ref 15–41)
Albumin: 3.5 g/dL (ref 3.5–5.0)
Alkaline Phosphatase: 39 U/L (ref 38–126)
Anion gap: 9 (ref 5–15)
BUN: 6 mg/dL — ABNORMAL LOW (ref 8–23)
CO2: 27 mmol/L (ref 22–32)
Calcium: 9.7 mg/dL (ref 8.9–10.3)
Chloride: 102 mmol/L (ref 98–111)
Creatinine, Ser: 0.77 mg/dL (ref 0.61–1.24)
GFR, Estimated: >60 mL/min (ref >60)
Glucose, Bld: 105 mg/dL — ABNORMAL HIGH (ref 70–99)
Potassium: 4 mmol/L (ref 3.5–5.1)
Sodium: 138 mmol/L (ref 135–145)
Total Bilirubin: 0.7 mg/dL (ref 0.0–1.2)
Total Protein: 6.4 g/dL — ABNORMAL LOW (ref 6.5–8.1)

## 2024-07-05 LAB — ECHOCARDIOGRAM COMPLETE
AR max vel: 3.2 cm2
AV Peak grad: 2.9 mmHg
Ao pk vel: 0.85 m/s
Area-P 1/2: 3.08 cm2
Calc EF: 66.7 %
Height: 66 in
S' Lateral: 2.6 cm
Single Plane A2C EF: 66.6 %
Single Plane A4C EF: 69.1 %
Weight: 1998.4 [oz_av]

## 2024-07-05 LAB — CBC
HCT: 39.8 % (ref 39.0–52.0)
Hemoglobin: 13.1 g/dL (ref 13.0–17.0)
MCH: 29.6 pg (ref 26.0–34.0)
MCHC: 32.9 g/dL (ref 30.0–36.0)
MCV: 89.8 fL (ref 80.0–100.0)
Platelets: 283 K/uL (ref 150–400)
RBC: 4.43 MIL/uL (ref 4.22–5.81)
RDW: 13 % (ref 11.5–15.5)
WBC: 7.7 K/uL (ref 4.0–10.5)
nRBC: 0 % (ref 0.0–0.2)

## 2024-07-05 LAB — HEPATIC FUNCTION PANEL
ALT: 16 U/L (ref 0–44)
AST: 43 U/L — ABNORMAL HIGH (ref 15–41)
Albumin: 4.3 g/dL (ref 3.5–5.0)
Alkaline Phosphatase: 51 U/L (ref 38–126)
Bilirubin, Direct: <0.1 mg/dL (ref 0.0–0.2)
Total Bilirubin: 0.3 mg/dL (ref 0.0–1.2)
Total Protein: 7.3 g/dL (ref 6.5–8.1)

## 2024-07-05 LAB — PLATELET INHIBITION P2Y12: Platelet Function  P2Y12: 73 [PRU] — ABNORMAL LOW (ref 182–335)

## 2024-07-05 LAB — MAGNESIUM: Magnesium: 2.5 mg/dL — ABNORMAL HIGH (ref 1.7–2.4)

## 2024-07-05 LAB — LIPID PANEL
Cholesterol: 135 mg/dL (ref 0–200)
HDL: 68 mg/dL (ref >40)
LDL Cholesterol: 56 mg/dL (ref 0–99)
Total CHOL/HDL Ratio: 2 ratio
Triglycerides: 54 mg/dL (ref <150)
VLDL: 11 mg/dL (ref 0–40)

## 2024-07-05 LAB — PHOSPHORUS: Phosphorus: 3.7 mg/dL (ref 2.5–4.6)

## 2024-07-05 MED ORDER — GADOBUTROL 1 MMOL/ML IV SOLN
5.0000 mL | Freq: Once | INTRAVENOUS | Status: AC | PRN
  Administered 2024-07-05: 5 mL via INTRAVENOUS

## 2024-07-05 MED ORDER — ASPIRIN 81 MG PO CHEW
81.0000 mg | CHEWABLE_TABLET | Freq: Every day | ORAL | Status: DC
  Administered 2024-07-06 – 2024-07-15 (×10): 81 mg via ORAL
  Filled 2024-07-05 (×10): qty 1

## 2024-07-05 NOTE — Evaluation (Signed)
 Speech Language Pathology Evaluation Patient Details Name: Calvin Gardner MRN: [id number] DOB: [birth year] Today's Date: 07/05/2024 Time: 8477-8458 SLP Time Calculation (min) (ACUTE ONLY): 19 min  Problem List:  Patient Active Problem List   Diagnosis Date Noted   Acute cerebrovascular accident (CVA) (HCC) 07/04/2024   Helicobacter pylori infection 10/14/2023   Hepatocellular carcinoma (HCC) 06/09/2018   Pulmonary nodule 05/25/2018   GERD (gastroesophageal reflux disease) 01/10/2018   Hiccough 10/07/2017   Alcohol abuse 10/07/2017   Chest pain 10/29/2015   Liver fibrosis (HCC) 10/23/2014   Chronic hepatitis C without hepatic coma (HCC) 09/19/2014   IFG (impaired fasting glucose) 04/02/2014   Vitamin D  deficiency 04/02/2014   Abnormal LFTs 04/02/2014   Cataract, right eye 01/05/2014   Essential hypertension, benign 01/05/2014   Tobacco abuse 01/05/2014   Past Medical History:  Past Medical History:  Diagnosis Date   Alcohol abuse 10/07/2017   Alcoholism (HCC)    Aortic atherosclerosis    Arthritis    Cancer (HCC)    liver   Cataract    Chronic hepatitis C without hepatic coma (HCC) 09/19/2014   GERD (gastroesophageal reflux disease)    Hepatitis C    Hepatocellular carcinoma (HCC) 06/09/2018   HLD (hyperlipidemia)    Hypertension    Liver mass    Pulmonary nodule    Substance abuse (HCC)    Past Surgical History:  Past Surgical History:  Procedure Laterality Date   COLONOSCOPY WITH PROPOFOL  N/A 01/30/2014   Procedure: COLONOSCOPY WITH PROPOFOL ;  Surgeon: Gladis MARLA Louder, MD;  Location: [facility];  Service: Endoscopy;  Laterality: N/A;   ESOPHAGOGASTRODUODENOSCOPY (EGD) WITH PROPOFOL  N/A 01/30/2014   Procedure: ESOPHAGOGASTRODUODENOSCOPY (EGD) WITH PROPOFOL ;  Surgeon: Gladis MARLA Louder, MD;  Location: [facility];  Service: Endoscopy;  Laterality: N/A;   IR ANGIOGRAM SELECTIVE EACH ADDITIONAL VESSEL  07/19/2018   IR ANGIOGRAM SELECTIVE EACH ADDITIONAL VESSEL   07/19/2018   IR ANGIOGRAM SELECTIVE EACH ADDITIONAL VESSEL  07/19/2018   IR ANGIOGRAM SELECTIVE EACH ADDITIONAL VESSEL  07/19/2018   IR ANGIOGRAM SELECTIVE EACH ADDITIONAL VESSEL  07/19/2018   IR ANGIOGRAM VISCERAL SELECTIVE  07/19/2018   IR EMBO TUMOR ORGAN ISCHEMIA INFARCT INC GUIDE ROADMAPPING  07/19/2018   IR RADIOLOGIST EVAL & MGMT  07/07/2018   IR RADIOLOGIST EVAL & MGMT  08/16/2018   IR RADIOLOGIST EVAL & MGMT  01/31/2019   IR RADIOLOGIST EVAL & MGMT  04/25/2019   IR RADIOLOGIST EVAL & MGMT  07/20/2019   IR RADIOLOGIST EVAL & MGMT  10/17/2019   IR RADIOLOGIST EVAL & MGMT  01/30/2020   IR RADIOLOGIST EVAL & MGMT  08/14/2020   IR RADIOLOGIST EVAL & MGMT  02/24/2022   IR RADIOLOGIST EVAL & MGMT  08/11/2022   IR RADIOLOGIST EVAL & MGMT  04/27/2023   IR RADIOLOGIST EVAL & MGMT  10/28/2023   IR US  GUIDE VASC ACCESS LEFT  07/19/2018   left jaw surgery      MANDIBLE FRACTURE SURGERY     HPI:  73 yo male presented 12/2 HA, blurred vision, dizziness and falls CT (+) acute territorial infarct, MRI (+) acute subacute ischemia in posterior R internal capsule, temporal lobe, small subacute infarct in R MCA motor strip PMH ETOH abuse, aortic atherosclerosis, OA, Hep C, liver CA, cataract,HLD, HTN, Pulmonary nodule, substance abuse, smoker   Assessment / Plan / Recommendation Clinical Impression  Pt lives with brother and sister-in-law and states activity level is mostly having friends over and they sit outside He is responsible  for medication management and states he does not have to manage finances except paying his rent. Language and speech intelligibility are intact. Suspect he is at or close to baseline from cognitive standpoint. Responses were accurate for items assessed except memory which he scored in the mild deficit range. He recalled his rent is due today and needed min prompts to recall activity during PT assessment. Therapist encouraged him to write information in a specific place to refer to.  He reported prior to stroke had occasional difficulty recalling if he took his medications. SLP highly encouraged him to use a pill box or record each day after he takes his meds (stated he would buy a pill box). No follow up ST is recommended at present however educated pt if he notices increased difficulty with cognition (memory) once home he can be referred for OP ST.    SLP Assessment  SLP Recommendation/Assessment: Patient does not need any further Speech Language Pathology Services SLP Visit Diagnosis: Cognitive communication deficit (R41.841)     Assistance Recommended at Discharge     Functional Status Assessment Patient has not had a recent decline in their functional status  Frequency and Duration           SLP Evaluation Cognition  Overall Cognitive Status: No family/caregiver present to determine baseline cognitive functioning (suspect at or close to baseline) Arousal/Alertness: Awake/alert Orientation Level: Oriented X4 Year: 2025 Month: December Day of Week: Correct Attention: Sustained Sustained Attention: Appears intact Memory: Impaired (mild scored 9 and 10 is average range) Memory Impairment: Storage deficit Awareness: Appears intact Problem Solving: Appears intact Safety/Judgment: Appears intact       Comprehension  Auditory Comprehension Overall Auditory Comprehension: Appears within functional limits for tasks assessed Commands:  (2 of 3 step correct) Visual Recognition/Discrimination Discrimination: Not tested Reading Comprehension Reading Status: Not tested    Expression Expression Primary Mode of Expression: Verbal Verbal Expression Overall Verbal Expression: Appears within functional limits for tasks assessed Initiation: No impairment Level of Generative/Spontaneous Verbalization: Conversation Repetition:  (NT) Naming: No impairment Pragmatics: No impairment Written Expression Written Expression: Not tested   Oral / Motor  Oral  Motor/Sensory Function Overall Oral Motor/Sensory Function: Within functional limits Motor Speech Overall Motor Speech: Appears within functional limits for tasks assessed Respiration: Within functional limits Phonation: Normal Resonance: Within functional limits Articulation: Within functional limitis Intelligibility: Intelligible Motor Planning: Within functional limits Motor Speech Errors: Not applicable            Dustin Olam Bull 07/05/2024, 4:13 PM

## 2024-07-05 NOTE — Discharge Instructions (Addendum)
 Dear Calvin Gardner,   Congratulations for your interest in quitting smoking!  Find a program that suits you best: when you want to quit, how you need support, where you live, and how you like to learn.    If youre ready to get started TODAY, consider scheduling a visit through [email or web address].  Appointments are available from 8am to 8pm, Monday to Friday.   Most health insurance plans will cover some level of tobacco cessation visits and medications.    Additional Resources: [employer] are also available to help you quit & provide the support youll need. Many programs are available in both English and Spanish and have a long history of successfully helping people get off and stay off tobacco.    Quit Smoking Apps:  quitSTART at seriousbroker.de QuitGuide?at forgetparking.dk Online education and resources: Smokefree  at borders group.gov Free Telephone Coaching: QuitNow,  Call 1-800-QUIT-NOW (6232024466) or Text- Ready to 5163979302 *[name] has teamed up with Medicaid to offer a free 14 week program    Vaping- Want to Quit? Free 24/7 support. Call [facility]  [place], [place], [place], [place], [place]  [place]    [facility]  Discharge Instructions   Carotid Surgery  Please refer to the following instructions for your post-procedure care. Your surgeon or physician assistant will discuss any changes with you.  Activity  You are encouraged to walk as much as you can. You can slowly return to normal activities but must avoid strenuous activity and heavy lifting until your doctor tell you it's okay. Avoid activities such as vacuuming or swinging a golf club. You can drive after one week if you are comfortable and you are no longer taking prescription pain medications. It is normal to feel tired for serval weeks after your surgery. It is also normal to have  difficulty with sleep habits, eating, and bowel movements after surgery. These will go away with time.  Bathing/Showering  Shower daily after you go home. Do not soak in a bathtub, hot tub, or swim until the incision heals completely.  Incision Care  Shower every day. Clean your incision with mild soap and water. Pat the area dry with a clean towel. You do not need a bandage unless otherwise instructed. Do not apply any ointments or creams to your incision. You may have skin glue on your incision. Do not peel it off. It will come off on its own in about one week. Your incision may feel thickened and raised for several weeks after your surgery. This is normal and the skin will soften over time.   For Men Only: It's okay to shave around the incision but do not shave the incision itself for 2 weeks. It is common to have numbness under your chin that could last for several months.  Diet  Resume your normal diet. There are no special food restrictions following this procedure. A low fat/low cholesterol diet is recommended for all patients with vascular disease. In order to heal from your surgery, it is CRITICAL to get adequate nutrition. Your body requires vitamins, minerals, and protein. Vegetables are the best source of vitamins and minerals. Vegetables also provide the perfect balance of protein. Processed food has little nutritional value, so try to avoid this.  Medications  Resume taking all of your medications unless your doctor or physician assistant tells you not to. If your incision is causing pain, you may take over-the- counter pain relievers such as acetaminophen  (Tylenol ). If  you were prescribed a stronger pain medication, please be aware these medications can cause nausea and constipation. Prevent nausea by taking the medication with a snack or meal. Avoid constipation by drinking plenty of fluids and eating foods with a high amount of fiber, such as fruits, vegetables, and grains.  Do  not take Tylenol  if you are taking prescription pain medications.  Follow Up  Our office will schedule a follow up appointment 2-3 weeks following discharge.  Please call us  immediately for any of the following conditions  Increased pain, redness, drainage (pus) from your incision site. Fever of 101 degrees or higher. If you should develop stroke (slurred speech, difficulty swallowing, weakness on one side of your body, loss of vision) you should call 911 and go to the nearest emergency room.  Reduce your risk of vascular disease:  Stop smoking. If you would like help call QuitlineNC at 1-800-QUIT-NOW (407-129-1666) or [place] at 731-576-3399. Manage your cholesterol Maintain a desired weight Control your diabetes Keep your blood pressure down  If you have any questions, please call the office at [phone].

## 2024-07-05 NOTE — TOC Initial Note (Signed)
 Transition of Care [facility]) - Initial/Assessment Note    Patient Details  Name: Calvin Gardner MRN: [id number] Date of Birth: [birth year]  [facility]) CM/SW Contact:    Nola Devere Hands, RN Phone Number: 07/05/2024, 10:57 AM  Clinical Narrative:                 Patient is a 73 yr old male, from home,  presented to the ED this morning with symptoms of intermittent headache and blurred vision for 2 weeks. During that time period, he experienced some falls and also frequently was running into things with the right side of his body. He also endorses formed visual hallucinations at times of a person - he is aware that the person is not real. He has felt off balance and has had difficulty ambulating. He denied numbness or weakness. MRI brain revealed acute and subacute right MCA territory strokes involving the posterior Right internal capsule. Case manager contacted to arrange for outpatient occupational therapy, referral number is [id number], sent to [employer].   Expected Discharge Plan: (P)  (TBD)     Patient Goals and CMS Choice            Expected Discharge Plan and Services                                              Prior Living Arrangements/Services                       Activities of Daily Living      Permission Sought/Granted                  Emotional Assessment              Admission diagnosis:  Vision loss [H54.7] Acute CVA (cerebrovascular accident) (HCC) [I63.9] Acute cerebrovascular accident (CVA) (HCC) [I63.9] Patient Active Problem List   Diagnosis Date Noted   Acute cerebrovascular accident (CVA) (HCC) 07/04/2024   Helicobacter pylori infection 10/14/2023   Hepatocellular carcinoma (HCC) 06/09/2018   Pulmonary nodule 05/25/2018   GERD (gastroesophageal reflux disease) 01/10/2018   Hiccough 10/07/2017   Alcohol abuse 10/07/2017   Chest pain 10/29/2015   Liver fibrosis (HCC)  10/23/2014   Chronic hepatitis C without hepatic coma (HCC) 09/19/2014   IFG (impaired fasting glucose) 04/02/2014   Vitamin D  deficiency 04/02/2014   Abnormal LFTs 04/02/2014   Cataract, right eye 01/05/2014   Essential hypertension, benign 01/05/2014   Tobacco abuse 01/05/2014   PCP:  Delbert Clam, MD Pharmacy:   [facility] [address], [address] [place] [address] Phone: [phone] Fax: [phone]  [id number] [place], [place] - [address] AT [facility] [address] [place] [address] Phone: [phone] Fax: [phone]  [facility] - [place], [place] - [address] [place] [address] Phone: [phone] Fax: [phone]     Social Drivers of Health (SDOH) Social History: SDOH Screenings   Food Insecurity: No Food Insecurity (07/04/2024)  Housing: Unknown (07/04/2024)  Transportation Needs: No Transportation Needs (07/04/2024)  Utilities: Not At Risk (07/04/2024)  Alcohol Screen: Low Risk  (04/27/2023)  Depression (PHQ2-9): Low Risk  (10/12/2023)  Financial Resource Strain: Low Risk  (04/27/2023)  Physical Activity: Sufficiently Active (  04/27/2023)  Social Connections: Unknown (07/04/2024)  Stress: No Stress Concern Present (04/27/2023)  Tobacco Use: High Risk (07/04/2024)  Health Literacy: Adequate Health Literacy (04/27/2023)   SDOH Interventions:     Readmission Risk Interventions     No data to display

## 2024-07-05 NOTE — Evaluation (Addendum)
 Physical Therapy Evaluation Patient Details Name: Calvin Gardner MRN: [id number] DOB: 07-04-1951 Today's Date: 07/05/2024  History of Present Illness  73 yo male presented 12/2 HA, blurred vision, dizziness and falls CT (+) acute territorial infarct, MRI (+) acute subacute ischemia in posterior R internal capsule, temporal lobe, small subacute infarct in R MCA motor strip PMH ETOH abuse, aortic atherosclerosis, OA, Hep C, liver CA, cataract,HLD, HTN, Pulmonary nodule, substance abuse, smoker   Clinical Impression  PTA pt was independent for mobility with no AD. Pt presents with L visual field cut, decreased safety awareness/impulsivity, and impaired balance/gait pattern. Pt was unsteady with no AD with pt staggering right/left with a tendency to drift towards the left. Pt had improved stability and gait speed when using a RW. Pt required MinA when negotiating steps with no handrails per home set-up. Pt will need assist to negotiate steps upon return home as pt has 4 steps with no handrails. Pt scored a 12/24 on the DGI indicating pt is at an increased risk of falling. Recommending neuro OP PT to address balance deficits and work on independence with mobility. Pt has 24/7 assist available upon d/c home. Acute PT to follow.       If plan is discharge home, recommend the following: Assist for transportation;Help with stairs or ramp for entrance;A little help with walking and/or transfers;A little help with bathing/dressing/bathroom   Can travel by private vehicle    Yes    Equipment Recommendations Rolling walker (2 wheels)     Functional Status Assessment Patient has had a recent decline in their functional status and demonstrates the ability to make significant improvements in function in a reasonable and predictable amount of time.     Precautions / Restrictions Precautions Precautions: Fall Recall of Precautions/Restrictions: Intact Precaution/Restrictions Comments: SBP  <180 Restrictions Weight Bearing Restrictions Per Provider Order: No      Mobility  Bed Mobility Overal bed mobility: Independent   Transfers Overall transfer level: Modified independent Equipment used: None   Ambulation/Gait Ambulation/Gait assistance: Supervision, Contact guard assist Gait Distance (Feet): 600 Feet Assistive device: Rolling walker (2 wheels), None Gait Pattern/deviations: Step-through pattern, Decreased stride length, Drifts right/left Gait velocity: decr    General Gait Details: unsteady gait with no AD with pt staggering right/left. Tendency to drift towards the left with reported decreased vision on L side. Improved stability and gait speed with use of RW  Stairs Stairs: Yes Stairs assistance: Contact guard assist, Min assist Stair Management: One rail Right, One rail Left, Alternating pattern, Forwards Number of Stairs: 4 General stair comments: CGA when using bilateral handrails, MinA for steadying assist when using no handrails    Modified Rankin (Stroke Patients Only) Modified Rankin (Stroke Patients Only) Pre-Morbid Rankin Score: No symptoms Modified Rankin: Moderately severe disability     Balance Overall balance assessment: Needs assistance Sitting-balance support: No upper extremity supported, Feet supported Sitting balance-Leahy Scale: Good     Standing balance support: No upper extremity supported Standing balance-Leahy Scale: Fair  Standardized Balance Assessment Standardized Balance Assessment : Dynamic Gait Index   Dynamic Gait Index Level Surface: Mild Impairment Change in Gait Speed: Moderate Impairment Gait with Horizontal Head Turns: Mild Impairment Gait with Vertical Head Turns: Mild Impairment Gait and Pivot Turn: Moderate Impairment Step Over Obstacle: Mild Impairment Step Around Obstacles: Moderate Impairment Steps: Moderate Impairment Total Score: 12       Pertinent Vitals/Pain Pain Assessment Pain Assessment:  No/denies pain    Home Living Family/patient expects to  be discharged to:: Private residence Living Arrangements:  (siblings - brother, SIL) Available Help at Discharge: Family;Available 24 hours/day Type of Home: House Home Access: Stairs to enter;Ramped entrance Entrance Stairs-Rails: None Entrance Stairs-Number of Steps: 4 STE   Home Layout: One level Home Equipment: None Additional Comments: walks to the store or pays a friend to drive him to [employer], [employer] medicare transportation benefits to doctor appointments    Prior Function Prior Level of Function : Independent/Modified Independent  Mobility Comments: no AD PTA ADLs Comments: indep, manages meds, reports he forgets to take his BP medications     Extremity/Trunk Assessment   Upper Extremity Assessment Upper Extremity Assessment: Defer to OT evaluation    Lower Extremity Assessment Lower Extremity Assessment: Overall WFL for tasks assessed    Cervical / Trunk Assessment Cervical / Trunk Assessment: Normal  Communication   Communication Communication: No apparent difficulties    Cognition Arousal: Alert Behavior During Therapy: WFL for tasks assessed/performed, Impulsive   PT - Cognitive impairments: No family/caregiver present to determine baseline, Awareness, Attention, Sequencing, Safety/Judgement    PT - Cognition Comments: slightly impulsive with moving quickly and decreased attention to task Following commands: Impaired Following commands impaired: Follows multi-step commands inconsistently, Follows one step commands inconsistently, Follows one step commands with increased time     Cueing Cueing Techniques: Verbal cues, Tactile cues, Visual cues      PT Assessment Patient needs continued PT services  PT Problem List Decreased activity tolerance;Decreased balance;Decreased mobility       PT Treatment Interventions DME instruction;Gait training;Stair training;Functional mobility  training;Therapeutic activities;Therapeutic exercise;Balance training;Neuromuscular re-education;Patient/family education    PT Goals (Current goals can be found in the Care Plan section)  Acute Rehab PT Goals Patient Stated Goal: to work on balance PT Goal Formulation: With patient Time For Goal Achievement: 07/19/24 Potential to Achieve Goals: Good    Frequency Min 2X/week        AM-PAC PT 6 Clicks Mobility  Outcome Measure Help needed turning from your back to your side while in a flat bed without using bedrails?: None Help needed moving from lying on your back to sitting on the side of a flat bed without using bedrails?: None Help needed moving to and from a bed to a chair (including a wheelchair)?: A Little Help needed standing up from a chair using your arms (e.g., wheelchair or bedside chair)?: None Help needed to walk in hospital room?: A Little Help needed climbing 3-5 steps with a railing? : A Little 6 Click Score: 21    End of Session   Activity Tolerance: Patient tolerated treatment well Patient left: in bed;with call bell/phone within reach;with bed alarm set Nurse Communication: Mobility status PT Visit Diagnosis: Unsteadiness on feet (R26.81);Other abnormalities of gait and mobility (R26.89)    Time: 8866-8851 PT Time Calculation (min) (ACUTE ONLY): 15 min   Charges:   PT Evaluation $PT Eval Low Complexity: 1 Low   PT General Charges $$ ACUTE PT VISIT: 1 Visit        Kate ORN, PT, DPT Secure Chat Preferred  Rehab Office [phone]   Kate BRAVO Wendolyn 07/05/2024, 12:18 PM

## 2024-07-05 NOTE — Progress Notes (Signed)
 PROGRESS NOTE  JYRON TURMAN FMW:[id number] DOB: August 07, 1950 DOA: 07/04/2024 PCP: Delbert Clam, MD  HPI/Recap of past 24 hours: Vinie LITTIE Poncho is a 73 y.o. male with medical history significant of alcoholism, history of hep C, liver cancer, GERD, hyperlipidemia, hypertension, pulmonary nodule, substance abuse who presented to the emergency department with complaints of an intermittent headache associated with blurred vision, dizziness and falls with reported injuries that started about 2 weeks ago. CT head showed no acute intracranial abnormality. MRI of the brain showed patchy and indistinct combined acute and subacute ischemia in the posterior right internal capsule, adjacent deep gray nuclei and temporal lobe white matter. 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  Neurology consulted, patient admitted for further management.    Today, patient denies any new complaints.  Advised extensively about alcohol and tobacco cessation.    Assessment/Plan: Principal Problem:   Acute cerebrovascular accident (CVA) (HCC) Active Problems:   Essential hypertension, benign   Tobacco abuse   IFG (impaired fasting glucose)   Alcohol abuse   GERD (gastroesophageal reflux disease)   Pulmonary nodule  Acute and subacute CVA MRI brain revealed acute and subacute right MCA territory strokes involving the posterior Right internal capsule, adjacent deep gray nuclei and temporal lobe white matter CTA head and neck with no significant vertebrobasilar or intracranial disease Neurology/stroke team on board, rec MRI brain with contrast to assess for possible abnormal enhancement, to rule out demyelinating disease, acute disseminated encephalomyelitis versus PML MRI brain with contrast pending Echo pending LDL 56, A1c pending Continue aspirin , Plavix PT/OT/SLP Frequent neurochecks  Essential hypertension BP stable Holding PTA amlodipine    Tobacco  abuse Tobacco cessation advised. Nicotine  replacement therapy ordered.   Alcohol abuse Drinking 96 to 120 ounces of beer daily CIWA protocol with lorazepam. Magnesium sulfate supplementation. Folate, MVI and thiamine. Consult TOC team  History of hep C/liver carcinoma Outpatient follow-up  Pulmonary nodule Follow-up with PCP for pulmonary referral as an outpatient      Estimated body mass index is 18.56 kg/m as calculated from the following:   Height as of 04/13/24: 5' 6 (1.676 m).   Weight as of 04/13/24: 52.2 kg.     Code Status: Full  Family Communication: None at bedside  Disposition Plan: Status is: Inpatient Remains inpatient appropriate because: Level of care      Consultants: Neurology  Procedures: None  Antimicrobials: None  DVT prophylaxis:  SCDs   Objective: Vitals:   07/04/24 2346 07/05/24 0405 07/05/24 0751 07/05/24 1121  BP: 104/65 130/79 106/67 124/86  Pulse: (!) 53 74 (!) 56 65  Resp: 16 16 18 18   Temp: 98.1 F (36.7 C) 99 F (37.2 C) 98.4 F (36.9 C) 98.5 F (36.9 C)  TempSrc: Tympanic Oral  Oral  SpO2: 100% 100% 100% 100%   No intake or output data in the 24 hours ending 07/05/24 1306 There were no vitals filed for this visit.  Exam: General: NAD  Cardiovascular: S1, S2 present Respiratory: CTAB Abdomen: Soft, nontender, nondistended, bowel sounds present Musculoskeletal: No bilateral pedal edema noted Skin: Normal Psychiatry: Normal mood     Data Reviewed: CBC: Recent Labs  Lab 07/04/24 1045 07/05/24 0455  WBC 7.7 7.7  NEUTROABS 4.8  --   HGB 12.4* 13.1  HCT 38.1* 39.8  MCV 91.6 89.8  PLT 271 283   Basic Metabolic Panel: Recent Labs  Lab 07/04/24 0906 07/04/24 1937 07/05/24 0455  NA 135  --  138  K 4.2  --  4.0  CL 102  --  102  CO2 21*  --  27  GLUCOSE 104*  --  105*  BUN 10  --  6*  CREATININE 0.80  --  0.77  CALCIUM  10.1  --  9.7  MG 2.5* 2.0  --   PHOS 3.7 4.6  --    GFR: CrCl cannot be  calculated (Unknown ideal weight.). Liver Function Tests: Recent Labs  Lab 07/04/24 0906 07/04/24 1937 07/05/24 0455  AST 43* 26 22  ALT 16 14 12   ALKPHOS 51 41 39  BILITOT 0.3 0.5 0.7  PROT 7.3 6.4* 6.4*  ALBUMIN 4.3 3.3* 3.5   No results for input(s): LIPASE, AMYLASE in the last 168 hours. No results for input(s): AMMONIA in the last 168 hours. Coagulation Profile: No results for input(s): INR, PROTIME in the last 168 hours. Cardiac Enzymes: No results for input(s): CKTOTAL, CKMB, CKMBINDEX, TROPONINI in the last 168 hours. BNP (last 3 results) No results for input(s): PROBNP in the last 8760 hours. HbA1C: No results for input(s): HGBA1C in the last 72 hours. CBG: No results for input(s): GLUCAP in the last 168 hours. Lipid Profile: Recent Labs    07/05/24 0455  CHOL 135  HDL 68  LDLCALC 56  TRIG 54  CHOLHDL 2.0   Thyroid Function Tests: No results for input(s): TSH, T4TOTAL, FREET4, T3FREE, THYROIDAB in the last 72 hours. Anemia Panel: No results for input(s): VITAMINB12, FOLATE, FERRITIN, TIBC, IRON, RETICCTPCT in the last 72 hours. Urine analysis:    Component Value Date/Time   COLORURINE YELLOW 07/24/2018 1555   APPEARANCEUR CLEAR 07/24/2018 1555   LABSPEC 1.013 07/24/2018 1555   PHURINE 7.0 07/24/2018 1555   GLUCOSEU 50 (A) 07/24/2018 1555   HGBUR NEGATIVE 07/24/2018 1555   BILIRUBINUR NEGATIVE 07/24/2018 1555   KETONESUR NEGATIVE 07/24/2018 1555   PROTEINUR NEGATIVE 07/24/2018 1555   NITRITE NEGATIVE 07/24/2018 1555   LEUKOCYTESUR NEGATIVE 07/24/2018 1555   Sepsis Labs: @LABRCNTIP (procalcitonin:4,lacticidven:4)  )No results found for this or any previous visit (from the past 240 hours).    Studies: No results found.  Scheduled Meds:  aspirin   300 mg Rectal Daily   Or   aspirin   325 mg Oral Daily   clopidogrel  75 mg Oral Daily   folic acid  1 mg Oral Daily   LORazepam  0-4 mg Oral Q6H    Followed by   NOREEN ON 07/06/2024] LORazepam  0-4 mg Oral Q12H   multivitamin with minerals  1 tablet Oral Daily   thiamine  100 mg Oral Daily   Or   thiamine  100 mg Intravenous Daily    Continuous Infusions:   LOS: 1 day     Dorian Duval J Kollyns Mickelson, MD [facility]  If 7PM-7AM, please contact night-coverage [email or web address] 07/05/2024, 1:06 PM

## 2024-07-05 NOTE — Progress Notes (Addendum)
 STROKE TEAM PROGRESS NOTE    INTERIM HISTORY/SUBJECTIVE No family at the bedside. Patient states he has been falling and can't see on the left side for about 2 weeks now. It started with his left eye becoming red and painful with blurry vision. He also endorses a headache/neck pain that has been ongoing which he felt was a muscle pull.  MRI findings are atypical for acute stroke, recommend obtaining MRI brain with contrast for further evaluation   CBC    Component Value Date/Time   WBC 7.7 07/05/2024 0455   RBC 4.43 07/05/2024 0455   HGB 13.1 07/05/2024 0455   HGB 13.5 06/12/2019 1156   HGB 13.0 05/16/2018 1219   HCT 39.8 07/05/2024 0455   HCT 39.1 05/16/2018 1219   PLT 283 07/05/2024 0455   PLT 266 06/12/2019 1156   PLT 199 05/16/2018 1219   MCV 89.8 07/05/2024 0455   MCV 91 05/16/2018 1219   MCH 29.6 07/05/2024 0455   MCHC 32.9 07/05/2024 0455   RDW 13.0 07/05/2024 0455   RDW 12.1 (L) 05/16/2018 1219   LYMPHSABS 1.6 07/04/2024 1045   LYMPHSABS 1.0 05/16/2018 1219   MONOABS 1.0 07/04/2024 1045   EOSABS 0.3 07/04/2024 1045   EOSABS 0.0 05/16/2018 1219   BASOSABS 0.1 07/04/2024 1045   BASOSABS 0.1 05/16/2018 1219    BMET    Component Value Date/Time   NA 138 07/05/2024 0455   NA 132 (L) 04/13/2024 1016   K 4.0 07/05/2024 0455   CL 102 07/05/2024 0455   CO2 27 07/05/2024 0455   GLUCOSE 105 (H) 07/05/2024 0455   BUN 6 (L) 07/05/2024 0455   BUN 11 04/13/2024 1016   CREATININE 0.77 07/05/2024 0455   CREATININE 0.86 08/12/2020 0759   CALCIUM  9.7 07/05/2024 0455   EGFR 73 04/13/2024 1016   GFRNONAA >60 07/05/2024 0455   GFRNONAA 90 08/12/2020 0759    IMAGING past 24 hours No results found.  Vitals:   07/04/24 2346 07/05/24 0405 07/05/24 0751 07/05/24 1121  BP: 104/65 130/79 106/67 124/86  Pulse: (!) 53 74 (!) 56 65  Resp: 16 16 18 18   Temp: 98.1 F (36.7 C) 99 F (37.2 C) 98.4 F (36.9 C) 98.5 F (36.9 C)  TempSrc: Tympanic Oral  Oral  SpO2: 100% 100% 100%  100%     PHYSICAL EXAM General:  Alert, well-nourished, well-developed patient in no acute distress Psych:  Mood and affect appropriate for situation CV: Regular rate and rhythm on monitor Respiratory:  Regular, unlabored respirations on room air GI: Abdomen soft and nontender   NEURO:  Mental Status: AA&Ox3, patient is able to give clear and coherent history Speech/Language: speech is without dysarthria or aphasia.  Naming, repetition, fluency, and comprehension intact.  Cranial Nerves:  II: PERRL. Visual fields with left hemianopia  III, IV, VI: EOMI. Eyelids elevate symmetrically.  V: Sensation is intact to light touch and symmetrical to face.  VII: Face is symmetrical resting and smiling VIII: hearing intact to voice. IX, X: Palate elevates symmetrically. Phonation is normal.  KP:Dynloizm shrug 5/5. XII: tongue is midline without fasciculations. Motor: 5/5 strength to all muscle groups tested.  Tone: is normal and bulk is normal Sensation- Intact to light touch bilaterally. Extinction absent to light touch to DSS.   Coordination: FTN intact bilaterally, HKS: no ataxia in BLE.No drift. Decreased fine finger movements on left hand  Gait- deferred   ASSESSMENT/PLAN  Calvin Gardner is a 73 y.o. male with history of  alcohol abuse, aortic atherosclerosis, arthritis, chronic hepatitis C, liver cancer, cataract, HLD, HTN, pulmonary nodule and substance abuse who presented to the ED this morning with symptoms of intermittent headache and blurred vision for 2 weeks. During that time period, he experienced some falls and also frequently was running into things with the left side of his body.  Possible subacute  Ischemic Infarct:  right MCA versus underlying structural lesion Etiology:  workup pending   CT head No acute abnormality.  CTA head & neck: 1. Plaque in the right carotid bulb with less than 50% stenosis 2. Calcified plaque in the left carotid bulb without  significant stenosis MRI: 1. Patchy and indistinct combined acute and subacute ischemia in the posterior Right internal capsule, adjacent deep gray nuclei and temporal lobe white matter. No hemorrhagic transformation or mass effect. 2. 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 3. 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 MRI brain w contrast pending  2D Echo ordered LDL 56 HgbA1c pending  VTE prophylaxis - SCD's No antithrombotic prior to admission, now on aspirin  81 mg daily and clopidogrel 75 mg daily for 3 weeks and then ASA alone. Therapy recommendations:  Pending Disposition:  pending   Hypertension Home meds:  amlodipine  5mg   Stable Blood Pressure Goal: SBP less than 160   Hyperlipidemia Home meds:  atorvastatin  20mg , resumed in hospital LDL 56, goal < 70 Continue statin at discharge  Tobacco Abuse Patient smokes 10-15 cigarettes per day for years      Ready to quit? No Nicotine  replacement therapy provided  ETOH abuse  CIWA protocol  Drinking 96 to 120 ounces of beer daily   ETOH use advised to drink no more than 2 drink(s) a day Folate and thiamine daily   Dysphagia Patient has post-stroke dysphagia, SLP consulted    Diet   Diet Heart Room service appropriate? Yes; Fluid consistency: Thin   Advance diet as tolerated  Other Active Problems History of hep C/liver carcinoma   Hospital day # 1   Karna Geralds DNP, ACNPC-AG  [facility]  I have personally obtained history,examined this patient, reviewed notes, independently viewed imaging studies, participated in medical decision making and plan of care.ROS completed by me personally and pertinent positives fully documented  I have made any additions or clarifications directly to the above note. Agree with note above.  Patient presented with subacute symptoms of gait  difficulty and leaning to 1 side with hallucinations without definite symptoms suggestive of right brain stroke with MRI scan showing mild diffusion hyperintensity in the right temporal lobe and posterior capsule white matter extending up to the top of the brainstem not in a typical vascular distribution.  These findings could be from underlying structural lesion like lymphoma or low-grade glioma.  Recommend further evaluation with checking MRI scan of the brain with contrast.  Continue ongoing stroke workup.  Long discussion patient and family at the bedside.   I personally spent a total of 50 minutes in the care of the patient today including getting/reviewing separately obtained history, performing a medically appropriate exam/evaluation, counseling and educating, placing orders, referring and communicating with other health care professionals, documenting clinical information in the EHR, independently interpreting results, and coordinating care.        Eather Popp, MD Medical Director [facility] Pager: [phone] 07/05/2024 2:49 PM   To contact Stroke Continuity provider, please  refer to [email or web address]. After hours, contact General Neurology

## 2024-07-05 NOTE — Progress Notes (Signed)
 Echocardiogram 2D Echocardiogram has been performed.  Calvin Gardner 07/05/2024, 6:36 PM

## 2024-07-05 NOTE — Plan of Care (Signed)

## 2024-07-05 NOTE — Evaluation (Signed)
 Occupational Therapy Evaluation Patient Details Name: Calvin Gardner MRN: [id number] DOB: 29-May-1951 Today's Date: 07/05/2024   History of Present Illness   73 yo male presented 12/2 HA, blurred vision, dizziness and falls CT (+) acute territorial infarct, MRI (+) acute subacute ischemia in posterior R internal capsule, temporal lobe, small subacute infarct in R MCA motor strip PMH ETOH abuse, aortic atherosclerosis, OA, Hep C, liver CA, cataract,HLD, HTN, Pulmonary nodule, substance abuse, smoker     Clinical Impressions Pt received in bed, agreeable for OT visit. PTA, pt was living with his brother and SIL and reports fair support from them upon d/c. Indep with ADLs, enjoys cooking, uses insurance benefit for transportation or has friends drive him. Functionally, he was no more than supervision/CGA for UB/LB ADLs and CGA for ambulation without AD due to balance deficits. He presents with L hemianopsia, ? Inferior quadrantopsia. Difficulty with visual assessment at times due to test comprehension limitations. Educated on benefits of OPOT with neuro rehab focus.  Pt is currently functioning below baseline and would benefit from ongoing acute OT services to progress towards safe discharge and to facilitate return to prior level of function. Current recommendation is home with outpatient OT.     If plan is discharge home, recommend the following:   A little help with bathing/dressing/bathroom;Assistance with cooking/housework;Assist for transportation     Functional Status Assessment   Patient has had a recent decline in their functional status and demonstrates the ability to make significant improvements in function in a reasonable and predictable amount of time.     Equipment Recommendations   None recommended by OT     Recommendations for Other Services   PT consult     Precautions/Restrictions   Precautions Precautions: Fall Recall of Precautions/Restrictions:  Intact Precaution/Restrictions Comments: SBP <180 Restrictions Weight Bearing Restrictions Per Provider Order: No     Mobility Bed Mobility Overal bed mobility: Independent                  Transfers Overall transfer level: Modified independent Equipment used: None                      Balance Overall balance assessment: Needs assistance Sitting-balance support: No upper extremity supported, Feet supported Sitting balance-Leahy Scale: Good Sitting balance - Comments: seated EOB, no LOB observed   Standing balance support: No upper extremity supported Standing balance-Leahy Scale: Fair Standing balance comment: several LOB, tending to veer to the L side, often bumping into doorframes/walls in L environment, mostly self-corrects                           ADL either performed or assessed with clinical judgement   ADL Overall ADL's : Needs assistance/impaired Eating/Feeding: Set up   Grooming: Contact guard assist;Standing;Wash/dry hands Grooming Details (indicate cue type and reason): sequencing cues, assist to locate paper towels (in L environment)         Upper Body Dressing : Set up   Lower Body Dressing: Contact guard assist   Toilet Transfer: Ambulation;Regular Toilet;Supervision/safety   Toileting- Clothing Manipulation and Hygiene: Supervision/safety       Functional mobility during ADLs: Contact guard assist       Vision Baseline Vision/History: 0 No visual deficits Ability to See in Adequate Light: 0 Adequate Patient Visual Report: Blurring of vision;Eye fatigue/eye pain/headache Vision Assessment?: Yes Eye Alignment: Within Functional Limits Ocular Range of Motion: Within Functional Limits Alignment/Gaze  Preference: Head turned Tracking/Visual Pursuits: Able to track stimulus in all quads without difficulty Saccades: Impaired - to be further tested in functional context (unable) Convergence: Impaired - to be further tested  in functional context Visual Fields: Left homonymous hemianopsia;Left inferior homonymous quadranopsia;Left visual field deficit Depth Perception: Overshoots Additional Comments: L peripheral vision impairment, unable to detect stim until near midline from the L     Perception Perception: Not tested       Praxis Praxis: [facility]       Pertinent Vitals/Pain Pain Assessment Pain Assessment: No/denies pain     Extremity/Trunk Assessment Upper Extremity Assessment Upper Extremity Assessment: Overall WFL for tasks assessed;Right hand dominant (mild L hand weakness ~4+/5 to MMT)   Lower Extremity Assessment Lower Extremity Assessment: Overall WFL for tasks assessed   Cervical / Trunk Assessment Cervical / Trunk Assessment: Normal   Communication Communication Communication: No apparent difficulties   Cognition Arousal: Alert Behavior During Therapy: WFL for tasks assessed/performed, Impulsive Cognition: No apparent impairments             OT - Cognition Comments: reduced health literacy suspected, decr insight                 Following commands: Impaired Following commands impaired: Follows multi-step commands inconsistently, Follows one step commands inconsistently, Follows one step commands with increased time     Cueing  General Comments   Cueing Techniques: Verbal cues;Tactile cues;Visual cues  VSS   Exercises     Shoulder Instructions      Home Living Family/patient expects to be discharged to:: Private residence Living Arrangements:  (siblings - brother, SIL) Available Help at Discharge: Family;Available 24 hours/day Type of Home: House Home Access: Stairs to enter;Ramped entrance Entrance Stairs-Number of Steps: 4 STE Entrance Stairs-Rails: None Home Layout: One level     Bathroom Shower/Tub: Producer, Television/film/video: Standard     Home Equipment: None   Additional Comments: walks to the store or pays a friend to drive him to  [employer], [employer] medicare transportation benefits to doctor appointments      Prior Functioning/Environment Prior Level of Function : Independent/Modified Independent             Mobility Comments: no AD PTA ADLs Comments: indep, manages meds, reports he forgets to take his BP medications    OT Problem List: Impaired balance (sitting and/or standing);Impaired vision/perception;Decreased safety awareness   OT Treatment/Interventions: Self-care/ADL training;Therapeutic activities;Visual/perceptual remediation/compensation;Patient/family education;Balance training      OT Goals(Current goals can be found in the care plan section)   Acute Rehab OT Goals Patient Stated Goal: get better and stop falling OT Goal Formulation: With patient Time For Goal Achievement: 07/19/24 Potential to Achieve Goals: Good   OT Frequency:  Min 2X/week    Co-evaluation              AM-PAC OT 6 Clicks Daily Activity     Outcome Measure Help from another person eating meals?: None Help from another person taking care of personal grooming?: A Little Help from another person toileting, which includes using toliet, bedpan, or urinal?: A Little Help from another person bathing (including washing, rinsing, drying)?: A Little Help from another person to put on and taking off regular upper body clothing?: None Help from another person to put on and taking off regular lower body clothing?: A Little 6 Click Score: 20   End of Session Nurse Communication: Mobility status  Activity Tolerance: Patient tolerated treatment well  Patient left: in bed;with call bell/phone within reach;with bed alarm set  OT Visit Diagnosis: Unsteadiness on feet (R26.81);Other abnormalities of gait and mobility (R26.89);Repeated falls (R29.6)                Time: 9144-9069 OT Time Calculation (min): 35 min Charges:  OT General Charges $OT Visit: 1 Visit OT Evaluation $OT Eval Low Complexity: 1 Low OT  Treatments $Therapeutic Activity: 8-22 mins  Clarity Ciszek M. [place], OTR/L [facility] Acute Rehabilitation Services [phone] Secure Chat Preferred  Rikki Burma 07/05/2024, 1:30 PM

## 2024-07-06 ENCOUNTER — Other Ambulatory Visit: Payer: Self-pay

## 2024-07-06 ENCOUNTER — Inpatient Hospital Stay (HOSPITAL_COMMUNITY)

## 2024-07-06 DIAGNOSIS — R569 Unspecified convulsions: Secondary | ICD-10-CM

## 2024-07-06 DIAGNOSIS — I639 Cerebral infarction, unspecified: Secondary | ICD-10-CM | POA: Diagnosis not present

## 2024-07-06 NOTE — Plan of Care (Signed)

## 2024-07-06 NOTE — Progress Notes (Signed)
 Occupational Therapy Treatment Patient Details Name: Calvin Gardner MRN: [id number] DOB: May 04, 1951 Today's Date: 07/06/2024   History of present illness 73 yo male presented 12/2 HA, blurred vision, dizziness and falls CT (+) acute territorial infarct, MRI (+) acute subacute ischemia in posterior R internal capsule, temporal lobe, small subacute infarct in R MCA motor strip PMH ETOH abuse, aortic atherosclerosis, OA, Hep C, liver CA, cataract,HLD, HTN, Pulmonary nodule, substance abuse, smoker   OT comments  Pt greeted sitting EOB, agreeable for OT visit. Pt making fair progress towards OT goals today. He was mostly CGA for ambulation due to few lateral LOB's. Focus of today's session on multi-step command following, way finding, and utilizing compensatory strategies for visual deficits. Pt able to locate 3/4 items in hall with mod cueing for recalling items and problem solving where to locate them. More inattentive today, although suspect this is partially his baseline. Remains with L visual deficits, OT to continue to follow.      If plan is discharge home, recommend the following:  A little help with bathing/dressing/bathroom;Assistance with cooking/housework;Assist for transportation   Equipment Recommendations  None recommended by OT    Recommendations for Other Services      Precautions / Restrictions Precautions Precautions: Fall Recall of Precautions/Restrictions: Intact Precaution/Restrictions Comments: SBP <180 Restrictions Weight Bearing Restrictions Per Provider Order: No       Mobility Bed Mobility Overal bed mobility: Independent                  Transfers Overall transfer level: Modified independent Equipment used: None                     Balance Overall balance assessment: Needs assistance Sitting-balance support: No upper extremity supported, Feet supported Sitting balance-Leahy Scale: Good Sitting balance - Comments: sitting EOB    Standing balance support: No upper extremity supported, Single extremity supported, During functional activity Standing balance-Leahy Scale: Fair Standing balance comment: few lateral LOB during mobility, using rails in hallway to steady self                           ADL either performed or assessed with clinical judgement   ADL Overall ADL's : Needs assistance/impaired                     Lower Body Dressing: Minimal assistance;Cueing for sequencing;Sitting/lateral leans;Sit to/from stand Lower Body Dressing Details (indicate cue type and reason): cues to properly thread LE's through pants holes, sequencing cues             Functional mobility during ADLs: Contact guard assist      Extremity/Trunk Assessment              Vision       Perception     Praxis     Communication Communication Communication: No apparent difficulties   Cognition Arousal: Alert Behavior During Therapy: WFL for tasks assessed/performed, Impulsive Cognition: No apparent impairments             OT - Cognition Comments: impaired sequencing and problem solving (suspect partially his baseline)                 Following commands: Impaired        Cueing   Cueing Techniques: Verbal cues, Tactile cues, Visual cues  Exercises      Shoulder Instructions       General Comments VSS throughout  Pertinent Vitals/ Pain       Pain Assessment Pain Assessment: No/denies pain  Home Living                                          Prior Functioning/Environment              Frequency  Min 2X/week        Progress Toward Goals  OT Goals(current goals can now be found in the care plan section)  Progress towards OT goals: Progressing toward goals     Plan      Co-evaluation                 AM-PAC OT 6 Clicks Daily Activity     Outcome Measure   Help from another person eating meals?: None Help from another person  taking care of personal grooming?: A Little Help from another person toileting, which includes using toliet, bedpan, or urinal?: A Little Help from another person bathing (including washing, rinsing, drying)?: A Little Help from another person to put on and taking off regular upper body clothing?: None Help from another person to put on and taking off regular lower body clothing?: A Little 6 Click Score: 20    End of Session    OT Visit Diagnosis: Unsteadiness on feet (R26.81);Other abnormalities of gait and mobility (R26.89);Repeated falls (R29.6)   Activity Tolerance Patient tolerated treatment well   Patient Left in bed;with call bell/phone within reach   Nurse Communication Mobility status (missing keys)        Time: 8747-8684 OT Time Calculation (min): 23 min  Charges: OT General Charges $OT Visit: 1 Visit OT Treatments $Therapeutic Activity: 23-37 mins  Brody Bonneau M. [place], OTR/L [facility] Acute Rehabilitation Services [phone] Secure Chat Preferred  Orilla Templeman 07/06/2024, 1:44 PM

## 2024-07-06 NOTE — Progress Notes (Signed)
 PROGRESS NOTE  Calvin Gardner FMW:[id number] DOB: 1950-08-09 DOA: 07/04/2024 PCP: Delbert Clam, MD  HPI/Recap of past 24 hours: Calvin Gardner is a 73 y.o. male with medical history significant of alcoholism, history of hep C, liver cancer, GERD, hyperlipidemia, hypertension, pulmonary nodule, substance abuse who presented to the emergency department with complaints of an intermittent headache associated with blurred vision, dizziness and falls with reported injuries that started about 2 weeks ago. CT head showed no acute intracranial abnormality. MRI of the brain showed patchy and indistinct combined acute and subacute ischemia in the posterior right internal capsule, adjacent deep gray nuclei and temporal lobe white matter. 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  Neurology consulted, patient admitted for further management.    Denies any new complaints.    Assessment/Plan: Principal Problem:   Acute cerebrovascular accident (CVA) (HCC) Active Problems:   Essential hypertension, benign   Tobacco abuse   IFG (impaired fasting glucose)   Alcohol abuse   GERD (gastroesophageal reflux disease)   Pulmonary nodule  Acute and subacute CVA MRI brain revealed acute and subacute right MCA territory strokes involving the posterior Right internal capsule, adjacent deep gray nuclei and temporal lobe white matter CTA head and neck with no significant vertebrobasilar or intracranial disease MRI brain with contrast, showed no abnormal intracranial enhancement Neurology/stroke team on board, reports initial MRI findings could be from underlying structural lesions like lymphoma or paraneoplastic or infectious conditions given his immunocompromise state from history of hepatocellular carcinoma treated with chemotherapy.  Neurology discussed case with neuroradiologist for possible diagnostic spinal tap to look for autoimmune/paraneoplastic  etiology versus lymphoma Echo showed EF of 55 to 60%, no regional wall motion abnormality, left ventricular diastolic parameters normal LDL 56, A1c pending Continue aspirin , hold Plavix for possible spinal tap PT/OT/SLP, recommend outpatient PT Frequent neurochecks  Essential hypertension BP stable Holding PTA amlodipine    Tobacco abuse Tobacco cessation advised. Nicotine  replacement therapy ordered.   Alcohol abuse Drinking 96 to 120 ounces of beer daily CIWA protocol with lorazepam. Magnesium sulfate supplementation. Folate, MVI and thiamine. Consult TOC team  History of hep C/liver carcinoma Outpatient follow-up  Pulmonary nodule Follow-up with PCP for pulmonary referral as an outpatient      Estimated body mass index is 20.16 kg/m as calculated from the following:   Height as of this encounter: 5' 6 (1.676 m).   Weight as of this encounter: 56.7 kg.     Code Status: Full  Family Communication: None at bedside  Disposition Plan: Status is: Inpatient Remains inpatient appropriate because: Level of care      Consultants: Neurology  Procedures: None  Antimicrobials: None  DVT prophylaxis:  SCDs   Objective: Vitals:   07/06/24 0455 07/06/24 0716 07/06/24 1106 07/06/24 1515  BP: 119/78 108/75 103/63 (!) 124/96  Pulse: 64 75 78 71  Resp: 16 16 16 18   Temp: 98.2 F (36.8 C) 98.9 F (37.2 C) 99 F (37.2 C) 98.1 F (36.7 C)  TempSrc: Oral Oral Oral Oral  SpO2: 100% 100% 100% 100%  Weight:      Height:       No intake or output data in the 24 hours ending 07/06/24 1750 Filed Weights   07/04/24 1621  Weight: 56.7 kg    Exam: General: NAD  Cardiovascular: S1, S2 present Respiratory: CTAB Abdomen: Soft, nontender, nondistended, bowel sounds present Musculoskeletal: No bilateral pedal edema noted Skin: Normal Psychiatry: Normal mood  Data Reviewed: CBC: Recent Labs  Lab 07/04/24 1045 07/05/24 0455  WBC 7.7 7.7  NEUTROABS 4.8   --   HGB 12.4* 13.1  HCT 38.1* 39.8  MCV 91.6 89.8  PLT 271 283   Basic Metabolic Panel: Recent Labs  Lab 07/04/24 0906 07/04/24 1937 07/05/24 0455  NA 135  --  138  K 4.2  --  4.0  CL 102  --  102  CO2 21*  --  27  GLUCOSE 104*  --  105*  BUN 10  --  6*  CREATININE 0.80  --  0.77  CALCIUM  10.1  --  9.7  MG 2.5* 2.0  --   PHOS 3.7 4.6  --    GFR: Estimated Creatinine Clearance: 67.9 mL/min (by C-G formula based on SCr of 0.77 mg/dL). Liver Function Tests: Recent Labs  Lab 07/04/24 0906 07/04/24 1937 07/05/24 0455  AST 43* 26 22  ALT 16 14 12   ALKPHOS 51 41 39  BILITOT 0.3 0.5 0.7  PROT 7.3 6.4* 6.4*  ALBUMIN 4.3 3.3* 3.5   No results for input(s): LIPASE, AMYLASE in the last 168 hours. No results for input(s): AMMONIA in the last 168 hours. Coagulation Profile: No results for input(s): INR, PROTIME in the last 168 hours. Cardiac Enzymes: No results for input(s): CKTOTAL, CKMB, CKMBINDEX, TROPONINI in the last 168 hours. BNP (last 3 results) No results for input(s): PROBNP in the last 8760 hours. HbA1C: No results for input(s): HGBA1C in the last 72 hours. CBG: No results for input(s): GLUCAP in the last 168 hours. Lipid Profile: Recent Labs    07/05/24 0455  CHOL 135  HDL 68  LDLCALC 56  TRIG 54  CHOLHDL 2.0   Thyroid Function Tests: No results for input(s): TSH, T4TOTAL, FREET4, T3FREE, THYROIDAB in the last 72 hours. Anemia Panel: No results for input(s): VITAMINB12, FOLATE, FERRITIN, TIBC, IRON, RETICCTPCT in the last 72 hours. Urine analysis:    Component Value Date/Time   COLORURINE YELLOW 07/24/2018 1555   APPEARANCEUR CLEAR 07/24/2018 1555   LABSPEC 1.013 07/24/2018 1555   PHURINE 7.0 07/24/2018 1555   GLUCOSEU 50 (A) 07/24/2018 1555   HGBUR NEGATIVE 07/24/2018 1555   BILIRUBINUR NEGATIVE 07/24/2018 1555   KETONESUR NEGATIVE 07/24/2018 1555   PROTEINUR NEGATIVE 07/24/2018 1555   NITRITE  NEGATIVE 07/24/2018 1555   LEUKOCYTESUR NEGATIVE 07/24/2018 1555   Sepsis Labs: @LABRCNTIP (procalcitonin:4,lacticidven:4)  )No results found for this or any previous visit (from the past 240 hours).    Studies: ECHOCARDIOGRAM COMPLETE Result Date: 07/05/2024    ECHOCARDIOGRAM REPORT   Patient Name:   Calvin Gardner Date of Exam: 07/05/2024 Medical Rec #:  [id number]         Height:       66.0 in Accession #:    [id number]        Weight:       115.0 lb Date of Birth:  1950/12/18         BSA:          1.581 m Patient Age:    73 years          BP:           118/78 mmHg Patient Gender: M                 HR:           58 bpm. Exam Location:  Inpatient Procedure: 2D Echo, Cardiac Doppler and Color Doppler (Both Spectral and Color  Flow Doppler were utilized during procedure). Indications:    Stroke I63.9  History:        Patient has no prior history of Echocardiogram examinations.                 Stroke, Signs/Symptoms:Chest Pain; Risk Factors:Hypertension and                 Current Smoker.  Sonographer:    Thea Norlander RCS Referring Phys: [id number] [facility] IMPRESSIONS  1. Left ventricular ejection fraction, by estimation, is 55 to 60%. The left ventricle has normal function. The left ventricle has no regional wall motion abnormalities. Left ventricular diastolic parameters were normal.  2. Right ventricular systolic function is normal. The right ventricular size is normal. There is normal pulmonary artery systolic pressure. The estimated right ventricular systolic pressure is 30.1 mmHg.  3. The mitral valve is grossly normal. Trivial mitral valve regurgitation. No evidence of mitral stenosis.  4. The aortic valve is tricuspid. Aortic valve regurgitation is not visualized. No aortic stenosis is present.  5. The inferior vena cava is dilated in size with >50% respiratory variability, suggesting right atrial pressure of 8 mmHg. Conclusion(s)/Recommendation(s): No intracardiac source of  embolism detected on this transthoracic study. Consider a transesophageal echocardiogram to exclude cardiac source of embolism if clinically indicated. FINDINGS  Left Ventricle: Left ventricular ejection fraction, by estimation, is 55 to 60%. The left ventricle has normal function. The left ventricle has no regional wall motion abnormalities. The left ventricular internal cavity size was normal in size. There is  no left ventricular hypertrophy. Left ventricular diastolic parameters were normal. Right Ventricle: The right ventricular size is normal. No increase in right ventricular wall thickness. Right ventricular systolic function is normal. There is normal pulmonary artery systolic pressure. The tricuspid regurgitant velocity is 2.35 m/s, and  with an assumed right atrial pressure of 8 mmHg, the estimated right ventricular systolic pressure is 30.1 mmHg. Left Atrium: Left atrial size was normal in size. Right Atrium: Right atrial size was normal in size. Pericardium: There is no evidence of pericardial effusion. Mitral Valve: The mitral valve is grossly normal. Trivial mitral valve regurgitation. No evidence of mitral valve stenosis. Tricuspid Valve: The tricuspid valve is grossly normal. Tricuspid valve regurgitation is trivial. No evidence of tricuspid stenosis. Aortic Valve: The aortic valve is tricuspid. Aortic valve regurgitation is not visualized. No aortic stenosis is present. Aortic valve peak gradient measures 2.9 mmHg. Pulmonic Valve: The pulmonic valve was grossly normal. Pulmonic valve regurgitation is trivial. No evidence of pulmonic stenosis. Aorta: The aortic root is normal in size and structure. Venous: The inferior vena cava is dilated in size with greater than 50% respiratory variability, suggesting right atrial pressure of 8 mmHg. IAS/Shunts: The atrial septum is grossly normal.  LEFT VENTRICLE PLAX 2D LVIDd:         4.30 cm     Diastology LVIDs:         2.60 cm     LV e' medial:    8.92 cm/s  LV PW:         0.90 cm     LV E/e' medial:  6.3 LV IVS:        0.90 cm     LV e' lateral:   10.20 cm/s LVOT diam:     2.20 cm     LV E/e' lateral: 5.5 LV SV:         56 LV SV Index:   36 LVOT  Area:     3.80 cm LV IVRT:       67 msec  LV Volumes (MOD) LV vol d, MOD A2C: 53.3 ml LV vol d, MOD A4C: 41.8 ml LV vol s, MOD A2C: 17.8 ml LV vol s, MOD A4C: 12.9 ml LV SV MOD A2C:     35.5 ml LV SV MOD A4C:     41.8 ml LV SV MOD BP:      32.4 ml RIGHT VENTRICLE             IVC RV S prime:     10.80 cm/s  IVC diam: 2.30 cm TAPSE (M-mode): 2.3 cm LEFT ATRIUM             Index        RIGHT ATRIUM           Index LA diam:        2.60 cm 1.64 cm/m   RA Area:     14.30 cm LA Vol (A2C):   24.5 ml 15.50 ml/m  RA Volume:   32.90 ml  20.81 ml/m LA Vol (A4C):   24.4 ml 15.44 ml/m LA Biplane Vol: 27.9 ml 17.65 ml/m  AORTIC VALVE AV Area (Vmax): 3.20 cm AV Vmax:        85.10 cm/s AV Peak Grad:   2.9 mmHg LVOT Vmax:      71.60 cm/s LVOT Vmean:     44.600 cm/s LVOT VTI:       0.148 m  AORTA Ao Root diam: 3.20 cm MITRAL VALVE               TRICUSPID VALVE MV Area (PHT): 3.08 cm    TR Peak grad:   22.1 mmHg MV Decel Time: 246 msec    TR Vmax:        235.00 cm/s MV E velocity: 56.10 cm/s MV A velocity: 53.60 cm/s  SHUNTS MV E/A ratio:  1.05        Systemic VTI:  0.15 m                            Systemic Diam: 2.20 cm Darryle Decent MD Electronically signed by Darryle Decent MD Signature Date/Time: 07/05/2024/9:13:34 PM    Final     Scheduled Meds:  aspirin   81 mg Oral Daily   folic acid  1 mg Oral Daily   LORazepam  0-4 mg Oral Q12H   multivitamin with minerals  1 tablet Oral Daily   thiamine  100 mg Oral Daily   Or   thiamine  100 mg Intravenous Daily    Continuous Infusions:   LOS: 2 days     Tyffany Waldrop J Jacklin Zwick, MD [facility]  If 7PM-7AM, please contact night-coverage [email or web address] 07/06/2024, 5:50 PM

## 2024-07-06 NOTE — Progress Notes (Signed)
 Transition of Care [facility]) - Inpatient Brief Assessment   Patient Details  Name: Calvin Gardner MRN: [id number] Date of Birth: 07/24/1951  [facility]) CM/SW Contact:    Rosaline JONELLE Joe, RN Phone Number: 07/06/2024, 11:45 AM   Clinical Narrative: Patient admitted to the hospital from home alone with intracranial hemorrhage.  Patient lives alone and plans to return home when stable.  Patient  has no current DME at the home.  Patient was offered Medicare choice regarding DME company and patient did not have a preference.  I called [employer] and requested delivery of RW to the patient's hospital room today.  Neuro rehabilitation OPPT referral was sent earlier in the week.  Patient is aware.  Patient is current smoker and quit prior to admission.  Resources provided in the AVS for smoking cessation.  Patient states that he was drinking beer and liquor daily prior to admission to the hospital and plans to quit.  Resources provided in the AVS for OP counseling.  Patient states that he will likely take a can home when stable for discharge.  Patient    Transition of Care Asessment: Insurance and Status: (P) Insurance coverage has been reviewed Patient has primary care physician: (P) Yes Home environment has been reviewed: (P) from home alone Prior level of function:: (P) self Prior/Current Home Services: (P) No current home services Social Drivers of Health Review: (P) SDOH reviewed interventions complete Readmission risk has been reviewed: (P) Yes Transition of care needs: (P) transition of care needs identified, TOC will continue to follow

## 2024-07-06 NOTE — Progress Notes (Signed)
 STROKE TEAM PROGRESS NOTE    INTERIM HISTORY/SUBJECTIVE No family at the bedside. Patient states he has been intermittent visual hallucinations as well as some gait difficulties and frequent falls.  He has remote history of hepatocellular carcinoma treated with chemotherapy in 2019 and has been in remission.  He has been losing weight last few months.  Reviewed MRI films with Dr. Jama Hurst neuroradiologist who agrees with unusual MRI findings could represent paraneoplastic syndrome or immunotherapy related inflammation.  Infiltrative neoplasm like lymphoma is less likely. MRI with contrast shows no  enhancement of the right subcortical lesion CBC    Component Value Date/Time   WBC 7.7 07/05/2024 0455   RBC 4.43 07/05/2024 0455   HGB 13.1 07/05/2024 0455   HGB 13.5 06/12/2019 1156   HGB 13.0 05/16/2018 1219   HCT 39.8 07/05/2024 0455   HCT 39.1 05/16/2018 1219   PLT 283 07/05/2024 0455   PLT 266 06/12/2019 1156   PLT 199 05/16/2018 1219   MCV 89.8 07/05/2024 0455   MCV 91 05/16/2018 1219   MCH 29.6 07/05/2024 0455   MCHC 32.9 07/05/2024 0455   RDW 13.0 07/05/2024 0455   RDW 12.1 (L) 05/16/2018 1219   LYMPHSABS 1.6 07/04/2024 1045   LYMPHSABS 1.0 05/16/2018 1219   MONOABS 1.0 07/04/2024 1045   EOSABS 0.3 07/04/2024 1045   EOSABS 0.0 05/16/2018 1219   BASOSABS 0.1 07/04/2024 1045   BASOSABS 0.1 05/16/2018 1219    BMET    Component Value Date/Time   NA 138 07/05/2024 0455   NA 132 (L) 04/13/2024 1016   K 4.0 07/05/2024 0455   CL 102 07/05/2024 0455   CO2 27 07/05/2024 0455   GLUCOSE 105 (H) 07/05/2024 0455   BUN 6 (L) 07/05/2024 0455   BUN 11 04/13/2024 1016   CREATININE 0.77 07/05/2024 0455   CREATININE 0.86 08/12/2020 0759   CALCIUM  9.7 07/05/2024 0455   EGFR 73 04/13/2024 1016   GFRNONAA >60 07/05/2024 0455   GFRNONAA 90 08/12/2020 0759    IMAGING past 24 hours ECHOCARDIOGRAM COMPLETE Result Date: 07/05/2024    ECHOCARDIOGRAM REPORT   Patient Name:   Calvin Gardner Date of Exam: 07/05/2024 Medical Rec #:  [id number]         Height:       66.0 in Accession #:    [id number]        Weight:       115.0 lb Date of Birth:  1951/06/16         BSA:          1.581 m Patient Age:    73 years          BP:           118/78 mmHg Patient Gender: M                 HR:           58 bpm. Exam Location:  Inpatient Procedure: 2D Echo, Cardiac Doppler and Color Doppler (Both Spectral and Color            Flow Doppler were utilized during procedure). Indications:    Stroke I63.9  History:        Patient has no prior history of Echocardiogram examinations.                 Stroke, Signs/Symptoms:Chest Pain; Risk Factors:Hypertension and                 Current  Smoker.  Sonographer:    Thea Norlander RCS Referring Phys: [id number] [facility] IMPRESSIONS  1. Left ventricular ejection fraction, by estimation, is 55 to 60%. The left ventricle has normal function. The left ventricle has no regional wall motion abnormalities. Left ventricular diastolic parameters were normal.  2. Right ventricular systolic function is normal. The right ventricular size is normal. There is normal pulmonary artery systolic pressure. The estimated right ventricular systolic pressure is 30.1 mmHg.  3. The mitral valve is grossly normal. Trivial mitral valve regurgitation. No evidence of mitral stenosis.  4. The aortic valve is tricuspid. Aortic valve regurgitation is not visualized. No aortic stenosis is present.  5. The inferior vena cava is dilated in size with >50% respiratory variability, suggesting right atrial pressure of 8 mmHg. Conclusion(s)/Recommendation(s): No intracardiac source of embolism detected on this transthoracic study. Consider a transesophageal echocardiogram to exclude cardiac source of embolism if clinically indicated. FINDINGS  Left Ventricle: Left ventricular ejection fraction, by estimation, is 55 to 60%. The left ventricle has normal function. The left ventricle has no regional wall  motion abnormalities. The left ventricular internal cavity size was normal in size. There is  no left ventricular hypertrophy. Left ventricular diastolic parameters were normal. Right Ventricle: The right ventricular size is normal. No increase in right ventricular wall thickness. Right ventricular systolic function is normal. There is normal pulmonary artery systolic pressure. The tricuspid regurgitant velocity is 2.35 m/s, and  with an assumed right atrial pressure of 8 mmHg, the estimated right ventricular systolic pressure is 30.1 mmHg. Left Atrium: Left atrial size was normal in size. Right Atrium: Right atrial size was normal in size. Pericardium: There is no evidence of pericardial effusion. Mitral Valve: The mitral valve is grossly normal. Trivial mitral valve regurgitation. No evidence of mitral valve stenosis. Tricuspid Valve: The tricuspid valve is grossly normal. Tricuspid valve regurgitation is trivial. No evidence of tricuspid stenosis. Aortic Valve: The aortic valve is tricuspid. Aortic valve regurgitation is not visualized. No aortic stenosis is present. Aortic valve peak gradient measures 2.9 mmHg. Pulmonic Valve: The pulmonic valve was grossly normal. Pulmonic valve regurgitation is trivial. No evidence of pulmonic stenosis. Aorta: The aortic root is normal in size and structure. Venous: The inferior vena cava is dilated in size with greater than 50% respiratory variability, suggesting right atrial pressure of 8 mmHg. IAS/Shunts: The atrial septum is grossly normal.  LEFT VENTRICLE PLAX 2D LVIDd:         4.30 cm     Diastology LVIDs:         2.60 cm     LV e' medial:    8.92 cm/s LV PW:         0.90 cm     LV E/e' medial:  6.3 LV IVS:        0.90 cm     LV e' lateral:   10.20 cm/s LVOT diam:     2.20 cm     LV E/e' lateral: 5.5 LV SV:         56 LV SV Index:   36 LVOT Area:     3.80 cm LV IVRT:       67 msec  LV Volumes (MOD) LV vol d, MOD A2C: 53.3 ml LV vol d, MOD A4C: 41.8 ml LV vol s, MOD A2C:  17.8 ml LV vol s, MOD A4C: 12.9 ml LV SV MOD A2C:     35.5 ml LV SV MOD A4C:     41.8  ml LV SV MOD BP:      32.4 ml RIGHT VENTRICLE             IVC RV S prime:     10.80 cm/s  IVC diam: 2.30 cm TAPSE (M-mode): 2.3 cm LEFT ATRIUM             Index        RIGHT ATRIUM           Index LA diam:        2.60 cm 1.64 cm/m   RA Area:     14.30 cm LA Vol (A2C):   24.5 ml 15.50 ml/m  RA Volume:   32.90 ml  20.81 ml/m LA Vol (A4C):   24.4 ml 15.44 ml/m LA Biplane Vol: 27.9 ml 17.65 ml/m  AORTIC VALVE AV Area (Vmax): 3.20 cm AV Vmax:        85.10 cm/s AV Peak Grad:   2.9 mmHg LVOT Vmax:      71.60 cm/s LVOT Vmean:     44.600 cm/s LVOT VTI:       0.148 m  AORTA Ao Root diam: 3.20 cm MITRAL VALVE               TRICUSPID VALVE MV Area (PHT): 3.08 cm    TR Peak grad:   22.1 mmHg MV Decel Time: 246 msec    TR Vmax:        235.00 cm/s MV E velocity: 56.10 cm/s MV A velocity: 53.60 cm/s  SHUNTS MV E/A ratio:  1.05        Systemic VTI:  0.15 m                            Systemic Diam: 2.20 cm Darryle Decent MD Electronically signed by Darryle Decent MD Signature Date/Time: 07/05/2024/9:13:34 PM    Final    MR BRAIN W CONTRAST Result Date: 07/05/2024 EXAM: MRI BRAIN WITH CONTRAST 07/05/2024 12:55:39 PM TECHNIQUE: Multiplanar multisequence MRI of the head/brain was performed with the administration of intravenous contrast. COMPARISON: MR Head without contrast 07/04/2024. CLINICAL HISTORY: Possible PML or demyelinating disease. FINDINGS: No abnormal brain parenchymal or meningeal enhancement is identified, including within the region of signal abnormality involving the right temporal lobe and internal capsule/posterior basal ganglia region. Further assessment of the brain is deferred to yesterday's complete noncontrast MRI. IMPRESSION: 1. No abnormal intracranial enhancement. Electronically signed by: Dasie Hamburg MD 07/05/2024 03:29 PM EST RP Workstation: [id number]    Vitals:   07/05/24 2339 07/06/24 0455 07/06/24 0716  07/06/24 1106  BP: 113/73 119/78 108/75 103/63  Pulse: 64 64 75 78  Resp: 15 16 16 16   Temp: 98.6 F (37 C) 98.2 F (36.8 C) 98.9 F (37.2 C) 99 F (37.2 C)  TempSrc: Oral Oral Oral Oral  SpO2: 100% 100% 100% 100%  Weight:      Height:         PHYSICAL EXAM General:  Alert, well-nourished, well-developed patient in no acute distress Psych:  Mood and affect appropriate for situation CV: Regular rate and rhythm on monitor Respiratory:  Regular, unlabored respirations on room air GI: Abdomen soft and nontender   NEURO:  Mental Status: AA&Ox3, patient is able to give clear and coherent history Speech/Language: speech is without dysarthria or aphasia.  Naming, repetition, fluency, and comprehension intact.  Cranial Nerves:  II: PERRL. Visual fields with left hemianopia  III, IV, VI: EOMI. Eyelids elevate symmetrically.  V: Sensation is intact to light touch and symmetrical to face.  VII: Face is symmetrical resting and smiling VIII: hearing intact to voice. IX, X: Palate elevates symmetrically. Phonation is normal.  KP:Dynloizm shrug 5/5. XII: tongue is midline without fasciculations. Motor: 5/5 strength to all muscle groups tested.  Trace weakness left and finger movements in the left hand.  RIGHT to left extremity.  Symmetric grip strength laterally. Tone: is normal and bulk is normal Sensation- Intact to light touch bilaterally. Extinction absent to light touch to DSS.   Coordination: FTN intact bilaterally, HKS: no ataxia in BLE.No drift. Decreased fine finger movements on left hand  Deep tendon reflexes brisker on the left compared to the right. Gait- deferred   ASSESSMENT/PLAN  Calvin Gardner is a 73 y.o. male with history of alcohol abuse, aortic atherosclerosis, arthritis, chronic hepatitis C, liver cancer, cataract, HLD, HTN, pulmonary nodule and substance abuse who presented to the ED this morning with symptoms of intermittent headache and blurred vision for  2 weeks. During that time period, he experienced some falls and also frequently was running into things with the left side of his body.  Possible subacute  Ischemic Infarct:  right MCA versus underlying structural lesion ?  Paraneoplastic versus infectious Etiology:  workup pending   CT head No acute abnormality.  CTA head & neck: 1. Plaque in the right carotid bulb with less than 50% stenosis 2. Calcified plaque in the left carotid bulb without significant stenosis MRI: 1. Patchy and indistinct combined acute and subacute ischemia in the posterior Right internal capsule, adjacent deep gray nuclei and temporal lobe white matter. No hemorrhagic transformation or mass effect. 2. 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 3. 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 MRI brain w contrast no abnormal enhancement 2D Echo ejection fraction 55 to 60%.  Left atrial size normal LDL 56 HgbA1c pending  VTE prophylaxis - SCD's No antithrombotic prior to admission, now on aspirin  81 mg daily and clopidogrel 75 mg daily for 3 weeks and then ASA alone. Therapy recommendations:  Pending Disposition:  pending   Hypertension Home meds:  amlodipine  5mg   Stable Blood Pressure Goal: SBP less than 160   Hyperlipidemia Home meds:  atorvastatin  20mg , resumed in hospital LDL 56, goal < 70 Continue statin at discharge  Tobacco Abuse Patient smokes 10-15 cigarettes per day for years      Ready to quit? No Nicotine  replacement therapy provided  ETOH abuse  CIWA protocol  Drinking 96 to 120 ounces of beer daily   ETOH use advised to drink no more than 2 drink(s) a day Folate and thiamine daily   Dysphagia Patient has post-stroke dysphagia, SLP consulted    Diet   Diet Heart Room service appropriate? Yes; Fluid consistency: Thin   Advance diet as tolerated  Other Active  Problems History of hep C/liver carcinoma   Hospital day # 2    Patient presented with subacute symptoms of gait difficulty and leaning to 1 side with hallucinations without definite symptoms suggestive of right brain stroke with MRI scan showing mild diffusion hyperintensity in the right temporal lobe and posterior capsule white matter extending up to the top of the brainstem not in a typical vascular distribution.  These findings could be from underlying structural lesion like lymphoma or paraneoplastic or infectious conditions given his immunocompromise status from hepatocellular carcinoma  which has been treated previously with chemotherapy.  Recommend hold Plavix.  Check P2 Y12 level.  Will need diagnostic spinal tap look for autoimmune and paraneoplastic etiology and lymphoma Discussed with Dr. Jama Hurst neuroradiologist.     I personally spent a total of 35 minutes in the care of the patient today including getting/reviewing separately obtained history, performing a medically appropriate exam/evaluation, counseling and educating, placing orders, referring and communicating with other health care professionals, documenting clinical information in the EHR, independently interpreting results, and coordinating care.        Eather Popp, MD Medical Director [facility] Stroke Center Pager: [phone] 07/06/2024 12:37 PM   To contact Stroke Continuity provider, please refer to [email or web address]. After hours, contact General Neurology

## 2024-07-06 NOTE — Progress Notes (Signed)
 Routine EEG completed, results pending Neurology review and interpretation

## 2024-07-06 NOTE — Procedures (Signed)
 Patient Name: RIHAAN BARRACK  MRN: [id number]  Epilepsy Attending: Arlin MALVA Krebs  Referring Physician/Provider: Waddell Karna LABOR, NP  Date: 07/06/2024 Duration: 28.02 mins  Patient history: 73yo M wiith gait difficulty and leaning to 1 side with hallucinations. EEG to evaluate for seizure.  Level of alertness: Awake  AEDs during EEG study: None  Technical aspects: This EEG study was done with scalp electrodes positioned according to the 10-20 International system of electrode placement. Electrical activity was reviewed with band pass filter of 1-70Hz , sensitivity of 7 uV/mm, display speed of 62mm/sec with a 60Hz  notched filter applied as appropriate. EEG data were recorded continuously and digitally stored.  Video monitoring was available and reviewed as appropriate.  Description: The posterior dominant rhythm consists of 7 Hz activity of moderate voltage (25-35 uV) seen predominantly in posterior head regions, symmetric and reactive to eye opening and eye closing. EEG showed continuous generalized and maximal left frontotemporal 3 to 6 Hz theta-delta slowing. Hyperventilation and photic stimulation were not performed.     ABNORMALITY - Continuous slow, generalized and maximal left frontotemporal  IMPRESSION: This study is suggestive of cortical dysfunction arising from left frontotemporal region likely secondary to underlying structural abnormality. Additionally there is generalized cerebral dysfunction (encephalopathy). No seizures or epileptiform discharges were seen throughout the recording.  Crew Goren O Yalanda Soderman

## 2024-07-07 DIAGNOSIS — I639 Cerebral infarction, unspecified: Secondary | ICD-10-CM | POA: Diagnosis not present

## 2024-07-07 LAB — HEMOGLOBIN A1C
Hgb A1c MFr Bld: 5.3 % (ref 4.8–5.6)
Mean Plasma Glucose: 105 mg/dL

## 2024-07-07 MED ORDER — ENSURE PLUS HIGH PROTEIN PO LIQD
237.0000 mL | Freq: Two times a day (BID) | ORAL | Status: DC
  Administered 2024-07-07 – 2024-07-15 (×11): 237 mL via ORAL

## 2024-07-07 NOTE — Progress Notes (Signed)
 STROKE TEAM PROGRESS NOTE    INTERIM HISTORY/SUBJECTIVE No family at the bedside. Patient appears frustrated and states he wants to go home.  P2 Y12 level was suppressed and she will have to wait at least till tomorrow to do spinal tap   CBC    Component Value Date/Time   WBC 7.7 07/05/2024 0455   RBC 4.43 07/05/2024 0455   HGB 13.1 07/05/2024 0455   HGB 13.5 06/12/2019 1156   HGB 13.0 05/16/2018 1219   HCT 39.8 07/05/2024 0455   HCT 39.1 05/16/2018 1219   PLT 283 07/05/2024 0455   PLT 266 06/12/2019 1156   PLT 199 05/16/2018 1219   MCV 89.8 07/05/2024 0455   MCV 91 05/16/2018 1219   MCH 29.6 07/05/2024 0455   MCHC 32.9 07/05/2024 0455   RDW 13.0 07/05/2024 0455   RDW 12.1 (L) 05/16/2018 1219   LYMPHSABS 1.6 07/04/2024 1045   LYMPHSABS 1.0 05/16/2018 1219   MONOABS 1.0 07/04/2024 1045   EOSABS 0.3 07/04/2024 1045   EOSABS 0.0 05/16/2018 1219   BASOSABS 0.1 07/04/2024 1045   BASOSABS 0.1 05/16/2018 1219    BMET    Component Value Date/Time   NA 138 07/05/2024 0455   NA 132 (L) 04/13/2024 1016   K 4.0 07/05/2024 0455   CL 102 07/05/2024 0455   CO2 27 07/05/2024 0455   GLUCOSE 105 (H) 07/05/2024 0455   BUN 6 (L) 07/05/2024 0455   BUN 11 04/13/2024 1016   CREATININE 0.77 07/05/2024 0455   CREATININE 0.86 08/12/2020 0759   CALCIUM  9.7 07/05/2024 0455   EGFR 73 04/13/2024 1016   GFRNONAA >60 07/05/2024 0455   GFRNONAA 90 08/12/2020 0759    IMAGING past 24 hours EEG adult Result Date: 07/06/2024 Calvin Calvin KIDD, MD     07/06/2024  7:12 PM Patient Name: Calvin Gardner MRN: [id number] Epilepsy Attending: Arlin Gardner Calvin Referring Physician/Provider: Waddell Gardner LABOR, NP Date: 07/06/2024 Duration: 28.02 mins Patient history: 74yo M wiith gait difficulty and leaning to 1 side with hallucinations. EEG to evaluate for seizure. Level of alertness: Awake AEDs during EEG study: None Technical aspects: This EEG study was done with scalp electrodes positioned according to the  10-20 International system of electrode placement. Electrical activity was reviewed with band pass filter of 1-70Hz , sensitivity of 7 uV/mm, display speed of 60mm/sec with a 60Hz  notched filter applied as appropriate. EEG data were recorded continuously and digitally stored.  Video monitoring was available and reviewed as appropriate. Description: The posterior dominant rhythm consists of 7 Hz activity of moderate voltage (25-35 uV) seen predominantly in posterior head regions, symmetric and reactive to eye opening and eye closing. EEG showed continuous generalized and maximal left frontotemporal 3 to 6 Hz theta-delta slowing. Hyperventilation and photic stimulation were not performed.   ABNORMALITY - Continuous slow, generalized and maximal left frontotemporal IMPRESSION: This study is suggestive of cortical dysfunction arising from left frontotemporal region likely secondary to underlying structural abnormality. Additionally there is generalized cerebral dysfunction (encephalopathy). No seizures or epileptiform discharges were seen throughout the recording. Calvin Gardner Calvin    Vitals:   07/07/24 0331 07/07/24 0855 07/07/24 1142 07/07/24 1233  BP: 113/70 116/66 127/76 109/70  Pulse: (!) 59 66 78 74  Resp: 16 18 18    Temp: 98.2 F (36.8 C) 98.5 F (36.9 C) 98.2 F (36.8 C)   TempSrc:  Oral Oral   SpO2: 100% 100% 100% 100%  Weight:      Height:  PHYSICAL EXAM General:  Alert, well-nourished, well-developed patient in no acute distress Psych:  Mood and affect appropriate for situation CV: Regular rate and rhythm on monitor Respiratory:  Regular, unlabored respirations on room air GI: Abdomen soft and nontender   NEURO:  Mental Status: AA&Ox3, patient is able to give clear and coherent history Speech/Language: speech is without dysarthria or aphasia.  Naming, repetition, fluency, and comprehension intact.  Cranial Nerves:  II: PERRL. Visual fields with left hemianopia  III, IV,  VI: EOMI. Eyelids elevate symmetrically.  V: Sensation is intact to light touch and symmetrical to face.  VII: Face is symmetrical resting and smiling VIII: hearing intact to voice. IX, X: Palate elevates symmetrically. Phonation is normal.  KP:Dynloizm shrug 5/5. XII: tongue is midline without fasciculations. Motor: 5/5 strength to all muscle groups tested.  Trace weakness left and finger movements in the left hand.  RIGHT to left extremity.  Symmetric grip strength laterally. Tone: is normal and bulk is normal Sensation- Intact to light touch bilaterally. Extinction absent to light touch to DSS.   Coordination: FTN intact bilaterally, HKS: no ataxia in BLE.No drift. Decreased fine finger movements on left hand  Deep tendon reflexes brisker on the left compared to the right. Gait- deferred   ASSESSMENT/PLAN  Mr. Calvin Gardner is a 73 y.o. male with history of alcohol abuse, aortic atherosclerosis, arthritis, chronic hepatitis C, liver cancer, cataract, HLD, HTN, pulmonary nodule and substance abuse who presented to the ED this morning with symptoms of intermittent headache and blurred vision for 2 weeks. During that time period, he experienced some falls and also frequently was running into things with the left side of his body.  Possible subacute  Ischemic Infarct:  right MCA versus underlying structural lesion ?  Paraneoplastic versus infectious Etiology:  workup pending   CT head No acute abnormality.  CTA head & neck: 1. Plaque in the right carotid bulb with less than 50% stenosis 2. Calcified plaque in the left carotid bulb without significant stenosis MRI: 1. Patchy and indistinct combined acute and subacute ischemia in the posterior Right internal capsule, adjacent deep gray nuclei and temporal lobe white matter. No hemorrhagic transformation or mass effect. 2. 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 3. Moderate signal  abnormality in the pons which could be chronic small vessel disease related vs due to previous myelinolysis. MRI brain w contrast no abnormal enhancement 2D Echo ejection fraction 55 to 60%.  Left atrial size normal LDL 56 HgbA1c pending  VTE prophylaxis - SCD's No antithrombotic prior to admission, now on  ASA alone. Therapy recommendations:  Pending Disposition:  pending   Hypertension Home meds:  amlodipine  5mg   Stable Blood Pressure Goal: SBP less than 160   Hyperlipidemia Home meds:  atorvastatin  20mg , resumed in hospital LDL 56, goal < 70 Continue statin at discharge  Tobacco Abuse Patient smokes 10-15 cigarettes per day for years      Ready to quit? No Nicotine  replacement therapy provided  ETOH abuse  CIWA protocol  Drinking 96 to 120 ounces of beer daily   ETOH use advised to drink no more than 2 drink(s) a day Folate and thiamine  daily   Dysphagia Patient has post-stroke dysphagia, SLP consulted    Diet   Diet Heart Room service appropriate? Yes; Fluid consistency: Thin   Advance diet as tolerated  Other Active Problems History of hep C/liver carcinoma   Hospital day # 3   Patient  presented with subacute symptoms of gait difficulty and leaning to 1 side with hallucinations without definite symptoms suggestive of right brain stroke with MRI scan showing mild diffusion hyperintensity in the right temporal lobe and posterior capsule white matter extending up to the top of the brainstem not in a typical vascular distribution.  These findings could be from underlying structural lesion like lymphoma or paraneoplastic or infectious conditions given his immunocompromise status from hepatocellular carcinoma which has been treated previously with chemotherapy.  Recommend hold Plavix .  P2 Y12 levels were low hence we will wait at least till 07/08/2024 to do diagnostic spinal tap and send fluid for checking for autoimmune and infectious etiologies.  Discussed with Dr.  Vanessa neurohospitalist service who will follow the patient Stroke team will sign off.         Eather Popp, MD Medical Director [facility] Stroke Center Pager: [phone] 07/07/2024 2:40 PM   To contact Stroke Continuity provider, please refer to [email or web address]. After hours, contact General Neurology

## 2024-07-07 NOTE — TOC CAGE-AID Note (Signed)
 Transition of Care (TOC) - CAGE-AID Screening CAGE-Aid screening completed and resources provided in the AVS for patient to follow up regarding Outpatient counseling.  Patient Details  Name: Calvin Gardner MRN: [id number] Date of Birth: [birth year]  Transition of Care [facility]) CM/SW Contact:    Rosaline JONELLE Joe, RN Phone Number: 07/07/2024, 8:42 AM   Clinical Narrative:    CAGE-AID Screening:    Have You Ever Felt You Ought to Cut Down on Your Drinking or Drug Use?: Yes Have People Annoyed You By Critizing Your Drinking Or Drug Use?: No Have You Felt Bad Or Guilty About Your Drinking Or Drug Use?: No Have You Ever Had a Drink or Used Drugs First Thing In The Morning to Steady Your Nerves or to Get Rid of a Hangover?: No CAGE-AID Score: 1  Substance Abuse Education Offered: Yes  Substance abuse interventions: Patient Counseling, Educational Materials

## 2024-07-07 NOTE — Progress Notes (Signed)
 Physical Therapy Treatment Patient Details Name: Calvin Gardner MRN: [id number] DOB: Apr 06, 1951 Today's Date: 07/07/2024   History of Present Illness 73 yo male presented 12/2 HA, blurred vision, dizziness and falls CT (+) acute territorial infarct, MRI (+) acute subacute ischemia in posterior R internal capsule, temporal lobe, small subacute infarct in R MCA motor strip PMH ETOH abuse, aortic atherosclerosis, OA, Hep C, liver CA, cataract,HLD, HTN, Pulmonary nodule, substance abuse, smoker   PT Comments  Pt received in bed and agreeable to PT session. Pt continues to be unsteady when ambulating with no AD with multiple small losses of balance. Discussed need for RW to improve stability and reduce fall risk with pt verbalizing understanding. Worked on navigating in the hallway and room with RW with cues for safety and RW proximity. Pt initially had difficulty turning and kept one leg outside of the RW. Able to improve with cues and repetition. Also worked on folding RW in standing with inability to fold L handle, case manager notified. Pt feels comfortable d/c home when medically stable. Acute PT to follow.     If plan is discharge home, recommend the following: Assist for transportation;Help with stairs or ramp for entrance;A little help with walking and/or transfers;A little help with bathing/dressing/bathroom   Can travel by private vehicle      Yes  Equipment Recommendations  Rolling walker (2 wheels)       Precautions / Restrictions Precautions Precautions: Fall Recall of Precautions/Restrictions: Intact Restrictions Weight Bearing Restrictions Per Provider Order: No     Mobility  Bed Mobility Overal bed mobility: Independent   Transfers Overall transfer level: Modified independent Equipment used: None     Ambulation/Gait Ambulation/Gait assistance: Supervision, Min assist Gait Distance (Feet): 700 Feet Assistive device: Rolling walker (2 wheels), None Gait  Pattern/deviations: Step-through pattern, Decreased stride length, Drifts right/left Gait velocity: decr     General Gait Details: with no AD, pt would have lateral losses of balance requiring MinA to correct. Steady with use of RW with supervision for safety. Cues to stay inside RW when turning with multiple repetitions for practice  Modified Rankin (Stroke Patients Only) Modified Rankin (Stroke Patients Only) Pre-Morbid Rankin Score: No symptoms Modified Rankin: Moderately severe disability     Balance Overall balance assessment: Needs assistance Sitting-balance support: No upper extremity supported, Feet supported Sitting balance-Leahy Scale: Good Sitting balance - Comments: sitting EOB   Standing balance support: No upper extremity supported, Single extremity supported, During functional activity Standing balance-Leahy Scale: Fair Standing balance comment: few lateral LOB during mobility, using rails in hallway to steady self      Communication Communication Communication: No apparent difficulties  Cognition Arousal: Alert Behavior During Therapy: WFL for tasks assessed/performed, Impulsive   PT - Cognitive impairments: No family/caregiver present to determine baseline, Awareness, Attention, Sequencing, Safety/Judgement    Following commands: Impaired      Cueing Cueing Techniques: Verbal cues, Tactile cues, Visual cues         Pertinent Vitals/Pain Pain Assessment Pain Assessment: No/denies pain     PT Goals (current goals can now be found in the care plan section) Acute Rehab PT Goals PT Goal Formulation: With patient Time For Goal Achievement: 07/19/24 Potential to Achieve Goals: Good Progress towards PT goals: Progressing toward goals    Frequency    Min 2X/week       AM-PAC PT 6 Clicks Mobility   Outcome Measure  Help needed turning from your back to your side while in a flat  bed without using bedrails?: None Help needed moving from lying on  your back to sitting on the side of a flat bed without using bedrails?: None Help needed moving to and from a bed to a chair (including a wheelchair)?: A Little Help needed standing up from a chair using your arms (e.g., wheelchair or bedside chair)?: None Help needed to walk in hospital room?: A Little Help needed climbing 3-5 steps with a railing? : A Little 6 Click Score: 21    End of Session Equipment Utilized During Treatment: Gait belt Activity Tolerance: Patient tolerated treatment well Patient left: in bed;with call bell/phone within reach;with bed alarm set Nurse Communication: Mobility status PT Visit Diagnosis: Unsteadiness on feet (R26.81);Other abnormalities of gait and mobility (R26.89)     Time: 9195-9179 PT Time Calculation (min) (ACUTE ONLY): 16 min  Charges:    $Gait Training: 8-22 mins PT General Charges $$ ACUTE PT VISIT: 1 Visit                    Kate ORN, PT, DPT Secure Chat Preferred  Rehab Office [phone]   Kate BRAVO Wendolyn 07/07/2024, 10:07 AM

## 2024-07-07 NOTE — Care Management Important Message (Signed)
 Important Message  Patient Details  Name: Calvin Gardner MRN: [id number] Date of Birth: 11/02/1950   Important Message Given:  Yes - Medicare IM     Claretta Deed 07/07/2024, 3:04 PM

## 2024-07-07 NOTE — Progress Notes (Signed)
 PROGRESS NOTE  GIACOMO VALONE FMW:Gardner DOB: 23-Sep-1950 DOA: 07/04/2024 PCP: Delbert Clam, MD  HPI/Recap of past 24 hours: Calvin Gardner is a 73 y.o. male with medical history significant of alcoholism, history of hep C, liver cancer, GERD, hyperlipidemia, hypertension, pulmonary nodule, substance abuse who presented to the emergency department with complaints of an intermittent headache associated with blurred vision, dizziness and falls with reported injuries that started about 2 weeks ago. CT head showed no acute intracranial abnormality. MRI of the brain showed patchy and indistinct combined acute and subacute ischemia in the posterior right internal capsule, adjacent deep gray nuclei and temporal lobe white matter. 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  Neurology consulted, patient admitted for further management.    Denies any new complaints. Eager to be discharged.    Assessment/Plan: Principal Problem:   Acute cerebrovascular accident (CVA) (HCC) Active Problems:   Essential hypertension, benign   Tobacco abuse   IFG (impaired fasting glucose)   Alcohol abuse   GERD (gastroesophageal reflux disease)   Pulmonary nodule   Acute and subacute CVA MRI brain revealed acute and subacute right MCA territory strokes involving the posterior Right internal capsule, adjacent deep gray nuclei and temporal lobe white matter CTA head and neck with no significant vertebrobasilar or intracranial disease MRI brain with contrast, showed no abnormal intracranial enhancement Neurology/stroke team on board, reports initial MRI findings could be from underlying structural lesions like lymphoma or paraneoplastic or infectious conditions given his immunocompromise state from history of hepatocellular carcinoma treated with chemotherapy.  Neurology discussed case with neuroradiologist for possible diagnostic spinal tap to look for  autoimmune/paraneoplastic etiology versus lymphoma Echo showed EF of 55 to 60%, no regional wall motion abnormality, left ventricular diastolic parameters normal LDL 56, A1c 5.3 Continue aspirin , hold Plavix  for possible spinal tap PT/OT/SLP, recommend outpatient PT Frequent neurochecks  Essential hypertension BP stable Holding PTA amlodipine    Tobacco abuse Tobacco cessation advised. Nicotine  replacement therapy ordered.   Alcohol abuse Drinking 96 to 120 ounces of beer daily CIWA protocol with lorazepam . Magnesium sulfate supplementation. Folate, MVI and thiamine . Consult TOC team  History of hep C/liver carcinoma Outpatient follow-up  Pulmonary nodule Follow-up with PCP for pulmonary referral as an outpatient      Estimated body mass index is 20.16 kg/m as calculated from the following:   Height as of this encounter: 5' 6 (1.676 m).   Weight as of this encounter: 56.7 kg.     Code Status: Full  Family Communication: None at bedside  Disposition Plan: Status is: Inpatient Remains inpatient appropriate because: Level of care      Consultants: Neurology  Procedures: None  Antimicrobials: None  DVT prophylaxis:  SCDs   Objective: Vitals:   07/07/24 0855 07/07/24 1142 07/07/24 1233 07/07/24 1554  BP: 116/66 127/76 109/70 131/72  Pulse: 66 78 74 85  Resp: 18 18  18   Temp: 98.5 F (36.9 C) 98.2 F (36.8 C)  99.1 F (37.3 C)  TempSrc: Oral Oral  Oral  SpO2: 100% 100% 100% 100%  Weight:      Height:        Intake/Output Summary (Last 24 hours) at 07/07/2024 1556 Last data filed at 07/06/2024 2015 Gross per 24 hour  Intake 240 ml  Output --  Net 240 ml   Filed Weights   07/04/24 1621  Weight: 56.7 kg    Exam: General: NAD  Cardiovascular: S1, S2 present Respiratory:  CTAB Abdomen: Soft, nontender, nondistended, bowel sounds present Musculoskeletal: No bilateral pedal edema noted Skin: Normal Psychiatry: Normal mood     Data  Reviewed: CBC: Recent Labs  Lab 07/04/24 1045 07/05/24 0455  WBC 7.7 7.7  NEUTROABS 4.8  --   HGB 12.4* 13.1  HCT 38.1* 39.8  MCV 91.6 89.8  PLT 271 283   Basic Metabolic Panel: Recent Labs  Lab 07/04/24 0906 07/04/24 1937 07/05/24 0455  NA 135  --  138  K 4.2  --  4.0  CL 102  --  102  CO2 21*  --  27  GLUCOSE 104*  --  105*  BUN 10  --  6*  CREATININE 0.80  --  0.77  CALCIUM  10.1  --  9.7  MG 2.5* 2.0  --   PHOS 3.7 4.6  --    GFR: Estimated Creatinine Clearance: 67.9 mL/min (by C-G formula based on SCr of 0.77 mg/dL). Liver Function Tests: Recent Labs  Lab 07/04/24 0906 07/04/24 1937 07/05/24 0455  AST 43* 26 22  ALT 16 14 12   ALKPHOS 51 41 39  BILITOT 0.3 0.5 0.7  PROT 7.3 6.4* 6.4*  ALBUMIN 4.3 3.3* 3.5   No results for input(s): LIPASE, AMYLASE in the last 168 hours. No results for input(s): AMMONIA in the last 168 hours. Coagulation Profile: No results for input(s): INR, PROTIME in the last 168 hours. Cardiac Enzymes: No results for input(s): CKTOTAL, CKMB, CKMBINDEX, TROPONINI in the last 168 hours. BNP (last 3 results) No results for input(s): PROBNP in the last 8760 hours. HbA1C: No results for input(s): HGBA1C in the last 72 hours. CBG: No results for input(s): GLUCAP in the last 168 hours. Lipid Profile: Recent Labs    07/05/24 0455  CHOL 135  HDL 68  LDLCALC 56  TRIG 54  CHOLHDL 2.0   Thyroid Function Tests: No results for input(s): TSH, T4TOTAL, FREET4, T3FREE, THYROIDAB in the last 72 hours. Anemia Panel: No results for input(s): VITAMINB12, FOLATE, FERRITIN, TIBC, IRON, RETICCTPCT in the last 72 hours. Urine analysis:    Component Value Date/Time   COLORURINE YELLOW 07/24/2018 1555   APPEARANCEUR CLEAR 07/24/2018 1555   LABSPEC 1.013 07/24/2018 1555   PHURINE 7.0 07/24/2018 1555   GLUCOSEU 50 (A) 07/24/2018 1555   HGBUR NEGATIVE 07/24/2018 1555   BILIRUBINUR NEGATIVE  07/24/2018 1555   KETONESUR NEGATIVE 07/24/2018 1555   PROTEINUR NEGATIVE 07/24/2018 1555   NITRITE NEGATIVE 07/24/2018 1555   LEUKOCYTESUR NEGATIVE 07/24/2018 1555   Sepsis Labs: @LABRCNTIP (procalcitonin:4,lacticidven:4)  )No results found for this or any previous visit (from the past 240 hours).    Studies: EEG adult Result Date: 07/06/2024 Calvin Calvin KIDD, MD     07/06/2024  7:12 PM Patient Name: Calvin Gardner Epilepsy Attending: Arlin Gardner Calvin Referring Physician/Provider: Waddell Gardner LABOR, NP Date: 07/06/2024 Duration: 28.02 mins Patient history: 73yo M wiith gait difficulty and leaning to 1 side with hallucinations. EEG to evaluate for seizure. Level of alertness: Awake AEDs during EEG study: None Technical aspects: This EEG study was done with scalp electrodes positioned according to the 10-20 International system of electrode placement. Electrical activity was reviewed with band pass filter of 1-70Hz , sensitivity of 7 uV/mm, display speed of 66mm/sec with a 60Hz  notched filter applied as appropriate. EEG data were recorded continuously and digitally stored.  Video monitoring was available and reviewed as appropriate. Description: The posterior dominant rhythm consists of 7 Hz activity of moderate voltage (25-35 uV) seen  predominantly in posterior head regions, symmetric and reactive to eye opening and eye closing. EEG showed continuous generalized and maximal left frontotemporal 3 to 6 Hz theta-delta slowing. Hyperventilation and photic stimulation were not performed.   ABNORMALITY - Continuous slow, generalized and maximal left frontotemporal IMPRESSION: This study is suggestive of cortical dysfunction arising from left frontotemporal region likely secondary to underlying structural abnormality. Additionally there is generalized cerebral dysfunction (encephalopathy). No seizures or epileptiform discharges were seen throughout the recording. Calvin Gardner    Scheduled  Meds:  aspirin   81 mg Oral Daily   feeding supplement  237 mL Oral BID BM   folic acid   1 mg Oral Daily   LORazepam   0-4 mg Oral Q12H   multivitamin with minerals  1 tablet Oral Daily   thiamine   100 mg Oral Daily   Or   thiamine   100 mg Intravenous Daily    Continuous Infusions:   LOS: 3 days     Lebron JINNY Cage, MD [facility]  If 7PM-7AM, please contact night-coverage [email or web address] 07/07/2024, 3:56 PM

## 2024-07-07 NOTE — Plan of Care (Signed)
  Problem: Education: Goal: Knowledge of disease or condition will improve Outcome: Progressing Goal: Knowledge of patient specific risk factors will improve (DELETE if not current risk factor) Outcome: Progressing   Problem: Health Behavior/Discharge Planning: Goal: Goals will be collaboratively established with patient/family Outcome: Progressing

## 2024-07-07 NOTE — Plan of Care (Signed)

## 2024-07-07 NOTE — TOC Progression Note (Signed)
 Transition of Care [facility]) - Progression Note    Patient Details  Name: Calvin Gardner MRN: [id number] Date of Birth: Jul 02, 1951  [facility] of [facility]) CM/SW Contact  Corean JAYSON Canary, RN Phone Number: 07/07/2024, 8:33 AM  Clinical Narrative:    PT sent a message to this [facility] stating the walker that was sent would not fold on one side.  Jermaine from [place] called to fix or replace, the patient will be DC home today.  Expected Discharge Plan: OP Rehab                 Expected Discharge Plan and Services                                               Social Drivers of Health (SDOH) Interventions SDOH Screenings   Food Insecurity: No Food Insecurity (07/04/2024)  Housing: Low Risk  (07/06/2024)  Transportation Needs: No Transportation Needs (07/04/2024)  Utilities: Not At Risk (07/04/2024)  Alcohol Screen: Low Risk  (04/27/2023)  Depression (PHQ2-9): Low Risk  (10/12/2023)  Financial Resource Strain: Low Risk  (04/27/2023)  Physical Activity: Sufficiently Active (04/27/2023)  Social Connections: Socially Isolated (07/06/2024)  Stress: No Stress Concern Present (04/27/2023)  Tobacco Use: High Risk (07/04/2024)  Health Literacy: Adequate Health Literacy (04/27/2023)    Readmission Risk Interventions    07/06/2024   11:44 AM  Readmission Risk Prevention Plan  Post Dischage Appt Complete  Medication Screening Complete

## 2024-07-08 ENCOUNTER — Inpatient Hospital Stay (HOSPITAL_COMMUNITY)

## 2024-07-08 DIAGNOSIS — I639 Cerebral infarction, unspecified: Secondary | ICD-10-CM | POA: Diagnosis not present

## 2024-07-08 LAB — GLUCOSE, CAPILLARY: Glucose-Capillary: 131 mg/dL — ABNORMAL HIGH (ref 70–99)

## 2024-07-08 MED ORDER — GADOBUTROL 1 MMOL/ML IV SOLN
5.6000 mL | Freq: Once | INTRAVENOUS | Status: AC | PRN
  Administered 2024-07-08: 5.6 mL via INTRAVENOUS

## 2024-07-08 NOTE — Progress Notes (Signed)
 PROGRESS NOTE  Calvin Gardner FMW:[id number] DOB: 03-17-1951 DOA: 07/04/2024 PCP: Delbert Clam, MD  HPI/Recap of past 24 hours: Calvin Gardner is a 73 y.o. male with medical history significant of alcoholism, history of hep C, liver cancer, GERD, hyperlipidemia, hypertension, pulmonary nodule, substance abuse who presented to the emergency department with complaints of an intermittent headache associated with blurred vision, dizziness and falls with reported injuries that started about 2 weeks ago. CT head showed no acute intracranial abnormality. MRI of the brain showed patchy and indistinct combined acute and subacute ischemia in the posterior right internal capsule, adjacent deep gray nuclei and temporal lobe white matter. 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  Neurology consulted, patient admitted for further management.     Pt denies any new complaints. Wants to d/c    Assessment/Plan: Principal Problem:   Acute cerebrovascular accident (CVA) (HCC) Active Problems:   Essential hypertension, benign   Tobacco abuse   IFG (impaired fasting glucose)   Alcohol abuse   GERD (gastroesophageal reflux disease)   Pulmonary nodule   Acute and subacute CVA MRI brain revealed acute and subacute right MCA territory strokes involving the posterior Right internal capsule, adjacent deep gray nuclei and temporal lobe white matter CTA head and neck with no significant vertebrobasilar or intracranial disease MRI brain with contrast, showed no abnormal intracranial enhancement Neurology/stroke team on board, reports initial MRI findings could be from underlying structural lesions like lymphoma or paraneoplastic or infectious conditions given his immunocompromise state from history of hepatocellular carcinoma treated with chemotherapy.  Neurology discussed case with neuroradiologist for possible diagnostic spinal tap to look for  autoimmune/paraneoplastic etiology versus lymphoma Echo showed EF of 55 to 60%, no regional wall motion abnormality, left ventricular diastolic parameters normal LDL 56, A1c 5.3 Continue aspirin , hold Plavix  for possible spinal tap PT/OT/SLP, recommend outpatient PT Frequent neurochecks  Essential hypertension BP stable Holding PTA amlodipine    Tobacco abuse Tobacco cessation advised. Nicotine  replacement therapy ordered.   Alcohol abuse Drinking 96 to 120 ounces of beer daily CIWA protocol with lorazepam . Magnesium sulfate supplementation. Folate, MVI and thiamine . Consult TOC team  History of hep C/liver carcinoma Outpatient follow-up  Pulmonary nodule Follow-up with PCP for pulmonary referral as an outpatient      Estimated body mass index is 20.16 kg/m as calculated from the following:   Height as of this encounter: 5' 6 (1.676 m).   Weight as of this encounter: 56.7 kg.     Code Status: Full  Family Communication: None at bedside  Disposition Plan: Status is: Inpatient Remains inpatient appropriate because: Level of care      Consultants: Neurology  Procedures: None  Antimicrobials: None  DVT prophylaxis:  SCDs   Objective: Vitals:   07/07/24 1949 07/07/24 2325 07/08/24 0400 07/08/24 0900  BP: 108/69 118/79 107/72 97/71  Pulse: 70 72 72 80  Resp: 20 18 18 18   Temp: 98.9 F (37.2 C) 98 F (36.7 C) 99 F (37.2 C)   TempSrc: Oral Oral Oral   SpO2: 99% 99% 99% 100%  Weight:      Height:       No intake or output data in the 24 hours ending 07/08/24 1519  Filed Weights   07/04/24 1621  Weight: 56.7 kg    Exam: General: NAD  Cardiovascular: S1, S2 present Respiratory: CTAB Abdomen: Soft, nontender, nondistended, bowel sounds present Musculoskeletal: No bilateral pedal edema noted Skin: Normal Psychiatry: Normal  mood     Data Reviewed: CBC: Recent Labs  Lab 07/04/24 1045 07/05/24 0455  WBC 7.7 7.7  NEUTROABS 4.8  --    HGB 12.4* 13.1  HCT 38.1* 39.8  MCV 91.6 89.8  PLT 271 283   Basic Metabolic Panel: Recent Labs  Lab 07/04/24 0906 07/04/24 1937 07/05/24 0455  NA 135  --  138  K 4.2  --  4.0  CL 102  --  102  CO2 21*  --  27  GLUCOSE 104*  --  105*  BUN 10  --  6*  CREATININE 0.80  --  0.77  CALCIUM  10.1  --  9.7  MG 2.5* 2.0  --   PHOS 3.7 4.6  --    GFR: Estimated Creatinine Clearance: 67.9 mL/min (by C-G formula based on SCr of 0.77 mg/dL). Liver Function Tests: Recent Labs  Lab 07/04/24 0906 07/04/24 1937 07/05/24 0455  AST 43* 26 22  ALT 16 14 12   ALKPHOS 51 41 39  BILITOT 0.3 0.5 0.7  PROT 7.3 6.4* 6.4*  ALBUMIN 4.3 3.3* 3.5   No results for input(s): LIPASE, AMYLASE in the last 168 hours. No results for input(s): AMMONIA in the last 168 hours. Coagulation Profile: No results for input(s): INR, PROTIME in the last 168 hours. Cardiac Enzymes: No results for input(s): CKTOTAL, CKMB, CKMBINDEX, TROPONINI in the last 168 hours. BNP (last 3 results) No results for input(s): PROBNP in the last 8760 hours. HbA1C: No results for input(s): HGBA1C in the last 72 hours. CBG: No results for input(s): GLUCAP in the last 168 hours. Lipid Profile: No results for input(s): CHOL, HDL, LDLCALC, TRIG, CHOLHDL, LDLDIRECT in the last 72 hours.  Thyroid Function Tests: No results for input(s): TSH, T4TOTAL, FREET4, T3FREE, THYROIDAB in the last 72 hours. Anemia Panel: No results for input(s): VITAMINB12, FOLATE, FERRITIN, TIBC, IRON, RETICCTPCT in the last 72 hours. Urine analysis:    Component Value Date/Time   COLORURINE YELLOW 07/24/2018 1555   APPEARANCEUR CLEAR 07/24/2018 1555   LABSPEC 1.013 07/24/2018 1555   PHURINE 7.0 07/24/2018 1555   GLUCOSEU 50 (A) 07/24/2018 1555   HGBUR NEGATIVE 07/24/2018 1555   BILIRUBINUR NEGATIVE 07/24/2018 1555   KETONESUR NEGATIVE 07/24/2018 1555   PROTEINUR NEGATIVE 07/24/2018 1555    NITRITE NEGATIVE 07/24/2018 1555   LEUKOCYTESUR NEGATIVE 07/24/2018 1555   Sepsis Labs: @LABRCNTIP (procalcitonin:4,lacticidven:4)  )No results found for this or any previous visit (from the past 240 hours).    Studies: No results found.   Scheduled Meds:  aspirin   81 mg Oral Daily   feeding supplement  237 mL Oral BID BM   folic acid   1 mg Oral Daily   multivitamin with minerals  1 tablet Oral Daily   thiamine   100 mg Oral Daily   Or   thiamine   100 mg Intravenous Daily    Continuous Infusions:   LOS: 4 days     Lebron JINNY Cage, MD [facility]  If 7PM-7AM, please contact night-coverage [email or web address] 07/08/2024, 3:19 PM

## 2024-07-08 NOTE — Progress Notes (Signed)
 NEUROLOGY CONSULT FOLLOW UP NOTE   Date of service: July 08, 2024 Patient Name: Calvin Gardner MRN:  [id number] DOB:  [birth year]  Interval Hx/subjective   Seen in room, eager to go home but understands that the noted imaging abnormality needs further clearance.  Vitals   Vitals:   07/07/24 1554 07/07/24 1949 07/07/24 2325 07/08/24 0400  BP: 131/72 108/69 118/79 107/72  Pulse: 85 70 72 72  Resp: 18 20 18 18   Temp: 99.1 F (37.3 C) 98.9 F (37.2 C) 98 F (36.7 C) 99 F (37.2 C)  TempSrc: Oral Oral Oral Oral  SpO2: 100% 99% 99% 99%  Weight:      Height:         Body mass index is 20.16 kg/m.  Physical Exam   General:  Alert, well-nourished, well-developed patient in no acute distress Psych:  Mood and affect appropriate for situation CV: Regular rate and rhythm on monitor Respiratory:  Regular, unlabored respirations on room air GI: Abdomen soft and nontender     NEURO:  Mental Status: AA&Ox3, patient is able to give clear and coherent history Speech/Language: speech is without dysarthria or aphasia.  Naming, repetition, fluency, and comprehension intact.   Cranial Nerves:  II: PERRL. Visual fields with left hemianopia  III, IV, VI: EOMI. Eyelids elevate symmetrically.  V: Sensation is intact to light touch and symmetrical to face.  VII: Face is symmetrical resting and smiling VIII: hearing intact to voice. IX, X: Palate elevates symmetrically. Phonation is normal.  KP:Dynloizm shrug 5/5. XII: tongue is midline without fasciculations. Motor: 5/5 strength to all muscle groups tested.  Trace weakness left and finger movements in the left hand.   Symmetric grip strength laterally. Tone: is normal and bulk is normal Sensation- Intact to light touch bilaterally. Extinction absent to light touch to DSS.   Coordination: FTN intact bilaterally, HKS: no ataxia in BLE.No drift. Decreased fine finger movements on left hand  Deep tendon reflexes brisker on the left  compared to the right. Gait- deferred  Medications  Current Facility-Administered Medications:    acetaminophen  (TYLENOL ) tablet 650 mg, 650 mg, Oral, Q6H PRN, 650 mg at 07/07/24 2015 **OR** acetaminophen  (TYLENOL ) suppository 650 mg, 650 mg, Rectal, Q6H PRN, Celinda Alm Lot, MD   aspirin  chewable tablet 81 mg, 81 mg, Oral, Daily, Waddell Aquas A, NP, 81 mg at 07/07/24 0825   feeding supplement (ENSURE PLUS HIGH PROTEIN) liquid 237 mL, 237 mL, Oral, BID BM, Ezenduka, Nkeiruka J, MD, 237 mL at 07/07/24 1356   folic acid  (FOLVITE ) tablet 1 mg, 1 mg, Oral, Daily, Celinda Alm Lot, MD, 1 mg at 07/07/24 0825   [EXPIRED] LORazepam  (ATIVAN ) tablet 0-4 mg, 0-4 mg, Oral, Q6H, 1 mg at 07/04/24 1531 **FOLLOWED BY** LORazepam  (ATIVAN ) tablet 0-4 mg, 0-4 mg, Oral, Q12H, Celinda Alm Lot, MD   multivitamin with minerals tablet 1 tablet, 1 tablet, Oral, Daily, Celinda Alm Lot, MD, 1 tablet at 07/07/24 0825   nicotine  (NICODERM CQ  - dosed in mg/24 hours) patch 14 mg, 14 mg, Transdermal, Daily PRN, Celinda Alm Lot, MD   ondansetron  (ZOFRAN ) tablet 4 mg, 4 mg, Oral, Q6H PRN **OR** ondansetron  (ZOFRAN ) injection 4 mg, 4 mg, Intravenous, Q6H PRN, Celinda Alm Lot, MD   thiamine  (VITAMIN B1) tablet 100 mg, 100 mg, Oral, Daily, 100 mg at 07/07/24 0825 **OR** thiamine  (VITAMIN B1) injection 100 mg, 100 mg, Intravenous, Daily, Celinda Alm Lot, MD  Labs and Diagnostic Imaging   CBC:  Recent Labs  Lab  07/04/24 1045 07/05/24 0455  WBC 7.7 7.7  NEUTROABS 4.8  --   HGB 12.4* 13.1  HCT 38.1* 39.8  MCV 91.6 89.8  PLT 271 283    Basic Metabolic Panel:  Lab Results  Component Value Date   NA 138 07/05/2024   K 4.0 07/05/2024   CO2 27 07/05/2024   GLUCOSE 105 (H) 07/05/2024   BUN 6 (L) 07/05/2024   CREATININE 0.77 07/05/2024   CALCIUM  9.7 07/05/2024   GFRNONAA >60 07/05/2024   GFRAA 104 08/12/2020   Lipid Panel:  Lab Results  Component Value Date   LDLCALC 56 07/05/2024    HgbA1c:  Lab Results  Component Value Date   HGBA1C 5.3 07/04/2024   INR  Lab Results  Component Value Date   INR 1.0 08/12/2020   APTT  Lab Results  Component Value Date   APTT 30 08/03/2018   CT head No acute abnormality.  CTA head & neck Plaque in the right carotid bulb with less than 50% stenosis 2. Calcified plaque in the left carotid bulb without significant stenosis MRI Brain w wo  Patchy and indistinct combined acute and subacute ischemia in the posterior Right internal capsule, adjacent deep gray nuclei and temporal lobe white matter. No hemorrhagic transformation or mass effect. 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 no abnormal enhancement on contrast study  2D Echo ejection fraction 55 to 60%.  Left atrial size normal  Assessment   Calvin Gardner is a 73 y.o. male with history of alcohol abuse, aortic atherosclerosis, arthritis, chronic hepatitis C, liver cancer, cataract, HLD, HTN, pulmonary nodule and substance abuse who presented to the ED with symptoms of intermittent headache and blurred vision for 2 weeks. During that time period, he experienced some falls and also frequently was running into things with the left side of his body. MRI scan showing mild diffusion hyperintensity in the right temporal lobe and posterior capsule white matter extending up to the top of the brainstem not in a typical vascular distribution.   However, clinically, he essentially woke up with these symptoms about 2 weeks ago.  Thou these findings could be from underlying structural lesion like lymphoma or paraneoplastic or infectious conditions given his immunocompromise status from hepatocellular carcinoma, will plan to repeat MRI today and re evaluate tomorrow again prior to attempting LP.  Recommendations  -  Repeat MRI brain  ______________________________________________________________________   Signed, Jorene Last, NP [facility]   NEUROHOSPITALIST ADDENDUM Performed a face to face diagnostic evaluation.   I have reviewed the contents of history and physical exam as documented by PA/ARNP/Resident and agree with above documentation.  I have discussed and formulated the above plan as documented. Edits to the note have been made as needed.   Roseanna Koplin, MD [facility] [phone]   If 7pm to 7am, please call on call as listed on AMION.

## 2024-07-08 NOTE — Plan of Care (Signed)
  Problem: Education: Goal: Knowledge of disease or condition will improve Outcome: Progressing Goal: Knowledge of secondary prevention will improve (MUST DOCUMENT ALL) Outcome: Progressing Goal: Knowledge of patient specific risk factors will improve (DELETE if not current risk factor) Outcome: Progressing   Problem: Ischemic Stroke/TIA Tissue Perfusion: Goal: Complications of ischemic stroke/TIA will be minimized Outcome: Progressing   Problem: Coping: Goal: Will verbalize positive feelings about self Outcome: Progressing Goal: Will identify appropriate support needs Outcome: Progressing   Problem: Health Behavior/Discharge Planning: Goal: Ability to manage health-related needs will improve Outcome: Progressing Goal: Goals will be collaboratively established with patient/family Outcome: Progressing   Problem: Self-Care: Goal: Ability to participate in self-care as condition permits will improve Outcome: Progressing Goal: Verbalization of feelings and concerns over difficulty with self-care will improve Outcome: Progressing Goal: Ability to communicate needs accurately will improve Outcome: Progressing   Problem: Nutrition: Goal: Risk of aspiration will decrease Outcome: Progressing Goal: Dietary intake will improve Outcome: Progressing   Problem: Health Behavior/Discharge Planning: Goal: Ability to manage health-related needs will improve Outcome: Progressing   Problem: Education: Goal: Knowledge of General Education information will improve Description: Including pain rating scale, medication(s)/side effects and non-pharmacologic comfort measures Outcome: Progressing   Problem: Clinical Measurements: Goal: Ability to maintain clinical measurements within normal limits will improve Outcome: Progressing Goal: Will remain free from infection Outcome: Progressing Goal: Diagnostic test results will improve Outcome: Progressing Goal: Respiratory complications will  improve Outcome: Progressing Goal: Cardiovascular complication will be avoided Outcome: Progressing

## 2024-07-09 ENCOUNTER — Other Ambulatory Visit (HOSPITAL_COMMUNITY): Payer: Self-pay

## 2024-07-09 ENCOUNTER — Inpatient Hospital Stay (HOSPITAL_COMMUNITY)

## 2024-07-09 DIAGNOSIS — I639 Cerebral infarction, unspecified: Secondary | ICD-10-CM | POA: Diagnosis not present

## 2024-07-09 LAB — RAPID URINE DRUG SCREEN, HOSP PERFORMED
Amphetamines: NOT DETECTED
Barbiturates: NOT DETECTED
Benzodiazepines: NOT DETECTED
Cocaine: NOT DETECTED
Opiates: NOT DETECTED
Tetrahydrocannabinol: NOT DETECTED

## 2024-07-09 MED ORDER — ACETAMINOPHEN 500 MG PO TABS: 1000.0000 mg | ORAL_TABLET | Freq: Every morning | ORAL | Status: DC

## 2024-07-09 MED ORDER — ATORVASTATIN CALCIUM 10 MG PO TABS
20.0000 mg | ORAL_TABLET | Freq: Every day | ORAL | Status: DC
  Administered 2024-07-09 – 2024-07-15 (×6): 20 mg via ORAL
  Filled 2024-07-09 (×7): qty 2

## 2024-07-09 MED ORDER — ADULT MULTIVITAMIN W/MINERALS CH
1.0000 | ORAL_TABLET | Freq: Every day | ORAL | 0 refills | Status: DC
  Filled 2024-07-09: qty 30, 30d supply, fill #0

## 2024-07-09 MED ORDER — THIAMINE HCL 100 MG PO TABS
100.0000 mg | ORAL_TABLET | Freq: Every day | ORAL | 0 refills | Status: DC
  Filled 2024-07-09: qty 30, 30d supply, fill #0

## 2024-07-09 MED ORDER — ASPIRIN 81 MG PO CHEW
81.0000 mg | CHEWABLE_TABLET | Freq: Every day | ORAL | 0 refills | Status: DC
  Filled 2024-07-09: qty 90, 90d supply, fill #0

## 2024-07-09 MED ORDER — FOLIC ACID 1 MG PO TABS
1.0000 mg | ORAL_TABLET | Freq: Every day | ORAL | 0 refills | Status: DC
  Filled 2024-07-09: qty 30, 30d supply, fill #0

## 2024-07-09 MED ORDER — CLOPIDOGREL BISULFATE 75 MG PO TABS
75.0000 mg | ORAL_TABLET | Freq: Every day | ORAL | Status: AC
  Administered 2024-07-09 – 2024-07-15 (×7): 75 mg via ORAL
  Filled 2024-07-09 (×7): qty 1

## 2024-07-09 MED ORDER — CLOPIDOGREL BISULFATE 75 MG PO TABS
75.0000 mg | ORAL_TABLET | Freq: Every day | ORAL | 0 refills | Status: DC
  Filled 2024-07-09: qty 90, 90d supply, fill #0

## 2024-07-09 MED ORDER — ENOXAPARIN SODIUM 40 MG/0.4ML IJ SOSY
40.0000 mg | PREFILLED_SYRINGE | INTRAMUSCULAR | Status: DC
  Administered 2024-07-09 – 2024-07-11 (×3): 40 mg via SUBCUTANEOUS
  Filled 2024-07-09 (×3): qty 0.4

## 2024-07-09 NOTE — Progress Notes (Incomplete)
 STROKE TEAM PROGRESS NOTE    SIGNIFICANT HOSPITAL EVENTS  12/6: p/w intermittent h/a and blurred vision x 2weeks. Has also had multiple falls and running into things with left side of body.   INTERIM HISTORY/SUBJECTIVE    OBJECTIVE  CBC    Component Value Date/Time   WBC 7.7 07/05/2024 0455   RBC 4.43 07/05/2024 0455   HGB 13.1 07/05/2024 0455   HGB 13.5 06/12/2019 1156   HGB 13.0 05/16/2018 1219   HCT 39.8 07/05/2024 0455   HCT 39.1 05/16/2018 1219   PLT 283 07/05/2024 0455   PLT 266 06/12/2019 1156   PLT 199 05/16/2018 1219   MCV 89.8 07/05/2024 0455   MCV 91 05/16/2018 1219   MCH 29.6 07/05/2024 0455   MCHC 32.9 07/05/2024 0455   RDW 13.0 07/05/2024 0455   RDW 12.1 (L) 05/16/2018 1219   LYMPHSABS 1.6 07/04/2024 1045   LYMPHSABS 1.0 05/16/2018 1219   MONOABS 1.0 07/04/2024 1045   EOSABS 0.3 07/04/2024 1045   EOSABS 0.0 05/16/2018 1219   BASOSABS 0.1 07/04/2024 1045   BASOSABS 0.1 05/16/2018 1219    BMET    Component Value Date/Time   NA 138 07/05/2024 0455   NA 132 (L) 04/13/2024 1016   K 4.0 07/05/2024 0455   CL 102 07/05/2024 0455   CO2 27 07/05/2024 0455   GLUCOSE 105 (H) 07/05/2024 0455   BUN 6 (L) 07/05/2024 0455   BUN 11 04/13/2024 1016   CREATININE 0.77 07/05/2024 0455   CREATININE 0.86 08/12/2020 0759   CALCIUM  9.7 07/05/2024 0455   EGFR 73 04/13/2024 1016   GFRNONAA >60 07/05/2024 0455   GFRNONAA 90 08/12/2020 0759    IMAGING past 24 hours MR BRAIN W WO CONTRAST Result Date: 07/08/2024 EXAM: MRI BRAIN WITH AND WITHOUT CONTRAST 07/08/2024 04:55:51 PM TECHNIQUE: Multiplanar multisequence MRI of the head/brain was performed with and without the administration of 5.6 mL gadobutrol  (GADAVIST ) 1 MMOL/ML injection intravenous contrast. COMPARISON: None available. CLINICAL HISTORY: Neuro deficit, acute, stroke suspected; Stroke, follow up; Transient ischemic attack (TIA). FINDINGS: BRAIN AND VENTRICLES: Expected evolution of subacute nonhemorrhagic  infarcts are noted within the posterior limb of the right internal capsule and right temporal lobe. Extensive T2 and FLAIR hyperintensity is present in the right temporal lobe. No associated enhancement is present with these infarcts. Extensive T2 hyperintensities are present again in the central pons. No acute intracranial hemorrhage. No mass effect or midline shift. No hydrocephalus. The sella is unremarkable. Normal flow voids. No other mass or abnormal enhancement. ORBITS: Bilateral lens replacements are noted. The globes and orbits are otherwise within normal limits. SINUSES: No acute abnormality. BONES AND SOFT TISSUES: Normal bone marrow signal and enhancement. No acute soft tissue abnormality. IMPRESSION: 1. Expected evolution of subacute nonhemorrhagic infarcts in the posterior limb right internal capsule and right temporal lobe, with extensive T2 and FLAIR hyperintensity in the right temporal lobe without associated enhancement. No new or progressive infarct. 2. Extensive T2 hyperintensities in the central pons. Electronically signed by: Lonni Necessary MD 07/08/2024 06:43 PM EST RP Workstation: [id number]    Vitals:   07/08/24 1520 07/08/24 1948 07/09/24 0109 07/09/24 0801  BP: 109/71 112/69 98/64 (!) 137/90  Pulse: 71 98 73 84  Resp: 16 18 18 18   Temp: 98.6 F (37 C) 98.6 F (37 C)  98.4 F (36.9 C)  TempSrc: Oral Oral  Oral  SpO2: 100% 100% 100% 100%  Weight:      Height:  PHYSICAL EXAM General:  Alert, well-nourished, well-developed patient in no acute distress Psych:  Mood and affect appropriate for situation CV: Regular rate and rhythm on monitor Respiratory:  Regular, unlabored respirations on room air GI: Abdomen soft and nontender   NEURO:  Mental Status: AA&Ox3, patient is able to give clear and coherent history Speech/Language: speech is without dysarthria or aphasia.  Naming, repetition, fluency, and comprehension intact.  Cranial Nerves:  II: PERRL.  Visual fields full.  III, IV, VI: EOMI. Eyelids elevate symmetrically.  V: Sensation is intact to light touch and symmetrical to face.  VII: Face is symmetrical resting and smiling VIII: hearing intact to voice. IX, X: Palate elevates symmetrically. Phonation is normal.  KP:Dynloizm shrug 5/5. XII: tongue is midline without fasciculations. Motor: 5/5 strength to all muscle groups tested.  Tone: is normal and bulk is normal Sensation- Intact to light touch bilaterally. Extinction absent to light touch to DSS.   Coordination: FTN intact bilaterally, HKS: no ataxia in BLE.No drift.  Gait- deferred  Most Recent NIH ***     ASSESSMENT/PLAN  Mr. Calvin Gardner is a 73 y.o. male with history of *** admitted for ***.  NIH on Admission ***  {Neurological Diagnosis:29705}:  {gen right left:310021} *** s/p {Stroke Treatment:29704} Etiology:  ***  CT head No acute abnormality.  CTA head & neck  Plaque in the right carotid bulb with less than 50% stenosis 2. Calcified plaque in the left carotid bulb without significant stenosis MRI Brain w wo 12/2 Patchy and indistinct combined acute and subacute ischemia in the posterior Right internal capsule, adjacent deep gray nuclei and temporal lobe white matter. No hemorrhagic transformation or mass effect. 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 Repeat MRI w/wo 12/6: Expected evolution of subacute nonhemorrhagic infarcts in the posterior limb right internal capsule and right temporal lobe, with extensive T2 and FLAIR hyperintensity in the right temporal lobe without associated enhancement.  No new or progressive infarct. Extensive T2 hyperintensities in the central pons. 2D Echo *** LDL 56 HgbA1c 5.3 VTE prophylaxis - *** {anticoagulants:31417} prior to admission, now on  {anticoagulants:31417} for *** weeks and then *** alone. Therapy recommendations:  {Therapy Recommendations at Discharge:29703} Disposition:  ***  Hx of Stroke/TIA ***  Atrial fibrillation Home Meds:  Continue telemetry monitoring Begin anticoagulation with ***   Hypertension Home meds:  *** Uns***Stable {Blood Pressure Goal:29586}   Hyperlipidemia Home meds:  ***, ***resumed in hospital LDL 56, goal < 70 Add ***  High intensity statin not indicated *** Continue statin at discharge  Diabetes type II Unc***Controlled Home meds:  *** HgbA1c 5.3, goal < 7.0 CBGs SSI Recommend close follow-up with PCP for better DM control  Tobacco Abuse Patient smokes *** packs per day for *** years {Ready to Quit?:21018006} Nicotine  replacement therapy provided  Substance Abuse Patient uses *** UDS positive for *** THC *** Cocaine {Ready to Quit?:21018006} TOC consult for cessation placed  Dysphagia Patient has post-stroke dysphagia, SLP consulted    Diet   Diet Heart Room service appropriate? Yes; Fluid consistency: Thin   Advance diet as tolerated  Other Stroke Risk Factors ***ETOH use, alcohol level No results found for requested labs within last 1095 days., advised to drink no more than 1***2 drink(s) a day ***Obesity, Body mass index is 20.16 kg/m., BMI >/= 30 associated with increased stroke  risk, recommend weight loss, diet and exercise as appropriate  ***Family hx stroke (***) ***Coronary artery disease ***Congestive heart failure ***Obstructive sleep apnea, ***on CPAP at home ***Migraines   Other Active Problems   Hospital day # 5    To contact Stroke Continuity provider, please refer to [email or web address]. After hours, contact General Neurology

## 2024-07-09 NOTE — Progress Notes (Signed)
 STROKE TEAM PROGRESS NOTE    INTERIM HISTORY/SUBJECTIVE No family at the bedside. Patient appears frustrated and states he wants to go home.  P2 Y12 level was suppressed and she will have to wait at least till tomorrow to do spinal tap   CBC    Component Value Date/Time   WBC 7.7 07/05/2024 0455   RBC 4.43 07/05/2024 0455   HGB 13.1 07/05/2024 0455   HGB 13.5 06/12/2019 1156   HGB 13.0 05/16/2018 1219   HCT 39.8 07/05/2024 0455   HCT 39.1 05/16/2018 1219   PLT 283 07/05/2024 0455   PLT 266 06/12/2019 1156   PLT 199 05/16/2018 1219   MCV 89.8 07/05/2024 0455   MCV 91 05/16/2018 1219   MCH 29.6 07/05/2024 0455   MCHC 32.9 07/05/2024 0455   RDW 13.0 07/05/2024 0455   RDW 12.1 (L) 05/16/2018 1219   LYMPHSABS 1.6 07/04/2024 1045   LYMPHSABS 1.0 05/16/2018 1219   MONOABS 1.0 07/04/2024 1045   EOSABS 0.3 07/04/2024 1045   EOSABS 0.0 05/16/2018 1219   BASOSABS 0.1 07/04/2024 1045   BASOSABS 0.1 05/16/2018 1219    BMET    Component Value Date/Time   NA 138 07/05/2024 0455   NA 132 (L) 04/13/2024 1016   K 4.0 07/05/2024 0455   CL 102 07/05/2024 0455   CO2 27 07/05/2024 0455   GLUCOSE 105 (H) 07/05/2024 0455   BUN 6 (L) 07/05/2024 0455   BUN 11 04/13/2024 1016   CREATININE 0.77 07/05/2024 0455   CREATININE 0.86 08/12/2020 0759   CALCIUM  9.7 07/05/2024 0455   EGFR 73 04/13/2024 1016   GFRNONAA >60 07/05/2024 0455   GFRNONAA 90 08/12/2020 0759    IMAGING past 24 hours MR BRAIN W WO CONTRAST Result Date: 07/08/2024 EXAM: MRI BRAIN WITH AND WITHOUT CONTRAST 07/08/2024 04:55:51 PM TECHNIQUE: Multiplanar multisequence MRI of the head/brain was performed with and without the administration of 5.6 mL gadobutrol  (GADAVIST ) 1 MMOL/ML injection intravenous contrast. COMPARISON: None available. CLINICAL HISTORY: Neuro deficit, acute, stroke suspected; Stroke, follow up; Transient ischemic attack (TIA). FINDINGS: BRAIN AND VENTRICLES: Expected evolution of subacute nonhemorrhagic  infarcts are noted within the posterior limb of the right internal capsule and right temporal lobe. Extensive T2 and FLAIR hyperintensity is present in the right temporal lobe. No associated enhancement is present with these infarcts. Extensive T2 hyperintensities are present again in the central pons. No acute intracranial hemorrhage. No mass effect or midline shift. No hydrocephalus. The sella is unremarkable. Normal flow voids. No other mass or abnormal enhancement. ORBITS: Bilateral lens replacements are noted. The globes and orbits are otherwise within normal limits. SINUSES: No acute abnormality. BONES AND SOFT TISSUES: Normal bone marrow signal and enhancement. No acute soft tissue abnormality. IMPRESSION: 1. Expected evolution of subacute nonhemorrhagic infarcts in the posterior limb right internal capsule and right temporal lobe, with extensive T2 and FLAIR hyperintensity in the right temporal lobe without associated enhancement. No new or progressive infarct. 2. Extensive T2 hyperintensities in the central pons. Electronically signed by: Lonni Necessary MD 07/08/2024 06:43 PM EST RP Workstation: [id number]    Vitals:   07/08/24 1520 07/08/24 1948 07/09/24 0109 07/09/24 0801  BP: 109/71 112/69 98/64 (!) 137/90  Pulse: 71 98 73 84  Resp: 16 18 18 18   Temp: 98.6 F (37 C) 98.6 F (37 C)  98.4 F (36.9 C)  TempSrc: Oral Oral  Oral  SpO2: 100% 100% 100% 100%  Weight:      Height:  PHYSICAL EXAM General:  Alert, well-nourished, well-developed patient in no acute distress Psych:  Mood and affect appropriate for situation CV: Regular rate and rhythm on monitor Respiratory:  Regular, unlabored respirations on room air GI: Abdomen soft and nontender   NEURO:  Mental Status: AA&Ox3, patient is able to give clear and coherent history Speech/Language: speech is without dysarthria or aphasia.  Naming, repetition, fluency, and comprehension intact.  Cranial Nerves:  II: PERRL.  Visual fields with left hemianopia  III, IV, VI: EOMI. Eyelids elevate symmetrically.  V: Sensation is intact to light touch and symmetrical to face.  VII: Face is symmetrical resting and smiling VIII: hearing intact to voice. IX, X: Palate elevates symmetrically. Phonation is normal.  KP:Dynloizm shrug 5/5. XII: tongue is midline without fasciculations. Motor: 5/5 strength to all muscle groups tested.  Trace weakness left and finger movements in the left hand.  RIGHT to left extremity.  Symmetric grip strength laterally. Tone: is normal and bulk is normal Sensation- Intact to light touch bilaterally. Extinction absent to light touch to DSS.   Coordination: FTN intact bilaterally, HKS: no ataxia in BLE.No drift. Decreased fine finger movements on left hand  Deep tendon reflexes brisker on the left compared to the right. Gait- deferred   ASSESSMENT/PLAN  Calvin Gardner is a 73 y.o. male with history of alcohol abuse, aortic atherosclerosis, arthritis, chronic hepatitis C, liver cancer, cataract, HLD, HTN, pulmonary nodule and substance abuse who presented to the ED this morning with symptoms of intermittent headache and blurred vision for 2 weeks. During that time period, he experienced some falls and also frequently was running into things with the left side of his body.  Stroke: R MCA scattered subacute Infarcts including R optic radiation, etiology likely large vessel disease from R ICA stenosis CT head No acute abnormality.  CTA head & neck: Plaque in the right carotid bulb with less than 50% stenosis. Calcified plaque in the left carotid bulb without significant stenosis MRI: Patchy and indistinct combined acute and subacute ischemia in the posterior Right internal capsule, adjacent deep gray nuclei and temporal lobe white matter. No hemorrhagic transformation or mass effect. 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 Moderate signal  abnormality in the pons which could be chronic small vessel disease related vs due to previous myelinolysis. MRI brain w contrast no abnormal enhancement MRI w/wo repeat no significant change 2D Echo EF 55 to 60%.  Left atrial size normal LDL 56 HgbA1c 5.3 UDS pending VTE prophylaxis - SCD's No antithrombotic prior to admission, now on  ASA 81 and plavix  75 DAPT. Further regimen per [facility]. Therapy recommendations:  Outpt PT OT Disposition:  pending   R ICA stenosis CTA head and neck showed R ICA extensive soft plaque with stenosis approaching 50% Seems to be unstable plaque with R MCA scattered infarcts VVS Dr. Gretta consulted On DAPT now  Hypertension Home meds:  amlodipine  5mg   Stable Long term BP goal normotensive  Hyperlipidemia Home meds:  atorvastatin  20mg , resumed in hospital LDL 56, goal < 70 Continue statin at discharge  Tobacco Abuse Patient smokes 10-15 cigarettes per day for years      Ready to quit? No Nicotine  replacement therapy provided Cessation education provided  ETOH abuse  CIWA protocol  Drinking 96 to 120 ounces of beer daily  ETOH use advised to drink no more than 2 drink(s) a day Folate and thiamine  and MVI daily   Other stroke risk  factors Advanced age  Other Active Problems History of hep C/liver carcinoma s/p chemo in 2019, now in remission  Hospital day # 5  Ary Cummins, MD PhD Stroke Neurology 07/09/2024 11:35 AM   To contact Stroke Continuity provider, please refer to [email or web address]. After hours, contact General Neurology

## 2024-07-09 NOTE — Plan of Care (Signed)
  Problem: Education: Goal: Knowledge of disease or condition will improve Outcome: Progressing Goal: Knowledge of secondary prevention will improve (MUST DOCUMENT ALL) Outcome: Progressing Goal: Knowledge of patient specific risk factors will improve (DELETE if not current risk factor) Outcome: Progressing   Problem: Coping: Goal: Will verbalize positive feelings about self Outcome: Progressing Goal: Will identify appropriate support needs Outcome: Progressing   Problem: Ischemic Stroke/TIA Tissue Perfusion: Goal: Complications of ischemic stroke/TIA will be minimized Outcome: Progressing

## 2024-07-09 NOTE — Progress Notes (Signed)
 VASCULAR LAB    Carotid duplex has been performed.  See CV proc for preliminary results.   Beautiful Pensyl, RVT 07/09/2024, 3:33 PM

## 2024-07-09 NOTE — Progress Notes (Signed)
 PROGRESS NOTE  SHEFFIELD HAWKER FMW:[id number] DOB: 10/25/1950 DOA: 07/04/2024 PCP: Delbert Clam, MD  HPI/Recap of past 24 hours: Calvin Gardner is a 73 y.o. male with medical history significant of alcoholism, history of hep C, liver cancer, GERD, hyperlipidemia, hypertension, pulmonary nodule, substance abuse who presented to the emergency department with complaints of an intermittent headache associated with blurred vision, dizziness and falls with reported injuries that started about 2 weeks ago. CT head showed no acute intracranial abnormality. MRI of the brain showed patchy and indistinct combined acute and subacute ischemia in the posterior right internal capsule, adjacent deep gray nuclei and temporal lobe white matter. 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  Neurology consulted, patient admitted for further management.     Pt denies any new complaints.     Assessment/Plan: Principal Problem:   Acute cerebrovascular accident (CVA) (HCC) Active Problems:   Essential hypertension, benign   Tobacco abuse   IFG (impaired fasting glucose)   Alcohol abuse   GERD (gastroesophageal reflux disease)   Pulmonary nodule   Acute and subacute CVA MRI brain revealed acute and subacute right MCA territory strokes involving the posterior Right internal capsule, adjacent deep gray nuclei and temporal lobe white matter MRI brain with contrast, showed no abnormal intracranial enhancement CTA head and neck with no significant vertebrobasilar or intracranial disease (scan reviewed by Vascular/Neurology agree with over 50% R ICA plaque)  Carotid duplex pending, if confirms above, Vascular on board, recommend carotid revascularization  Neurology/stroke team on board, reports initial MRI findings could be from underlying structural lesions like lymphoma or paraneoplastic or infectious, repeat as noted above Echo showed EF of 55 to 60%, no  regional wall motion abnormality, left ventricular diastolic parameters normal LDL 56, A1c 5.3 Continue aspirin , Plavix  PT/OT/SLP, recommend outpatient PT/OT Frequent neurochecks  Essential hypertension BP stable Holding PTA amlodipine    Tobacco abuse Tobacco cessation advised. Nicotine  replacement therapy ordered.   Alcohol abuse Drinking 96 to 120 ounces of beer daily CIWA protocol with lorazepam . Magnesium sulfate supplementation. Folate, MVI and thiamine . Consult TOC team  History of hep C/liver carcinoma Outpatient follow-up  Pulmonary nodule Follow-up with PCP for pulmonary referral as an outpatient      Estimated body mass index is 20.16 kg/m as calculated from the following:   Height as of this encounter: 5' 6 (1.676 m).   Weight as of this encounter: 56.7 kg.     Code Status: Full  Family Communication: None at bedside  Disposition Plan: Status is: Inpatient Remains inpatient appropriate because: Level of care      Consultants: Neurology Vascular Surgery  Procedures: None  Antimicrobials: None  DVT prophylaxis: Lovenox    Objective: Vitals:   07/08/24 1948 07/09/24 0109 07/09/24 0801 07/09/24 1201  BP: 112/69 98/64 (!) 137/90 135/83  Pulse: 98 73 84 77  Resp: 18 18 18 18   Temp: 98.6 F (37 C)  98.4 F (36.9 C) 98.3 F (36.8 C)  TempSrc: Oral  Oral Oral  SpO2: 100% 100% 100% 100%  Weight:      Height:       No intake or output data in the 24 hours ending 07/09/24 1601  Filed Weights   07/04/24 1621  Weight: 56.7 kg    Exam: General: NAD  Cardiovascular: S1, S2 present Respiratory: CTAB Abdomen: Soft, nontender, nondistended, bowel sounds present Musculoskeletal: No bilateral pedal edema noted Skin: Normal Psychiatry: Normal mood     Data Reviewed:  CBC: Recent Labs  Lab 07/04/24 1045 07/05/24 0455  WBC 7.7 7.7  NEUTROABS 4.8  --   HGB 12.4* 13.1  HCT 38.1* 39.8  MCV 91.6 89.8  PLT 271 283   Basic  Metabolic Panel: Recent Labs  Lab 07/04/24 0906 07/04/24 1937 07/05/24 0455  NA 135  --  138  K 4.2  --  4.0  CL 102  --  102  CO2 21*  --  27  GLUCOSE 104*  --  105*  BUN 10  --  6*  CREATININE 0.80  --  0.77  CALCIUM  10.1  --  9.7  MG 2.5* 2.0  --   PHOS 3.7 4.6  --    GFR: Estimated Creatinine Clearance: 67.9 mL/min (by C-G formula based on SCr of 0.77 mg/dL). Liver Function Tests: Recent Labs  Lab 07/04/24 0906 07/04/24 1937 07/05/24 0455  AST 43* 26 22  ALT 16 14 12   ALKPHOS 51 41 39  BILITOT 0.3 0.5 0.7  PROT 7.3 6.4* 6.4*  ALBUMIN 4.3 3.3* 3.5   No results for input(s): LIPASE, AMYLASE in the last 168 hours. No results for input(s): AMMONIA in the last 168 hours. Coagulation Profile: No results for input(s): INR, PROTIME in the last 168 hours. Cardiac Enzymes: No results for input(s): CKTOTAL, CKMB, CKMBINDEX, TROPONINI in the last 168 hours. BNP (last 3 results) No results for input(s): PROBNP in the last 8760 hours. HbA1C: No results for input(s): HGBA1C in the last 72 hours. CBG: Recent Labs  Lab 07/08/24 1709  GLUCAP 131*   Lipid Profile: No results for input(s): CHOL, HDL, LDLCALC, TRIG, CHOLHDL, LDLDIRECT in the last 72 hours.  Thyroid Function Tests: No results for input(s): TSH, T4TOTAL, FREET4, T3FREE, THYROIDAB in the last 72 hours. Anemia Panel: No results for input(s): VITAMINB12, FOLATE, FERRITIN, TIBC, IRON, RETICCTPCT in the last 72 hours. Urine analysis:    Component Value Date/Time   COLORURINE YELLOW 07/24/2018 1555   APPEARANCEUR CLEAR 07/24/2018 1555   LABSPEC 1.013 07/24/2018 1555   PHURINE 7.0 07/24/2018 1555   GLUCOSEU 50 (A) 07/24/2018 1555   HGBUR NEGATIVE 07/24/2018 1555   BILIRUBINUR NEGATIVE 07/24/2018 1555   KETONESUR NEGATIVE 07/24/2018 1555   PROTEINUR NEGATIVE 07/24/2018 1555   NITRITE NEGATIVE 07/24/2018 1555   LEUKOCYTESUR NEGATIVE 07/24/2018 1555    Sepsis Labs: @LABRCNTIP (procalcitonin:4,lacticidven:4)  )No results found for this or any previous visit (from the past 240 hours).    Studies: MR BRAIN W WO CONTRAST Result Date: 07/08/2024 EXAM: MRI BRAIN WITH AND WITHOUT CONTRAST 07/08/2024 04:55:51 PM TECHNIQUE: Multiplanar multisequence MRI of the head/brain was performed with and without the administration of 5.6 mL gadobutrol  (GADAVIST ) 1 MMOL/ML injection intravenous contrast. COMPARISON: None available. CLINICAL HISTORY: Neuro deficit, acute, stroke suspected; Stroke, follow up; Transient ischemic attack (TIA). FINDINGS: BRAIN AND VENTRICLES: Expected evolution of subacute nonhemorrhagic infarcts are noted within the posterior limb of the right internal capsule and right temporal lobe. Extensive T2 and FLAIR hyperintensity is present in the right temporal lobe. No associated enhancement is present with these infarcts. Extensive T2 hyperintensities are present again in the central pons. No acute intracranial hemorrhage. No mass effect or midline shift. No hydrocephalus. The sella is unremarkable. Normal flow voids. No other mass or abnormal enhancement. ORBITS: Bilateral lens replacements are noted. The globes and orbits are otherwise within normal limits. SINUSES: No acute abnormality. BONES AND SOFT TISSUES: Normal bone marrow signal and enhancement. No acute soft tissue abnormality. IMPRESSION: 1. Expected evolution of subacute  nonhemorrhagic infarcts in the posterior limb right internal capsule and right temporal lobe, with extensive T2 and FLAIR hyperintensity in the right temporal lobe without associated enhancement. No new or progressive infarct. 2. Extensive T2 hyperintensities in the central pons. Electronically signed by: Lonni Necessary MD 07/08/2024 06:43 PM EST RP Workstation: [id number]     Scheduled Meds:  aspirin   81 mg Oral Daily   atorvastatin   20 mg Oral Daily   clopidogrel   75 mg Oral Daily   feeding supplement   237 mL Oral BID BM   folic acid   1 mg Oral Daily   multivitamin with minerals  1 tablet Oral Daily   thiamine   100 mg Oral Daily   Or   thiamine   100 mg Intravenous Daily    Continuous Infusions:   LOS: 5 days     Lebron JINNY Cage, MD [facility]  If 7PM-7AM, please contact night-coverage [email or web address] 07/09/2024, 4:01 PM

## 2024-07-09 NOTE — Plan of Care (Signed)

## 2024-07-09 NOTE — Consult Note (Signed)
 Hospital Consult    Reason for Consult: Symptomatic right ICA stenosis Referring Physician: Dr. Jerri neurology MRN #:  [id number]  History of Present Illness: This is a 73 y.o. male with history of hypertension, hyperlipidemia, tobacco abuse, EtOH abuse, hep C/liver carcinoma status post chemo in 2019 that vascular surgery has been consulted for evaluation of symptomatic right ICA carotid stenosis.  Patient states has been having lower extremity weakness more pronounced on the left leg.  States that his had trouble walking as a result.  MRI brain yesterday with subacute infarcts in the right internal capsule and temporal lobe with evolution of nonhemorrhagic infarct.  CTA neck did show plaque in the right carotid bulb that radiology initially felt was less than 50%.  I was contacted by the neurologist on-call who felt that his carotid disease likely was the cause of his stroke and he felt that the plaque was more pronounced.  Was being evaluated for paraneoplastic syndrome given his MRI findings at admission but this is now less likely per neurology.  Denies any prior neck surgery or neck radiation.  Past Medical History:  Diagnosis Date   Alcohol abuse 10/07/2017   Alcoholism (HCC)    Aortic atherosclerosis    Arthritis    Cancer (HCC)    liver   Cataract    Chronic hepatitis C without hepatic coma (HCC) 09/19/2014   GERD (gastroesophageal reflux disease)    Hepatitis C    Hepatocellular carcinoma (HCC) 06/09/2018   HLD (hyperlipidemia)    Hypertension    Liver mass    Pulmonary nodule    Substance abuse (HCC)     Past Surgical History:  Procedure Laterality Date   COLONOSCOPY WITH PROPOFOL  N/A 01/30/2014   Procedure: COLONOSCOPY WITH PROPOFOL ;  Surgeon: Gladis MARLA Louder, MD;  Location: [facility];  Service: Endoscopy;  Laterality: N/A;   ESOPHAGOGASTRODUODENOSCOPY (EGD) WITH PROPOFOL  N/A 01/30/2014   Procedure: ESOPHAGOGASTRODUODENOSCOPY (EGD) WITH PROPOFOL ;  Surgeon: Gladis MARLA Louder, MD;  Location: [facility];  Service: Endoscopy;  Laterality: N/A;   IR ANGIOGRAM SELECTIVE EACH ADDITIONAL VESSEL  07/19/2018   IR ANGIOGRAM SELECTIVE EACH ADDITIONAL VESSEL  07/19/2018   IR ANGIOGRAM SELECTIVE EACH ADDITIONAL VESSEL  07/19/2018   IR ANGIOGRAM SELECTIVE EACH ADDITIONAL VESSEL  07/19/2018   IR ANGIOGRAM SELECTIVE EACH ADDITIONAL VESSEL  07/19/2018   IR ANGIOGRAM VISCERAL SELECTIVE  07/19/2018   IR EMBO TUMOR ORGAN ISCHEMIA INFARCT INC GUIDE ROADMAPPING  07/19/2018   IR RADIOLOGIST EVAL & MGMT  07/07/2018   IR RADIOLOGIST EVAL & MGMT  08/16/2018   IR RADIOLOGIST EVAL & MGMT  01/31/2019   IR RADIOLOGIST EVAL & MGMT  04/25/2019   IR RADIOLOGIST EVAL & MGMT  07/20/2019   IR RADIOLOGIST EVAL & MGMT  10/17/2019   IR RADIOLOGIST EVAL & MGMT  01/30/2020   IR RADIOLOGIST EVAL & MGMT  08/14/2020   IR RADIOLOGIST EVAL & MGMT  02/24/2022   IR RADIOLOGIST EVAL & MGMT  08/11/2022   IR RADIOLOGIST EVAL & MGMT  04/27/2023   IR RADIOLOGIST EVAL & MGMT  10/28/2023   IR US  GUIDE VASC ACCESS LEFT  07/19/2018   left jaw surgery      MANDIBLE FRACTURE SURGERY      No Known Allergies  Prior to Admission medications   Medication Sig Start Date End Date Taking? Authorizing Provider  amLODipine  (NORVASC ) 5 MG tablet Take 1 tablet (5 mg total) by mouth daily. 04/13/24  Yes Newlin, Enobong, MD  atorvastatin  (LIPITOR) 20  MG tablet Take 1 tablet (20 mg total) by mouth daily. 04/13/24  Yes Newlin, Enobong, MD  acetaminophen  (TYLENOL ) 500 MG tablet Take 2 tablets (1,000 mg total) by mouth in the morning. 07/09/24   Ezenduka, Nkeiruka J, MD  aspirin  81 MG chewable tablet Chew 1 tablet (81 mg total) by mouth daily. 07/10/24   Ezenduka, Nkeiruka J, MD  clopidogrel  (PLAVIX ) 75 MG tablet Take 1 tablet (75 mg total) by mouth daily. 07/09/24 10/07/24  Donnamarie Lebron PARAS, MD  folic acid  (FOLVITE ) 1 MG tablet Take 1 tablet (1 mg total) by mouth daily. 07/10/24   Ezenduka, Nkeiruka J, MD  Multiple Vitamin  (MULTIVITAMIN WITH MINERALS) TABS tablet Take 1 tablet by mouth daily. 07/10/24   Ezenduka, Nkeiruka J, MD  thiamine  (VITAMIN B-1) 100 MG tablet Take 1 tablet (100 mg total) by mouth daily. 07/10/24   Donnamarie Lebron PARAS, MD    Social History   Socioeconomic History   Marital status: Single    Spouse name: Not on file   Number of children: 1   Years of education: Not on file   Highest education level: Not on file  Occupational History   Occupation: works in roofing    Occupation: Retired  Tobacco Use   Smoking status: Some Days    Current packs/day: 1.00    Average packs/day: 1 pack/day for 50.0 years (50.0 ttl pk-yrs)    Types: Cigarettes   Smokeless tobacco: Never   Tobacco comments:    trying to quit, smoke every other day  Vaping Use   Vaping status: Never Used  Substance and Sexual Activity   Alcohol use: Yes    Alcohol/week: 0.0 standard drinks of alcohol    Comment: 2-3 40 oz beers per day, x50 years   Drug use: No    Comment: remote IV drug use age 63    Sexual activity: Yes    Partners: Female, Male    Comment: patient declines  Other Topics Concern   Not on file  Social History Narrative   Not on file   Social Drivers of Health   Financial Resource Strain: Low Risk  (04/27/2023)   Overall Financial Resource Strain (CARDIA)    Difficulty of Paying Living Expenses: Not hard at all  Food Insecurity: No Food Insecurity (07/04/2024)   Hunger Vital Sign    Worried About Running Out of Food in the Last Year: Never true    Ran Out of Food in the Last Year: Never true  Transportation Needs: No Transportation Needs (07/04/2024)   PRAPARE - Administrator, Civil Service (Medical): No    Lack of Transportation (Non-Medical): No  Physical Activity: Sufficiently Active (04/27/2023)   Exercise Vital Sign    Days of Exercise per Week: 7 days    Minutes of Exercise per Session: 30 min  Stress: No Stress Concern Present (04/27/2023)   [employer] of  Occupational Health - Occupational Stress Questionnaire    Feeling of Stress : Not at all  Social Connections: Socially Isolated (07/06/2024)   Social Connection and Isolation Panel    Frequency of Communication with Friends and Family: More than three times a week    Frequency of Social Gatherings with Friends and Family: More than three times a week    Attends Religious Services: Never    Database Administrator or Organizations: No    Attends Banker Meetings: Never    Marital Status: Divorced  Catering Manager Violence: Not At  Risk (07/04/2024)   Humiliation, Afraid, Rape, and Kick questionnaire    Fear of Current or Ex-Partner: No    Emotionally Abused: No    Physically Abused: No    Sexually Abused: No     Family History  Problem Relation Age of Onset   Cancer Mother        type unknown   Emphysema Brother    Colon cancer Neg Hx    Esophageal cancer Neg Hx    Pancreatic cancer Neg Hx    Rectal cancer Neg Hx    Stomach cancer Neg Hx    Colon polyps Neg Hx     ROS: [x]  Positive   [ ]  Negative   [ ]  All sytems reviewed and are negative  Cardiovascular: []  chest pain/pressure []  palpitations []  SOB lying flat []  DOE []  pain in legs while walking []  pain in legs at rest []  pain in legs at night []  non-healing ulcers []  hx of DVT []  swelling in legs  Pulmonary: []  productive cough []  asthma/wheezing []  home O2  Neurologic: []  weakness in []  arms []  legs []  numbness in []  arms []  legs []  hx of CVA []  mini stroke [] difficulty speaking or slurred speech []  temporary loss of vision in one eye []  dizziness  Hematologic: []  hx of cancer []  bleeding problems []  problems with blood clotting easily  Endocrine:   []  diabetes []  thyroid disease  GI []  vomiting blood []  blood in stool  GU: []  CKD/renal failure []  HD--[]  M/W/F or []  T/T/S []  burning with urination []  blood in urine  Psychiatric: []  anxiety []   depression  Musculoskeletal: []  arthritis []  joint pain  Integumentary: []  rashes []  ulcers  Constitutional: []  fever []  chills   Physical Examination  Vitals:   07/09/24 0109 07/09/24 0801  BP: 98/64 (!) 137/90  Pulse: 73 84  Resp: 18 18  Temp:  98.4 F (36.9 C)  SpO2: 100% 100%   Body mass index is 20.16 kg/m.  General:  WDWN in NAD Gait: Not observed HENT: WNL, normocephalic Pulmonary: normal non-labored breathing Cardiac: regular, without  Murmurs, rubs or gallops Abdomen:  soft, NT/ND Vascular Exam/Pulses: Bilateral DP pulses palpable Extremities: without ischemic changes Musculoskeletal: no muscle wasting or atrophy  Neurologic: A&O X 3; Appropriate Affect ; SENSATION: normal;  Strength 5 out of 5 in the right upper and lower extremity.  4+ out of 5 in the left upper and lower extremity.   CBC    Component Value Date/Time   WBC 7.7 07/05/2024 0455   RBC 4.43 07/05/2024 0455   HGB 13.1 07/05/2024 0455   HGB 13.5 06/12/2019 1156   HGB 13.0 05/16/2018 1219   HCT 39.8 07/05/2024 0455   HCT 39.1 05/16/2018 1219   PLT 283 07/05/2024 0455   PLT 266 06/12/2019 1156   PLT 199 05/16/2018 1219   MCV 89.8 07/05/2024 0455   MCV 91 05/16/2018 1219   MCH 29.6 07/05/2024 0455   MCHC 32.9 07/05/2024 0455   RDW 13.0 07/05/2024 0455   RDW 12.1 (L) 05/16/2018 1219   LYMPHSABS 1.6 07/04/2024 1045   LYMPHSABS 1.0 05/16/2018 1219   MONOABS 1.0 07/04/2024 1045   EOSABS 0.3 07/04/2024 1045   EOSABS 0.0 05/16/2018 1219   BASOSABS 0.1 07/04/2024 1045   BASOSABS 0.1 05/16/2018 1219    BMET    Component Value Date/Time   NA 138 07/05/2024 0455   NA 132 (L) 04/13/2024 1016   K 4.0 07/05/2024 0455  CL 102 07/05/2024 0455   CO2 27 07/05/2024 0455   GLUCOSE 105 (H) 07/05/2024 0455   BUN 6 (L) 07/05/2024 0455   BUN 11 04/13/2024 1016   CREATININE 0.77 07/05/2024 0455   CREATININE 0.86 08/12/2020 0759   CALCIUM  9.7 07/05/2024 0455   GFRNONAA >60 07/05/2024 0455    GFRNONAA 90 08/12/2020 0759   GFRAA 104 08/12/2020 0759    COAGS: Lab Results  Component Value Date   INR 1.0 08/12/2020   INR 0.9 01/15/2020   INR 1.0 07/12/2019     Non-Invasive Vascular Imaging:    CTA neck on my review shows over 50% right ICA stenosis with significant plaque  MRI brain yesterday  IMPRESSION: 1. Expected evolution of subacute nonhemorrhagic infarcts in the posterior limb right internal capsule and right temporal lobe, with extensive T2 and FLAIR hyperintensity in the right temporal lobe without associated enhancement. No new or progressive infarct. 2. Extensive T2 hyperintensities in the central pons.   Electronically signed by: Lonni Necessary MD 07/08/2024 06:43 PM EST RP Workstation: [id number]  ASSESSMENT/PLAN: This is a 73 y.o. male with history of hypertension, hyperlipidemia, tobacco abuse, EtOH abuse, hep C/liver carcinoma status post chemo in 2019 that vascular surgery has been consulted for evaluation of symptomatic right internal carotid artery stenosis.  Patient states has been having lower extremity weakness more pronounced on the left leg ongoing for several weeks.  States has had trouble walking as a result.  MRI brain yesterday with subacute infarcts in the right internal capsule and temporal lobe.  CTA neck did show plaque in the right carotid bulb that radiology initially felt was less than 50%.  I was contacted by Dr. Jerri the neurologist on-call who felt that his right carotid stenosis was likely was the cause of his stroke and he felt that the plaque was more pronounced than 50%.  Was being evaluated for paraneoplastic syndrome given his MRI findings but this is now less likely per neurology.  I reviewed the CTA neck and I agree with Dr. Jerri that I think he has over 50% right ICA plaque.  Will order carotid duplex to confirm and evaluate velocity criteria.  I discussed if over 50% stenosis and felt to be the etiology for his right brain  stroke would recommend carotid revascularization with options being carotid endarterectomy and or TCAR with stenting using flow reversal.  I think he would be a good TCAR candidate and he is on aspirin  Plavix  statin already.  We will follow.  Discussed carotid surgery carries a 1% stroke risk.  Lonni DOROTHA Gaskins, MD [facility] Office: [phone]  Lonni JINNY Gaskins

## 2024-07-10 ENCOUNTER — Inpatient Hospital Stay (HOSPITAL_COMMUNITY)

## 2024-07-10 DIAGNOSIS — I639 Cerebral infarction, unspecified: Secondary | ICD-10-CM | POA: Diagnosis not present

## 2024-07-10 NOTE — Plan of Care (Signed)
   Problem: Education: Goal: Knowledge of disease or condition will improve Outcome: Progressing Goal: Knowledge of secondary prevention will improve (MUST DOCUMENT ALL) Outcome: Progressing Goal: Knowledge of patient specific risk factors will improve (DELETE if not current risk factor) Outcome: Progressing

## 2024-07-10 NOTE — Progress Notes (Signed)
 [facility]  Subjective  -no complaints   Objective 118/86 84 (!) 97.5 F (36.4 C) (Oral) 19 100% No intake or output data in the 24 hours ending 07/10/24 1031  Neurologic: A&O X 3; Appropriate Affect ; SENSATION: normal;  Strength 5 out of 5 in the right upper and lower extremity.  4+ out of 5 in the left upper and lower extremity.  Laboratory Lab Results: No results for input(s): WBC, HGB, HCT, PLT in the last 72 hours. BMET No results for input(s): NA, K, CL, CO2, GLUCOSE, BUN, CREATININE, CALCIUM  in the last 72 hours.  COAG Lab Results  Component Value Date   INR 1.0 08/12/2020   INR 0.9 01/15/2020   INR 1.0 07/12/2019   No results found for: PTT  Assessment/Planning:  73 y.o. male with history of hypertension, hyperlipidemia, tobacco abuse, EtOH abuse, hep C/liver carcinoma status post chemo in 2019 that vascular surgery has been consulted for evaluation of symptomatic right internal carotid artery stenosis.  Patient states has been having lower extremity weakness more pronounced on the left leg ongoing for several weeks.  States has had trouble walking as a result.  MRI brainwith subacute infarcts in the right internal capsule and temporal lobe.  CTA neck did show plaque in the right carotid bulb that radiology initially felt was less than 50%.  I was contacted by Dr. Jerri the neurologist on-call who felt that his right carotid stenosis was likely was the cause of his stroke and he felt that the plaque was more pronounced than 50%.   Was being evaluated for paraneoplastic syndrome given his MRI findings but this is now less likely per neurology.   I reviewed his images with several my partners.  We all agree he has a greater than 50% right carotid plaque that is likely the etiology for his right brain stroke.  Plaque looks high risk as well.  Plan carotid revascularization with TCAR using angioplasty and stenting with  flow reversal on Wednesday in the OR.  Continue aspirin  Plavix  statin.  Discussed 1% risk stroke.  Lonni JINNY Gaskins 07/10/2024 10:31 AM --

## 2024-07-10 NOTE — Plan of Care (Signed)

## 2024-07-10 NOTE — TOC Progression Note (Signed)
 Transition of Care [facility]) - Progression Note    Patient Details  Name: Calvin Gardner MRN: [id number] Date of Birth: 1950-12-01  Transition of [facility] CM/SW Contact  Andrez JULIANNA George, RN Phone Number: 07/10/2024, 1:56 PM  Clinical Narrative:     Outpatient therapy arranged with Ruthell[facility] Kidd. Information on the AVS. Pt will call to schedule the first appointment.  Walker for home is at the bedside.  Number for patients [employer] transportation added to the AVS.  Pt will need cab home at dc.  IP Care management following.  Expected Discharge Plan: OP Rehab                 Expected Discharge Plan and Services                                               Social Drivers of Health (SDOH) Interventions SDOH Screenings   Food Insecurity: No Food Insecurity (07/04/2024)  Housing: Low Risk  (07/06/2024)  Transportation Needs: No Transportation Needs (07/04/2024)  Utilities: Not At Risk (07/04/2024)  Alcohol Screen: Low Risk  (04/27/2023)  Depression (PHQ2-9): Low Risk  (10/12/2023)  Financial Resource Strain: Low Risk  (04/27/2023)  Physical Activity: Sufficiently Active (04/27/2023)  Social Connections: Socially Isolated (07/06/2024)  Stress: No Stress Concern Present (04/27/2023)  Tobacco Use: High Risk (07/04/2024)  Health Literacy: Adequate Health Literacy (04/27/2023)    Readmission Risk Interventions    07/06/2024   11:44 AM  Readmission Risk Prevention Plan  Post Dischage Appt Complete  Medication Screening Complete

## 2024-07-10 NOTE — Progress Notes (Signed)
 STROKE TEAM PROGRESS NOTE   INTERIM HISTORY/SUBJECTIVE No family at the bedside. Patient sitting in bed, doing well. VVS Dr Gretta saw the pt yerterday and plan for TCAR on Wednesday  CBC    Component Value Date/Time   WBC 7.7 07/05/2024 0455   RBC 4.43 07/05/2024 0455   HGB 13.1 07/05/2024 0455   HGB 13.5 06/12/2019 1156   HGB 13.0 05/16/2018 1219   HCT 39.8 07/05/2024 0455   HCT 39.1 05/16/2018 1219   PLT 283 07/05/2024 0455   PLT 266 06/12/2019 1156   PLT 199 05/16/2018 1219   MCV 89.8 07/05/2024 0455   MCV 91 05/16/2018 1219   MCH 29.6 07/05/2024 0455   MCHC 32.9 07/05/2024 0455   RDW 13.0 07/05/2024 0455   RDW 12.1 (L) 05/16/2018 1219   LYMPHSABS 1.6 07/04/2024 1045   LYMPHSABS 1.0 05/16/2018 1219   MONOABS 1.0 07/04/2024 1045   EOSABS 0.3 07/04/2024 1045   EOSABS 0.0 05/16/2018 1219   BASOSABS 0.1 07/04/2024 1045   BASOSABS 0.1 05/16/2018 1219    BMET    Component Value Date/Time   NA 138 07/05/2024 0455   NA 132 (L) 04/13/2024 1016   K 4.0 07/05/2024 0455   CL 102 07/05/2024 0455   CO2 27 07/05/2024 0455   GLUCOSE 105 (H) 07/05/2024 0455   BUN 6 (L) 07/05/2024 0455   BUN 11 04/13/2024 1016   CREATININE 0.77 07/05/2024 0455   CREATININE 0.86 08/12/2020 0759   CALCIUM  9.7 07/05/2024 0455   EGFR 73 04/13/2024 1016   GFRNONAA >60 07/05/2024 0455   GFRNONAA 90 08/12/2020 0759    IMAGING past 24 hours VAS US  CAROTID Result Date: 07/09/2024 Carotid Arterial Duplex Study Patient Name:  LESEAN WOOLVERTON  Date of Exam:   07/09/2024 Medical Rec #: [id number]          Accession #:    [id number] Date of Birth: 12/11/1950          Patient Gender: M Patient Age:   73 years Exam Location:  [facility] Procedure:      VAS US  CAROTID Referring Phys: LONNI GRETTA --------------------------------------------------------------------------------  Indications:       CVA, Weakness and CTA of the neck indicates significant                    plaque and >50% right ICA  stenosis. Risk Factors:      Hypertension, hyperlipidemia, current smoker. Other Factors:     ETOH abuse, history of Hepititis C and liver carcinoma,                    status post chemotherapy 2019. Comparison Study:  No prior study on file Performing Technologist: Alberta Lis RVS  Examination Guidelines: A complete evaluation includes B-mode imaging, spectral Doppler, color Doppler, and power Doppler as needed of all accessible portions of each vessel. Bilateral testing is considered an integral part of a complete examination. Limited examinations for reoccurring indications may be performed as noted.  Right Carotid Findings: +----------+--------+--------+--------+------------------+------------------+           PSV cm/sEDV cm/sStenosisPlaque DescriptionComments           +----------+--------+--------+--------+------------------+------------------+ CCA Prox  91      13                                intimal thickening +----------+--------+--------+--------+------------------+------------------+ CCA Distal76      17  calcific                             +----------+--------+--------+--------+------------------+------------------+ ICA Prox  103     21              heterogenous      Shadowing          +----------+--------+--------+--------+------------------+------------------+ ICA Mid   66      21                                                   +----------+--------+--------+--------+------------------+------------------+ ICA Distal66      18                                                   +----------+--------+--------+--------+------------------+------------------+ ECA       69      13                                                   +----------+--------+--------+--------+------------------+------------------+ +----------+--------+-------+--------+-------------------+           PSV cm/sEDV cmsDescribeArm Pressure (mmHG)  +----------+--------+-------+--------+-------------------+ [id number]                                         +----------+--------+-------+--------+-------------------+ +---------+--------+--+--------+--+ VertebralPSV cm/s53EDV cm/s15 +---------+--------+--+--------+--+  Left Carotid Findings: +----------+--------+--------+--------+------------------+------------------+           PSV cm/sEDV cm/sStenosisPlaque DescriptionComments           +----------+--------+--------+--------+------------------+------------------+ CCA Prox  118     16                                intimal thickening +----------+--------+--------+--------+------------------+------------------+ CCA Distal132     20                                intimal thickening +----------+--------+--------+--------+------------------+------------------+ ICA Prox  77      21      1-39%   calcific          Shadowing          +----------+--------+--------+--------+------------------+------------------+ ICA Mid   66      16                                                   +----------+--------+--------+--------+------------------+------------------+ ICA Distal70      24                                                   +----------+--------+--------+--------+------------------+------------------+ ECA  87      11                                                   +----------+--------+--------+--------+------------------+------------------+ +----------+--------+--------+--------+-------------------+           PSV cm/sEDV cm/sDescribeArm Pressure (mmHG) +----------+--------+--------+--------+-------------------+ [id number]                                         +----------+--------+--------+--------+-------------------+ +---------+--------+--+--------+--+ VertebralPSV cm/s56EDV cm/s16 +---------+--------+--+--------+--+   Summary:   *See table(s) above for measurements and  observations.  Electronically signed by Debby Robertson on 07/09/2024 at 8:41:12 PM.    Final     Vitals:   07/09/24 1604 07/09/24 1958 07/10/24 0735 07/10/24 1215  BP: 120/78 103/72 118/86 130/80  Pulse: 75 79 84 70  Resp: 16 16 19 19   Temp: 98.8 F (37.1 C) 98 F (36.7 C) (!) 97.5 F (36.4 C) 98 F (36.7 C)  TempSrc: Oral Oral Oral Oral  SpO2: 100% 100% 100% 100%  Weight:      Height:         PHYSICAL EXAM General:  Alert, well-nourished, well-developed patient in no acute distress Psych:  Mood and affect appropriate for situation CV: Regular rate and rhythm on monitor Respiratory:  Regular, unlabored respirations on room air GI: Abdomen soft and nontender   NEURO:  Mental Status: AA&Ox3, patient is able to give clear and coherent history Speech/Language: speech is without dysarthria or aphasia.  Naming, repetition, fluency, and comprehension intact.  Cranial Nerves:  II: PERRL. Visual fields with left hemianopia  III, IV, VI: EOMI. Eyelids elevate symmetrically.  V: Sensation is intact to light touch and symmetrical to face.  VII: Face is symmetrical resting and smiling VIII: hearing intact to voice. IX, X: Palate elevates symmetrically. Phonation is normal.  KP:Dynloizm shrug 5/5. XII: tongue is midline without fasciculations. Motor: 5/5 strength to all muscle groups tested.  Trace weakness left and finger movements in the left hand.  RIGHT to left extremity.  Symmetric grip strength laterally. Tone: is normal and bulk is normal Sensation- Intact to light touch bilaterally. Extinction absent to light touch to DSS.   Coordination: FTN intact bilaterally, HKS: no ataxia in BLE.No drift. Decreased fine finger movements on left hand  Deep tendon reflexes brisker on the left compared to the right. Gait- deferred   ASSESSMENT/PLAN  Mr. ZYEN TRIGGS is a 73 y.o. male with history of alcohol abuse, aortic atherosclerosis, arthritis, chronic hepatitis C, liver cancer,  cataract, HLD, HTN, pulmonary nodule and substance abuse who presented to the ED this morning with symptoms of intermittent headache and blurred vision for 2 weeks. During that time period, he experienced some falls and also frequently was running into things with the left side of his body.  Stroke: R MCA scattered subacute Infarcts including R optic radiation, etiology likely large vessel disease from R ICA stenosis CT head No acute abnormality.  CTA head & neck: Plaque in the right carotid bulb with less than 50% stenosis. Calcified plaque in the left carotid bulb without significant stenosis MRI: Patchy and indistinct combined acute and subacute ischemia in the posterior Right internal capsule, adjacent deep gray nuclei and temporal lobe white matter. No hemorrhagic transformation or mass effect.  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 MRI brain w contrast no abnormal enhancement MRI w/wo repeat no significant change 2D Echo EF 55 to 60%.  Left atrial size normal LDL 56 HgbA1c 5.3 UDS pending VTE prophylaxis - SCD's No antithrombotic prior to admission, now on  ASA 81 and plavix  75 DAPT. Further regimen per VVS. Therapy recommendations:  Outpt PT OT Disposition:  pending   R ICA stenosis CTA head and neck showed R ICA extensive soft plaque with stenosis approaching 50% Seems to be unstable plaque with R MCA scattered infarcts VVS Dr. Gretta consulted, planning for TCAR wednesday On DAPT now CUS pending  Hypertension Home meds:  amlodipine  5mg   Stable Long term BP goal normotensive  Hyperlipidemia Home meds:  atorvastatin  20mg , resumed in hospital LDL 56, goal < 70 Continue statin at discharge  Tobacco Abuse Patient smokes 10-15 cigarettes per day for years      Ready to quit? No Nicotine  replacement  therapy provided Cessation education provided  ETOH abuse  CIWA protocol  Drinking 96 to 120 ounces of beer daily  ETOH use advised to drink no more than 2 drink(s) a day Folate and thiamine  and MVI daily   Other stroke risk factors Advanced age  Other Active Problems History of hep C/liver carcinoma s/p chemo in 2019, now in remission  Hospital day # 6   Ary Cummins, MD PhD Stroke Neurology 07/10/2024 12:33 PM   To contact Stroke Continuity provider, please refer to [email or web address]. After hours, contact General Neurology

## 2024-07-10 NOTE — Progress Notes (Signed)
 Occupational Therapy Treatment Patient Details Name: Calvin Gardner MRN: [id number] DOB: [birth year] Today's Date: 07/10/2024   History of present illness 73 yo male presented 12/2 HA, blurred vision, dizziness and falls CT (+) acute territorial infarct, MRI (+) acute subacute ischemia in posterior R internal capsule, temporal lobe, small subacute infarct in R MCA motor strip PMH ETOH abuse, aortic atherosclerosis, OA, Hep C, liver CA, cataract,HLD, HTN, Pulmonary nodule, substance abuse, smoker   OT comments  Patient demonstrating good gains with OT treatment with bed mobility, self care and functional transfers. Patient was supervision only for safety and without an assistive device.  Discharge recommendations for OPOT continues to be appropriate.  Acute OT to continue to follow to address established goals.       If plan is discharge home, recommend the following:  A little help with bathing/dressing/bathroom;Assistance with cooking/housework;Assist for transportation   Equipment Recommendations  None recommended by OT    Recommendations for Other Services      Precautions / Restrictions Precautions Precautions: Fall Recall of Precautions/Restrictions: Intact Precaution/Restrictions Comments: SBP <180 Restrictions Weight Bearing Restrictions Per Provider Order: No       Mobility Bed Mobility Overal bed mobility: Independent             General bed mobility comments: able to get to EOB without assistance    Transfers Overall transfer level: Modified independent Equipment used: None               General transfer comment: able to perform transfers with supervision for safety only     Balance Overall balance assessment: Needs assistance Sitting-balance support: No upper extremity supported, Feet supported Sitting balance-Leahy Scale: Good Sitting balance - Comments: sitting EOB   Standing balance support: No upper extremity supported, Single extremity  supported, During functional activity Standing balance-Leahy Scale: Fair Standing balance comment: stood at sink and for LB self care with no LOB                           ADL either performed or assessed with clinical judgement   ADL Overall ADL's : Needs assistance/impaired     Grooming: Wash/dry hands;Wash/dry face;Supervision/safety;Standing   Upper Body Bathing: Supervision/ safety;Standing   Lower Body Bathing: Supervison/ safety;Sit to/from stand   Upper Body Dressing : Set up Upper Body Dressing Details (indicate cue type and reason): gown Lower Body Dressing: Supervision/safety;Sit to/from stand Lower Body Dressing Details (indicate cue type and reason): socks and pull up brief Toilet Transfer: Ambulation;Regular Toilet;Supervision/safety   Toileting- Clothing Manipulation and Hygiene: Supervision/safety       Functional mobility during ADLs: Supervision/safety General ADL Comments: supervision for safety with all self care tasks    Extremity/Trunk Assessment              Vision   Tracking/Visual Pursuits: Able to track stimulus in all quads without difficulty   Perception     Praxis     Communication Communication Communication: No apparent difficulties   Cognition Arousal: Alert Behavior During Therapy: WFL for tasks assessed/performed, Impulsive Cognition: No apparent impairments                               Following commands: Impaired Following commands impaired: Follows multi-step commands inconsistently, Follows one step commands inconsistently, Follows one step commands with increased time      Cueing   Cueing Techniques: Verbal cues, Tactile cues,  Visual cues  Exercises      Shoulder Instructions       General Comments VSS on RA    Pertinent Vitals/ Pain       Pain Assessment Pain Assessment: No/denies pain  Home Living                                          Prior  Functioning/Environment              Frequency  Min 2X/week        Progress Toward Goals  OT Goals(current goals can now be found in the care plan section)  Progress towards OT goals: Progressing toward goals  Acute Rehab OT Goals Patient Stated Goal: to go home OT Goal Formulation: With patient Time For Goal Achievement: 07/19/24 Potential to Achieve Goals: Good ADL Goals Pt Will Perform Grooming: Independently;standing Pt Will Perform Lower Body Dressing: Independently;sitting/lateral leans;sit to/from stand Pt Will Perform Toileting - Clothing Manipulation and hygiene: Independently;sitting/lateral leans;sit to/from stand Additional ADL Goal #1: Pt will verbalize 3 visual compensatory strategies during ADLs and functional mobility to reduce falls risk.  Plan      Co-evaluation                 AM-PAC OT 6 Clicks Daily Activity     Outcome Measure   Help from another person eating meals?: None Help from another person taking care of personal grooming?: A Little Help from another person toileting, which includes using toliet, bedpan, or urinal?: A Little Help from another person bathing (including washing, rinsing, drying)?: A Little Help from another person to put on and taking off regular upper body clothing?: None Help from another person to put on and taking off regular lower body clothing?: A Little 6 Click Score: 20    End of Session Equipment Utilized During Treatment: Gait belt  OT Visit Diagnosis: Unsteadiness on feet (R26.81);Other abnormalities of gait and mobility (R26.89);Repeated falls (R29.6)   Activity Tolerance Patient tolerated treatment well   Patient Left in chair;with call bell/phone within reach;with chair alarm set   Nurse Communication Mobility status        Time: 9281-9259 OT Time Calculation (min): 22 min  Charges: OT General Charges $OT Visit: 1 Visit OT Treatments $Self Care/Home Management : 8-22 mins  Dick Gardner,  [facility] Acute Rehabilitation Services  Office [phone]   Calvin Gardner 07/10/2024, 9:25 AM

## 2024-07-10 NOTE — Progress Notes (Signed)
 PROGRESS NOTE  Calvin Gardner FMW:[id number] DOB: [birth year] DOA: 07/04/2024 PCP: Delbert Clam, MD  HPI/Recap of past 24 hours: Calvin Gardner is a 73 y.o. male with medical history significant of alcoholism, history of hep C, liver cancer, GERD, hyperlipidemia, hypertension, pulmonary nodule, substance abuse who presented to the emergency department with complaints of an intermittent headache associated with blurred vision, dizziness and falls with reported injuries that started about 2 weeks ago. CT head showed no acute intracranial abnormality. MRI of the brain showed patchy and indistinct combined acute and subacute ischemia in the posterior right internal capsule, adjacent deep gray nuclei and temporal lobe white matter. 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  Neurology consulted, patient admitted for further management.     Pt denies any new complaints. Plan carotid revascularization with vascular surgery on 12/10    Assessment/Plan: Principal Problem:   Acute cerebrovascular accident (CVA) (HCC) Active Problems:   Essential hypertension, benign   Tobacco abuse   IFG (impaired fasting glucose)   Alcohol abuse   GERD (gastroesophageal reflux disease)   Pulmonary nodule   Acute and subacute CVA MRI brain revealed acute and subacute right MCA territory strokes involving the posterior Right internal capsule, adjacent deep gray nuclei and temporal lobe white matter MRI brain with contrast, showed no abnormal intracranial enhancement suggest any lymphoma or paraneoplastic syndrome as per neurology CTA head and neck with no significant vertebrobasilar or intracranial disease (scan reviewed by Vascular/Neurology agree with over 50% R ICA plaque), carotid Doppler with similar findings. Vascular on board, plan for carotid revascularization on 12/10 Neurology/stroke team on board, discussed with Dr. Jerri, recommend at least 3  months of DTAP Vs per vascular surgery Echo showed EF of 55 to 60%, no regional wall motion abnormality, left ventricular diastolic parameters normal LDL 56, A1c 5.3 Continue aspirin , Plavix , statins PT/OT/SLP, recommend outpatient PT/OT Frequent neurochecks  Essential hypertension BP stable Holding PTA amlodipine    Tobacco abuse Tobacco cessation advised. Nicotine  replacement therapy ordered.   Alcohol abuse Drinking 96 to 120 ounces of beer daily CIWA protocol with lorazepam . Magnesium sulfate supplementation. Folate, MVI and thiamine . Consult TOC team  History of hep C/liver carcinoma Outpatient follow-up  Pulmonary nodule Follow-up with PCP for pulmonary referral as an outpatient      Estimated body mass index is 20.16 kg/m as calculated from the following:   Height as of this encounter: 5' 6 (1.676 m).   Weight as of this encounter: 56.7 kg.     Code Status: Full  Family Communication: None at bedside  Disposition Plan: Status is: Inpatient Remains inpatient appropriate because: Level of care      Consultants: Neurology Vascular Surgery  Procedures: None  Antimicrobials: None  DVT prophylaxis: Lovenox    Objective: Vitals:   07/09/24 1201 07/09/24 1604 07/09/24 1958 07/10/24 0735  BP: 135/83 120/78 103/72 118/86  Pulse: 77 75 79 84  Resp: 18 16 16 19   Temp: 98.3 F (36.8 C) 98.8 F (37.1 C) 98 F (36.7 C) (!) 97.5 F (36.4 C)  TempSrc: Oral Oral Oral Oral  SpO2: 100% 100% 100% 100%  Weight:      Height:       No intake or output data in the 24 hours ending 07/10/24 1052  Filed Weights   07/04/24 1621  Weight: 56.7 kg    Exam: General: NAD  Cardiovascular: S1, S2 present Respiratory: CTAB Abdomen: Soft, nontender, nondistended, bowel sounds present Musculoskeletal: No  bilateral pedal edema noted Skin: Normal Psychiatry: Normal mood     Data Reviewed: CBC: Recent Labs  Lab 07/04/24 1045 07/05/24 0455  WBC 7.7 7.7   NEUTROABS 4.8  --   HGB 12.4* 13.1  HCT 38.1* 39.8  MCV 91.6 89.8  PLT 271 283   Basic Metabolic Panel: Recent Labs  Lab 07/04/24 0906 07/04/24 1937 07/05/24 0455  NA 135  --  138  K 4.2  --  4.0  CL 102  --  102  CO2 21*  --  27  GLUCOSE 104*  --  105*  BUN 10  --  6*  CREATININE 0.80  --  0.77  CALCIUM  10.1  --  9.7  MG 2.5* 2.0  --   PHOS 3.7 4.6  --    GFR: Estimated Creatinine Clearance: 67.9 mL/min (by C-G formula based on SCr of 0.77 mg/dL). Liver Function Tests: Recent Labs  Lab 07/04/24 0906 07/04/24 1937 07/05/24 0455  AST 43* 26 22  ALT 16 14 12   ALKPHOS 51 41 39  BILITOT 0.3 0.5 0.7  PROT 7.3 6.4* 6.4*  ALBUMIN 4.3 3.3* 3.5   No results for input(s): LIPASE, AMYLASE in the last 168 hours. No results for input(s): AMMONIA in the last 168 hours. Coagulation Profile: No results for input(s): INR, PROTIME in the last 168 hours. Cardiac Enzymes: No results for input(s): CKTOTAL, CKMB, CKMBINDEX, TROPONINI in the last 168 hours. BNP (last 3 results) No results for input(s): PROBNP in the last 8760 hours. HbA1C: No results for input(s): HGBA1C in the last 72 hours. CBG: Recent Labs  Lab 07/08/24 1709  GLUCAP 131*   Lipid Profile: No results for input(s): CHOL, HDL, LDLCALC, TRIG, CHOLHDL, LDLDIRECT in the last 72 hours.  Thyroid Function Tests: No results for input(s): TSH, T4TOTAL, FREET4, T3FREE, THYROIDAB in the last 72 hours. Anemia Panel: No results for input(s): VITAMINB12, FOLATE, FERRITIN, TIBC, IRON, RETICCTPCT in the last 72 hours. Urine analysis:    Component Value Date/Time   COLORURINE YELLOW 07/24/2018 1555   APPEARANCEUR CLEAR 07/24/2018 1555   LABSPEC 1.013 07/24/2018 1555   PHURINE 7.0 07/24/2018 1555   GLUCOSEU 50 (A) 07/24/2018 1555   HGBUR NEGATIVE 07/24/2018 1555   BILIRUBINUR NEGATIVE 07/24/2018 1555   KETONESUR NEGATIVE 07/24/2018 1555   PROTEINUR NEGATIVE  07/24/2018 1555   NITRITE NEGATIVE 07/24/2018 1555   LEUKOCYTESUR NEGATIVE 07/24/2018 1555   Sepsis Labs: @LABRCNTIP (procalcitonin:4,lacticidven:4)  )No results found for this or any previous visit (from the past 240 hours).    Studies: VAS US  CAROTID Result Date: 07/09/2024 Carotid Arterial Duplex Study Patient Name:  Calvin Gardner  Date of Exam:   07/09/2024 Medical Rec #: [id number]          Accession #:    [id number] Date of Birth: [birth year]          Patient Gender: M Patient Age:   73 years Exam Location:  [facility] [place] Procedure:      VAS US  CAROTID Referring Phys: LONNI GASKINS --------------------------------------------------------------------------------  Indications:       CVA, Weakness and CTA of the neck indicates significant                    plaque and >50% right ICA stenosis. Risk Factors:      Hypertension, hyperlipidemia, current smoker. Other Factors:     ETOH abuse, history of Hepititis C and liver carcinoma,  status post chemotherapy 2019. Comparison Study:  No prior study on file Performing Technologist: Alberta Lis RVS  Examination Guidelines: A complete evaluation includes B-mode imaging, spectral Doppler, color Doppler, and power Doppler as needed of all accessible portions of each vessel. Bilateral testing is considered an integral part of a complete examination. Limited examinations for reoccurring indications may be performed as noted.  Right Carotid Findings: +----------+--------+--------+--------+------------------+------------------+           PSV cm/sEDV cm/sStenosisPlaque DescriptionComments           +----------+--------+--------+--------+------------------+------------------+ CCA Prox  91      13                                intimal thickening +----------+--------+--------+--------+------------------+------------------+ CCA Distal76      17              calcific                              +----------+--------+--------+--------+------------------+------------------+ ICA Prox  103     21              heterogenous      Shadowing          +----------+--------+--------+--------+------------------+------------------+ ICA Mid   66      21                                                   +----------+--------+--------+--------+------------------+------------------+ ICA Distal66      18                                                   +----------+--------+--------+--------+------------------+------------------+ ECA       69      13                                                   +----------+--------+--------+--------+------------------+------------------+ +----------+--------+-------+--------+-------------------+           PSV cm/sEDV cmsDescribeArm Pressure (mmHG) +----------+--------+-------+--------+-------------------+ [id number]                                         +----------+--------+-------+--------+-------------------+ +---------+--------+--+--------+--+ VertebralPSV cm/s53EDV cm/s15 +---------+--------+--+--------+--+  Left Carotid Findings: +----------+--------+--------+--------+------------------+------------------+           PSV cm/sEDV cm/sStenosisPlaque DescriptionComments           +----------+--------+--------+--------+------------------+------------------+ CCA Prox  118     16                                intimal thickening +----------+--------+--------+--------+------------------+------------------+ CCA Distal132     20                                intimal thickening +----------+--------+--------+--------+------------------+------------------+ ICA Prox  77  21      1-39%   calcific          Shadowing          +----------+--------+--------+--------+------------------+------------------+ ICA Mid   66      16                                                    +----------+--------+--------+--------+------------------+------------------+ ICA Distal70      24                                                   +----------+--------+--------+--------+------------------+------------------+ ECA       87      11                                                   +----------+--------+--------+--------+------------------+------------------+ +----------+--------+--------+--------+-------------------+           PSV cm/sEDV cm/sDescribeArm Pressure (mmHG) +----------+--------+--------+--------+-------------------+ [id number]                                         +----------+--------+--------+--------+-------------------+ +---------+--------+--+--------+--+ VertebralPSV cm/s56EDV cm/s16 +---------+--------+--+--------+--+   Summary:   *See table(s) above for measurements and observations.  Electronically signed by Debby Robertson on 07/09/2024 at 8:41:12 PM.    Final      Scheduled Meds:  aspirin   81 mg Oral Daily   atorvastatin   20 mg Oral Daily   clopidogrel   75 mg Oral Daily   enoxaparin  (LOVENOX ) injection  40 mg Subcutaneous Q24H   feeding supplement  237 mL Oral BID BM   folic acid   1 mg Oral Daily   multivitamin with minerals  1 tablet Oral Daily   thiamine   100 mg Oral Daily   Or   thiamine   100 mg Intravenous Daily    Continuous Infusions:   LOS: 6 days     Lebron JINNY Cage, MD [facility]  If 7PM-7AM, please contact night-coverage [email or web address] 07/10/2024, 10:52 AM

## 2024-07-10 NOTE — Progress Notes (Signed)
 Physical Therapy Treatment Patient Details Name: Calvin Gardner MRN: [id number] DOB: 02/24/1951 Today's Date: 07/10/2024   History of Present Illness 73 yo male presented 12/2 HA, blurred vision, dizziness and falls CT (+) acute territorial infarct, MRI (+) acute subacute ischemia in posterior R internal capsule, temporal lobe, small subacute infarct in R MCA motor strip PMH ETOH abuse, aortic atherosclerosis, OA, Hep C, liver CA, cataract,HLD, HTN, Pulmonary nodule, substance abuse, smoker    PT Comments  Pt is progressing towards goals. Currently pt is supervision for gait w/o AD and stairs with rail. Pt requires safety cues throughout session and has poor safety awareness. No overt LOB during session. Pt had difficulty with scanning to the L looking for objects during gait. Due to pt current functional status, home set up and available assistance at home recommending skilled physical therapy services 3x/week in order to address strength, balance and functional mobility to decrease risk for falls, injury and re-hospitalization.      If plan is discharge home, recommend the following: Assist for transportation;Help with stairs or ramp for entrance     Equipment Recommendations  Rolling walker (2 wheels)       Precautions / Restrictions Precautions Precautions: Fall Recall of Precautions/Restrictions: Intact Precaution/Restrictions Comments: SBP <180 Restrictions Weight Bearing Restrictions Per Provider Order: No     Mobility  Bed Mobility Overal bed mobility: Independent             General bed mobility comments: able to get to EOB without assistance    Transfers Overall transfer level: Modified independent Equipment used: None               General transfer comment: able to perform transfers without physical assist    Ambulation/Gait Ambulation/Gait assistance: Supervision Gait Distance (Feet): 500 Feet Assistive device: None Gait Pattern/deviations:  Step-through pattern, Decreased stride length Gait velocity: decreased Gait velocity interpretation: >2.62 ft/sec, indicative of community ambulatory   General Gait Details: worked on scanning L with multi modal cues to look to the L during gait.   Stairs   Stairs assistance: Supervision Stair Management: Alternating pattern, Forwards, Two rails, One rail Right Number of Stairs: 2     Modified Rankin (Stroke Patients Only) Modified Rankin (Stroke Patients Only) Pre-Morbid Rankin Score: No symptoms Modified Rankin: Slight disability     Balance Overall balance assessment: Needs assistance Sitting-balance support: No upper extremity supported, Feet supported Sitting balance-Leahy Scale: Good Sitting balance - Comments: sitting EOB   Standing balance support: No upper extremity supported, Single extremity supported, During functional activity Standing balance-Leahy Scale: Fair          Hotel Manager: No apparent difficulties  Cognition Arousal: Alert Behavior During Therapy: WFL for tasks assessed/performed, Impulsive   PT - Cognitive impairments: No family/caregiver present to determine baseline, Awareness, Attention, Sequencing, Safety/Judgement       PT - Cognition Comments: slightly impulsive with moving quickly and decreased attention to task Following commands: Impaired Following commands impaired: Follows multi-step commands inconsistently, Follows one step commands inconsistently, Follows one step commands with increased time    Cueing Cueing Techniques: Verbal cues, Tactile cues, Visual cues     General Comments General comments (skin integrity, edema, etc.): vital signs stable on room air      Pertinent Vitals/Pain Pain Assessment Pain Assessment: No/denies pain     PT Goals (current goals can now be found in the care plan section) Acute Rehab PT Goals Patient Stated Goal: to work on balance PT  Goal Formulation: With  patient Time For Goal Achievement: 07/19/24 Potential to Achieve Goals: Good Progress towards PT goals: Progressing toward goals    Frequency    Min 2X/week      PT Plan  Continue with current POC        AM-PAC PT 6 Clicks Mobility   Outcome Measure  Help needed turning from your back to your side while in a flat bed without using bedrails?: None Help needed moving from lying on your back to sitting on the side of a flat bed without using bedrails?: None Help needed moving to and from a bed to a chair (including a wheelchair)?: None Help needed standing up from a chair using your arms (e.g., wheelchair or bedside chair)?: None Help needed to walk in hospital room?: A Little Help needed climbing 3-5 steps with a railing? : A Little 6 Click Score: 22    End of Session Equipment Utilized During Treatment: Gait belt Activity Tolerance: Patient tolerated treatment well Patient left: in bed;with call bell/phone within reach;with bed alarm set Nurse Communication: Mobility status PT Visit Diagnosis: Unsteadiness on feet (R26.81);Other abnormalities of gait and mobility (R26.89)     Time: 8678-8664 PT Time Calculation (min) (ACUTE ONLY): 14 min  Charges:    $Therapeutic Activity: 8-22 mins PT General Charges $$ ACUTE PT VISIT: 1 Visit                     [name], DPT, CLT  Acute Rehabilitation Services Office: [phone] (Secure chat preferred)    [name] 07/10/2024, 1:39 PM

## 2024-07-11 DIAGNOSIS — I639 Cerebral infarction, unspecified: Secondary | ICD-10-CM | POA: Diagnosis not present

## 2024-07-11 LAB — TYPE AND SCREEN
ABO/RH(D): A POS
Antibody Screen: NEGATIVE

## 2024-07-11 LAB — SURGICAL PCR SCREEN
MRSA, PCR: POSITIVE — AB
Staphylococcus aureus: POSITIVE — AB

## 2024-07-11 LAB — ABO/RH: ABO/RH(D): A POS

## 2024-07-11 MED ORDER — CHLORHEXIDINE GLUCONATE CLOTH 2 % EX PADS
6.0000 | MEDICATED_PAD | Freq: Every day | CUTANEOUS | Status: DC
  Administered 2024-07-11 – 2024-07-12 (×2): 6 via TOPICAL

## 2024-07-11 MED ORDER — SENNOSIDES-DOCUSATE SODIUM 8.6-50 MG PO TABS
1.0000 | ORAL_TABLET | Freq: Two times a day (BID) | ORAL | Status: DC
  Administered 2024-07-11 – 2024-07-15 (×3): 1 via ORAL
  Filled 2024-07-11 (×4): qty 1

## 2024-07-11 MED ORDER — POLYETHYLENE GLYCOL 3350 17 G PO PACK: 17.0000 g | PACK | Freq: Every day | ORAL | Status: DC

## 2024-07-11 MED ORDER — ORAL CARE MOUTH RINSE: 15.0000 mL | OROMUCOSAL | Status: DC | PRN

## 2024-07-11 MED ORDER — POLYETHYLENE GLYCOL 3350 17 G PO PACK
17.0000 g | PACK | Freq: Two times a day (BID) | ORAL | Status: DC
  Administered 2024-07-11 – 2024-07-15 (×4): 17 g via ORAL
  Filled 2024-07-11 (×5): qty 1

## 2024-07-11 MED ORDER — MUPIROCIN 2 % EX OINT
1.0000 | TOPICAL_OINTMENT | Freq: Two times a day (BID) | CUTANEOUS | Status: DC
  Administered 2024-07-11 – 2024-07-14 (×6): 1 via NASAL
  Filled 2024-07-11 (×3): qty 22

## 2024-07-11 NOTE — Progress Notes (Signed)
 STROKE TEAM PROGRESS NOTE   INTERIM HISTORY/SUBJECTIVE No family at the bedside. Patient lying in bed, no complaints, no acute event overnight.  Pending TCAR tomorrow.  CBC    Component Value Date/Time   WBC 7.7 07/05/2024 0455   RBC 4.43 07/05/2024 0455   HGB 13.1 07/05/2024 0455   HGB 13.5 06/12/2019 1156   HGB 13.0 05/16/2018 1219   HCT 39.8 07/05/2024 0455   HCT 39.1 05/16/2018 1219   PLT 283 07/05/2024 0455   PLT 266 06/12/2019 1156   PLT 199 05/16/2018 1219   MCV 89.8 07/05/2024 0455   MCV 91 05/16/2018 1219   MCH 29.6 07/05/2024 0455   MCHC 32.9 07/05/2024 0455   RDW 13.0 07/05/2024 0455   RDW 12.1 (L) 05/16/2018 1219   LYMPHSABS 1.6 07/04/2024 1045   LYMPHSABS 1.0 05/16/2018 1219   MONOABS 1.0 07/04/2024 1045   EOSABS 0.3 07/04/2024 1045   EOSABS 0.0 05/16/2018 1219   BASOSABS 0.1 07/04/2024 1045   BASOSABS 0.1 05/16/2018 1219    BMET    Component Value Date/Time   NA 138 07/05/2024 0455   NA 132 (L) 04/13/2024 1016   K 4.0 07/05/2024 0455   CL 102 07/05/2024 0455   CO2 27 07/05/2024 0455   GLUCOSE 105 (H) 07/05/2024 0455   BUN 6 (L) 07/05/2024 0455   BUN 11 04/13/2024 1016   CREATININE 0.77 07/05/2024 0455   CREATININE 0.86 08/12/2020 0759   CALCIUM  9.7 07/05/2024 0455   EGFR 73 04/13/2024 1016   GFRNONAA >60 07/05/2024 0455   GFRNONAA 90 08/12/2020 0759    IMAGING past 24 hours No results found.   Vitals:   07/11/24 0338 07/11/24 0719 07/11/24 1146 07/11/24 1506  BP: 114/80 116/80 128/76 128/76  Pulse: 85 63 88 76  Resp: 17 16 16 16   Temp: 98 F (36.7 C) 98.1 F (36.7 C) 98.6 F (37 C) 98.3 F (36.8 C)  TempSrc: Oral Oral Oral Oral  SpO2: 100% 100% 100% 100%  Weight:      Height:         PHYSICAL EXAM General:  Alert, well-nourished, well-developed patient in no acute distress Psych:  Mood and affect appropriate for situation CV: Regular rate and rhythm on monitor Respiratory:  Regular, unlabored respirations on room air GI:  Abdomen soft and nontender   NEURO:  Mental Status: AA&Ox3, patient is able to give clear and coherent history Speech/Language: speech is without dysarthria or aphasia.  Naming, repetition, fluency, and comprehension intact.  Cranial Nerves:  II: PERRL. Visual fields with left hemianopia  III, IV, VI: EOMI. Eyelids elevate symmetrically.  V: Sensation is intact to light touch and symmetrical to face.  VII: Face is symmetrical resting and smiling VIII: hearing intact to voice. IX, X: Palate elevates symmetrically. Phonation is normal.  KP:Dynloizm shrug 5/5. XII: tongue is midline without fasciculations. Motor: 5/5 strength to all muscle groups tested.  Trace weakness left and finger movements in the left hand.  RIGHT to left extremity.  Symmetric grip strength laterally. Tone: is normal and bulk is normal Sensation- Intact to light touch bilaterally. Extinction absent to light touch to DSS.   Coordination: FTN intact bilaterally, HKS: no ataxia in BLE.No drift. Decreased fine finger movements on left hand  Deep tendon reflexes brisker on the left compared to the right. Gait- deferred   ASSESSMENT/PLAN  Calvin Gardner is a 73 y.o. male with history of alcohol abuse, aortic atherosclerosis, arthritis, chronic hepatitis C, liver cancer, cataract,  HLD, HTN, pulmonary nodule and substance abuse who presented to the ED this morning with symptoms of intermittent headache and blurred vision for 2 weeks. During that time period, he experienced some falls and also frequently was running into things with the left side of his body.  Stroke: R MCA scattered subacute Infarcts including R optic radiation, etiology likely large vessel disease from R ICA stenosis CT head No acute abnormality.  CTA head & neck: Plaque in the right carotid bulb with less than 50% stenosis. Calcified plaque in the left carotid bulb without significant stenosis MRI: Patchy and indistinct combined acute and subacute  ischemia in the posterior Right internal capsule, adjacent deep gray nuclei and temporal lobe white matter. No hemorrhagic transformation or mass effect. 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 MRI brain w contrast no abnormal enhancement MRI w/wo repeat no significant change 2D Echo EF 55 to 60%.  Left atrial size normal LDL 56 HgbA1c 5.3 UDS unremarkable VTE prophylaxis - SCD's No antithrombotic prior to admission, now on  ASA 81 and plavix  75 DAPT. Further regimen per VVS. Therapy recommendations:  Outpt PT OT Disposition:  pending   R ICA stenosis CTA head and neck showed R ICA extensive soft plaque with stenosis approaching 50% Seems to be unstable plaque with R MCA scattered infarcts VVS Dr. Gretta consulted, planning for TCAR wednesday On DAPT now CUS unremarkable  Hypertension Home meds:  amlodipine  5mg   Stable Long term BP goal normotensive  Hyperlipidemia Home meds:  atorvastatin  20mg , resumed in hospital LDL 56, goal < 70 Continue statin at discharge  Tobacco Abuse Patient smokes 10-15 cigarettes per day for years      Ready to quit? No Nicotine  replacement therapy provided Cessation education provided  ETOH abuse  CIWA protocol  Drinking 96 to 120 ounces of beer daily  ETOH use advised to drink no more than 2 drink(s) a day Folate and thiamine  and MVI daily   Other stroke risk factors Advanced age  Other Active Problems History of hep C/liver carcinoma s/p chemo in 2019, now in remission  Hospital day # 7   Calvin Cummins, MD PhD Stroke Neurology 07/11/2024 6:44 PM   To contact Stroke Continuity provider, please refer to [email or web address]. After hours, contact General Neurology

## 2024-07-11 NOTE — Plan of Care (Signed)
   Problem: Coping: Goal: Will verbalize positive feelings about self Outcome: Progressing

## 2024-07-11 NOTE — Anesthesia Preprocedure Evaluation (Signed)
 Anesthesia Evaluation    Reviewed: Allergy & Precautions, H&P , Patient's Chart, lab work & pertinent test results  Airway        Dental   Pulmonary neg pulmonary ROS, Current Smoker          Cardiovascular Exercise Tolerance: Good hypertension, Pt. on medications negative cardio ROS      Neuro/Psych CVA negative neurological ROS  negative psych ROS   GI/Hepatic negative GI ROS, Neg liver ROS,GERD  ,,(+)     substance abuse  alcohol use, Hepatitis -  Endo/Other  negative endocrine ROS    Renal/GU negative Renal ROS  negative genitourinary   Musculoskeletal  (+) Arthritis , Osteoarthritis,    Abdominal   Peds  Hematology negative hematology ROS (+)   Anesthesia Other Findings   Reproductive/Obstetrics negative OB ROS                              Anesthesia Physical Anesthesia Plan  ASA: 3  Anesthesia Plan: General   Post-op Pain Management: Tylenol  PO (pre-op)*   Induction: Intravenous  PONV Risk Score and Plan: 2 and Ondansetron  and Dexamethasone   Airway Management Planned: Oral ETT  Additional Equipment: Arterial line  Intra-op Plan:   Post-operative Plan: Extubation in OR  Informed Consent: I have reviewed the patients History and Physical, chart, labs and discussed the procedure including the risks, benefits and alternatives for the proposed anesthesia with the patient or authorized representative who has indicated his/her understanding and acceptance.       Plan Discussed with:   Anesthesia Plan Comments:         Anesthesia Quick Evaluation

## 2024-07-11 NOTE — Progress Notes (Addendum)
  Progress Note    07/11/2024 7:15 AM * No surgery found *  Symptomatic right ICA stenosis No new symptoms overnight Scheduled for right TCAR tomorrow with Dr. Gretta in the OR Reviewed surgery and patient did not have any questions NPO after midnight Consent order placed   Teretha Damme, PA-C [facility] [phone] 07/11/2024 7:15 AM  I have seen and evaluated the patient. I agree with the PA note as documented above.  Plan right TCAR tomorrow for symptomatic right carotid stenosis.  N.p.o. midnight consent order placed.  Discussed risk and benefits including 1% stroke risk.  Continue aspirin  Plavix  statin.  Lonni DOROTHA Gretta, MD [facility] [place] Office: [phone]

## 2024-07-11 NOTE — Progress Notes (Signed)
 PROGRESS NOTE  Calvin Gardner FMW:[id number] DOB: November 24, 1950 DOA: 07/04/2024 PCP: Delbert Clam, MD  HPI/Recap of past 24 hours: Calvin Gardner is a 73 y.o. male with medical history significant of alcoholism, history of hep C, liver cancer, GERD, hyperlipidemia, hypertension, pulmonary nodule, substance abuse who presented to the emergency department with complaints of an intermittent headache associated with blurred vision, dizziness and falls with reported injuries that started about 2 weeks ago. CT head showed no acute intracranial abnormality. MRI of the brain showed patchy and indistinct combined acute and subacute ischemia in the posterior right internal capsule, adjacent deep gray nuclei and temporal lobe white matter. 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  Neurology consulted, patient admitted for further management.     Patient denies any new complaints.    Assessment/Plan: Principal Problem:   Acute cerebrovascular accident (CVA) (HCC) Active Problems:   Essential hypertension, benign   Tobacco abuse   IFG (impaired fasting glucose)   Alcohol abuse   GERD (gastroesophageal reflux disease)   Pulmonary nodule   Acute/subacute CVA MRI brain revealed acute and subacute right MCA territory strokes involving the posterior Right internal capsule, adjacent deep gray nuclei and temporal lobe white matter MRI brain with contrast, showed no abnormal intracranial enhancement suggest any lymphoma or paraneoplastic syndrome as per neurology CTA head and neck with no significant vertebrobasilar or intracranial disease (scan reviewed by Vascular/Neurology agree with over 50% R ICA plaque), carotid Doppler with similar findings. Vascular on board, plan for carotid revascularization on 12/10 Neurology/stroke team on board, discussed with Dr. Jerri, recommend at least 3 months of DTAP Vs per vascular surgery Echo showed EF of 55 to  60%, no regional wall motion abnormality, left ventricular diastolic parameters normal LDL 56, A1c 5.3 Continue aspirin , Plavix , statins PT/OT/SLP, recommend outpatient PT/OT Frequent neurochecks  Essential hypertension BP stable Holding PTA amlodipine    Tobacco abuse Tobacco cessation advised. Nicotine  replacement therapy ordered.   Alcohol abuse Drinking 96 to 120 ounces of beer daily CIWA protocol with lorazepam . Magnesium sulfate supplementation. Folate, MVI and thiamine . Consult TOC team  History of hep C/liver carcinoma Outpatient follow-up  Pulmonary nodule Follow-up with PCP for pulmonary referral as an outpatient      Estimated body mass index is 20.16 kg/m as calculated from the following:   Height as of this encounter: 5' 6 (1.676 m).   Weight as of this encounter: 56.7 kg.     Code Status: Full  Family Communication: None at bedside  Disposition Plan: Status is: Inpatient Remains inpatient appropriate because: Level of care      Consultants: Neurology Vascular Surgery  Procedures: None  Antimicrobials: None  DVT prophylaxis: Lovenox    Objective: Vitals:   07/10/24 2356 07/11/24 0338 07/11/24 0719 07/11/24 1146  BP: 95/64 114/80 116/80 128/76  Pulse: 65 85 63 88  Resp: 16 17 16 16   Temp: (!) 97.2 F (36.2 C) 98 F (36.7 C) 98.1 F (36.7 C) 98.6 F (37 C)  TempSrc: Oral Oral Oral Oral  SpO2: 100% 100% 100% 100%  Weight:      Height:       No intake or output data in the 24 hours ending 07/11/24 1337  Filed Weights   07/04/24 1621  Weight: 56.7 kg    Exam: General: NAD  Cardiovascular: S1, S2 present Respiratory: CTAB Abdomen: Soft, nontender, nondistended, bowel sounds present Musculoskeletal: No bilateral pedal edema noted Skin: Normal Psychiatry: Normal mood  Data Reviewed: CBC: Recent Labs  Lab 07/05/24 0455  WBC 7.7  HGB 13.1  HCT 39.8  MCV 89.8  PLT 283   Basic Metabolic Panel: Recent Labs   Lab 07/04/24 1937 07/05/24 0455  NA  --  138  K  --  4.0  CL  --  102  CO2  --  27  GLUCOSE  --  105*  BUN  --  6*  CREATININE  --  0.77  CALCIUM   --  9.7  MG 2.0  --   PHOS 4.6  --    GFR: Estimated Creatinine Clearance: 67.9 mL/min (by C-G formula based on SCr of 0.77 mg/dL). Liver Function Tests: Recent Labs  Lab 07/04/24 1937 07/05/24 0455  AST 26 22  ALT 14 12  ALKPHOS 41 39  BILITOT 0.5 0.7  PROT 6.4* 6.4*  ALBUMIN 3.3* 3.5   No results for input(s): LIPASE, AMYLASE in the last 168 hours. No results for input(s): AMMONIA in the last 168 hours. Coagulation Profile: No results for input(s): INR, PROTIME in the last 168 hours. Cardiac Enzymes: No results for input(s): CKTOTAL, CKMB, CKMBINDEX, TROPONINI in the last 168 hours. BNP (last 3 results) No results for input(s): PROBNP in the last 8760 hours. HbA1C: No results for input(s): HGBA1C in the last 72 hours. CBG: Recent Labs  Lab 07/08/24 1709  GLUCAP 131*   Lipid Profile: No results for input(s): CHOL, HDL, LDLCALC, TRIG, CHOLHDL, LDLDIRECT in the last 72 hours.  Thyroid Function Tests: No results for input(s): TSH, T4TOTAL, FREET4, T3FREE, THYROIDAB in the last 72 hours. Anemia Panel: No results for input(s): VITAMINB12, FOLATE, FERRITIN, TIBC, IRON, RETICCTPCT in the last 72 hours. Urine analysis:    Component Value Date/Time   COLORURINE YELLOW 07/24/2018 1555   APPEARANCEUR CLEAR 07/24/2018 1555   LABSPEC 1.013 07/24/2018 1555   PHURINE 7.0 07/24/2018 1555   GLUCOSEU 50 (A) 07/24/2018 1555   HGBUR NEGATIVE 07/24/2018 1555   BILIRUBINUR NEGATIVE 07/24/2018 1555   KETONESUR NEGATIVE 07/24/2018 1555   PROTEINUR NEGATIVE 07/24/2018 1555   NITRITE NEGATIVE 07/24/2018 1555   LEUKOCYTESUR NEGATIVE 07/24/2018 1555   Sepsis Labs: @LABRCNTIP (procalcitonin:4,lacticidven:4)  )No results found for this or any previous visit (from the past 240  hours).    Studies: No results found.    Scheduled Meds:  aspirin   81 mg Oral Daily   atorvastatin   20 mg Oral Daily   clopidogrel   75 mg Oral Daily   enoxaparin  (LOVENOX ) injection  40 mg Subcutaneous Q24H   feeding supplement  237 mL Oral BID BM   folic acid   1 mg Oral Daily   multivitamin with minerals  1 tablet Oral Daily   polyethylene glycol  17 g Oral BID   senna-docusate  1 tablet Oral BID   thiamine   100 mg Oral Daily   Or   thiamine   100 mg Intravenous Daily    Continuous Infusions:   LOS: 7 days     Lebron JINNY Cage, MD [facility]  If 7PM-7AM, please contact night-coverage [email or web address] 07/11/2024, 1:37 PM

## 2024-07-12 ENCOUNTER — Encounter (HOSPITAL_COMMUNITY): Payer: Self-pay

## 2024-07-12 ENCOUNTER — Inpatient Hospital Stay (HOSPITAL_COMMUNITY): Payer: Self-pay | Admitting: Anesthesiology

## 2024-07-12 ENCOUNTER — Encounter (HOSPITAL_COMMUNITY): Admission: EM | Disposition: A | Payer: Self-pay | Source: Home / Self Care | Attending: Internal Medicine

## 2024-07-12 ENCOUNTER — Inpatient Hospital Stay (HOSPITAL_COMMUNITY)

## 2024-07-12 DIAGNOSIS — I63231 Cerebral infarction due to unspecified occlusion or stenosis of right carotid arteries: Secondary | ICD-10-CM | POA: Diagnosis not present

## 2024-07-12 DIAGNOSIS — I7090 Unspecified atherosclerosis: Secondary | ICD-10-CM | POA: Diagnosis not present

## 2024-07-12 DIAGNOSIS — I639 Cerebral infarction, unspecified: Secondary | ICD-10-CM | POA: Diagnosis not present

## 2024-07-12 DIAGNOSIS — F17210 Nicotine dependence, cigarettes, uncomplicated: Secondary | ICD-10-CM | POA: Diagnosis not present

## 2024-07-12 HISTORY — PX: TRANSCAROTID ARTERY REVASCULARIZATIONÂ: SHX6778

## 2024-07-12 HISTORY — PX: ULTRASOUND GUIDANCE FOR VASCULAR ACCESS: SHX6516

## 2024-07-12 LAB — BASIC METABOLIC PANEL WITH GFR
Anion gap: 6 (ref 5–15)
BUN: 11 mg/dL (ref 8–23)
CO2: 28 mmol/L (ref 22–32)
Calcium: 9.6 mg/dL (ref 8.9–10.3)
Chloride: 102 mmol/L (ref 98–111)
Creatinine, Ser: 1.04 mg/dL (ref 0.61–1.24)
GFR, Estimated: >60 mL/min (ref >60)
Glucose, Bld: 105 mg/dL — ABNORMAL HIGH (ref 70–99)
Potassium: 4.4 mmol/L (ref 3.5–5.1)
Sodium: 136 mmol/L (ref 135–145)

## 2024-07-12 LAB — CBC
HCT: 40.8 % (ref 39.0–52.0)
Hemoglobin: 13.2 g/dL (ref 13.0–17.0)
MCH: 29.4 pg (ref 26.0–34.0)
MCHC: 32.4 g/dL (ref 30.0–36.0)
MCV: 90.9 fL (ref 80.0–100.0)
Platelets: 294 K/uL (ref 150–400)
RBC: 4.49 MIL/uL (ref 4.22–5.81)
RDW: 12.5 % (ref 11.5–15.5)
WBC: 8.8 K/uL (ref 4.0–10.5)
nRBC: 0 % (ref 0.0–0.2)

## 2024-07-12 LAB — POCT ACTIVATED CLOTTING TIME
Activated Clotting Time: 230 s
Activated Clotting Time: 245 s

## 2024-07-12 SURGERY — TRANSCAROTID ARTERY REVASCULARIZATION (TCAR)
Anesthesia: General | Site: Neck | Laterality: Right

## 2024-07-12 MED ORDER — DEXAMETHASONE SOD PHOSPHATE PF 10 MG/ML IJ SOLN
INTRAMUSCULAR | Status: DC | PRN
  Administered 2024-07-12: 5 mg via INTRAVENOUS

## 2024-07-12 MED ORDER — OXYCODONE HCL 5 MG PO TABS: 5.0000 mg | ORAL_TABLET | ORAL | Status: DC | PRN

## 2024-07-12 MED ORDER — ONDANSETRON HCL 4 MG/2ML IJ SOLN: 4.0000 mg | Freq: Once | INTRAMUSCULAR | Status: DC | PRN

## 2024-07-12 MED ORDER — HEPARIN SODIUM (PORCINE) 1000 UNIT/ML IJ SOLN
INTRAMUSCULAR | Status: DC | PRN
  Administered 2024-07-12: 6000 [IU] via INTRAVENOUS
  Administered 2024-07-12: 2000 [IU] via INTRAVENOUS

## 2024-07-12 MED ORDER — ACETAMINOPHEN 10 MG/ML IV SOLN
INTRAVENOUS | Status: AC
  Filled 2024-07-12: qty 100

## 2024-07-12 MED ORDER — CLEVIDIPINE BUTYRATE 0.5 MG/ML IV EMUL
INTRAVENOUS | Status: DC | PRN
  Administered 2024-07-12: 2 mg/h via INTRAVENOUS

## 2024-07-12 MED ORDER — LACTATED RINGERS IV SOLN: INTRAVENOUS | Status: DC | PRN

## 2024-07-12 MED ORDER — CEFAZOLIN SODIUM-DEXTROSE 2-3 GM-%(50ML) IV SOLR
INTRAVENOUS | Status: DC | PRN
  Administered 2024-07-12: 2 g via INTRAVENOUS

## 2024-07-12 MED ORDER — SODIUM CHLORIDE 0.9 % IV SOLN
0.0125 ug/kg/min | INTRAVENOUS | Status: AC
  Administered 2024-07-12: .1 ug/kg/min via INTRAVENOUS
  Filled 2024-07-12: qty 2000

## 2024-07-12 MED ORDER — ONDANSETRON HCL 4 MG/2ML IJ SOLN
INTRAMUSCULAR | Status: DC | PRN
  Administered 2024-07-12: 4 mg via INTRAVENOUS

## 2024-07-12 MED ORDER — SODIUM CHLORIDE 0.9 % IV SOLN: INTRAVENOUS | Status: DC | PRN

## 2024-07-12 MED ORDER — ACETAMINOPHEN 10 MG/ML IV SOLN
INTRAVENOUS | Status: DC | PRN
  Administered 2024-07-12: 1000 mg via INTRAVENOUS

## 2024-07-12 MED ORDER — LIDOCAINE 2% (20 MG/ML) 5 ML SYRINGE
INTRAMUSCULAR | Status: DC | PRN
  Administered 2024-07-12: 60 mg via INTRAVENOUS

## 2024-07-12 MED ORDER — CEFAZOLIN SODIUM-DEXTROSE 2-4 GM/100ML-% IV SOLN
INTRAVENOUS | Status: AC
  Filled 2024-07-12: qty 100

## 2024-07-12 MED ORDER — PROPOFOL 10 MG/ML IV BOLUS
INTRAVENOUS | Status: DC | PRN
  Administered 2024-07-12: 30 mg via INTRAVENOUS
  Administered 2024-07-12: 100 mg via INTRAVENOUS

## 2024-07-12 MED ORDER — CEFAZOLIN SODIUM-DEXTROSE 2-4 GM/100ML-% IV SOLN
2.0000 g | Freq: Three times a day (TID) | INTRAVENOUS | Status: AC
  Administered 2024-07-12 – 2024-07-13 (×2): 2 g via INTRAVENOUS
  Filled 2024-07-12 (×2): qty 100

## 2024-07-12 MED ORDER — HYDROMORPHONE HCL 1 MG/ML IJ SOLN: 0.2500 mg | INTRAMUSCULAR | Status: DC | PRN

## 2024-07-12 MED ORDER — GLYCOPYRROLATE PF 0.2 MG/ML IJ SOSY
PREFILLED_SYRINGE | INTRAMUSCULAR | Status: DC | PRN
  Administered 2024-07-12: .1 mg via INTRAVENOUS

## 2024-07-12 MED ORDER — FENTANYL CITRATE (PF) 250 MCG/5ML IJ SOLN
INTRAMUSCULAR | Status: DC | PRN
  Administered 2024-07-12 (×2): 50 ug via INTRAVENOUS

## 2024-07-12 MED ORDER — HEPARIN 6000 UNIT IRRIGATION SOLUTION
Status: AC
  Filled 2024-07-12: qty 500

## 2024-07-12 MED ORDER — PHENYLEPHRINE HCL-NACL 20-0.9 MG/250ML-% IV SOLN
INTRAVENOUS | Status: DC | PRN
  Administered 2024-07-12: 50 ug/min via INTRAVENOUS

## 2024-07-12 MED ORDER — ENOXAPARIN SODIUM 40 MG/0.4ML IJ SOSY
40.0000 mg | PREFILLED_SYRINGE | INTRAMUSCULAR | Status: DC
  Administered 2024-07-13 – 2024-07-15 (×3): 40 mg via SUBCUTANEOUS
  Filled 2024-07-12 (×3): qty 0.4

## 2024-07-12 MED ORDER — PROTAMINE SULFATE 10 MG/ML IV SOLN
INTRAVENOUS | Status: DC | PRN
  Administered 2024-07-12: 10 mg via INTRAVENOUS
  Administered 2024-07-12: 40 mg via INTRAVENOUS

## 2024-07-12 MED ORDER — PROPOFOL 10 MG/ML IV BOLUS
INTRAVENOUS | Status: AC
  Filled 2024-07-12: qty 20

## 2024-07-12 MED ORDER — FENTANYL CITRATE (PF) 100 MCG/2ML IJ SOLN
INTRAMUSCULAR | Status: AC
  Filled 2024-07-12: qty 2

## 2024-07-12 MED ORDER — SODIUM CHLORIDE 0.9 % IV SOLN
500.0000 mL | Freq: Once | INTRAVENOUS | Status: AC | PRN
  Administered 2024-07-12: 500 mL via INTRAVENOUS

## 2024-07-12 MED ORDER — CLEVIDIPINE BUTYRATE 0.5 MG/ML IV EMUL
INTRAVENOUS | Status: AC
  Filled 2024-07-12: qty 50

## 2024-07-12 MED ORDER — MORPHINE SULFATE (PF) 2 MG/ML IV SOLN: 2.0000 mg | INTRAVENOUS | Status: DC | PRN

## 2024-07-12 MED ORDER — CHLORHEXIDINE GLUCONATE 0.12 % MT SOLN
OROMUCOSAL | Status: AC
  Filled 2024-07-12: qty 15

## 2024-07-12 MED ORDER — 0.9 % SODIUM CHLORIDE (POUR BTL) OPTIME
TOPICAL | Status: DC | PRN
  Administered 2024-07-12: 1000 mL

## 2024-07-12 MED ORDER — ROCURONIUM BROMIDE 10 MG/ML (PF) SYRINGE
PREFILLED_SYRINGE | INTRAVENOUS | Status: DC | PRN
  Administered 2024-07-12: 50 mg via INTRAVENOUS

## 2024-07-12 MED ORDER — FENTANYL CITRATE (PF) 100 MCG/2ML IJ SOLN: 25.0000 ug | INTRAMUSCULAR | Status: DC | PRN

## 2024-07-12 MED ORDER — IODIXANOL 320 MG/ML IV SOLN
INTRAVENOUS | Status: DC | PRN
  Administered 2024-07-12: 16 mL via INTRA_ARTERIAL

## 2024-07-12 MED ORDER — HEPARIN 6000 UNIT IRRIGATION SOLUTION
Status: DC | PRN
  Administered 2024-07-12: 1

## 2024-07-12 MED ORDER — HEMOSTATIC AGENTS (NO CHARGE) OPTIME
TOPICAL | Status: DC | PRN
  Administered 2024-07-12: 1 via TOPICAL

## 2024-07-12 MED ORDER — SODIUM CHLORIDE 0.9 % IV SOLN: INTRAVENOUS | Status: AC

## 2024-07-12 MED ORDER — LACTATED RINGERS IV BOLUS
1000.0000 mL | Freq: Once | INTRAVENOUS | Status: AC
  Administered 2024-07-12: 1000 mL via INTRAVENOUS

## 2024-07-12 MED ORDER — SUGAMMADEX SODIUM 200 MG/2ML IV SOLN
INTRAVENOUS | Status: DC | PRN
  Administered 2024-07-12: 200 mg via INTRAVENOUS

## 2024-07-12 SURGICAL SUPPLY — 34 items
BAG BANDED W/RUBBER/TAPE 36X54 (MISCELLANEOUS) ×2 IMPLANT
BAG COUNTER SPONGE SURGICOUNT (BAG) ×2 IMPLANT
CANISTER SUCTION 3000ML PPV (SUCTIONS) ×2 IMPLANT
CATH BALLN ENROUTE 6X35 (CATHETERS) IMPLANT
CLIP TI MEDIUM 6 (CLIP) ×2 IMPLANT
CLIP TI WIDE RED SMALL 6 (CLIP) ×2 IMPLANT
COVER DOME SNAP 22 D (MISCELLANEOUS) ×2 IMPLANT
COVER PROBE W GEL 5X96 (DRAPES) ×2 IMPLANT
DERMABOND ADVANCED .7 DNX12 (GAUZE/BANDAGES/DRESSINGS) ×2 IMPLANT
DRAPE FEMORAL ANGIO 80X135IN (DRAPES) ×2 IMPLANT
ELECTRODE REM PT RTRN 9FT ADLT (ELECTROSURGICAL) ×2 IMPLANT
GLOVE BIO SURGEON STRL SZ7.5 (GLOVE) ×2 IMPLANT
GOWN STRL REUS W/ TWL LRG LVL3 (GOWN DISPOSABLE) ×4 IMPLANT
GOWN STRL REUS W/ TWL XL LVL3 (GOWN DISPOSABLE) ×2 IMPLANT
GUIDEWIRE ENROUTE 0.014 (WIRE) ×2 IMPLANT
HEMOSTAT SNOW SURGICEL 2X4 (HEMOSTASIS) IMPLANT
KIT BASIN OR (CUSTOM PROCEDURE TRAY) ×2 IMPLANT
KIT ENCORE 26 ADVANTAGE (KITS) ×2 IMPLANT
KIT INTRODUCER GALT 7 (INTRODUCER) ×2 IMPLANT
KIT TURNOVER KIT B (KITS) ×2 IMPLANT
PACK CAROTID (CUSTOM PROCEDURE TRAY) ×2 IMPLANT
POSITIONER HEAD DONUT 9IN (MISCELLANEOUS) ×2 IMPLANT
SET MICROPUNCTURE 5F STIFF (MISCELLANEOUS) ×2 IMPLANT
SOLN STERILE WATER BTL 1000 ML (IV SOLUTION) ×2 IMPLANT
STENT TRANSCAROTID SYS 10X40 (Permanent Stent) IMPLANT
SUT MNCRL AB 4-0 PS2 18 (SUTURE) ×2 IMPLANT
SUT PROLENE 5 0 C 1 24 (SUTURE) ×2 IMPLANT
SUT SILK 2 0 PERMA HAND 18 BK (SUTURE) ×2 IMPLANT
SUT VIC AB 3-0 SH 27X BRD (SUTURE) ×2 IMPLANT
SYR 10ML LL (SYRINGE) ×6 IMPLANT
SYR 20ML LL LF (SYRINGE) ×2 IMPLANT
SYSTEM ENROUTE TCAR NEURO PLUS (FILTER) IMPLANT
TOWEL GREEN STERILE (TOWEL DISPOSABLE) ×2 IMPLANT
WIRE BENTSON .035X145CM (WIRE) ×2 IMPLANT

## 2024-07-12 NOTE — Progress Notes (Signed)
 PT Cancellation Note  Patient Details Name: Calvin Gardner MRN: [id number] DOB: 03/27/1951   Cancelled Treatment:    Reason Eval/Treat Not Completed: Patient at procedure or test/unavailable (Pt currently at procedure for TCAR. Will follow up as able and appropriate.)  [name], DPT, CLT  Acute Rehabilitation Services Office: [phone] (Secure chat preferred)   [name] 07/12/2024, 1:19 PM

## 2024-07-12 NOTE — Progress Notes (Signed)
 Notified Dr Marcene and Dr. Perri of bp readings. Cuff reading 98/66 map 76 and art line 97/42 map 60. His HR has been fluctuating 35-40s when resting and SB. Denies any symptoms and states, I feel fine.  Yellow MEWs due to HR/BP. Continuing to monitor.

## 2024-07-12 NOTE — Progress Notes (Signed)
°   07/12/24 1800  Assess: MEWS Score  Temp 97.9 F (36.6 C)  BP (!) 77/52  MAP (mmHg) (!) 61  Pulse Rate (!) 56  ECG Heart Rate (!) 56  Resp 17  Level of Consciousness Alert  SpO2 96 %  O2 Device Room Air  Assess: MEWS Score  MEWS Temp 0  MEWS Systolic 2  MEWS Pulse 0  MEWS RR 0  MEWS LOC 0  MEWS Score 2  MEWS Score Color Yellow  Assess: if the MEWS score is Yellow or Red  Were vital signs accurate and taken at a resting state? Yes  Does the patient meet 2 or more of the SIRS criteria? No  MEWS guidelines implemented  Yes, yellow  Treat  MEWS Interventions Considered administering scheduled or prn medications/treatments as ordered  Take Vital Signs  Increase Vital Sign Frequency  Yellow: Q2hr x1, continue Q4hrs until patient remains green for 12hrs  Escalate  MEWS: Escalate Yellow: Discuss with charge nurse and consider notifying provider and/or RRT  Notify: Charge Nurse/RN  Name of Charge Nurse/RN Notified jen rn  Provider Notification  Provider Name/Title PERRI, MD  Date Provider Notified 07/12/24  Time Provider Notified [phone]  Method of Notification Page  Notification Reason Other (Comment) (low bp)  Provider response See new orders  Date of Provider Response 07/12/24  Time of Provider Response 1810  Assess: SIRS CRITERIA  SIRS Temperature  0  SIRS Respirations  0  SIRS Pulse 0  SIRS WBC 0  SIRS Score Sum  0

## 2024-07-12 NOTE — Progress Notes (Signed)
 STROKE TEAM PROGRESS NOTE   INTERIM HISTORY/SUBJECTIVE No family at the bedside. Patient lying in bed, had TCAR today, doing well, moving all extremities, plan to d/c home tomorrow.  CBC    Component Value Date/Time   WBC 8.8 07/12/2024 0252   RBC 4.49 07/12/2024 0252   HGB 13.2 07/12/2024 0252   HGB 13.5 06/12/2019 1156   HGB 13.0 05/16/2018 1219   HCT 40.8 07/12/2024 0252   HCT 39.1 05/16/2018 1219   PLT 294 07/12/2024 0252   PLT 266 06/12/2019 1156   PLT 199 05/16/2018 1219   MCV 90.9 07/12/2024 0252   MCV 91 05/16/2018 1219   MCH 29.4 07/12/2024 0252   MCHC 32.4 07/12/2024 0252   RDW 12.5 07/12/2024 0252   RDW 12.1 (L) 05/16/2018 1219   LYMPHSABS 1.6 07/04/2024 1045   LYMPHSABS 1.0 05/16/2018 1219   MONOABS 1.0 07/04/2024 1045   EOSABS 0.3 07/04/2024 1045   EOSABS 0.0 05/16/2018 1219   BASOSABS 0.1 07/04/2024 1045   BASOSABS 0.1 05/16/2018 1219    BMET    Component Value Date/Time   NA 136 07/12/2024 0252   NA 132 (L) 04/13/2024 1016   K 4.4 07/12/2024 0252   CL 102 07/12/2024 0252   CO2 28 07/12/2024 0252   GLUCOSE 105 (H) 07/12/2024 0252   BUN 11 07/12/2024 0252   BUN 11 04/13/2024 1016   CREATININE 1.04 07/12/2024 0252   CREATININE 0.86 08/12/2020 0759   CALCIUM  9.6 07/12/2024 0252   EGFR 73 04/13/2024 1016   GFRNONAA >60 07/12/2024 0252   GFRNONAA 90 08/12/2020 0759    IMAGING past 24 hours HYBRID OR IMAGING ([facility] ONLY) Result Date: 07/12/2024 There is no interpretation for this exam.  This order is for images obtained during a surgical procedure.  Please See Surgeries Tab for more information regarding the procedure.     Vitals:   07/12/24 1359 07/12/24 1400 07/12/24 1500 07/12/24 1600  BP: 116/70 (!) 121/108 101/62 (!) 90/44  Pulse: (!) 58 (!) 56 (!) 56 60  Resp: 18 19 16 15   Temp: 98.3 F (36.8 C)  (!) 97.5 F (36.4 C)   TempSrc: Oral  Oral   SpO2: 98% 98% 100% 99%  Weight:      Height:         PHYSICAL EXAM General:  Alert,  well-nourished, well-developed patient in no acute distress Psych:  Mood and affect appropriate for situation CV: Regular rate and rhythm on monitor Respiratory:  Regular, unlabored respirations on room air GI: Abdomen soft and nontender   NEURO:  Mental Status: AA&Ox3, patient is able to give clear and coherent history Speech/Language: speech is without dysarthria or aphasia.  Naming, repetition, fluency, and comprehension intact.  Cranial Nerves:  II: PERRL. Visual fields with left hemianopia  III, IV, VI: EOMI. Eyelids elevate symmetrically.  V: Sensation is intact to light touch and symmetrical to face.  VII: Face is symmetrical resting and smiling VIII: hearing intact to voice. IX, X: Palate elevates symmetrically. Phonation is normal.  KP:Dynloizm shrug 5/5. XII: tongue is midline without fasciculations. Motor: 5/5 strength to all muscle groups tested.  Trace weakness left and finger movements in the left hand.  RIGHT to left extremity.  Symmetric grip strength laterally. Tone: is normal and bulk is normal Sensation- Intact to light touch bilaterally. Extinction absent to light touch to DSS.   Coordination: FTN intact bilaterally, HKS: no ataxia in BLE.No drift. Decreased fine finger movements on left hand  Deep tendon  reflexes brisker on the left compared to the right. Gait- deferred   ASSESSMENT/PLAN  Calvin Gardner is a 73 y.o. male with history of alcohol abuse, aortic atherosclerosis, arthritis, chronic hepatitis C, liver cancer, cataract, HLD, HTN, pulmonary nodule and substance abuse who presented to the ED this morning with symptoms of intermittent headache and blurred vision for 2 weeks. During that time period, he experienced some falls and also frequently was running into things with the left side of his body.  Stroke: R MCA scattered subacute Infarcts including R optic radiation, etiology likely large vessel disease from R ICA stenosis CT head No acute  abnormality.  CTA head & neck: Plaque in the right carotid bulb with less than 50% stenosis. Calcified plaque in the left carotid bulb without significant stenosis MRI: Patchy and indistinct combined acute and subacute ischemia in the posterior Right internal capsule, adjacent deep gray nuclei and temporal lobe white matter. No hemorrhagic transformation or mass effect. 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 MRI brain w contrast no abnormal enhancement MRI w/wo repeat no significant change 2D Echo EF 55 to 60%.  Left atrial size normal LDL 56 HgbA1c 5.3 UDS unremarkable VTE prophylaxis - SCD's No antithrombotic prior to admission, now on  ASA 81 and plavix  75 DAPT. Final regimen per [facility]VS. Therapy recommendations:  Outpt PT OT Disposition:  pending   R ICA stenosis CTA head and neck showed R ICA extensive soft plaque with stenosis approaching 50% Seems to be unstable plaque with R MCA scattered infarcts s/p TCAR today with Dr. Gretta On DAPT now CUS unremarkable  Hypertension Home meds:  amlodipine  5mg   Stable Long term BP goal normotensive  Hyperlipidemia Home meds:  atorvastatin  20mg , resumed in hospital LDL 56, goal < 70 Continue statin at discharge  Tobacco Abuse Patient smokes 10-15 cigarettes per day for years      Ready to quit? No Nicotine  replacement therapy provided Cessation education provided  ETOH abuse  CIWA protocol  Drinking 96 to 120 ounces of beer daily  ETOH use advised to drink no more than 2 drink(s) a day Folate and thiamine  and MVI daily   Other stroke risk factors Advanced age  Other Active Problems History of hep C/liver carcinoma s/p chemo in 2019, now in remission  Hospital day # 8  Neurology will sign off. Please call with questions. Pt will follow up with stroke  clinic NP at [facility] in about 4 weeks. Thanks for the consult.   Ary Cummins, MD PhD Stroke Neurology 07/12/2024 5:07 PM   To contact Stroke Continuity provider, please refer to [email or web address]. After hours, contact General Neurology

## 2024-07-12 NOTE — Anesthesia Procedure Notes (Addendum)
 Arterial Line Insertion Start/End12/05/2024 9:25 AM, 07/12/2024 9:30 AM Performed by: Zelphia Norleen HERO, CRNA, CRNA  Patient location: Pre-op. Preanesthetic checklist: patient identified, IV checked, site marked, risks and benefits discussed, surgical consent, monitors and equipment checked, pre-op evaluation, timeout performed and anesthesia consent Lidocaine  1% used for infiltration Left, radial was placed Catheter size: 20 G Hand hygiene performed  and maximum sterile barriers used   Attempts: 1 Procedure performed using ultrasound to evaluate access site. Ultrasound Notes:image(s) printed for medical record. Following insertion, dressing applied. Post procedure assessment: normal and unchanged  Patient tolerated the procedure well with no immediate complications.

## 2024-07-12 NOTE — Transfer of Care (Signed)
 Immediate Anesthesia Transfer of Care Note  Patient: Calvin Gardner  Procedure(s) Performed: RIGHT TRANSCAROTID ARTERY REVASCULARIZATION (Right: Neck) ULTRASOUND GUIDANCE, FOR VASCULAR ACCESS (Left: Groin)  Patient Location: PACU  Anesthesia Type:General  Level of Consciousness: awake  Airway & Oxygen Therapy: Patient Spontanous Breathing  Post-op Assessment: Report given to RN and Post -op Vital signs reviewed and stable  Post vital signs: Reviewed and stable  Last Vitals:  Vitals Value Taken Time  BP 105/63 07/12/24 12:32  Temp    Pulse 60 07/12/24 12:40  Resp 12 07/12/24 12:40  SpO2 94 % 07/12/24 12:40  Vitals shown include unfiled device data.  Last Pain:  Vitals:   07/12/24 0913  TempSrc: Oral  PainSc: 0-No pain      Patients Stated Pain Goal: 0 (07/07/24 2325)  Complications: No notable events documented.

## 2024-07-12 NOTE — Plan of Care (Signed)

## 2024-07-12 NOTE — Anesthesia Postprocedure Evaluation (Signed)
 Anesthesia Post Note  Patient: MENDY LAPINSKY  Procedure(s) Performed: RIGHT TRANSCAROTID ARTERY REVASCULARIZATION (Right: Neck) ULTRASOUND GUIDANCE, FOR VASCULAR ACCESS (Left: Groin)     Patient location during evaluation: Nursing Unit Anesthesia Type: General Level of consciousness: awake and alert Pain management: pain level controlled Vital Signs Assessment: post-procedure vital signs reviewed and stable Respiratory status: spontaneous breathing, nonlabored ventilation and respiratory function stable Cardiovascular status: blood pressure returned to baseline and stable Postop Assessment: no apparent nausea or vomiting Anesthetic complications: no   No notable events documented.  Last Vitals:  Vitals:   07/12/24 1500 07/12/24 1600  BP: 101/62 (!) 90/44  Pulse: (!) 56 60  Resp: 16 15  Temp: (!) 36.4 C   SpO2: 100% 99%    Last Pain:  Vitals:   07/12/24 1500  TempSrc: Oral  PainSc:                  Garnette FORBES Skillern

## 2024-07-12 NOTE — Progress Notes (Signed)
 [facility] [place]  Subjective  -no acute events   Objective 138/73 73 98.7 F (37.1 C) (Oral) 16 96% No intake or output data in the 24 hours ending 07/12/24 1019  Grossly neurologically intact  Laboratory Lab Results: Recent Labs    07/12/24 0252  WBC 8.8  HGB 13.2  HCT 40.8  PLT 294   BMET Recent Labs    07/12/24 0252  NA 136  K 4.4  CL 102  CO2 28  GLUCOSE 105*  BUN 11  CREATININE 1.04  CALCIUM  9.6    COAG Lab Results  Component Value Date   INR 1.0 08/12/2020   INR 0.9 01/15/2020   INR 1.0 07/12/2019   No results found for: PTT  Assessment/Planning:  Plan right TCAR for symptomatic right carotid stenosis over 50%.  Discussed risk and benefits including 1% stroke risk.  Continue aspirin  Plavix  statin.  Calvin Gardner 07/12/2024 10:19 AM --

## 2024-07-12 NOTE — Progress Notes (Signed)
 Pt admitted to room PACU. Initiated tele. Oriented pt to the unit. Vss. Call bell within reach.   Amado GORMAN Arabia, RN

## 2024-07-12 NOTE — Anesthesia Procedure Notes (Signed)
 Procedure Name: Intubation Date/Time: 07/12/2024 10:51 AM  Performed by: Virgil Ee, CRNAPre-anesthesia Checklist: Patient identified, Patient being monitored, Timeout performed, Emergency Drugs available and Suction available Patient Re-evaluated:Patient Re-evaluated prior to induction Oxygen Delivery Method: Circle system utilized Preoxygenation: Pre-oxygenation with 100% oxygen Induction Type: IV induction Ventilation: Mask ventilation without difficulty Laryngoscope Size: Mac and 4 Grade View: Grade I Tube type: Oral Tube size: 7.0 mm Number of attempts: 1 Airway Equipment and Method: Stylet Placement Confirmation: ETT inserted through vocal cords under direct vision, positive ETCO2 and breath sounds checked- equal and bilateral Secured at: 23 cm Tube secured with: Tape Dental Injury: Teeth and Oropharynx as per pre-operative assessment

## 2024-07-12 NOTE — Progress Notes (Signed)
 PROGRESS NOTE    Calvin Gardner  FMW:[id number] DOB: 16-Aug-1950 DOA: 07/04/2024 PCP: Delbert Clam, MD  Chief Complaint  Patient presents with   Headache    Brief Narrative:   Calvin Gardner is Calvin Gardner 73 y.o. male with medical history significant of alcoholism, history of hep C, liver cancer, GERD, hyperlipidemia, hypertension, pulmonary nodule, substance abuse who presented to the emergency department with complaints of an intermittent headache associated with blurred vision, dizziness and falls with reported injuries that started about 2 weeks ago. CT head showed no acute intracranial abnormality. MRI of the brain showed patchy and indistinct combined acute and subacute ischemia in the posterior right internal capsule, adjacent deep gray nuclei and temporal lobe white matter. 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  Neurology consulted, patient admitted for further management.  He's now s/p TCAR by vascular.    Assessment & Plan:   Principal Problem:   Acute cerebrovascular accident (CVA) (HCC) Active Problems:   Essential hypertension, benign   Tobacco abuse   IFG (impaired fasting glucose)   Alcohol abuse   GERD (gastroesophageal reflux disease)   Pulmonary nodule  Acute/subacute CVA MRI brain revealed acute and subacute ischemia in the posterior right internal capsule, adjacent deep gray nuclei and temporal lobe white matter.  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  CTA head/neck with plaque in R carotid bulb, calcified plaque in L carotid bulb MRI 12/6 with expected evolution of subacute nonhemorrhagic infarcts in the posterior lumb right internal capsule and right temporal lobe with extensive T2 and flair hyperintensity in the R temporal lobe without associated enhancement - no new  or progressive infarct - extensive T2 hyperintensities in the central pons Vascular on board, now s/p TCAR 12/10 - noted post TCAR bradycardia, will monitor, he's asymptomatic Neurology/stroke team on board, discussed with Dr. Jerri, recommend at least 3 months of DAPT (Vs per vascular surgery recs) Echo showed EF of 55 to 60%, no regional wall motion abnormality, left ventricular diastolic parameters normal LDL 56, A1c 5.3 Continue aspirin , Plavix , statins PT/OT/SLP, recommend outpatient PT/OT Frequent neurochecks   Essential hypertension BP stable Holding PTA amlodipine    Alcohol abuse Drinking 96 to 120 ounces of beer daily Outside of withdrawal window   Tobacco abuse Tobacco cessation advised. Nicotine  replacement therapy ordered.   History of hep C/liver carcinoma Outpatient follow-up   Pulmonary nodule Follow-up with PCP for pulmonary referral as an outpatient   Body mass index is 20.16 kg/m.    DVT prophylaxis: lovenox  Code Status: full Family Communication: none Disposition:   Status is: Inpatient Remains inpatient appropriate because: continued need for inpatient care post op   Consultants:  Vascular neurology  Procedures:   12/10 Procedure: 1.  Ultrasound-guided access left common femoral vein for delivery of TCAR venous sheath 2.  Transcatheter placement of right cervical carotid artery stent including angioplasty with flow reversal for distal embolic protection (right TCAR)  Echo IMPRESSIONS     1. Left ventricular ejection fraction, by estimation, is 55 to 60%. The  left ventricle has normal function. The left ventricle has no regional  wall motion abnormalities. Left ventricular diastolic parameters were  normal.   2. Right ventricular systolic function is normal. The right ventricular  size is normal. There is normal pulmonary artery systolic pressure. The  estimated right  ventricular systolic pressure is 30.1 mmHg.   3. The mitral valve is  grossly normal. Trivial mitral valve  regurgitation. No evidence of mitral stenosis.   4. The aortic valve is tricuspid. Aortic valve regurgitation is not  visualized. No aortic stenosis is present.   5. The inferior vena cava is dilated in size with >50% respiratory  variability, suggesting right atrial pressure of 8 mmHg.   Conclusion(s)/Recommendation(s): No intracardiac source of embolism  detected on this transthoracic study. Consider Calvin Gardner transesophageal  echocardiogram to exclude cardiac source of embolism if clinically  indicated.   Carotid US  final report pending  Antimicrobials:  Anti-infectives (From admission, onward)    Start     Dose/Rate Route Frequency Ordered Stop   07/12/24 1930  ceFAZolin  (ANCEF ) IVPB 2g/100 mL premix        2 g 200 mL/hr over 30 Minutes Intravenous Every 8 hours 07/12/24 1341 07/13/24 1129   07/12/24 1029  ceFAZolin  (ANCEF ) 2-4 GM/100ML-% IVPB       Note to Pharmacy: Calvin Gardner: cabinet override      07/12/24 1029 07/12/24 2244       Subjective: No complaints  Objective: Vitals:   07/12/24 1330 07/12/24 1359 07/12/24 1400 07/12/24 1500  BP: 121/84 116/70 (!) 121/108 101/62  Pulse: (!) 57 (!) 58 (!) 56 (!) 56  Resp: 13 18 19 16   Temp: 97.7 F (36.5 C) 98.3 F (36.8 C)  (!) 97.5 F (36.4 C)  TempSrc:  Oral  Oral  SpO2: 96% 98% 98% 100%  Weight:      Height:        Intake/Output Summary (Last 24 hours) at 07/12/2024 1531 Last data filed at 07/12/2024 1239 Gross per 24 hour  Intake 1050 ml  Output 50 ml  Net 1000 ml   Filed Weights   07/04/24 1621  Weight: 56.7 kg    Examination:  General exam: Appears calm and comfortable  Respiratory system: unlabored Cardiovascular system: RRR - sinus brady - surgical incision to base of R neck Gastrointestinal system: Abdomen is nondistended, soft and nontender. Central nervous system: Alert and oriented. Moving all extremities. Extremities: no LEE   Data Reviewed: I have  personally reviewed following labs and imaging studies  CBC: Recent Labs  Lab 07/12/24 0252  WBC 8.8  HGB 13.2  HCT 40.8  MCV 90.9  PLT 294    Basic Metabolic Panel: Recent Labs  Lab 07/12/24 0252  NA 136  K 4.4  CL 102  CO2 28  GLUCOSE 105*  BUN 11  CREATININE 1.04  CALCIUM  9.6    GFR: Estimated Creatinine Clearance: 52.2 mL/min (by C-G formula based on SCr of 1.04 mg/dL).  Liver Function Tests: No results for input(s): AST, ALT, ALKPHOS, BILITOT, PROT, ALBUMIN in the last 168 hours.  CBG: Recent Labs  Lab 07/08/24 1709  GLUCAP 131*     Recent Results (from the past 240 hours)  Surgical pcr screen     Status: Abnormal   Collection Time: 07/11/24  4:24 PM   Specimen: Nasal Mucosa; Nasal Swab  Result Value Ref Range Status   MRSA, PCR POSITIVE (Tracie Lindbloom) NEGATIVE Final    Comment: RESULT CALLED TO, READ BACK BY AND VERIFIED WITH: RN ALONSO ROTUNDA [phone] @ [phone] FH    Staphylococcus aureus POSITIVE (Kelii Chittum) NEGATIVE Final    Comment: (NOTE) The Xpert SA Assay (FDA approved for NASAL specimens in patients 2 years of age and older), is one component of [employer] comprehensive surveillance program. It  is not intended to diagnose infection nor to guide or monitor treatment. Performed at [facility], [address] [place], [place] [address]          Radiology Studies: HYBRID OR IMAGING [facility] ONLY) Result Date: 07/12/2024 There is no interpretation for this exam.  This order is for images obtained during Enora Trillo surgical procedure.  Please See Surgeries Tab for more information regarding the procedure.        Scheduled Meds:  aspirin   81 mg Oral Daily   atorvastatin   20 mg Oral Daily   chlorhexidine        Chlorhexidine  Gluconate Cloth  6 each Topical Daily   clopidogrel   75 mg Oral Daily   [START ON 07/13/2024] enoxaparin  (LOVENOX ) injection  40 mg Subcutaneous Q24H   feeding supplement  237 mL Oral BID BM   folic acid   1 mg Oral Daily    multivitamin with minerals  1 tablet Oral Daily   mupirocin  ointment  1 Application Nasal BID   polyethylene glycol  17 g Oral BID   senna-docusate  1 tablet Oral BID   thiamine   100 mg Oral Daily   Or   thiamine   100 mg Intravenous Daily   Continuous Infusions:  sodium chloride      sodium chloride  75 mL/hr at 07/12/24 1420   ceFAZolin       ceFAZolin  (ANCEF ) IV       LOS: 8 days    Time spent: over 30 min     Meliton Monte, MD [facility]   To contact the attending provider between 7A-7P or the covering provider during after hours 7P-7A, please log into the web site [email or web address] and access using universal [place] password for that web site. If you do not have the password, please call the hospital operator.  07/12/2024, 3:31 PM

## 2024-07-12 NOTE — Op Note (Signed)
 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  Ultrasound-guided access left common femoral vein for delivery of TCAR venous sheath 2.  Transcatheter placement of right cervical carotid artery stent including angioplasty with flow reversal for distal embolic protection (right TCAR)  Surgeon: Dr. Lonni DOROTHA Gaskins, MD  Assistant: Ahmed Holster, PA  Indications: 73 year old male admitted with lower extremity weakness more pronounced in the left leg.  MRI brain showed subacute infarcts in the right internal capsule and temporal lobe.  Neurology consulted vascular and felt that his right carotid bulb plaque including the internal carotid artery was greater than 50% and the cause for his stroke.  He presents today for right TCAR after risks benefits discussed.  Assistant was needed given the complexity of the case and also for stenting and cutdown on the carotid artery.  Findings: Ultrasound-guided access left common femoral vein for delivery of TCAR venous sheath.  Transverse incision above the right clavicle with exposure of the common carotid artery by dissecting through the heads of the sternocleidomastoid.  After we got access in the right common carotid artery we connected the filter to the groin for passive flow reversal.  The lesion was then crossed once we went on active clamp with active flow reversal for distal embolic protection.  The  right internal carotid lesion was treated with a 6 mm x 35 mm angioplasty balloon and then stented with a 10 mm x 40 mm Enroute stent.  Widely patent stent at completion.  Anesthesia: General  Details: Patient was taken to the operating room after informed consent was obtained.  Placed on the operative table in supine position.  General endotracheal anesthesia was induced.  The right neck and bilateral groins were  prepped and draped in standard sterile fashion.  Antibiotics were given and timeout performed.  Initially used ultrasound to evaluate the left common femoral vein, it was patent, an image was saved.  This was accessed with a micro access needle placed a microwire and a micro sheath.  I then used a Bentson wire and upsized to the TCAR venous sheath in the left groin and flushed and aspirated easily.  Then turned our attention to the right neck where a transverse incision was made one fingerbreadth above the clavicle.  Dissected through the platysma with Bovie cautery.  We divided between the heads of the sternocleidomastoid and dissected the internal jugular vein laterally.  We then exposed the common carotid artery and this was controlled with a large umbilical tape and vessel loop.  The vagus nerve was protected.  I then placed a pursestring on the anterior wall the common carotid with 5-0 Prolene.  The patient was given 100 units/kg IV heparin .  ACT was checked and maintained greater than 250.  Additional heparin  was given during the case.  We then accessed the pursestring in the common carotid artery placed a micro access needle, microwire, micro sheath, and then got a right carotid angiogram to identify the carotid bifurcation and then used a J-wire to upsized to the arterial working sheath.  I then connected the filter to the venous sheath in the groin and we were on passive flow reversal.  We did a TCAR tip timeout and then changed the angled C arm until we can get a good image of the bifurcation to identify the over 50% stenosis in the internal carotid artery.  I then clamped  common carotid artery and went on active flow reversal.  The lesion was crossed with a wire and this was treated with a 6 mm x 35 mm angioplasty balloon and then stented with a 10 mm x 40 mm Enroute stent with the help of my assistant.  Total flow reversal time was 6 minutes.  We allowed 2 more minutes of active flow reversal.  Widely  patent stent at completion.  Wires were removed and came off clamp.  We removed the arterial sheath in the common carotid artery and tied down the pursestring with good hemostasis.  Listened with pencil Doppler had good flow.  Protamine was given for reversal.  I then irrigated at the neck and used Surgicel snow.  The neck was closed with 3-0 Vicryl 4-0 Monocryl and Dermabond.  We removed the venous sheath and held pressure with good hemostasis.  Awakened at neurologic baseline.  Complication: None  Condition: Stable  Lonni DOROTHA Gaskins, MD [facility] Office: [phone]   Lonni JINNY Gaskins

## 2024-07-13 ENCOUNTER — Encounter (HOSPITAL_COMMUNITY): Payer: Self-pay | Admitting: Vascular Surgery

## 2024-07-13 DIAGNOSIS — Z48812 Encounter for surgical aftercare following surgery on the circulatory system: Secondary | ICD-10-CM

## 2024-07-13 DIAGNOSIS — R911 Solitary pulmonary nodule: Secondary | ICD-10-CM

## 2024-07-13 DIAGNOSIS — Z720 Tobacco use: Secondary | ICD-10-CM

## 2024-07-13 LAB — COMPREHENSIVE METABOLIC PANEL WITH GFR
ALT: 15 U/L (ref 0–44)
AST: 27 U/L (ref 15–41)
Albumin: 3.1 g/dL — ABNORMAL LOW (ref 3.5–5.0)
Alkaline Phosphatase: 41 U/L (ref 38–126)
Anion gap: 5 (ref 5–15)
BUN: 9 mg/dL (ref 8–23)
CO2: 22 mmol/L (ref 22–32)
Calcium: 8.4 mg/dL — ABNORMAL LOW (ref 8.9–10.3)
Chloride: 106 mmol/L (ref 98–111)
Creatinine, Ser: 0.62 mg/dL (ref 0.61–1.24)
GFR, Estimated: >60 mL/min (ref >60)
Glucose, Bld: 121 mg/dL — ABNORMAL HIGH (ref 70–99)
Potassium: 4.1 mmol/L (ref 3.5–5.1)
Sodium: 133 mmol/L — ABNORMAL LOW (ref 135–145)
Total Bilirubin: 0.6 mg/dL (ref 0.0–1.2)
Total Protein: 6.2 g/dL — ABNORMAL LOW (ref 6.5–8.1)

## 2024-07-13 LAB — CBC
HCT: 34.5 % — ABNORMAL LOW (ref 39.0–52.0)
Hemoglobin: 11.4 g/dL — ABNORMAL LOW (ref 13.0–17.0)
MCH: 30.6 pg (ref 26.0–34.0)
MCHC: 33 g/dL (ref 30.0–36.0)
MCV: 92.5 fL (ref 80.0–100.0)
Platelets: 241 K/uL (ref 150–400)
RBC: 3.73 MIL/uL — ABNORMAL LOW (ref 4.22–5.81)
RDW: 12.3 % (ref 11.5–15.5)
WBC: 9.7 K/uL (ref 4.0–10.5)
nRBC: 0 % (ref 0.0–0.2)

## 2024-07-13 LAB — MAGNESIUM: Magnesium: 2 mg/dL (ref 1.7–2.4)

## 2024-07-13 LAB — PHOSPHORUS: Phosphorus: 2.8 mg/dL (ref 2.5–4.6)

## 2024-07-13 MED ORDER — LORAZEPAM 1 MG PO TABS
1.0000 mg | ORAL_TABLET | ORAL | Status: DC | PRN
  Administered 2024-07-13: 1 mg via ORAL
  Filled 2024-07-13: qty 1

## 2024-07-13 MED ORDER — LORAZEPAM 2 MG/ML IJ SOLN
1.0000 mg | INTRAMUSCULAR | Status: DC | PRN
  Administered 2024-07-13: 4 mg via INTRAVENOUS
  Administered 2024-07-13 – 2024-07-14 (×3): 2 mg via INTRAVENOUS
  Filled 2024-07-13 (×2): qty 1
  Filled 2024-07-13: qty 2
  Filled 2024-07-13: qty 1

## 2024-07-13 MED ORDER — LACTATED RINGERS IV SOLN: INTRAVENOUS | Status: AC

## 2024-07-13 NOTE — Progress Notes (Signed)
 Physical Therapy Treatment Patient Details Name: Calvin Gardner MRN: [id number] DOB: 23-Nov-1950 Today's Date: 07/13/2024   History of Present Illness 73 yo male presented 12/2 HA, blurred vision, dizziness and falls CT (+) acute territorial infarct, MRI (+) acute subacute ischemia in posterior R internal capsule, temporal lobe, small subacute infarct in R MCA motor strip PMH ETOH abuse, aortic atherosclerosis, OA, Hep C, liver CA, cataract,HLD, HTN, Pulmonary nodule, substance abuse, smoker    PT Comments  Pt resting in bed on arrival, eager for mobility and agreeable to session. Pt with some impulsivity with mobility, moving quickly with poor attention to line/leads. Pt requesting RW use for mobility this session and demonstrating transfers and gait with up to min A to steady as pt with poor attention to L environment, often running into objects. Pt with poor insight into deficits often stating that the object (chair etc) was in the way versus pt making anticipatory or reactive corrections to gait path. Pt was educated on continued walker use to maximize functional independence, safety, and decrease risk for falls. Pt continues to benefit from skilled PT services to progress toward functional mobility goals.     If plan is discharge home, recommend the following: Assist for transportation;Help with stairs or ramp for entrance   Can travel by private vehicle        Equipment Recommendations  Rolling walker (2 wheels)    Recommendations for Other Services       Precautions / Restrictions Precautions Precautions: Fall Recall of Precautions/Restrictions: Intact Precaution/Restrictions Comments: SBP <180 Restrictions Weight Bearing Restrictions Per Provider Order: No     Mobility  Bed Mobility Overal bed mobility: Independent             General bed mobility comments: able to get to EOB without assistance    Transfers Overall transfer level: Modified independent Equipment  used: None               General transfer comment: able to perform transfers without physical assist    Ambulation/Gait Ambulation/Gait assistance: Contact guard assist Gait Distance (Feet): 450 Feet Assistive device: Rolling walker (2 wheels) Gait Pattern/deviations: Step-through pattern, Decreased stride length Gait velocity: decreased     General Gait Details: pt needing increased cues to attend to L einvironement, poor anticapatory correction with pt bumoing objects on L, cue to stay in the RW with turning   Stairs             Wheelchair Mobility     Tilt Bed    Modified Rankin (Stroke Patients Only) Modified Rankin (Stroke Patients Only) Pre-Morbid Rankin Score: No symptoms Modified Rankin: Slight disability     Balance Overall balance assessment: Needs assistance Sitting-balance support: No upper extremity supported, Feet supported Sitting balance-Leahy Scale: Good Sitting balance - Comments: sitting EOB   Standing balance support: No upper extremity supported, Single extremity supported, During functional activity Standing balance-Leahy Scale: Fair Standing balance comment: stood at sink and for LB self care with no LOB                            Communication Communication Communication: No apparent difficulties  Cognition Arousal: Alert Behavior During Therapy: WFL for tasks assessed/performed, Impulsive   PT - Cognitive impairments: No family/caregiver present to determine baseline, Awareness, Attention, Sequencing, Safety/Judgement                       PT -  Cognition Comments: impulsive with moving quickly and decreased attention to task Following commands: Impaired Following commands impaired: Follows multi-step commands inconsistently, Follows one step commands inconsistently, Follows one step commands with increased time    Cueing Cueing Techniques: Verbal cues, Tactile cues, Visual cues  Exercises      General  Comments General comments (skin integrity, edema, etc.): VSS on RA      Pertinent Vitals/Pain Pain Assessment Pain Assessment: No/denies pain    Home Living                          Prior Function            PT Goals (current goals can now be found in the care plan section) Acute Rehab PT Goals Patient Stated Goal: to work on balance PT Goal Formulation: With patient Time For Goal Achievement: 07/19/24 Progress towards PT goals: Progressing toward goals    Frequency    Min 2X/week      PT Plan      Co-evaluation              AM-PAC PT 6 Clicks Mobility   Outcome Measure  Help needed turning from your back to your side while in a flat bed without using bedrails?: None Help needed moving from lying on your back to sitting on the side of a flat bed without using bedrails?: None Help needed moving to and from a bed to a chair (including a wheelchair)?: None Help needed standing up from a chair using your arms (e.g., wheelchair or bedside chair)?: None Help needed to walk in hospital room?: A Little Help needed climbing 3-5 steps with a railing? : A Little 6 Click Score: 22    End of Session Equipment Utilized During Treatment: Gait belt Activity Tolerance: Patient tolerated treatment well Patient left: in bed;with call bell/phone within reach;with bed alarm set Nurse Communication: Mobility status PT Visit Diagnosis: Unsteadiness on feet (R26.81);Other abnormalities of gait and mobility (R26.89)     Time: 9071-9049 PT Time Calculation (min) (ACUTE ONLY): 22 min  Charges:    $Gait Training: 8-22 mins PT General Charges $$ ACUTE PT VISIT: 1 Visit                     Mei Suits R. PTA Acute Rehabilitation Services Office: [phone]   Therisa CHRISTELLA Boor 07/13/2024, 9:53 AM

## 2024-07-13 NOTE — Plan of Care (Signed)
°  Problem: Education: Goal: Knowledge of disease or condition will improve Outcome: Progressing Goal: Knowledge of secondary prevention will improve (MUST DOCUMENT ALL) Outcome: Progressing Goal: Knowledge of patient specific risk factors will improve (DELETE if not current risk factor) Outcome: Progressing   Problem: Ischemic Stroke/TIA Tissue Perfusion: Goal: Complications of ischemic stroke/TIA will be minimized Outcome: Progressing   Problem: Coping: Goal: Will verbalize positive feelings about self Outcome: Progressing Goal: Will identify appropriate support needs Outcome: Progressing   Problem: Health Behavior/Discharge Planning: Goal: Ability to manage health-related needs will improve Outcome: Progressing Goal: Goals will be collaboratively established with patient/family Outcome: Progressing   Problem: Self-Care: Goal: Ability to participate in self-care as condition permits will improve Outcome: Progressing Goal: Verbalization of feelings and concerns over difficulty with self-care will improve Outcome: Progressing Goal: Ability to communicate needs accurately will improve Outcome: Progressing   Problem: Nutrition: Goal: Risk of aspiration will decrease Outcome: Progressing Goal: Dietary intake will improve Outcome: Progressing   Problem: Education: Goal: Knowledge of General Education information will improve Description: Including pain rating scale, medication(s)/side effects and non-pharmacologic comfort measures Outcome: Progressing   Problem: Health Behavior/Discharge Planning: Goal: Ability to manage health-related needs will improve Outcome: Progressing   Problem: Clinical Measurements: Goal: Ability to maintain clinical measurements within normal limits will improve Outcome: Progressing Goal: Will remain free from infection Outcome: Progressing Goal: Diagnostic test results will improve Outcome: Progressing Goal: Respiratory complications will  improve Outcome: Progressing Goal: Cardiovascular complication will be avoided Outcome: Progressing   Problem: Activity: Goal: Risk for activity intolerance will decrease Outcome: Progressing   Problem: Nutrition: Goal: Adequate nutrition will be maintained Outcome: Progressing   Problem: Coping: Goal: Level of anxiety will decrease Outcome: Progressing   Problem: Elimination: Goal: Will not experience complications related to bowel motility Outcome: Progressing Goal: Will not experience complications related to urinary retention Outcome: Progressing   Problem: Pain Managment: Goal: General experience of comfort will improve and/or be controlled Outcome: Progressing   Problem: Safety: Goal: Ability to remain free from injury will improve Outcome: Progressing   Problem: Skin Integrity: Goal: Risk for impaired skin integrity will decrease Outcome: Progressing   Problem: Education: Goal: Knowledge of discharge needs will improve Outcome: Progressing   Problem: Clinical Measurements: Goal: Postoperative complications will be avoided or minimized Outcome: Progressing   Problem: Respiratory: Goal: Will achieve and/or maintain a regular respiratory rate, without signs or symptoms of dyspnea Outcome: Progressing   Problem: Skin Integrity: Goal: Demonstration of wound healing without infection will improve Outcome: Progressing   Problem: Safety: Goal: Non-violent Restraint(s) Outcome: Progressing

## 2024-07-13 NOTE — Plan of Care (Signed)
°  Problem: Education: Goal: Knowledge of disease or condition will improve Outcome: Progressing Goal: Knowledge of secondary prevention will improve (MUST DOCUMENT ALL) Outcome: Progressing   Problem: Education: Goal: Knowledge of secondary prevention will improve (MUST DOCUMENT ALL) Outcome: Progressing   Problem: Ischemic Stroke/TIA Tissue Perfusion: Goal: Complications of ischemic stroke/TIA will be minimized Outcome: Progressing   Problem: Education: Goal: Knowledge of General Education information will improve Description: Including pain rating scale, medication(s)/side effects and non-pharmacologic comfort measures Outcome: Progressing   Problem: Clinical Measurements: Goal: Cardiovascular complication will be avoided Outcome: Progressing

## 2024-07-13 NOTE — Plan of Care (Signed)
°  Problem: Education: Goal: Knowledge of disease or condition will improve 07/13/2024 0410 by Talbert Calvin BIRCH, RN Outcome: Progressing 07/13/2024 0340 by Talbert Calvin BIRCH, RN Outcome: Progressing Goal: Knowledge of secondary prevention will improve (MUST DOCUMENT ALL) Outcome: Progressing   Problem: Education: Goal: Knowledge of secondary prevention will improve (MUST DOCUMENT ALL) Outcome: Progressing   Problem: Ischemic Stroke/TIA Tissue Perfusion: Goal: Complications of ischemic stroke/TIA will be minimized 07/13/2024 0410 by Talbert Calvin BIRCH, RN Outcome: Progressing 07/13/2024 0340 by Talbert Calvin BIRCH, RN Outcome: Progressing   Problem: Education: Goal: Knowledge of General Education information will improve Description: Including pain rating scale, medication(s)/side effects and non-pharmacologic comfort measures Outcome: Progressing   Problem: Clinical Measurements: Goal: Cardiovascular complication will be avoided 07/13/2024 0410 by Talbert Calvin BIRCH, RN Outcome: Progressing 07/13/2024 0340 by Talbert Calvin BIRCH, RN Outcome: Progressing   Problem: Coping: Goal: Level of anxiety will decrease Outcome: Not Progressing   Problem: Pain Managment: Goal: General experience of comfort will improve and/or be controlled Outcome: Progressing

## 2024-07-13 NOTE — Progress Notes (Addendum)
 Alert to person/place/year, but restless in the bed and pulling at wires/cords and attempts to get up w/out calling for assistance. Redirects easily, but continues to pull at wires/monitoring devices when staff leaves the room. He does state that he drinks alcohol everyday, but when asked how much he drinks he states, I dont know. Unknown last time he had a drink as well. Continuing to monitor and will notify Dr Marcene to make aware.

## 2024-07-13 NOTE — Progress Notes (Signed)
 [facility] night cross cover note:   I was notified by the patient's RN that the patient, will remain alert and oriented, has become progressively more restless overnight.  He conveys to the RN that he consumes alcohol on a daily basis, although does not reveal the typical volume of his baseline daily consumption.  Given that this is hospital day 8, suspect that he may have already been through withdrawal symptoms relating to alcohol cessation.  However, I have preliminarily placed CIWA orders, and noted existing orders for daily multivitamin, folate, and thiamine  supplementation.     Eva Pore, DO Hospitalist

## 2024-07-13 NOTE — Progress Notes (Signed)
 Patient starting last night was showing signs ands symptoms of alcohol withdrawal, nightshift noticed it and got CIWA protocol started, throughout the morning the patient got more confused even after a lower dose of ativan  was given this morning prior to fall. Pt was seen going over the bed rail, landing on his bottom and then standing back up. Vitals are stable no changes system wise were noted other than increasing confusion related to withdrawal, Ativan  regiven, MD drusilla was at bedside 2 min after fall.     07/13/24 1050 07/13/24 1100  What Happened  Was fall witnessed? Yes  --   Who witnessed fall? RN  --   Patients activity before fall ambulating-assisted  --   Point of contact buttocks  --   Was patient injured? No  --   Provider Notification  Provider Name/Title Lama  --   Date Provider Notified 07/13/24  --   Time Provider Notified 1107  --   Method of Notification Page;Face-to-face;Rounds  --   Notification Reason Fall  --   Provider response At bedside  --   Date of Provider Response 07/13/24  --   Time of Provider Response 1052  --   Follow Up  Family notified No - patient refusal  --   Additional tests No  --   Adult Fall Risk Assessment  Risk Factor Category (scoring not indicated) High fall risk per protocol (document High fall risk)  --   Patient Fall Risk Level High fall risk  --   Adult Fall Risk Interventions  Required Bundle Interventions *See Row Information* High fall risk  --   Required Bundle Interventions *See Row Information* High fall risk  --   Additional Interventions Use of appropriate toileting equipment (bedpan, BSC, etc.);PT/OT need assessed if change in mobility from baseline  --   Fall intervention(s) refused/Patient educated regarding refusal Bed alarm;Nonskid socks;Open door if unsupervised;Yellow bracelet;Supervision while toileting/edge of bed sitting  --   Screening for Fall Injury Risk (To be completed on HIGH fall risk patients) - Assessing Need  for Floor Mats  Risk For Fall Injury- Criteria for Floor Mats None identified - No additional interventions needed  --   Vitals  Temp  --  97.6 F (36.4 C)  Temp Source  --  Oral  BP Location  --  Left Arm  BP Method  --  Automatic  Patient Position (if appropriate)  --  Sitting  Pulse Rate  --  (!) 48  Pulse Rate Source  --  Monitor  Cardiac Rhythm  --  NSR;SB  Resp  --  16  Oxygen Therapy  SpO2  --  100 %  O2 Device  --  Room Air  Pain Assessment  Pain Scale  --  0-10  Pain Score  --  0  Neurological  Neuro (WDL)  --  X  Level of Consciousness  --  Alert  Orientation Level  --  Disoriented X4  Cognition  --  Poor attention/concentration;Poor judgement;Poor safety awareness;Impulsive  Speech  --  Clear  R Pupil Size (mm)  --  3  R Pupil Shape  --  Round  R Pupil Reaction  --  Brisk  L Pupil Size (mm)  --  3  L Pupil Shape  --  Round  L Pupil Reaction  --  Brisk  Glasgow Coma Scale  Eye Opening  --  4  Best Verbal Response (NON-intubated)  --  4  Best Motor Response  --  5  Glasgow Coma Scale Score  --  13  NIH Stroke Scale   Dizziness Present  --  No  Headache Present  --  No  Interval  --  Other (Comment)  Level of Consciousness (1a.)     --  0  LOC Questions (1b. )     --  0  LOC Commands (1c. )     --  0  Best Gaze (2. )    --  0  Visual (3. )    --  0  Facial Palsy (4. )      --  0  Motor Arm, Left (5a. )     --  0  Motor Arm, Right (5b. )   --  0  Motor Leg, Left (6a. )    --  0  Motor Leg, Right (6b. )   --  0  Limb Ataxia (7. )  --  0  Sensory (8. )    --  0  Best Language (9. )    --  0  Dysarthria (10. )  --  0  Extinction/Inattention (11.)     --  0  Complete NIHSS TOTAL  --  0  Musculoskeletal  Musculoskeletal (WDL)  --  X  Assistive Device  --  Front wheel walker  Generalized Weakness  --  Yes  Weight Bearing Restrictions Per Provider Order  --  No  Integumentary  Integumentary (WDL)  --  X  Skin Color  --  Appropriate for ethnicity  Skin  Condition  --  Dry  Skin Integrity  --  Intact  Skin Turgor  --  Non-tenting  Pain Screening  Clinical Progression  --  Not changed    Laneta JAYSON Rao, RN 07/13/2024 11:20 AM

## 2024-07-13 NOTE — Progress Notes (Addendum)
 Progress Note    07/13/2024 7:45 AM 1 Day Post-Op  Subjective: Says that he did not sleep well overnight.  Denies any pain    Vitals:   07/13/24 0600 07/13/24 0732  BP: 128/69 113/77  Pulse: (!) 47 96  Resp:  15  Temp:  97.7 F (36.5 C)  SpO2: 100% 99%    Physical Exam: General: Resting comfortably in bed, NAD Cardiac: Regular rate and rhythm Lungs: Nonlabored Incisions: Right sided neck incision c/d/l  Extremities: Moving all extremities equally Neuro: Alert and oriented x 3, no slurred speech, no facial droop  CBC    Component Value Date/Time   WBC 9.7 07/13/2024 0250   RBC 3.73 (L) 07/13/2024 0250   HGB 11.4 (L) 07/13/2024 0250   HGB 13.5 06/12/2019 1156   HGB 13.0 05/16/2018 1219   HCT 34.5 (L) 07/13/2024 0250   HCT 39.1 05/16/2018 1219   PLT 241 07/13/2024 0250   PLT 266 06/12/2019 1156   PLT 199 05/16/2018 1219   MCV 92.5 07/13/2024 0250   MCV 91 05/16/2018 1219   MCH 30.6 07/13/2024 0250   MCHC 33.0 07/13/2024 0250   RDW 12.3 07/13/2024 0250   RDW 12.1 (L) 05/16/2018 1219   LYMPHSABS 1.6 07/04/2024 1045   LYMPHSABS 1.0 05/16/2018 1219   MONOABS 1.0 07/04/2024 1045   EOSABS 0.3 07/04/2024 1045   EOSABS 0.0 05/16/2018 1219   BASOSABS 0.1 07/04/2024 1045   BASOSABS 0.1 05/16/2018 1219    BMET    Component Value Date/Time   NA 133 (L) 07/13/2024 0250   NA 132 (L) 04/13/2024 1016   K 4.1 07/13/2024 0250   CL 106 07/13/2024 0250   CO2 22 07/13/2024 0250   GLUCOSE 121 (H) 07/13/2024 0250   BUN 9 07/13/2024 0250   BUN 11 04/13/2024 1016   CREATININE 0.62 07/13/2024 0250   CREATININE 0.86 08/12/2020 0759   CALCIUM  8.4 (L) 07/13/2024 0250   GFRNONAA >60 07/13/2024 0250   GFRNONAA 90 08/12/2020 0759   GFRAA 104 08/12/2020 0759    INR    Component Value Date/Time   INR 1.0 08/12/2020 0759     Intake/Output Summary (Last 24 hours) at 07/13/2024 0745 Last data filed at 07/13/2024 0600 Gross per 24 hour  Intake 3427.05 ml  Output 950  ml  Net 2477.05 ml      Assessment/Plan:  73 y.o. male is 1 day postop, s/p: Right TCAR   - He says he is feeling okay this morning.  He denies any pain.  He was very restless overnight, possibly due to alcohol withdrawal -He remains hemodynamically stable.  He is normotensive on A-line.  He has a regular rate and rhythm -Right sided neck incision is dry and intact without hematoma -He denies any worsening strokelike symptoms.  He has no weakness or numbness on exam.  He has no slurred speech. -Continue aspirin , statin, and Plavix .  Stable for discharge from vascular perspective.  Will arrange follow-up with our office in 4 weeks with carotid duplex   Ahmed Holster, PA-C [facility] [phone] 07/13/2024 7:45 AM  I have seen and evaluated the patient. I agree with the PA note as documented above.  Postop day 1 status post right TCAR for symptomatic greater than 50% stenosis.  Looks good this morning.  Grossly neurologically intact.  Plan aspirin  Plavix  statin at discharge.  Will arrange follow-up in 4 weeks with carotid duplex.  Stable for discharge from our perspective.  Lonni DOROTHA Gaskins, MD  [facility] [place] Office: [phone]

## 2024-07-13 NOTE — Progress Notes (Signed)
 [facility]  PROGRESS NOTE  Calvin Gardner FMW:[id number] DOB: Nov 14, 1950 DOA: 07/04/2024 PCP: Delbert Clam, MD   Brief HPI:   73 y.o. male with medical history significant of alcoholism, history of hep C, liver cancer, GERD, hyperlipidemia, hypertension, pulmonary nodule, substance abuse who presented to the emergency department with complaints of an intermittent headache associated with blurred vision, dizziness and falls with reported injuries that started about 2 weeks ago. CT head showed no acute intracranial abnormality. MRI of the brain showed patchy and indistinct combined acute and subacute ischemia in the posterior right internal capsule, adjacent deep gray nuclei and temporal lobe white matter. 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  Neurology consulted, patient admitted for further management.   He's now s/p TCAR by vascular.      Assessment/Plan:   Acute/subacute CVA MRI brain revealed acute and subacute ischemia in the posterior right internal capsule, adjacent deep gray nuclei and temporal lobe white matter.  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  CTA head/neck with plaque in R carotid bulb, calcified plaque in L carotid bulb MRI 12/6 with expected evolution of subacute nonhemorrhagic infarcts in the posterior limb right internal capsule and right temporal lobe with extensive T2 and flair hyperintensity in the R temporal lobe without associated enhancement - no new or progressive infarct - extensive T2 hyperintensities in the central pons Vascular on board, now s/p TCAR 12/10 - noted post TCAR bradycardia, will monitor, he's asymptomatic Neurology/stroke team on board, discussed with Dr. Jerri, recommend at least 3 months of DAPT (Vs per vascular surgery recs) Echo showed EF of  55 to 60%, no regional wall motion abnormality, left ventricular diastolic parameters normal LDL 56, A1c 5.3 Continue aspirin , Plavix , statins PT/OT/SLP, recommend outpatient PT/OT Frequent neurochecks   Essential hypertension BP stable Holding PTA amlodipine    Alcohol abuse Drinking 96 to 120 ounces of beer daily Started getting restless this morning, intermittent hallucinations -Started on Ativan  per CIWA protocol   Tobacco abuse Tobacco cessation advised. Nicotine  replacement therapy ordered.   History of hep C/liver carcinoma Outpatient follow-up   Pulmonary nodule Follow-up with PCP for pulmonary referral as an outpatient      DVT prophylaxis: Lovenox   Medications     aspirin   81 mg Oral Daily   atorvastatin   20 mg Oral Daily   Chlorhexidine  Gluconate Cloth  6 each Topical Daily   clopidogrel   75 mg Oral Daily   enoxaparin  (LOVENOX ) injection  40 mg Subcutaneous Q24H   feeding supplement  237 mL Oral BID BM   folic acid   1 mg Oral Daily   multivitamin with minerals  1 tablet Oral Daily   mupirocin  ointment  1 Application Nasal BID   polyethylene glycol  17 g Oral BID   senna-docusate  1 tablet Oral BID   thiamine   100 mg Oral Daily   Or   thiamine   100 mg Intravenous Daily     Data Reviewed:   CBG:  Recent Labs  Lab 07/08/24 1709  GLUCAP 131*    SpO2: 100 %    Vitals:   07/13/24 0732 07/13/24 0800 07/13/24 1100 07/13/24 1500  BP: 113/77   121/66  Pulse: 96 (!) 36 (!) 48 60  Resp: 15  16 12   Temp: 97.7 F (36.5 C)  97.6 F (36.4 C) (!)  97.4 F (36.3 C)  TempSrc: Oral  Oral Oral  SpO2: 99% 100% 100% 100%  Weight:      Height:          Data Reviewed:  Basic Metabolic Panel: Recent Labs  Lab 07/12/24 0252 07/13/24 0250  NA 136 133*  K 4.4 4.1  CL 102 106  CO2 28 22  GLUCOSE 105* 121*  BUN 11 9  CREATININE 1.04 0.62  CALCIUM  9.6 8.4*  MG  --  2.0  PHOS  --  2.8    CBC: Recent Labs  Lab 07/12/24 0252 07/13/24 0250   WBC 8.8 9.7  HGB 13.2 11.4*  HCT 40.8 34.5*  MCV 90.9 92.5  PLT 294 241    LFT Recent Labs  Lab 07/13/24 0250  AST 27  ALT 15  ALKPHOS 41  BILITOT 0.6  PROT 6.2*  ALBUMIN 3.1*     Antibiotics: Anti-infectives (From admission, onward)    Start     Dose/Rate Route Frequency Ordered Stop   07/12/24 1930  ceFAZolin  (ANCEF ) IVPB 2g/100 mL premix        2 g 200 mL/hr over 30 Minutes Intravenous Every 8 hours 07/12/24 1341 07/13/24 0306   07/12/24 1029  ceFAZolin  (ANCEF ) 2-4 GM/100ML-% IVPB       Note to Pharmacy: Virgil Ee: cabinet override      07/12/24 1029 07/12/24 2244        CONSULTS vascular surgery, neurology  Code Status: Full code  Family Communication: No family at bedside     Subjective   Started getting restless last night conveyed to the RN that he drinks alcohol on daily basis.  This morning received Ativan .  Has been hallucinating intermittently since this morning   Objective    Physical Examination:   General-appears in no acute distress Heart-S1-S2, regular, no murmur auscultated Lungs-clear to auscultation bilaterally, no wheezing or crackles auscultated Abdomen-soft, nontender, no organomegaly Extremities-no edema in the lower extremities Neuro-alert, oriented x3, no focal deficit noted             Charmelle Soh S [name]   [facility] If 7PM-7AM, please contact night-coverage at [email or web address], Office  [phone]   07/13/2024, 4:12 PM  LOS: 9 days

## 2024-07-14 ENCOUNTER — Other Ambulatory Visit: Payer: Self-pay

## 2024-07-14 DIAGNOSIS — K219 Gastro-esophageal reflux disease without esophagitis: Secondary | ICD-10-CM

## 2024-07-14 DIAGNOSIS — I6529 Occlusion and stenosis of unspecified carotid artery: Secondary | ICD-10-CM

## 2024-07-14 LAB — GLUCOSE, CAPILLARY
Glucose-Capillary: 91 mg/dL (ref 70–99)
Glucose-Capillary: 83 mg/dL (ref 70–99)
Glucose-Capillary: 87 mg/dL (ref 70–99)

## 2024-07-14 MED ORDER — SODIUM CHLORIDE 0.9 % IV BOLUS
1000.0000 mL | Freq: Once | INTRAVENOUS | Status: AC
  Administered 2024-07-15: 1000 mL via INTRAVENOUS

## 2024-07-14 NOTE — Progress Notes (Signed)
 Physical Therapy Treatment Patient Details Name: Calvin Gardner MRN: [id number] DOB: [birth year] Today's Date: 07/14/2024   History of Present Illness 73 yo male presented 12/2 HA, blurred vision, dizziness and falls CT (+) acute territorial infarct, MRI (+) acute subacute ischemia in posterior R internal capsule, temporal lobe, small subacute infarct in R MCA motor strip PMH ETOH abuse, aortic atherosclerosis, OA, Hep C, liver CA, cataract,HLD, HTN, Pulmonary nodule, substance abuse, smoker    PT Comments  Pt resting in bed on arrival and agreeable to session. Pt needing increased assist to maintain balance this session due to posterior bias in standing and pt stepping outside RW when turning during gait. Pt completing bed mobility at mod I level. Pt requiring light min A to steady on rise to stand and up to min A to steady during gait with RW for support. Pt continues to have decreased insight into current deficits and remains at risk for falls. Current plan remains appropriate to address deficits and maximize functional independence and safety, pending family able to provide assist level outlined below. Pt continues to benefit from skilled PT services to progress toward functional mobility goals.     If plan is discharge home, recommend the following: Assist for transportation;Help with stairs or ramp for entrance;A little help with walking and/or transfers;A little help with bathing/dressing/bathroom;Assistance with cooking/housework;Supervision due to cognitive status   Can travel by private vehicle        Equipment Recommendations  Rolling walker (2 wheels)    Recommendations for Other Services       Precautions / Restrictions Precautions Precautions: Fall Recall of Precautions/Restrictions: Intact Precaution/Restrictions Comments: SBP <180 Restrictions Weight Bearing Restrictions Per Provider Order: No     Mobility  Bed Mobility Overal bed mobility: Modified Independent              General bed mobility comments: no assist needed, + use of bed features    Transfers Overall transfer level: Needs assistance Equipment used: Rolling walker (2 wheels) Transfers: Sit to/from Stand Sit to Stand: Min assist           General transfer comment: min A with cues for hand placement, some increased posterior lean this session on rise and in static standing, pt able to correct with cues    Ambulation/Gait Ambulation/Gait assistance: Contact guard assist, Min assist Gait Distance (Feet): 200 Feet Assistive device: Rolling walker (2 wheels) Gait Pattern/deviations: Step-through pattern, Decreased stride length Gait velocity: decreased     General Gait Details: pt needing increased cues to attend to L einvironement, light min A to maintain balance with turning as pt stepping outside RW,   Stairs             Wheelchair Mobility     Tilt Bed    Modified Rankin (Stroke Patients Only) Modified Rankin (Stroke Patients Only) Pre-Morbid Rankin Score: No symptoms Modified Rankin: Slight disability     Balance Overall balance assessment: Needs assistance Sitting-balance support: No upper extremity supported, Feet supported Sitting balance-Leahy Scale: Good Sitting balance - Comments: sitting EOB   Standing balance support: No upper extremity supported, Single extremity supported, During functional activity Standing balance-Leahy Scale: Poor Standing balance comment: reliant on UE support                            Communication Communication Communication: No apparent difficulties  Cognition Arousal: Alert Behavior During Therapy: WFL for tasks assessed/performed, Impulsive   PT -  Cognitive impairments: No family/caregiver present to determine baseline, Awareness, Attention, Sequencing, Safety/Judgement                       PT - Cognition Comments: impulsive with moving quickly and decreased attention to task Following  commands: Impaired Following commands impaired: Follows multi-step commands inconsistently, Follows one step commands inconsistently, Follows one step commands with increased time    Cueing Cueing Techniques: Verbal cues, Tactile cues, Visual cues  Exercises      General Comments General comments (skin integrity, edema, etc.): VSS on RA      Pertinent Vitals/Pain Pain Assessment Pain Assessment: No/denies pain    Home Living                          Prior Function            PT Goals (current goals can now be found in the care plan section) Acute Rehab PT Goals Patient Stated Goal: to go home PT Goal Formulation: With patient Time For Goal Achievement: 07/19/24 Progress towards PT goals: Progressing toward goals    Frequency    Min 2X/week      PT Plan      Co-evaluation              AM-PAC PT 6 Clicks Mobility   Outcome Measure  Help needed turning from your back to your side while in a flat bed without using bedrails?: None Help needed moving from lying on your back to sitting on the side of a flat bed without using bedrails?: None Help needed moving to and from a bed to a chair (including a wheelchair)?: A Little Help needed standing up from a chair using your arms (e.g., wheelchair or bedside chair)?: A Little Help needed to walk in hospital room?: A Little Help needed climbing 3-5 steps with a railing? : A Little 6 Click Score: 20    End of Session Equipment Utilized During Treatment: Gait belt Activity Tolerance: Patient tolerated treatment well Patient left: in bed;with call bell/phone within reach;with bed alarm set Nurse Communication: Mobility status PT Visit Diagnosis: Unsteadiness on feet (R26.81);Other abnormalities of gait and mobility (R26.89)     Time: 1411-1435 PT Time Calculation (min) (ACUTE ONLY): 24 min  Charges:    $Gait Training: 8-22 mins $Therapeutic Activity: 8-22 mins PT General Charges $$ ACUTE PT  VISIT: 1 Visit                     Braden Deloach R. PTA Acute Rehabilitation Services Office: [phone]   Therisa CHRISTELLA Boor 07/14/2024, 3:01 PM

## 2024-07-14 NOTE — Progress Notes (Signed)
 [facility]  PROGRESS NOTE  Calvin Gardner FMW:[id number] DOB: [birth year] DOA: 07/04/2024 PCP: Delbert Clam, MD   Brief HPI:   73 y.o. male with medical history significant of alcoholism, history of hep C, liver cancer, GERD, hyperlipidemia, hypertension, pulmonary nodule, substance abuse who presented to the emergency department with complaints of an intermittent headache associated with blurred vision, dizziness and falls with reported injuries that started about 2 weeks ago. CT head showed no acute intracranial abnormality. MRI of the brain showed patchy and indistinct combined acute and subacute ischemia in the posterior right internal capsule, adjacent deep gray nuclei and temporal lobe white matter. 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  Neurology consulted, patient admitted for further management.   He's now s/p TCAR by vascular.      Assessment/Plan:   Acute/subacute CVA MRI brain revealed acute and subacute ischemia in the posterior right internal capsule, adjacent deep gray nuclei and temporal lobe white matter.  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  CTA head/neck with plaque in R carotid bulb, calcified plaque in L carotid bulb MRI 12/6 with expected evolution of subacute nonhemorrhagic infarcts in the posterior limb right internal capsule and right temporal lobe with extensive T2 and flair hyperintensity in the R temporal lobe without associated enhancement - no new or progressive infarct - extensive T2 hyperintensities in the central pons Vascular on board, now s/p TCAR 12/10 - noted post TCAR bradycardia, will monitor, he's asymptomatic Neurology/stroke team on board, discussed with Dr. Jerri, recommend at least 3 months of DAPT (Vs per vascular surgery recs) Echo showed EF of  55 to 60%, no regional wall motion abnormality, left ventricular diastolic parameters normal LDL 56, A1c 5.3 Continue aspirin , Plavix , statins PT/OT/SLP, recommend outpatient PT/OT Frequent neurochecks   Essential hypertension BP stable Holding PTA amlodipine   Hypotension -Blood pressure has been soft - Continue to hold amlodipine , -Continue to monitor   Alcohol abuse Drinking 96 to 120 ounces of beer daily Started getting restless this morning, intermittent hallucinations -Started on Ativan  per CIWA protocol   Tobacco abuse Tobacco cessation advised. Nicotine  replacement therapy ordered.   History of hep C/liver carcinoma Outpatient follow-up   Pulmonary nodule Follow-up with PCP for pulmonary referral as an outpatient      DVT prophylaxis: Lovenox   Medications     aspirin   81 mg Oral Daily   atorvastatin   20 mg Oral Daily   clopidogrel   75 mg Oral Daily   enoxaparin  (LOVENOX ) injection  40 mg Subcutaneous Q24H   feeding supplement  237 mL Oral BID BM   folic acid   1 mg Oral Daily   multivitamin with minerals  1 tablet Oral Daily   mupirocin  ointment  1 Application Nasal BID   polyethylene glycol  17 g Oral BID   senna-docusate  1 tablet Oral BID   thiamine   100 mg Oral Daily   Or   thiamine   100 mg Intravenous Daily     Data Reviewed:   CBG:  Recent Labs  Lab 07/08/24 1709 07/14/24 0840 07/14/24 1213 07/14/24 1639  GLUCAP 131* 91 83 87    SpO2: 100 %    Vitals:   07/14/24 0500 07/14/24 0600 07/14/24 1214 07/14/24 1641  BP: 128/67 (!) 114/102 (!) 148/71 (!) 85/70  Pulse: (!) 45 (!) 53 64 83  Resp: 16 19 16 16   Temp:   98 F (36.7 C) 98 F (36.7 C)  TempSrc:   Oral Oral  SpO2: 100% 100% 100% 100%  Weight:      Height:          Data Reviewed:  Basic Metabolic Panel: Recent Labs  Lab 07/12/24 0252 07/13/24 0250  NA 136 133*  K 4.4 4.1  CL 102 106  CO2 28 22  GLUCOSE 105* 121*  BUN 11 9  CREATININE 1.04 0.62  CALCIUM  9.6  8.4*  MG  --  2.0  PHOS  --  2.8    CBC: Recent Labs  Lab 07/12/24 0252 07/13/24 0250  WBC 8.8 9.7  HGB 13.2 11.4*  HCT 40.8 34.5*  MCV 90.9 92.5  PLT 294 241    LFT Recent Labs  Lab 07/13/24 0250  AST 27  ALT 15  ALKPHOS 41  BILITOT 0.6  PROT 6.2*  ALBUMIN 3.1*     Antibiotics: Anti-infectives (From admission, onward)    Start     Dose/Rate Route Frequency Ordered Stop   07/12/24 1930  ceFAZolin  (ANCEF ) IVPB 2g/100 mL premix        2 g 200 mL/hr over 30 Minutes Intravenous Every 8 hours 07/12/24 1341 07/13/24 0306   07/12/24 1029  ceFAZolin  (ANCEF ) 2-4 GM/100ML-% IVPB       Note to Pharmacy: Virgil Ee: cabinet override      07/12/24 1029 07/12/24 2244        CONSULTS vascular surgery, neurology  Code Status: Full code  Family Communication: No family at bedside     Subjective   Blood pressure has been running low.  Denies any complaints.  Has been taken off soft restraints.   Objective    Physical Examination:   General-appears in no acute distress Heart-S1-S2, regular, no murmur auscultated Lungs-clear to auscultation bilaterally, no wheezing or crackles auscultated Abdomen-soft, nontender, no organomegaly Extremities-no edema in the lower extremities Neuro-alert, oriented x3, no focal deficit noted             [name] [name]   [facility] If 7PM-7AM, please contact night-coverage at [email or web address], Office  [phone]   07/14/2024, 5:13 PM  LOS: 10 days

## 2024-07-14 NOTE — Plan of Care (Signed)
°  Problem: Education: Goal: Knowledge of disease or condition will improve Outcome: Progressing Goal: Knowledge of secondary prevention will improve (MUST DOCUMENT ALL) Outcome: Progressing Goal: Knowledge of patient specific risk factors will improve (DELETE if not current risk factor) Outcome: Progressing   Problem: Ischemic Stroke/TIA Tissue Perfusion: Goal: Complications of ischemic stroke/TIA will be minimized Outcome: Progressing   Problem: Coping: Goal: Will verbalize positive feelings about self Outcome: Progressing Goal: Will identify appropriate support needs Outcome: Progressing   Problem: Health Behavior/Discharge Planning: Goal: Ability to manage health-related needs will improve Outcome: Progressing Goal: Goals will be collaboratively established with patient/family Outcome: Progressing   Problem: Self-Care: Goal: Ability to participate in self-care as condition permits will improve Outcome: Progressing Goal: Verbalization of feelings and concerns over difficulty with self-care will improve Outcome: Progressing Goal: Ability to communicate needs accurately will improve Outcome: Progressing   Problem: Nutrition: Goal: Risk of aspiration will decrease Outcome: Progressing Goal: Dietary intake will improve Outcome: Progressing   Problem: Education: Goal: Knowledge of General Education information will improve Description: Including pain rating scale, medication(s)/side effects and non-pharmacologic comfort measures Outcome: Progressing   Problem: Health Behavior/Discharge Planning: Goal: Ability to manage health-related needs will improve Outcome: Progressing   Problem: Clinical Measurements: Goal: Ability to maintain clinical measurements within normal limits will improve Outcome: Progressing Goal: Will remain free from infection Outcome: Progressing Goal: Diagnostic test results will improve Outcome: Progressing Goal: Respiratory complications will  improve Outcome: Progressing Goal: Cardiovascular complication will be avoided Outcome: Progressing   Problem: Activity: Goal: Risk for activity intolerance will decrease Outcome: Progressing   Problem: Nutrition: Goal: Adequate nutrition will be maintained Outcome: Progressing   Problem: Coping: Goal: Level of anxiety will decrease Outcome: Progressing   Problem: Elimination: Goal: Will not experience complications related to bowel motility Outcome: Progressing Goal: Will not experience complications related to urinary retention Outcome: Progressing   Problem: Pain Managment: Goal: General experience of comfort will improve and/or be controlled Outcome: Progressing   Problem: Safety: Goal: Ability to remain free from injury will improve Outcome: Progressing   Problem: Skin Integrity: Goal: Risk for impaired skin integrity will decrease Outcome: Progressing   Problem: Education: Goal: Knowledge of discharge needs will improve Outcome: Progressing   Problem: Clinical Measurements: Goal: Postoperative complications will be avoided or minimized Outcome: Progressing   Problem: Respiratory: Goal: Will achieve and/or maintain a regular respiratory rate, without signs or symptoms of dyspnea Outcome: Progressing   Problem: Skin Integrity: Goal: Demonstration of wound healing without infection will improve Outcome: Progressing   Problem: Safety: Goal: Non-violent Restraint(s) Outcome: Progressing

## 2024-07-14 NOTE — Progress Notes (Signed)
 OT Cancellation Note  Patient Details Name: Calvin Gardner MRN: [id number] DOB: 06-21-1951   Cancelled Treatment:    Reason Eval/Treat Not Completed: Other (comment).  Pt. Awake in bed upon arrival to room.  Attempted skilled OT treatment session.  Pt. Declined all recommendations and encouragement for participation in therapeutic tasks for today.  Will attempt back as schedule allows.    Dannielle Delon Jean 07/14/2024, 9:17 AM

## 2024-07-14 NOTE — Progress Notes (Addendum)
°  Progress Note    07/14/2024 6:47 AM 2 Days Post-Op  Subjective:  confused overnight and in soft restraints.  Wakes easily for me and follows commands.   afebrile  Vitals:   07/14/24 0500 07/14/24 0600  BP: 128/67 (!) 114/102  Pulse: (!) 45 (!) 53  Resp: 16 19  Temp:    SpO2: 100% 100%    Physical Exam: General:  no distress Lungs:  non labored Incisions:  righ neck incision is clean and in tact without hematoma.  Neuro:  tongue is midline and moving extremities equally.  Following commands.  Alert to year.   CBC    Component Value Date/Time   WBC 9.7 07/13/2024 0250   RBC 3.73 (L) 07/13/2024 0250   HGB 11.4 (L) 07/13/2024 0250   HGB 13.5 06/12/2019 1156   HGB 13.0 05/16/2018 1219   HCT 34.5 (L) 07/13/2024 0250   HCT 39.1 05/16/2018 1219   PLT 241 07/13/2024 0250   PLT 266 06/12/2019 1156   PLT 199 05/16/2018 1219   MCV 92.5 07/13/2024 0250   MCV 91 05/16/2018 1219   MCH 30.6 07/13/2024 0250   MCHC 33.0 07/13/2024 0250   RDW 12.3 07/13/2024 0250   RDW 12.1 (L) 05/16/2018 1219   LYMPHSABS 1.6 07/04/2024 1045   LYMPHSABS 1.0 05/16/2018 1219   MONOABS 1.0 07/04/2024 1045   EOSABS 0.3 07/04/2024 1045   EOSABS 0.0 05/16/2018 1219   BASOSABS 0.1 07/04/2024 1045   BASOSABS 0.1 05/16/2018 1219    BMET    Component Value Date/Time   NA 133 (L) 07/13/2024 0250   NA 132 (L) 04/13/2024 1016   K 4.1 07/13/2024 0250   CL 106 07/13/2024 0250   CO2 22 07/13/2024 0250   GLUCOSE 121 (H) 07/13/2024 0250   BUN 9 07/13/2024 0250   BUN 11 04/13/2024 1016   CREATININE 0.62 07/13/2024 0250   CREATININE 0.86 08/12/2020 0759   CALCIUM  8.4 (L) 07/13/2024 0250   GFRNONAA >60 07/13/2024 0250   GFRNONAA 90 08/12/2020 0759   GFRAA 104 08/12/2020 0759    INR    Component Value Date/Time   INR 1.0 08/12/2020 0759     Intake/Output Summary (Last 24 hours) at 07/14/2024 0647 Last data filed at 07/13/2024 2300 Gross per 24 hour  Intake 32.81 ml  Output 500 ml  Net  -467.19 ml      Assessment/Plan:  73 y.o. male is s/p:  Right TCAR 07/12/2024 by Dr. Gretta for symptomatic carotid artery stenosis 2 Days Post-Op   -pt neuro in tact and right neck incision is clean without hematoma.   -DVT prophylaxis:  Lovenox  -continue asa/statin/plavix  -pt has f/u with carotid duplex scheduled for next month. -ok for discharge from vascular standpoint.  Please call with questions.    Lucie Apt, PA-C [facility] [phone] 07/14/2024 6:47 AM  I have seen and evaluated the patient. I agree with the PA note as documented above.  Patient underwent right TCAR on Wednesday for symptomatic over 60% stenosis.  Continue aspirin  Plavix  statin.  1 month follow-up.  Neurologically following commands this morning and moving all his extremities.  Some confusion overnight and now in restraints with history of alcohol abuse.  Lonni DOROTHA Gretta, MD [facility] Office: [phone]

## 2024-07-15 ENCOUNTER — Other Ambulatory Visit (HOSPITAL_COMMUNITY): Payer: Self-pay

## 2024-07-15 DIAGNOSIS — K219 Gastro-esophageal reflux disease without esophagitis: Secondary | ICD-10-CM | POA: Diagnosis not present

## 2024-07-15 DIAGNOSIS — F1010 Alcohol abuse, uncomplicated: Secondary | ICD-10-CM | POA: Diagnosis not present

## 2024-07-15 DIAGNOSIS — I639 Cerebral infarction, unspecified: Secondary | ICD-10-CM | POA: Diagnosis not present

## 2024-07-15 DIAGNOSIS — I10 Essential (primary) hypertension: Secondary | ICD-10-CM | POA: Diagnosis not present

## 2024-07-15 MED ORDER — CLOPIDOGREL BISULFATE 75 MG PO TABS
ORAL_TABLET | ORAL | 0 refills | Status: DC
  Filled 2024-07-15: qty 90, 90d supply, fill #0

## 2024-07-15 MED ORDER — CLOPIDOGREL BISULFATE 75 MG PO TABS
ORAL_TABLET | ORAL | 0 refills | Status: DC
  Filled 2024-07-15: qty 90, 90d supply, fill #0
  Filled 2024-07-15: qty 90, fill #0

## 2024-07-15 MED ORDER — ATORVASTATIN CALCIUM 20 MG PO TABS
20.0000 mg | ORAL_TABLET | Freq: Every day | ORAL | 1 refills | Status: DC
  Filled 2024-07-15: qty 90, 90d supply, fill #0

## 2024-07-15 MED ORDER — THIAMINE HCL 100 MG PO TABS
100.0000 mg | ORAL_TABLET | Freq: Every day | ORAL | 0 refills | Status: DC
  Filled 2024-07-15: qty 30, 30d supply, fill #0

## 2024-07-15 MED ORDER — FOLIC ACID 1 MG PO TABS
1.0000 mg | ORAL_TABLET | Freq: Every day | ORAL | 0 refills | Status: DC
  Filled 2024-07-15 (×2): qty 30, 30d supply, fill #0

## 2024-07-15 MED ORDER — ADULT MULTIVITAMIN W/MINERALS CH
1.0000 | ORAL_TABLET | Freq: Every day | ORAL | 0 refills | Status: DC
  Filled 2024-07-15 (×2): qty 30, 30d supply, fill #0

## 2024-07-15 MED ORDER — AMLODIPINE BESYLATE 5 MG PO TABS
5.0000 mg | ORAL_TABLET | Freq: Every day | ORAL | 1 refills | Status: DC
  Filled 2024-07-15: qty 90, 90d supply, fill #0

## 2024-07-15 MED ORDER — ASPIRIN 81 MG PO CHEW
81.0000 mg | CHEWABLE_TABLET | Freq: Every day | ORAL | 0 refills | Status: DC
  Filled 2024-07-15 (×2): qty 90, 90d supply, fill #0

## 2024-07-15 NOTE — Plan of Care (Signed)
°  Problem: Education: Goal: Knowledge of disease or condition will improve Outcome: Progressing Goal: Knowledge of secondary prevention will improve (MUST DOCUMENT ALL) Outcome: Progressing Goal: Knowledge of patient specific risk factors will improve (DELETE if not current risk factor) Outcome: Progressing   Problem: Ischemic Stroke/TIA Tissue Perfusion: Goal: Complications of ischemic stroke/TIA will be minimized Outcome: Progressing   Problem: Coping: Goal: Will verbalize positive feelings about self Outcome: Progressing Goal: Will identify appropriate support needs Outcome: Progressing   Problem: Health Behavior/Discharge Planning: Goal: Ability to manage health-related needs will improve Outcome: Progressing Goal: Goals will be collaboratively established with patient/family Outcome: Progressing   Problem: Self-Care: Goal: Ability to participate in self-care as condition permits will improve Outcome: Progressing Goal: Verbalization of feelings and concerns over difficulty with self-care will improve Outcome: Progressing Goal: Ability to communicate needs accurately will improve Outcome: Progressing   Problem: Nutrition: Goal: Risk of aspiration will decrease Outcome: Progressing Goal: Dietary intake will improve Outcome: Progressing   Problem: Education: Goal: Knowledge of General Education information will improve Description: Including pain rating scale, medication(s)/side effects and non-pharmacologic comfort measures Outcome: Progressing   Problem: Health Behavior/Discharge Planning: Goal: Ability to manage health-related needs will improve Outcome: Progressing   Problem: Clinical Measurements: Goal: Ability to maintain clinical measurements within normal limits will improve Outcome: Progressing Goal: Will remain free from infection Outcome: Progressing Goal: Diagnostic test results will improve Outcome: Progressing Goal: Respiratory complications will  improve Outcome: Progressing Goal: Cardiovascular complication will be avoided Outcome: Progressing   Problem: Activity: Goal: Risk for activity intolerance will decrease Outcome: Progressing   Problem: Nutrition: Goal: Adequate nutrition will be maintained Outcome: Progressing   Problem: Coping: Goal: Level of anxiety will decrease Outcome: Progressing   Problem: Elimination: Goal: Will not experience complications related to bowel motility Outcome: Progressing Goal: Will not experience complications related to urinary retention Outcome: Progressing   Problem: Pain Managment: Goal: General experience of comfort will improve and/or be controlled Outcome: Progressing   Problem: Safety: Goal: Ability to remain free from injury will improve Outcome: Progressing   Problem: Skin Integrity: Goal: Risk for impaired skin integrity will decrease Outcome: Progressing   Problem: Education: Goal: Knowledge of discharge needs will improve Outcome: Progressing   Problem: Clinical Measurements: Goal: Postoperative complications will be avoided or minimized Outcome: Progressing   Problem: Respiratory: Goal: Will achieve and/or maintain a regular respiratory rate, without signs or symptoms of dyspnea Outcome: Progressing   Problem: Skin Integrity: Goal: Demonstration of wound healing without infection will improve Outcome: Progressing   Problem: Safety: Goal: Non-violent Restraint(s) Outcome: Progressing

## 2024-07-15 NOTE — Discharge Summary (Addendum)
 Physician Discharge Summary   Patient: Calvin Gardner MRN: [id number] DOB: [birth year]  Admit date:     07/04/2024  Discharge date: 07/15/2024  Discharge Physician: Sabas GORMAN Brod   PCP: Delbert Clam, MD   Recommendations at discharge:   Follow-up vascular surgery as outpatient  Discharge Diagnoses: Principal Problem:   Acute cerebrovascular accident (CVA) (HCC) Active Problems:   Essential hypertension, benign   Tobacco abuse   IFG (impaired fasting glucose)   Alcohol abuse   GERD (gastroesophageal reflux disease)   Pulmonary nodule  Resolved Problems:   * No resolved hospital problems. *  Hospital Course: 73 y.o. male with medical history significant of alcoholism, history of hep C, liver cancer, GERD, hyperlipidemia, hypertension, pulmonary nodule, substance abuse who presented to the emergency department with complaints of an intermittent headache associated with blurred vision, dizziness and falls with reported injuries that started about 2 weeks ago. CT head showed no acute intracranial abnormality. MRI of the brain showed patchy and indistinct combined acute and subacute ischemia in the posterior right internal capsule, adjacent deep gray nuclei and temporal lobe white matter. 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  Neurology consulted, patient admitted for further management.   He's now s/p TCAR by vascular.  Assessment and Plan:  Acute/subacute CVA MRI brain revealed acute and subacute ischemia in the posterior right internal capsule, adjacent deep gray nuclei and temporal lobe white matter.  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  CTA head/neck with plaque in R carotid bulb, calcified plaque in L carotid bulb MRI 12/6 with expected evolution of subacute nonhemorrhagic  infarcts in the posterior limb right internal capsule and right temporal lobe with extensive T2 and flair hyperintensity in the R temporal lobe without associated enhancement - no new or progressive infarct - extensive T2 hyperintensities in the central pons Vascular on board, now s/p TCAR 12/10 - noted post TCAR bradycardia, will monitor, he's asymptomatic Neurology/stroke team on board, discussed with Dr. Jerri, recommend at least 3 months of DAPT (Vs per vascular surgery recs) Echo showed EF of 55 to 60%, no regional wall motion abnormality, left ventricular diastolic parameters normal LDL 56, A1c 5.3 Continue aspirin , Plavix , statins PT/OT/SLP, recommend outpatient PT/OT    Essential hypertension BP stable Continue amlodipine    Hypotension -Resolved -Blood pressure has been soft - Continue to hold amlodipine ,    Alcohol abuse Drinking 96 to 120 ounces of beer daily Started getting restless this morning, intermittent hallucinations -Started on Ativan  per CIWA protocol -Currently alcohol withdrawal has significantly improved. Back to baseline   Tobacco abuse Tobacco cessation advised. Nicotine  replacement therapy ordered.   History of hep C/liver carcinoma Outpatient follow-up   Pulmonary nodule Follow-up with PCP for pulmonary referral as an outpatient        Consultants:  Procedures performed: TCAR Disposition: Home Diet recommendation:  Regular diet DISCHARGE MEDICATION: Allergies as of 07/15/2024   No Known Allergies      Medication List     TAKE these medications    acetaminophen  500 MG tablet Commonly known as: TYLENOL  Take 2 tablets (1,000 mg total) by mouth in the morning. What changed: how much to take   amLODipine  5 MG tablet Commonly known as: NORVASC  Take 1 tablet (5 mg total) by mouth daily.   aspirin  81 MG chewable tablet Chew  1 tablet (81 mg total) by mouth daily.   atorvastatin  20 MG tablet Commonly known as: LIPITOR Take 1 tablet  (20 mg total) by mouth daily.   clopidogrel  75 MG tablet Commonly known as: PLAVIX  Take 1 tablet by mouth daily   folic acid  1 MG tablet Commonly known as: FOLVITE  Take 1 tablet (1 mg total) by mouth daily.   multivitamin with minerals Tabs tablet Take 1 tablet by mouth daily.   thiamine  100 MG tablet Commonly known as: VITAMIN B1 Take 1 tablet (100 mg total) by mouth daily.               Durable Medical Equipment  (From admission, onward)           Start     Ordered   07/06/24 1152  For home use only DME Walker rolling  Once       Question Answer Comment  Walker: With 5 Inch Wheels   Patient needs a walker to treat with the following condition Intracranial hemorrhage (HCC)      07/06/24 1151            Contact information for follow-up providers     [facility]. Schedule an appointment as soon as possible for a visit.   Specialty: Rehabilitation Why: Referral made for outpt therapy Contact information: [address] [place]  [address] [phone]        [facility] Follow up.   Why: [employer] will deliver a RW to your hospital room before discharged to home. Contact information: [address] [place] [address] [phone]         [facility] transportation Follow up.   Why: call 2-3 days in advance to schedule transportation for appointments Contact information: [phone]        [facility]. Schedule an appointment as soon as possible for a visit in 1 month(s).   Specialty: Neurology Why: stroke clinic Contact information: [address] [place]  [address] [phone]        [facility] [place] Follow up in 4 week(s).   Specialty: Vascular Surgery Why: Office will call to arrange your appt(s) Contact information: [address] [place]  [address] [phone]             Contact information for after-discharge care     Durable Medical Equipment     [facility] (DME) .   Service: Durable Medical Equipment Contact information: [address] [place]  [address] [phone]                    Discharge Exam: Fredricka Weights   07/04/24 1621  Weight: 56.7 kg   General-appears in no acute distress Heart-S1-S2, regular, no murmur auscultated Lungs-clear to auscultation bilaterally, no wheezing or crackles auscultated Abdomen-soft, nontender, no organomegaly Extremities-no edema in the lower extremities Neuro-alert, oriented x3, no focal deficit noted  Condition at discharge: good  The results of significant diagnostics from this hospitalization (including imaging, microbiology, ancillary and laboratory) are listed below for reference.   Imaging Studies: HYBRID OR IMAGING ([facility] ONLY) Result Date: 07/12/2024 There is no interpretation for this exam.  This order is for images obtained during a surgical procedure.  Please See Surgeries Tab for more information regarding the procedure.   VAS US  CAROTID Result Date:  07/09/2024 Carotid Arterial Duplex Study Patient Name:  WANDA CELLUCCI  Date of Exam:   07/09/2024 Medical Rec #: [id number]          Accession #:    [id number] Date of Birth: [birth year]          Patient Gender: M Patient Age:   14 years Exam Location:  [facility] Procedure:      VAS US  CAROTID Referring Phys: LONNI GASKINS --------------------------------------------------------------------------------  Indications:       CVA, Weakness and CTA of the neck indicates significant                    plaque and >50% right ICA stenosis. Risk Factors:      Hypertension, hyperlipidemia, current smoker. Other Factors:     ETOH abuse, history of Hepititis C and liver carcinoma,                    status post chemotherapy  2019. Comparison Study:  No prior study on file Performing Technologist: Alberta Lis RVS  Examination Guidelines: A complete evaluation includes B-mode imaging, spectral Doppler, color Doppler, and power Doppler as needed of all accessible portions of each vessel. Bilateral testing is considered an integral part of a complete examination. Limited examinations for reoccurring indications may be performed as noted.  Right Carotid Findings: +----------+--------+--------+--------+------------------+------------------+           PSV cm/sEDV cm/sStenosisPlaque DescriptionComments           +----------+--------+--------+--------+------------------+------------------+ CCA Prox  91      13                                intimal thickening +----------+--------+--------+--------+------------------+------------------+ CCA Distal76      17              calcific                             +----------+--------+--------+--------+------------------+------------------+ ICA Prox  103     21              heterogenous      Shadowing          +----------+--------+--------+--------+------------------+------------------+ ICA Mid   66      21                                                   +----------+--------+--------+--------+------------------+------------------+ ICA Distal66      18                                                   +----------+--------+--------+--------+------------------+------------------+ ECA       69      13                                                   +----------+--------+--------+--------+------------------+------------------+ +----------+--------+-------+--------+-------------------+           PSV cm/sEDV cmsDescribeArm Pressure (mmHG) +----------+--------+-------+--------+-------------------+ [id number]                                         +----------+--------+-------+--------+-------------------+ +---------+--------+--+--------+--+  VertebralPSV cm/s53EDV cm/s15 +---------+--------+--+--------+--+  Left Carotid Findings: +----------+--------+--------+--------+------------------+------------------+           PSV cm/sEDV cm/sStenosisPlaque DescriptionComments           +----------+--------+--------+--------+------------------+------------------+ CCA Prox  118     16                                intimal thickening +----------+--------+--------+--------+------------------+------------------+ CCA Distal132     20                                intimal thickening +----------+--------+--------+--------+------------------+------------------+ ICA Prox  77      21      1-39%   calcific          Shadowing          +----------+--------+--------+--------+------------------+------------------+ ICA Mid   66      16                                                   +----------+--------+--------+--------+------------------+------------------+ ICA Distal70      24                                                   +----------+--------+--------+--------+------------------+------------------+ ECA       87      11                                                   +----------+--------+--------+--------+------------------+------------------+ +----------+--------+--------+--------+-------------------+           PSV cm/sEDV cm/sDescribeArm Pressure (mmHG) +----------+--------+--------+--------+-------------------+ [id number]                                         +----------+--------+--------+--------+-------------------+ +---------+--------+--+--------+--+ VertebralPSV cm/s56EDV cm/s16 +---------+--------+--+--------+--+   Summary:   *See table(s) above for measurements and observations.  Electronically signed by Debby Robertson on 07/09/2024 at 8:41:12 PM.    Final    MR BRAIN W WO CONTRAST Result Date: 07/08/2024 EXAM: MRI BRAIN WITH AND WITHOUT CONTRAST 07/08/2024 04:55:51 PM TECHNIQUE:  Multiplanar multisequence MRI of the head/brain was performed with and without the administration of 5.6 mL gadobutrol  (GADAVIST ) 1 MMOL/ML injection intravenous contrast. COMPARISON: None available. CLINICAL HISTORY: Neuro deficit, acute, stroke suspected; Stroke, follow up; Transient ischemic attack (TIA). FINDINGS: BRAIN AND VENTRICLES: Expected evolution of subacute nonhemorrhagic infarcts are noted within the posterior limb of the right internal capsule and right temporal lobe. Extensive T2 and FLAIR hyperintensity is present in the right temporal lobe. No associated enhancement is present with these infarcts. Extensive T2 hyperintensities are present again in the central pons. No acute intracranial hemorrhage. No mass effect or midline shift. No hydrocephalus. The sella is unremarkable. Normal flow voids. No other mass or abnormal enhancement. ORBITS: Bilateral lens replacements are noted. The globes and orbits are otherwise within normal limits. SINUSES: No acute abnormality. BONES AND SOFT TISSUES:  Normal bone marrow signal and enhancement. No acute soft tissue abnormality. IMPRESSION: 1. Expected evolution of subacute nonhemorrhagic infarcts in the posterior limb right internal capsule and right temporal lobe, with extensive T2 and FLAIR hyperintensity in the right temporal lobe without associated enhancement. No new or progressive infarct. 2. Extensive T2 hyperintensities in the central pons. Electronically signed by: Lonni Necessary MD 07/08/2024 06:43 PM EST RP Workstation: [id number]   EEG adult Result Date: 07/06/2024 Shelton Arlin KIDD, MD     07/06/2024  7:12 PM Patient Name: RAJON BISIG MRN: [id number] Epilepsy Attending: Arlin KIDD Shelton Referring Physician/Provider: Waddell Karna LABOR, NP Date: 07/06/2024 Duration: 28.02 mins Patient history: 73yo M wiith gait difficulty and leaning to 1 side with hallucinations. EEG to evaluate for seizure. Level of alertness: Awake AEDs during EEG study:  None Technical aspects: This EEG study was done with scalp electrodes positioned according to the 10-20 International system of electrode placement. Electrical activity was reviewed with band pass filter of 1-70Hz , sensitivity of 7 uV/mm, display speed of 58mm/sec with a 60Hz  notched filter applied as appropriate. EEG data were recorded continuously and digitally stored.  Video monitoring was available and reviewed as appropriate. Description: The posterior dominant rhythm consists of 7 Hz activity of moderate voltage (25-35 uV) seen predominantly in posterior head regions, symmetric and reactive to eye opening and eye closing. EEG showed continuous generalized and maximal left frontotemporal 3 to 6 Hz theta-delta slowing. Hyperventilation and photic stimulation were not performed.   ABNORMALITY - Continuous slow, generalized and maximal left frontotemporal IMPRESSION: This study is suggestive of cortical dysfunction arising from left frontotemporal region likely secondary to underlying structural abnormality. Additionally there is generalized cerebral dysfunction (encephalopathy). No seizures or epileptiform discharges were seen throughout the recording. Arlin KIDD Shelton   MR BRAIN WO CONTRAST Addendum Date: 07/06/2024 * ADDENDUM: Study discussed by telephone with Dr. Rosemarie at [phone] hours on 07/04/2024 - who advises that this patient does not have a typical stroke presentation, and does NOT have right-sided deficits as expected for right internal capsule region ischemia. Furthermore, the patient is undergoing cancer treatment for hepatocellular carcinoma. Patchy abnormal signal is present in the right hemisphere as well as in the pons. So rather than post-ischemic, these findings might be related to a para-neoplastic syndrome, or perhaps could be chemotherapy or immunotherapy related inflammation - if applicable. Infiltrative neoplasm (such as CNS lymphoma) was also discussed but currently I feel is unlikely. See  also postcontrast MRI brain findings reported on 07/05/2024. ---------------------------------------------------- Electronically signed by: Helayne Hurst MD 07/06/2024 10:07 AM EST RP Workstation: [id number]   Result Date: 07/06/2024 XAM: MRI BRAIN WITHOUT CONTRAST 07/04/2024 11:03:59 AM TECHNIQUE: Multiplanar multisequence MRI of the head/brain was performed without the administration of intravenous contrast. COMPARISON: Head CT this morning reported separately. CLINICAL HISTORY: 73 year old male. Vision loss left lower quadrant, headache x2 weeks. FINDINGS: BRAIN AND VENTRICLES: Patchy and indistinct abnormal diffusion in the posterior right basal ganglia, the posterior limb of the right internal capsule (series 9 image 28) and tracking posteriorly to the right periatrial and posterior right temporal lobe white matter (series 17 image 15) with mixed T2 shine through and isointense ADC in those areas. The DWI signal abnormality also tracks into the right cerebral peduncle on series 9 image 24, faintly restricted there. Associated T2 and FLAIR hyperintensity in these areas, also patchy and indistinct. No associated blood products or mass effect. Heterogeneous T2 shine through in the pons. No other diffusion restriction. Moderate T2 and FLAIR  hyperintensity in the pons outlining some of the transverse pontine fibers as seen on series 13 image 18. No pontine enlargement or blood products. This area appears facilitated on ADC. Small areas of subacute or chronic cortical encephalomalacia in the high right motor strip on series 13 image 42, also facilitated on ADC. No other cortical encephalomalacia identified. Otherwise scattered mild white matter T2 and FLAIR hyperintensity. No convincing chronic cerebral blood products on SWI. No midline shift. No hydrocephalus. The sella is unremarkable. Normal flow voids. ORBITS: Postoperative changes to both globes. SINUSES AND MASTOIDS: Scattered mild to moderate bilateral  paranasal sinus mucosal thickening. Mastoids are well aerated. BONES AND SOFT TISSUES: Normal background bone marrow signal. No acute soft tissue abnormality. Partially visible cervical spine degeneration. IMPRESSION: 1. Patchy and indistinct combined acute and subacute ischemia in the posterior Right internal capsule, adjacent deep gray nuclei and temporal lobe white matter. No hemorrhagic transformation or mass effect. 2. 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 3. 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 Electronically signed by: Helayne Hurst MD 07/04/2024 11:20 AM EST RP Workstation: [id number]   ECHOCARDIOGRAM COMPLETE Result Date: 07/05/2024    ECHOCARDIOGRAM REPORT   Patient Name:   DARON STUTZ Date of Exam: 07/05/2024 Medical Rec #:  [id number]         Height:       66.0 in Accession #:    [id number]        Weight:       115.0 lb Date of Birth:  1951-08-02         BSA:          1.581 m Patient Age:    73 years          BP:           118/78 mmHg Patient Gender: M                 HR:           58 bpm. Exam Location:  Inpatient Procedure: 2D Echo, Cardiac Doppler and Color Doppler (Both Spectral and Color            Flow Doppler were utilized during procedure). Indications:    Stroke I63.9  History:        Patient has no prior history of Echocardiogram examinations.                 Stroke, Signs/Symptoms:Chest Pain; Risk Factors:Hypertension and                 Current Smoker.  Sonographer:    Thea Norlander RCS Referring Phys: [id number] [facility] IMPRESSIONS  1. Left ventricular ejection fraction, by estimation, is 55 to 60%. The left ventricle has normal function. The left ventricle has no regional wall motion abnormalities. Left ventricular diastolic parameters were normal.  2. Right ventricular systolic function is normal. The right ventricular size is normal.  There is normal pulmonary artery systolic pressure. The estimated right ventricular systolic pressure is 30.1 mmHg.  3. The mitral valve is grossly normal. Trivial mitral valve regurgitation. No evidence of mitral stenosis.  4. The aortic valve is tricuspid. Aortic valve regurgitation is not visualized. No aortic stenosis is present.  5. The inferior vena cava is dilated in size with >50% respiratory variability, suggesting right atrial pressure of 8 mmHg. Conclusion(s)/Recommendation(s): No intracardiac source of embolism detected  on this transthoracic study. Consider a transesophageal echocardiogram to exclude cardiac source of embolism if clinically indicated. FINDINGS  Left Ventricle: Left ventricular ejection fraction, by estimation, is 55 to 60%. The left ventricle has normal function. The left ventricle has no regional wall motion abnormalities. The left ventricular internal cavity size was normal in size. There is  no left ventricular hypertrophy. Left ventricular diastolic parameters were normal. Right Ventricle: The right ventricular size is normal. No increase in right ventricular wall thickness. Right ventricular systolic function is normal. There is normal pulmonary artery systolic pressure. The tricuspid regurgitant velocity is 2.35 m/s, and  with an assumed right atrial pressure of 8 mmHg, the estimated right ventricular systolic pressure is 30.1 mmHg. Left Atrium: Left atrial size was normal in size. Right Atrium: Right atrial size was normal in size. Pericardium: There is no evidence of pericardial effusion. Mitral Valve: The mitral valve is grossly normal. Trivial mitral valve regurgitation. No evidence of mitral valve stenosis. Tricuspid Valve: The tricuspid valve is grossly normal. Tricuspid valve regurgitation is trivial. No evidence of tricuspid stenosis. Aortic Valve: The aortic valve is tricuspid. Aortic valve regurgitation is not visualized. No aortic stenosis is present. Aortic valve peak  gradient measures 2.9 mmHg. Pulmonic Valve: The pulmonic valve was grossly normal. Pulmonic valve regurgitation is trivial. No evidence of pulmonic stenosis. Aorta: The aortic root is normal in size and structure. Venous: The inferior vena cava is dilated in size with greater than 50% respiratory variability, suggesting right atrial pressure of 8 mmHg. IAS/Shunts: The atrial septum is grossly normal.  LEFT VENTRICLE PLAX 2D LVIDd:         4.30 cm     Diastology LVIDs:         2.60 cm     LV e' medial:    8.92 cm/s LV PW:         0.90 cm     LV E/e' medial:  6.3 LV IVS:        0.90 cm     LV e' lateral:   10.20 cm/s LVOT diam:     2.20 cm     LV E/e' lateral: 5.5 LV SV:         56 LV SV Index:   36 LVOT Area:     3.80 cm LV IVRT:       67 msec  LV Volumes (MOD) LV vol d, MOD A2C: 53.3 ml LV vol d, MOD A4C: 41.8 ml LV vol s, MOD A2C: 17.8 ml LV vol s, MOD A4C: 12.9 ml LV SV MOD A2C:     35.5 ml LV SV MOD A4C:     41.8 ml LV SV MOD BP:      32.4 ml RIGHT VENTRICLE             IVC RV S prime:     10.80 cm/s  IVC diam: 2.30 cm TAPSE (M-mode): 2.3 cm LEFT ATRIUM             Index        RIGHT ATRIUM           Index LA diam:        2.60 cm 1.64 cm/m   RA Area:     14.30 cm LA Vol (A2C):   24.5 ml 15.50 ml/m  RA Volume:   32.90 ml  20.81 ml/m LA Vol (A4C):   24.4 ml 15.44 ml/m LA Biplane Vol: 27.9 ml 17.65 ml/m  AORTIC VALVE AV  Area (Vmax): 3.20 cm AV Vmax:        85.10 cm/s AV Peak Grad:   2.9 mmHg LVOT Vmax:      71.60 cm/s LVOT Vmean:     44.600 cm/s LVOT VTI:       0.148 m  AORTA Ao Root diam: 3.20 cm MITRAL VALVE               TRICUSPID VALVE MV Area (PHT): 3.08 cm    TR Peak grad:   22.1 mmHg MV Decel Time: 246 msec    TR Vmax:        235.00 cm/s MV E velocity: 56.10 cm/s MV A velocity: 53.60 cm/s  SHUNTS MV E/A ratio:  1.05        Systemic VTI:  0.15 m                            Systemic Diam: 2.20 cm Darryle Decent MD Electronically signed by Darryle Decent MD Signature Date/Time: 07/05/2024/9:13:34 PM     Final    MR BRAIN W CONTRAST Result Date: 07/05/2024 EXAM: MRI BRAIN WITH CONTRAST 07/05/2024 12:55:39 PM TECHNIQUE: Multiplanar multisequence MRI of the head/brain was performed with the administration of intravenous contrast. COMPARISON: MR Head without contrast 07/04/2024. CLINICAL HISTORY: Possible PML or demyelinating disease. FINDINGS: No abnormal brain parenchymal or meningeal enhancement is identified, including within the region of signal abnormality involving the right temporal lobe and internal capsule/posterior basal ganglia region. Further assessment of the brain is deferred to yesterday's complete noncontrast MRI. IMPRESSION: 1. No abnormal intracranial enhancement. Electronically signed by: Dasie Hamburg MD 07/05/2024 03:29 PM EST RP Workstation: [id number]   CT ANGIO HEAD NECK W WO CM Result Date: 07/04/2024 CLINICAL DATA:  Neuro deficit, acute stroke suspected EXAM: CT ANGIOGRAPHY HEAD AND NECK WITH AND WITHOUT CONTRAST TECHNIQUE: Multidetector CT imaging of the head and neck was performed using the standard protocol during bolus administration of intravenous contrast. Multiplanar CT image reconstructions and MIPs were obtained to evaluate the vascular anatomy. Carotid stenosis measurements (when applicable) are obtained utilizing NASCET criteria, using the distal internal carotid diameter as the denominator. RADIATION DOSE REDUCTION: This exam was performed according to the departmental dose-optimization program which includes automated exposure control, adjustment of the mA and/or kV according to patient size and/or use of iterative reconstruction technique. CONTRAST:  75mL OMNIPAQUE  IOHEXOL  350 MG/ML SOLN COMPARISON:  Brain MRI July 04, 2024 CTA NECK: CTA NECK Aortic arch: No proximal vessel stenosis. Right carotid: There is plaque in the carotid bulb with less than 50% stenosis Left carotid: There is plaque in the carotid bulb with no significant stenosis Right vertebral: Patent without  significant abnormality Left vertebral: Patent without significant abnormality Soft tissues: No significant abnormality Other comments: There is emphysema CTA HEAD: CTA HEAD Right anterior circulation: The internal carotid artery is patent without significant stenosis. The anterior and middle cerebral arteries are patent without significant stenosis or proximal branch occlusion. No aneurysm. Left anterior circulation: The internal carotid artery is patent without significant stenosis. The anterior and middle cerebral arteries are patent without significant stenosis or proximal branch occlusion. No aneurysm. Posterior circulation: Both vertebral arteries are patent. There is no significant basilar stenosis. Both posterior cerebral arteries are patent without significant stenosis or proximal branch occlusion. No aneurysm. IMPRESSION: 1. Plaque in the right carotid bulb with less than 50% stenosis 2. Calcified plaque in the left carotid bulb without significant stenosis 3. No significant  vertebrobasilar or intracranial disease 4. Electronically Signed   By: Nancyann Burns M.D.   On: 07/04/2024 13:30   CT Head Wo Contrast Result Date: 07/04/2024 EXAM: CT HEAD WITHOUT CONTRAST 07/04/2024 09:54:32 AM TECHNIQUE: CT of the head was performed without the administration of intravenous contrast. Automated exposure control, iterative reconstruction, and/or weight based adjustment of the mA/kV was utilized to reduce the radiation dose to as low as reasonably achievable. COMPARISON: CT head 08/03/2018. CLINICAL HISTORY: vision loss, headache FINDINGS: BRAIN AND VENTRICLES: No acute hemorrhage. No evidence of acute infarct. No hydrocephalus. No extra-axial collection. No mass effect or midline shift. There is overall similar mild generalized parenchymal volume loss. Overall similar moderate scattered white matter hypodensities, which are nonspecific but most commonly represent chronic microvascular ischemic changes. ORBITS: Orbits  demonstrate bilateral lens replacement. SINUSES: Scattered opacification of the bilateral ethmoid air cells. SOFT TISSUES AND SKULL: No acute soft tissue abnormality. No skull fracture. Chronic left zygomatic arch deformity. IMPRESSION: 1. No acute intracranial hemorrhage, evidence of acute territorial infarction, or mass effect. 2. No substantial change additional chronic findings since 08/03/2018. Electronically signed by: prentice spade 07/04/2024 10:41 AM EST RP Workstation: [id number]    Microbiology: Results for orders placed or performed during the hospital encounter of 07/04/24  Surgical pcr screen     Status: Abnormal   Collection Time: 07/11/24  4:24 PM   Specimen: Nasal Mucosa; Nasal Swab  Result Value Ref Range Status   MRSA, PCR POSITIVE (A) NEGATIVE Final    Comment: RESULT CALLED TO, READ BACK BY AND VERIFIED WITH: RN ALONSO ROTUNDA [phone] @ [phone]    Staphylococcus aureus POSITIVE (A) NEGATIVE Final    Comment: (NOTE) The Xpert SA Assay (FDA approved for NASAL specimens in patients 10 years of age and older), is one component of a comprehensive surveillance program. It is not intended to diagnose infection nor to guide or monitor treatment. Performed at [facility], [address], [place], [place] [address]     Labs: CBC: Recent Labs  Lab 07/12/24 0252 07/13/24 0250  WBC 8.8 9.7  HGB 13.2 11.4*  HCT 40.8 34.5*  MCV 90.9 92.5  PLT 294 241   Basic Metabolic Panel: Recent Labs  Lab 07/12/24 0252 07/13/24 0250  NA 136 133*  K 4.4 4.1  CL 102 106  CO2 28 22  GLUCOSE 105* 121*  BUN 11 9  CREATININE 1.04 0.62  CALCIUM  9.6 8.4*  MG  --  2.0  PHOS  --  2.8   Liver Function Tests: Recent Labs  Lab 07/13/24 0250  AST 27  ALT 15  ALKPHOS 41  BILITOT 0.6  PROT 6.2*  ALBUMIN 3.1*   CBG: Recent Labs  Lab 07/08/24 1709 07/14/24 0840 07/14/24 1213 07/14/24 1639  GLUCAP 131* 91 83 87    Discharge time spent: greater than 30  minutes.  Signed: Sabas GORMAN Brod, MD [facility] 07/15/2024

## 2024-07-15 NOTE — Progress Notes (Addendum)
 Pt being discharged, VSS, IV removed, Stroke education regarding medication compliance and signs/symptoms of a stroke were reviewed.   Laneta JAYSON Rao, RN 07/15/2024 11:54 AM    Pt with standing is very unsteady on his feet before discharge, I brought mobility concerns up long with mentation concerns this morning with the MD Lama, he insisted the patient was Alert enough to go home and ok. Pt is a high fall risk and on blood thinners after a stroke. PT/OT recommended OP- PT/OT  Laneta JAYSON Rao, RN 07/15/2024 1:04 PM

## 2024-07-17 ENCOUNTER — Telehealth: Payer: Self-pay | Admitting: *Deleted

## 2024-07-17 NOTE — Transitions of Care (Post Inpatient/ED Visit) (Signed)
 07/17/2024  Name: Calvin Gardner MRN: [id number] DOB: 10-Dec-1950  Today's TOC FU Call Status: Today's TOC FU Call Status:: Successful TOC FU Call Completed TOC FU Call Complete Date: 07/17/24  Patient's Name and Date of Birth confirmed. Name, DOB  Transition Care Management Follow-up Telephone Call Date of Discharge: 07/15/24 Discharge Facility: [facility]) Type of Discharge: Inpatient Admission Primary Inpatient Discharge Diagnosis:: Acute cerebrovascular accident How have you been since you were released from the hospital?: Better Any questions or concerns?: No  Items Reviewed: Did you receive and understand the discharge instructions provided?: Yes Medications obtained,verified, and reconciled?: Yes (Medications Reviewed) Any new allergies since your discharge?: No Dietary orders reviewed?: Yes Type of Diet Ordered:: Heart Healthy Do you have support at home?: Yes People in Home [RPT]: sibling(s) Name of Support/Comfort Primary Source: Brother/Charles and SIL/Pan  Medications Reviewed Today: Medications Reviewed Today     Reviewed by Lucky Andrea LABOR, RN (Registered Nurse) on 07/17/24 at 1127  Med List Status: <None>   Medication Order Taking? Sig Documenting Provider Last Dose Status Informant  acetaminophen  (TYLENOL ) 500 MG tablet [id number] Yes Take 2 tablets (1,000 mg total) by mouth in the morning. Ezenduka, Nkeiruka J, MD  Active   amLODipine  (NORVASC ) 5 MG tablet [id number] Yes Take 1 tablet (5 mg total) by mouth daily. Drusilla Sabas RAMAN, MD  Active   aspirin  81 MG chewable tablet [id number] Yes Chew 1 tablet (81 mg total) by mouth daily. Drusilla Sabas RAMAN, MD  Active   atorvastatin  (LIPITOR) 20 MG tablet [id number] Yes Take 1 tablet (20 mg total) by mouth daily. Drusilla Sabas RAMAN, MD  Active   clopidogrel  (PLAVIX ) 75 MG tablet [id number] Yes Take 1 tablet by mouth daily Drusilla Sabas RAMAN, MD  Active   folic acid  (FOLVITE ) 1 MG tablet [id number] Yes Take 1 tablet (1 mg total) by  mouth daily. Drusilla Sabas RAMAN, MD  Active   Multiple Vitamin (MULTIVITAMIN WITH MINERALS) TABS tablet [id number] Yes Take 1 tablet by mouth daily. Drusilla Sabas RAMAN, MD  Active   thiamine  (VITAMIN B1) 100 MG tablet [id number] Yes Take 1 tablet (100 mg total) by mouth daily. Drusilla Sabas RAMAN, MD  Active             Home Care and Equipment/Supplies: Were Home Health Services Ordered?: No Any new equipment or medical supplies ordered?: Yes Name of Medical supply agency?: [employer] for rolling walker Were you able to get the equipment/medical supplies?: Yes Do you have any questions related to the use of the equipment/supplies?: No  Functional Questionnaire: Do you need assistance with bathing/showering or dressing?: No Do you need assistance with meal preparation?: Yes Do you need assistance with eating?: No Do you have difficulty maintaining continence: No Do you need assistance with getting out of bed/getting out of a chair/moving?: No Do you have difficulty managing or taking your medications?: Yes (Brother and SIL assists)  Follow up appointments reviewed: PCP Follow-up appointment confirmed?: No (RNCM assisted with scheduling on 08/10/23-first available) MD Provider Line [phone] Given: No Specialist Hospital Follow-up appointment confirmed?: Yes Date of Specialist follow-up appointment?: 08/16/23 Follow-Up Specialty Provider:: [facility] Do you need transportation to your follow-up appointment?: No Do you understand care options if your condition(s) worsen?: Yes-patient verbalized understanding  SDOH Interventions Today    Flowsheet Row Most Recent Value  SDOH Interventions   Food Insecurity Interventions Intervention Not Indicated  Housing Interventions Intervention Not Indicated  Transportation Interventions Intervention Not Indicated  Utilities Interventions Intervention Not  Indicated    Goals Addressed             This Visit's Progress    VBCI Transitions of Care (TOC)  Care Plan       Problems:  Recent Hospitalization for treatment of CVA Knowledge Deficit Related to Medications and No Hospital Follow Up Provider appointment RNCM will assist with scheduling  Goal:  Over the next 30 days, the patient will not experience hospital readmission  Interventions:  Transitions of Care: Durable Medical Equipment (DME) reviewed with patient/caregiver Doctor Visits  - discussed the importance of doctor visits Post discharge activity limitations prescribed by provider reviewed SDOH assessment Medication reviewed, spoke with sister in law who manages patients medications, verified patient has medications and taking as directed Bleeding precautions reviewed Advised purchasing a BP monitor and begin checking BP daily Assisted with scheduling hospital follow up with PCP on 08/10/23-first available Fall precautions reviewed Advised patient to call and schedule with Neurology stroke clinic [phone] Advised calling to schedule with Outpatient Rehab [phone]  Patient Self Care Activities:  Attend all scheduled provider appointments Call pharmacy for medication refills 3-7 days in advance of running out of medications Call provider office for new concerns or questions  Notify RN Care Manager of TOC call rescheduling needs Participate in Transition of Care Program/Attend TOC scheduled calls Take medications as prescribed   Call and schedule with Outpatient Rehab and Neurology Stroke Clinic  Plan:  Telephone follow up appointment with care management team member scheduled for:  07/25/24 at 10am       Discussed and offered 30 day TOC program.  Patient       enrolled .  The patient has been provided with contact information for the care management team and has been advised to call with any health -related questions or concerns.  The patient verbalized understanding with current plan of care.  The patient is directed to their insurance card regarding availability  of benefits coverage.    Andrea Dimes RN, BSN [place]  [facility] Health RN Care Manager [phone]

## 2024-07-19 ENCOUNTER — Telehealth: Payer: Self-pay

## 2024-07-19 NOTE — Progress Notes (Signed)
..  Complex Care Management   07/19/2024  Name: REAKWON BARREN  MRN: [id number]  DOB: 04/09/1951  Inbound call received from Vinie LITTIE Poncho after receiving an Interactive Voice Response (IVR) call after a recent hospitalization. Information was provided about Care Management services.  Follow up plan: No further follow up required, Mr. Griffith has enrolled in the VBCI TOC 30 day program so he will be getting a call from Andrea Dimes on 07/25/24.   Leita Lyme, BLANCH CAULK [place]  [facility], [facility] Health Care Management Assistant [phone]

## 2024-07-25 ENCOUNTER — Other Ambulatory Visit: Admitting: *Deleted

## 2024-07-25 NOTE — Patient Instructions (Signed)
 Visit Information  Thank you for taking time to visit with me today. Please don't hesitate to contact me if I can be of assistance to you before our next scheduled telephone appointment.   Following is a copy of your care plan:   Goals Addressed             This Visit's Progress    VBCI Transitions of Care (TOC) Care Plan       Problems:  Recent Hospitalization for treatment of CVA Knowledge Deficit Related to Medications and No Hospital Follow Up Provider appointment RNCM will assist with scheduling  Goal:  Over the next 30 days, the patient will not experience hospital readmission  Interventions:  Transitions of Care: Doctor Visits  - discussed the importance of doctor visits Post discharge activity limitations prescribed by provider reviewed Medication reviewed, spoke with sister in law who manages patients medications, verified patient has medications and taking as directed Bleeding precautions reviewed Advised purchasing a BP monitor and begin checking BP daily-revisited Reviewed upcoming appointments including: PCP on 07/26/24, Neurology on 09/27/24 Fall precautions reviewed Assisted with scheduling with Outpatient Rehab PT/OT on 08/08/24 Diet reviewed  Patient Self Care Activities:  Attend all scheduled provider appointments Call pharmacy for medication refills 3-7 days in advance of running out of medications Call provider office for new concerns or questions  Notify RN Care Manager of TOC call rescheduling needs Participate in Transition of Care Program/Attend TOC scheduled calls Take medications as prescribed   Call and schedule with Outpatient Rehab and Neurology Stroke Clinic  Plan:  Telephone follow up appointment with care management team member scheduled for:  08/07/24 at 3pm        Patient verbalizes understanding of instructions and care plan provided today and agrees to view in MyChart. Active MyChart status and patient understanding of how to access  instructions and care plan via MyChart confirmed with patient.     Telephone follow up appointment with care management team member scheduled for:08/07/24 at 3pm  Please call the care guide team at [phone] if you need to cancel or reschedule your appointment.   Please call [phone]-TALK (toll free, 24 hour hotline) go to [facility]gent [facility] [address], [place] [phone]) call 911 if you are experiencing a Mental Health or Behavioral Health Crisis or need someone to talk to.  Andrea Dimes RN, BSN [place]  [facility] Health RN Care Manager [phone]

## 2024-07-25 NOTE — Transitions of Care (Post Inpatient/ED Visit) (Signed)
 " Transition of Care week 2  Visit Note  07/25/2024  Name: Calvin Gardner MRN: [id number]          DOB: 02/20/1951  Situation: Patient enrolled in [facility] 30-day program. Visit completed with Mr. [name] by telephone.   Background:   Initial Transition Care Management Follow-up Telephone Call Discharge Date and Diagnosis: 07/15/24, Acute cerebrovascular accident   Past Medical History:  Diagnosis Date   Alcohol abuse 10/07/2017   Alcoholism (HCC)    Aortic atherosclerosis    Arthritis    Cancer (HCC)    liver   Cataract    Chronic hepatitis C without hepatic coma (HCC) 09/19/2014   GERD (gastroesophageal reflux disease)    Hepatitis C    Hepatocellular carcinoma (HCC) 06/09/2018   HLD (hyperlipidemia)    Hypertension    Liver mass    Pulmonary nodule    Substance abuse (HCC)     Assessment: Patient Reported Symptoms: Cognitive Cognitive Status: Able to follow simple commands, Alert and oriented to person, place, and time, Normal speech and language skills      Neurological Neurological Review of Symptoms: Headaches Neurological Management Strategies: Medication therapy, Coping strategies Neurological Self-Management Outcome: 4 (good) Neurological Comment: Patient reports occasionally wakes up with a headache. Headache is relieved by taking tylenol .  HEENT HEENT Symptoms Reported: No symptoms reported      Cardiovascular Cardiovascular Symptoms Reported: No symptoms reported Cardiovascular Management Strategies: Medication therapy, Routine screening, Diet modification Cardiovascular Self-Management Outcome: 4 (good)  Respiratory Respiratory Symptoms Reported: No symptoms reported    Endocrine Endocrine Symptoms Reported: No symptoms reported    Gastrointestinal Gastrointestinal Symptoms Reported: No symptoms reported Additional Gastrointestinal Details: LBM 07/25/24      Genitourinary Genitourinary Symptoms Reported: No symptoms reported    Integumentary  Integumentary Symptoms Reported: No symptoms reported    Musculoskeletal Musculoskelatal Symptoms Reviewed: Weakness Additional Musculoskeletal Details: ambulating with RW Musculoskeletal Management Strategies: Routine screening, Medical device, Coping strategies, Adequate rest Musculoskeletal Self-Management Outcome: 3 (uncertain) Musculoskeletal Comment: Patient has not been contacted to schedule outpatient PT. RNCM assisted with scheduling PT/OT with Neuro Rehab.      Psychosocial Psychosocial Symptoms Reported: Not assessed         There were no vitals filed for this visit. Pain Score: 0-No pain  Medications Reviewed Today     Reviewed by Lucky Andrea LABOR, RN (Registered Nurse) on 07/25/24 at 1032  Med List Status: <None>   Medication Order Taking? Sig Documenting Provider Last Dose Status Informant  acetaminophen  (TYLENOL ) 500 MG tablet [id number] Yes Take 2 tablets (1,000 mg total) by mouth in the morning. Ezenduka, Nkeiruka J, MD  Active   amLODipine  (NORVASC ) 5 MG tablet [id number] Yes Take 1 tablet (5 mg total) by mouth daily. Drusilla Sabas RAMAN, MD  Active   aspirin  81 MG chewable tablet [id number] Yes Chew 1 tablet (81 mg total) by mouth daily. Drusilla Sabas RAMAN, MD  Active   atorvastatin  (LIPITOR) 20 MG tablet [id number] Yes Take 1 tablet (20 mg total) by mouth daily. Drusilla Sabas RAMAN, MD  Active   clopidogrel  (PLAVIX ) 75 MG tablet [id number] Yes Take 1 tablet by mouth daily Drusilla Sabas RAMAN, MD  Active   folic acid  (FOLVITE ) 1 MG tablet [id number] Yes Take 1 tablet (1 mg total) by mouth daily. Drusilla Sabas RAMAN, MD  Active   Multiple Vitamin (MULTIVITAMIN WITH MINERALS) TABS tablet [id number] Yes Take 1 tablet by mouth daily. Drusilla Sabas RAMAN, MD  Active   thiamine  (VITAMIN  B1) 100 MG tablet [id number] Yes Take 1 tablet (100 mg total) by mouth daily. Drusilla Sabas RAMAN, MD  Active             Recommendation:   Continue Current Plan of Care  Follow Up Plan:   Telephone follow-up in 1 week  Andrea Dimes RN, BSN [place]  [facility] Health RN Care Manager [phone]     "

## 2024-07-26 ENCOUNTER — Encounter: Payer: Self-pay | Admitting: *Deleted

## 2024-07-26 ENCOUNTER — Other Ambulatory Visit: Payer: Self-pay

## 2024-07-26 ENCOUNTER — Ambulatory Visit: Attending: *Deleted | Admitting: *Deleted

## 2024-07-26 VITALS — BP 136/75 | HR 79 | Temp 98.2°F | Ht 66.0 in | Wt 120.8 lb

## 2024-07-26 DIAGNOSIS — E7800 Pure hypercholesterolemia, unspecified: Secondary | ICD-10-CM

## 2024-07-26 DIAGNOSIS — I10 Essential (primary) hypertension: Secondary | ICD-10-CM

## 2024-07-26 DIAGNOSIS — R911 Solitary pulmonary nodule: Secondary | ICD-10-CM

## 2024-07-26 DIAGNOSIS — R7309 Other abnormal glucose: Secondary | ICD-10-CM

## 2024-07-26 DIAGNOSIS — D528 Other folate deficiency anemias: Secondary | ICD-10-CM

## 2024-07-26 DIAGNOSIS — Z8673 Personal history of transient ischemic attack (TIA), and cerebral infarction without residual deficits: Secondary | ICD-10-CM

## 2024-07-26 LAB — POCT GLYCOSYLATED HEMOGLOBIN (HGB A1C): HbA1c, POC (controlled diabetic range): 5.6 % (ref 0.0–7.0)

## 2024-07-26 MED ORDER — FOLIC ACID 1 MG PO TABS
1.0000 mg | ORAL_TABLET | Freq: Every day | ORAL | 1 refills | Status: AC
  Filled 2024-07-26 – 2024-08-21 (×2): qty 90, 90d supply, fill #0

## 2024-07-26 MED ORDER — ASPIRIN 81 MG PO CHEW
81.0000 mg | CHEWABLE_TABLET | Freq: Every day | ORAL | 1 refills | Status: AC
  Filled 2024-07-26: qty 90, 90d supply, fill #0

## 2024-07-26 MED ORDER — ATORVASTATIN CALCIUM 20 MG PO TABS
20.0000 mg | ORAL_TABLET | Freq: Every day | ORAL | 1 refills | Status: AC
  Filled 2024-07-26: qty 90, 90d supply, fill #0

## 2024-07-26 MED ORDER — CLOPIDOGREL BISULFATE 75 MG PO TABS
ORAL_TABLET | ORAL | 1 refills | Status: AC
  Filled 2024-07-26: qty 28, fill #0

## 2024-07-26 MED ORDER — ACETAMINOPHEN 500 MG PO TABS
1000.0000 mg | ORAL_TABLET | Freq: Every morning | ORAL | 1 refills | Status: AC
  Filled 2024-07-26 – 2024-08-15 (×5): qty 30, 15d supply, fill #0

## 2024-07-26 MED ORDER — AMLODIPINE BESYLATE 5 MG PO TABS
5.0000 mg | ORAL_TABLET | Freq: Every day | ORAL | 1 refills | Status: AC
  Filled 2024-07-26: qty 90, 90d supply, fill #0

## 2024-07-26 MED ORDER — ADULT MULTIVITAMIN W/MINERALS CH
1.0000 | ORAL_TABLET | Freq: Every day | ORAL | 1 refills | Status: AC
  Filled 2024-07-26 – 2024-08-21 (×2): qty 90, 90d supply, fill #0

## 2024-07-26 MED ORDER — THIAMINE HCL 100 MG PO TABS
100.0000 mg | ORAL_TABLET | Freq: Every day | ORAL | 1 refills | Status: AC
  Filled 2024-07-26 – 2024-08-21 (×2): qty 90, 90d supply, fill #0

## 2024-07-26 NOTE — Progress Notes (Signed)
 "   Patient ID: Calvin Gardner, male    DOB: 03/04/1951  MRN: [id number]  CC: Hospitalization Follow-up   Subjective: Calvin Gardner is a 73 y.o. male who presents for hospital follow-up status post CVA.  He denies any symptoms of concern including chest pain, palpitations, shortness of breath, or symptoms into an extremity that to him would suggest a stroke.  He is more concerned with getting help to renew his food stamps and issues with his bank card.  (Friend accompanying him today)  Impaired glucose: His blood sugar during his hospitalization was 121.  He denies any symptoms of concern including polyuria, polydipsia or polyphagia. He admits that he does not adhere to a low carbohydrate diet or exercise daily  History of folate deficiency: H&H (11.4/34.5) were reviewed during this clinic visit.  He has been prescribed folic acid  and thiamine  in the past.  He denies any blood in vomitus or stool. Has not been taking his folate and thiamine  as previously ordered  At discharge he was referred to PCP to manage risk factors including blood pressure, cholesterol, impaired glucose, order pulmonary referral for follow-up pulmonary nodule and neurology for PT/OT, speech therapy  Per chart review most recent glucose was 121 on 07/14/2024.  Lipid panel cholesterol 135, triglyceride 54, HDL 68, LDL 56.  H&H  His concerns today include:  Essential hypertension, benign, tobacco abuse, impaired fasting glucose, alcohol use disorder, GERD, pulmonary nodule    Patient Active Problem List   Diagnosis Date Noted   Acute cerebrovascular accident (CVA) (HCC) 07/04/2024   Helicobacter pylori infection 10/14/2023   Hepatocellular carcinoma (HCC) 06/09/2018   Pulmonary nodule 05/25/2018   GERD (gastroesophageal reflux disease) 01/10/2018   Hiccough 10/07/2017   Alcohol abuse 10/07/2017   Chest pain 10/29/2015   Liver fibrosis (HCC) 10/23/2014   Chronic hepatitis C without hepatic coma (HCC)  09/19/2014   IFG (impaired fasting glucose) 04/02/2014   Vitamin D  deficiency 04/02/2014   Abnormal LFTs 04/02/2014   Cataract, right eye 01/05/2014   Essential hypertension, benign 01/05/2014   Tobacco abuse 01/05/2014     Medications Ordered Prior to Encounter[1]  Allergies[2]  Social History   Socioeconomic History   Marital status: Single    Spouse name: Not on file   Number of children: 1   Years of education: Not on file   Highest education level: Not on file  Occupational History   Occupation: works in quarry manager    Occupation: Retired  Tobacco Use   Smoking status: Some Days    Current packs/day: 1.00    Average packs/day: 1 pack/day for 50.0 years (50.0 ttl pk-yrs)    Types: Cigarettes   Smokeless tobacco: Never   Tobacco comments:    trying to quit, smoke every other day  Vaping Use   Vaping status: Never Used  Substance and Sexual Activity   Alcohol use: Yes    Alcohol/week: 0.0 standard drinks of alcohol    Comment: 2-3 40 oz beers per day, x50 years   Drug use: No    Comment: remote IV drug use age 25    Sexual activity: Yes    Partners: Female, Male    Comment: patient declines  Other Topics Concern   Not on file  Social History Narrative   Not on file   Social Drivers of Health   Tobacco Use: High Risk (07/26/2024)   Patient History    Smoking Tobacco Use: Some Days    Smokeless Tobacco Use: Never  Passive Exposure: Not on file  Financial Resource Strain: Low Risk (04/27/2023)   Overall Financial Resource Strain (CARDIA)    Difficulty of Paying Living Expenses: Not hard at all  Food Insecurity: No Food Insecurity (07/17/2024)   Epic    Worried About Programme Researcher, Broadcasting/film/video in the Last Year: Never true    Ran Out of Food in the Last Year: Never true  Transportation Needs: No Transportation Needs (07/17/2024)   Epic    Lack of Transportation (Medical): No    Lack of Transportation (Non-Medical): No  Physical Activity: Sufficiently Active  (04/27/2023)   Exercise Vital Sign    Days of Exercise per Week: 7 days    Minutes of Exercise per Session: 30 min  Stress: No Stress Concern Present (04/27/2023)   [employer] of Occupational Health - Occupational Stress Questionnaire    Feeling of Stress : Not at all  Social Connections: Socially Isolated (07/06/2024)   Social Connection and Isolation Panel    Frequency of Communication with Friends and Family: More than three times a week    Frequency of Social Gatherings with Friends and Family: More than three times a week    Attends Religious Services: Never    Database Administrator or Organizations: No    Attends Banker Meetings: Never    Marital Status: Divorced  Catering Manager Violence: Not At Risk (07/17/2024)   Epic    Fear of Current or Ex-Partner: No    Emotionally Abused: No    Physically Abused: No    Sexually Abused: No  Depression (PHQ2-9): Low Risk (07/17/2024)   Depression (PHQ2-9)    PHQ-2 Score: 0  Alcohol Screen: Low Risk (04/27/2023)   Alcohol Screen    Last Alcohol Screening Score (AUDIT): 5  Housing: Unknown (07/17/2024)   Epic    Unable to Pay for Housing in the Last Year: No    Number of Times Moved in the Last Year: Not on file    Homeless in the Last Year: No  Utilities: Not At Risk (07/17/2024)   Epic    Threatened with loss of utilities: No  Health Literacy: Adequate Health Literacy (04/27/2023)   B1300 Health Literacy    Frequency of need for help with medical instructions: Never    Family History  Problem Relation Age of Onset   Cancer Mother        type unknown   Emphysema Brother    Colon cancer Neg Hx    Esophageal cancer Neg Hx    Pancreatic cancer Neg Hx    Rectal cancer Neg Hx    Stomach cancer Neg Hx    Colon polyps Neg Hx     Past Surgical History:  Procedure Laterality Date   COLONOSCOPY WITH PROPOFOL  N/A 01/30/2014   Procedure: COLONOSCOPY WITH PROPOFOL ;  Surgeon: Gladis MARLA Louder, MD;  Location: [facility]  ENDOSCOPY;  Service: Endoscopy;  Laterality: N/A;   ESOPHAGOGASTRODUODENOSCOPY (EGD) WITH PROPOFOL  N/A 01/30/2014   Procedure: ESOPHAGOGASTRODUODENOSCOPY (EGD) WITH PROPOFOL ;  Surgeon: Gladis MARLA Louder, MD;  Location: [facility] ENDOSCOPY;  Service: Endoscopy;  Laterality: N/A;   IR ANGIOGRAM SELECTIVE EACH ADDITIONAL VESSEL  07/19/2018   IR ANGIOGRAM SELECTIVE EACH ADDITIONAL VESSEL  07/19/2018   IR ANGIOGRAM SELECTIVE EACH ADDITIONAL VESSEL  07/19/2018   IR ANGIOGRAM SELECTIVE EACH ADDITIONAL VESSEL  07/19/2018   IR ANGIOGRAM SELECTIVE EACH ADDITIONAL VESSEL  07/19/2018   IR ANGIOGRAM VISCERAL SELECTIVE  07/19/2018   IR EMBO TUMOR ORGAN ISCHEMIA INFARCT INC  GUIDE ROADMAPPING  07/19/2018   IR RADIOLOGIST EVAL & MGMT  07/07/2018   IR RADIOLOGIST EVAL & MGMT  08/16/2018   IR RADIOLOGIST EVAL & MGMT  01/31/2019   IR RADIOLOGIST EVAL & MGMT  04/25/2019   IR RADIOLOGIST EVAL & MGMT  07/20/2019   IR RADIOLOGIST EVAL & MGMT  10/17/2019   IR RADIOLOGIST EVAL & MGMT  01/30/2020   IR RADIOLOGIST EVAL & MGMT  08/14/2020   IR RADIOLOGIST EVAL & MGMT  02/24/2022   IR RADIOLOGIST EVAL & MGMT  08/11/2022   IR RADIOLOGIST EVAL & MGMT  04/27/2023   IR RADIOLOGIST EVAL & MGMT  10/28/2023   IR US  GUIDE VASC ACCESS LEFT  07/19/2018   left jaw surgery      MANDIBLE FRACTURE SURGERY     TRANSCAROTID ARTERY REVASCULARIZATION  Right 07/12/2024   Procedure: RIGHT TRANSCAROTID ARTERY REVASCULARIZATION;  Surgeon: Gretta Lonni PARAS, MD;  Location: [facility] OR;  Service: Vascular;  Laterality: Right;   ULTRASOUND GUIDANCE FOR VASCULAR ACCESS Left 07/12/2024   Procedure: ULTRASOUND GUIDANCE, FOR VASCULAR ACCESS;  Surgeon: Gretta Lonni PARAS, MD;  Location: [facility] OR;  Service: Vascular;  Laterality: Left;    ROS: Review of Systems Negative except as stated above  PHYSICAL EXAM: BP 136/75   Pulse 79   Temp 98.2 F (36.8 C) (Oral)   Ht 5' 6 (1.676 m)   Wt 120 lb 12.8 oz (54.8 kg)   SpO2 100%   BMI 19.50 kg/m   Physical  Exam Vitals and nursing note reviewed.  Constitutional:      Appearance: Normal appearance.  HENT:     Mouth/Throat:     Mouth: Mucous membranes are moist.     Pharynx: Oropharynx is clear.  Cardiovascular:     Rate and Rhythm: Normal rate and regular rhythm.  Pulmonary:     Effort: Pulmonary effort is normal.     Breath sounds: Normal breath sounds.  Abdominal:     Comments: No masses or organomegaly  Musculoskeletal:     Comments: Moves all extremities x 4. He does have his walker with him for safe ambulation  Skin:    General: Skin is warm and dry.  Neurological:     Mental Status: He is alert and oriented to person, place, and time. Mental status is at baseline.     Gait: Gait normal.     Comments: Cranial nerves II through XII intact          Latest Ref Rng & Units 07/13/2024    2:50 AM 07/12/2024    2:52 AM 07/05/2024    4:55 AM  CMP  Glucose 70 - 99 mg/dL 878  894  894   BUN 8 - 23 mg/dL 9  11  6    Creatinine 0.61 - 1.24 mg/dL 9.37  8.95  9.22   Sodium 135 - 145 mmol/L 133  136  138   Potassium 3.5 - 5.1 mmol/L 4.1  4.4  4.0   Chloride 98 - 111 mmol/L 106  102  102   CO2 22 - 32 mmol/L 22  28  27    Calcium  8.9 - 10.3 mg/dL 8.4  9.6  9.7   Total Protein 6.5 - 8.1 g/dL 6.2   6.4   Total Bilirubin 0.0 - 1.2 mg/dL 0.6   0.7   Alkaline Phos 38 - 126 U/L 41   39   AST 15 - 41 U/L 27   22   ALT 0 - 44  U/L 15   12    Lipid Panel     Component Value Date/Time   CHOL 135 07/05/2024 0455   CHOL 138 04/13/2024 1016   TRIG 54 07/05/2024 0455   HDL 68 07/05/2024 0455   HDL 69 04/13/2024 1016   CHOLHDL 2.0 07/05/2024 0455   VLDL 11 07/05/2024 0455   LDLCALC 56 07/05/2024 0455   LDLCALC 55 04/13/2024 1016    CBC    Component Value Date/Time   WBC 9.7 07/13/2024 0250   RBC 3.73 (L) 07/13/2024 0250   HGB 11.4 (L) 07/13/2024 0250   HGB 13.5 06/12/2019 1156   HGB 13.0 05/16/2018 1219   HCT 34.5 (L) 07/13/2024 0250   HCT 39.1 05/16/2018 1219   PLT 241  07/13/2024 0250   PLT 266 06/12/2019 1156   PLT 199 05/16/2018 1219   MCV 92.5 07/13/2024 0250   MCV 91 05/16/2018 1219   MCH 30.6 07/13/2024 0250   MCHC 33.0 07/13/2024 0250   RDW 12.3 07/13/2024 0250   RDW 12.1 (L) 05/16/2018 1219   LYMPHSABS 1.6 07/04/2024 1045   LYMPHSABS 1.0 05/16/2018 1219   MONOABS 1.0 07/04/2024 1045   EOSABS 0.3 07/04/2024 1045   EOSABS 0.0 05/16/2018 1219   BASOSABS 0.1 07/04/2024 1045   BASOSABS 0.1 05/16/2018 1219    Results for orders placed or performed in visit on 07/26/24  HgB A1c   Collection Time: 07/26/24 12:04 PM  Result Value Ref Range   Hemoglobin A1C     HbA1c POC (<> result, manual entry)     HbA1c, POC (prediabetic range)     HbA1c, POC (controlled diabetic range) 5.6 0.0 - 7.0 %     ASSESSMENT AND PLAN:  Assessment & Plan Pulmonary nodule History of pulmonary nodule: Without symptom of concern.  Referred  to pulmonary for further evaluation  Orders:   Ambulatory referral to Pulmonology  Impaired glucose regulation Impaired glucose: He has blood sugar during his hospitalization was 121.  Without symptom of concern or endocrine red flag  Will check A1c POC. The result was 5.6 and he was notified by me  Orders:   HgB A1c  Status post CVA status post CVA: Without symptoms of concern or neurologic red flag He is encouraged to keep his follow-up with neurology and physical therapy as well as other appointments that are on his discharge summary Friend came with him today that we will assist him   Other folate deficiency anemias  History of folate deficiency: H&H (11.4/34.5)  Prescribed folic acid  and thiamine  in the past.  Denies any blood in vomitus or stool. Admits he was not taking his folate and thiamine  as previously ordered. He plans to start taking his medication again and will notify us  if he has any sign of blood loss or symptom of concern         Patient was given the opportunity to ask questions.  Patient  verbalized understanding of the plan and was able to repeat key elements of the plan.   This documentation was completed using Paediatric nurse.  Any transcriptional errors are unintentional.     Requested Prescriptions    No prescriptions requested or ordered in this encounter    Return in about 6 months (around 01/24/2025) for with PCP.  Morgen Linebaugh H, NP     [1]  Current Outpatient Medications on File Prior to Visit  Medication Sig Dispense Refill   acetaminophen  (TYLENOL ) 500 MG tablet Take 2 tablets (1,000 mg  total) by mouth in the morning.     amLODipine  (NORVASC ) 5 MG tablet Take 1 tablet (5 mg total) by mouth daily. 90 tablet 1   aspirin  81 MG chewable tablet Chew 1 tablet (81 mg total) by mouth daily. 90 tablet 0   atorvastatin  (LIPITOR) 20 MG tablet Take 1 tablet (20 mg total) by mouth daily. 90 tablet 1   clopidogrel  (PLAVIX ) 75 MG tablet Take 1 tablet by mouth daily 90 tablet 0   folic acid  (FOLVITE ) 1 MG tablet Take 1 tablet (1 mg total) by mouth daily. 30 tablet 0   Multiple Vitamin (MULTIVITAMIN WITH MINERALS) TABS tablet Take 1 tablet by mouth daily. 30 tablet 0   thiamine  (VITAMIN B1) 100 MG tablet Take 1 tablet (100 mg total) by mouth daily. 30 tablet 0   No current facility-administered medications on file prior to visit.  [2] No Known Allergies  "

## 2024-07-26 NOTE — Assessment & Plan Note (Signed)
 History of pulmonary nodule: Without symptom of concern.  Referred  to pulmonary for further evaluation  Orders:   Ambulatory referral to Pulmonology

## 2024-07-26 NOTE — Patient Instructions (Signed)
 So glad to see that you were accompanied by your friend today We discussed your recent stroke and how well you are doing following that. Continue to use your walker for  safe ambulation  keep your physical therapy appointment on January 6. We reviewed your blood pressure, cholesterol and blood sugar today and found everything to be in stable condition. I will make sure that she has 6 months worth of refills available at the: Outpatient pharmacy Remember to make the appointments that were mentioned on your discharge summary Let your friend help you with those and reach out to the office if you need any assistance otherwise. I will refer you to pulmonology for evaluation of the pulmonary nodule

## 2024-07-31 ENCOUNTER — Other Ambulatory Visit (HOSPITAL_COMMUNITY): Payer: Self-pay

## 2024-07-31 ENCOUNTER — Other Ambulatory Visit: Payer: Self-pay

## 2024-08-01 ENCOUNTER — Other Ambulatory Visit: Payer: Self-pay

## 2024-08-02 ENCOUNTER — Other Ambulatory Visit (HOSPITAL_COMMUNITY): Payer: Self-pay

## 2024-08-04 NOTE — Therapy (Signed)
 " OUTPATIENT PHYSICAL THERAPY NEURO EVALUATION   Patient Name: Calvin Gardner MRN: [id number] DOB:1951-03-09, 74 y.o., male Today's Date: 08/08/2024   PCP: Delbert Clam, MD REFERRING PROVIDER: Donnamarie Lebron PARAS, MD  END OF SESSION:  PT End of Session - 08/08/24 1236     Visit Number 1    Number of Visits 9   8 visits plus Eval   Date for Recertification  10/17/24   for scheduling delays   Authorization Type [employer] Dual    PT Start Time 1231    PT Stop Time 1315    PT Time Calculation (min) 44 min    Equipment Utilized During Treatment Gait belt    Activity Tolerance Patient tolerated treatment well    Behavior During Therapy [facility] for tasks assessed/performed          Past Medical History:  Diagnosis Date   Alcohol abuse 10/07/2017   Alcoholism (HCC)    Aortic atherosclerosis    Arthritis    Cancer (HCC)    liver   Cataract    Chronic hepatitis C without hepatic coma (HCC) 09/19/2014   GERD (gastroesophageal reflux disease)    Hepatitis C    Hepatocellular carcinoma (HCC) 06/09/2018   HLD (hyperlipidemia)    Hypertension    Liver mass    Pulmonary nodule    Substance abuse (HCC)    Past Surgical History:  Procedure Laterality Date   COLONOSCOPY WITH PROPOFOL  N/A 01/30/2014   Procedure: COLONOSCOPY WITH PROPOFOL ;  Surgeon: Gladis MARLA Louder, MD;  Location: [facility];  Service: Endoscopy;  Laterality: N/A;   ESOPHAGOGASTRODUODENOSCOPY (EGD) WITH PROPOFOL  N/A 01/30/2014   Procedure: ESOPHAGOGASTRODUODENOSCOPY (EGD) WITH PROPOFOL ;  Surgeon: Gladis MARLA Louder, MD;  Location: [facility];  Service: Endoscopy;  Laterality: N/A;   IR ANGIOGRAM SELECTIVE EACH ADDITIONAL VESSEL  07/19/2018   IR ANGIOGRAM SELECTIVE EACH ADDITIONAL VESSEL  07/19/2018   IR ANGIOGRAM SELECTIVE EACH ADDITIONAL VESSEL  07/19/2018   IR ANGIOGRAM SELECTIVE EACH ADDITIONAL VESSEL  07/19/2018   IR ANGIOGRAM SELECTIVE EACH ADDITIONAL VESSEL  07/19/2018   IR ANGIOGRAM VISCERAL SELECTIVE   07/19/2018   IR EMBO TUMOR ORGAN ISCHEMIA INFARCT INC GUIDE ROADMAPPING  07/19/2018   IR RADIOLOGIST EVAL & MGMT  07/07/2018   IR RADIOLOGIST EVAL & MGMT  08/16/2018   IR RADIOLOGIST EVAL & MGMT  01/31/2019   IR RADIOLOGIST EVAL & MGMT  04/25/2019   IR RADIOLOGIST EVAL & MGMT  07/20/2019   IR RADIOLOGIST EVAL & MGMT  10/17/2019   IR RADIOLOGIST EVAL & MGMT  01/30/2020   IR RADIOLOGIST EVAL & MGMT  08/14/2020   IR RADIOLOGIST EVAL & MGMT  02/24/2022   IR RADIOLOGIST EVAL & MGMT  08/11/2022   IR RADIOLOGIST EVAL & MGMT  04/27/2023   IR RADIOLOGIST EVAL & MGMT  10/28/2023   IR US  GUIDE VASC ACCESS LEFT  07/19/2018   left jaw surgery      MANDIBLE FRACTURE SURGERY     TRANSCAROTID ARTERY REVASCULARIZATION  Right 07/12/2024   Procedure: RIGHT TRANSCAROTID ARTERY REVASCULARIZATION;  Surgeon: Gretta Lonni PARAS, MD;  Location: [facility] OR;  Service: Vascular;  Laterality: Right;   ULTRASOUND GUIDANCE FOR VASCULAR ACCESS Left 07/12/2024   Procedure: ULTRASOUND GUIDANCE, FOR VASCULAR ACCESS;  Surgeon: Gretta Lonni PARAS, MD;  Location: [facility] OR;  Service: Vascular;  Laterality: Left;   Patient Active Problem List   Diagnosis Date Noted   Acute cerebrovascular accident (CVA) (HCC) 07/04/2024   Helicobacter pylori infection 10/14/2023   Hepatocellular  carcinoma (HCC) 06/09/2018   Pulmonary nodule 05/25/2018   GERD (gastroesophageal reflux disease) 01/10/2018   Hiccough 10/07/2017   Alcohol abuse 10/07/2017   Chest pain 10/29/2015   Liver fibrosis (HCC) 10/23/2014   Chronic hepatitis C without hepatic coma (HCC) 09/19/2014   IFG (impaired fasting glucose) 04/02/2014   Vitamin D  deficiency 04/02/2014   Abnormal LFTs 04/02/2014   Cataract, right eye 01/05/2014   Essential hypertension, benign 01/05/2014   Tobacco abuse 01/05/2014    ONSET DATE: 07/10/2024 (date of referral)  REFERRING DIAG: I63.9 (ICD-10-CM) - Acute CVA (cerebrovascular accident) (HCC)  THERAPY DIAG:  Other abnormalities of gait and  mobility  History of falling  Hemiplegia and hemiparesis following cerebral infarction affecting left non-dominant side (HCC)  Muscle weakness (generalized)  Other symptoms and signs involving the nervous system  Other symptoms and signs involving the musculoskeletal system  Rationale for Evaluation and Treatment: Rehabilitation  SUBJECTIVE:                                                                                                                                                                                             SUBJECTIVE STATEMENT: Pt reports going to therapy d/t having a stroke.  L LE appeared to have been previously bleeding (dried blood noted on jeans)--pt reports from fall this morning (fell getting clothes out of dryer); no other injury from fall reported.  More difficulty (since stroke) noted with walking, balance, and L vision (pt reports no vision on L side).  Difficulty holding things with L hand. Pt accompanied by: self and friend Civil Engineer, [employer]); both took the bus to appt  PERTINENT HISTORY: Hospitalization 07/04/24-07/15/24: presented with c/o intermittent HA (associated blurred vision), dizziness, and falls with reported injuries; L>R LE weakness; pt with acute/subacute CVA; s/p R TCAR 07/12/24.  PMH includes alcoholism, h/o Hep C, liver CA, GERD, HLD, htn, pulmonary nodule, substance abuse.  PAIN:  Are you having pain? No  PRECAUTIONS: Fall  RED FLAGS: None   WEIGHT BEARING RESTRICTIONS: No  FALLS: Has patient fallen in last 6 months? Yes. Number of falls at least 6-7 fallsincluding 1 this morning  LIVING ENVIRONMENT: Lives with: lives in a boarding home Lives in: Other boarding house; 1 level Stairs: No; ramp to enter Has following equipment at home: Vannie - 2 wheeled, Wheelchair (manual), and Ramped entry; has w/c from friend  PLOF: Independent prior to stroke (was riding a bicycle); now using a RW for ambulation  PATIENT GOALS: Improve L leg and arm  strength; be able to get around better (is a very active person).  OBJECTIVE:  Note: Objective measures were completed at  Evaluation unless otherwise noted.  DIAGNOSTIC FINDINGS:  MRI Brain 07/05/24: 1. Patchy and indistinct combined acute and subacute ischemia in the posterior Right internal capsule, adjacent deep gray nuclei and temporal lobe white matter. No hemorrhagic transformation or mass effect. 2. 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 3. 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  COGNITION: Overall cognitive status: A&O x4; question insight into condition and fall risk   SENSATION: Decreased sensation lateral L lower leg and medial L foot  COORDINATION: Decreased L LE heel to shin in sitting (compared to R LE)  EDEMA:  None noted B LE's  MUSCLE TONE: LLE: mild catch L ankle  POSTURE: rounded shoulders and forward head  LOWER EXTREMITY ROM:     Active  Right Eval Left Eval  Hip flexion [facility]  Hip extension    Hip abduction    Hip adduction    Hip internal rotation    Hip external rotation    Knee flexion [facility] WFL  Knee extension [facility]  Ankle dorsiflexion [facility] WFL  Ankle plantarflexion [facility] WFL  Ankle inversion    Ankle eversion     (Blank rows = not tested)  LOWER EXTREMITY MMT:    MMT Right Eval Left Eval  Hip flexion 4/5 4+/5  Hip extension    Hip abduction    Hip adduction    Hip internal rotation    Hip external rotation    Knee flexion 5/5 4+/5  Knee extension 5/5 4+/5  Ankle dorsiflexion 5/5 4+/5  Ankle plantarflexion At least 3/5 AROM At least 3/5 AROM  Ankle inversion    Ankle eversion    (Blank rows = not tested)  BED MOBILITY:  Pt reports sometimes needing assist (pulls self up on bed as needed)  TRANSFERS: Sit to stand: CGA  Assistive device utilized: Environmental Consultant - 2 wheeled     Stand to sit: CGA   Assistive device utilized: Environmental Consultant - 2 wheeled    ; vc's to line up to chair prior to sitting  GAIT: Findings: Gait Characteristics: L forefoot sliding on floor to advance (impaired L foot clearance); decreased L hip flexion and knee flexion to advance L LE; mild L knee flexion during L LE stance phase (no knee buckling noted); very narrow BOS; vc's to stay closer to walker during ambulation and within walker during turns, Distance walked: clinic distances, Assistive device utilized:Walker - 2 wheeled, Level of assistance: CGA, and Comments: decreased gait speed noted  FUNCTIONAL TESTS:  5 times sit to stand: 29.81 seconds (no UE support; use of some momentum and hands occasionally on thighs) 10 meter walk test: 0.44 m/sec (22.25 seconds with RW)  PATIENT SURVEYS:  TBA  VITALS: Vitals:   08/08/24 1237  BP: 135/81  Pulse: 83  TREATMENT DATE: 08/08/2024   Therapeutic Exercise: Issued written HEP (see HEP below for details) Seated Long Arc Quad  x10 reps B LE's Seated March x10 reps B LE's Seated Heel Raise  x10 reps B LE's Seated Toe Raise  x10 reps B LE's  PATIENT EDUCATION: Education details: Eval results; POC; issued HEP. Person educated: Patient and friend Education method: Explanation, Demonstration, Verbal cues, and Handouts Education comprehension: verbalized understanding, returned demonstration, verbal cues required, and needs further education  HOME EXERCISE PROGRAM: Access Code: [email or web address] URL: [email or web address] Date: 08/08/2024 Prepared by: Damien Caulk  Exercises - Seated Long Arc Quad  - 1 x daily - 3-5 x weekly - 2-3 sets - 10 reps - Seated March  - 1 x daily - 3-5 x weekly - 2-3 sets - 10 reps - Seated Heel Raise  - 1 x daily - 3-5 x weekly - 2-3 sets - 10 reps - Seated Toe Raise  - 1 x daily - 3-5 x weekly - 2-3 sets -  10 reps  GOALS: Goals reviewed with patient? Yes  SHORT TERM GOALS: Target date: 09/05/2024  Pt will be at least 50% compliant with initial HEP in order to improve strength and balance in order to decrease fall risk and improve function at home for ADL's. Baseline: Issued 08/08/24 Goal status: INITIAL  2.  Assess BERG Balance Test and update LTG as needed. Baseline: TBA Goal status: INITIAL  3.  Pt will decrease 5 Time Sit to Stand by at least 3 seconds in order to demonstrate clinically significant improvement in LE strength. Baseline: 29.81 seconds Goal status: INITIAL  4.  Pt will demonstrate improved walker use for safety with gait. Baseline: vc's to stay closer to RW during ambulation and within RW for turns Goal status: INITIAL  LONG TERM GOALS: Target date: 10/03/2024  Pt will be >50% compliant with HEP in order to improve strength and balance in order to decrease fall risk and improve function at home for ADL's. Baseline: Issued 08/08/24 Goal status: INITIAL  2.  Pt will decrease 5 Time Sit to Stand by at least 3 seconds in order to demonstrate clinically significant improvement in LE strength. Baseline: 29.81 seconds Goal status: INITIAL  3.  Pt will increase by at least 0.13 m/s in order to demonstrate clinically significant improvement in community ambulation.  Baseline: 0.44 m/sec Goal status: INITIAL  4.  Pt will improve BERG by at least 3 points in order to demonstrate clinically significant improvement in balance. Baseline: TBA Goal status: INITIAL  5.  Patient will deny any falls over past 4 weeks to demonstrate improved safety awareness at home and work. Baseline: H/o fall AM of PT evaluation. Goal status: INITIAL  ASSESSMENT:  CLINICAL IMPRESSION: Patient is a 74 y.o. male who was seen today for physical therapy evaluation and treatment for CVA.  Patient presents with L visual impairments (no vision reported L eye), impaired L UE/LE strength, balance  impairments (h/o falls including 1 this morning), and impaired functional mobility. These impairments are limiting patient from daily ADL's and community activities.  Evaluation included the following assessment tools: 5 time sit to stand and .  Pt scored 29.81 seconds on the 5 time sit to stand test indicating pt is at increased risk of falls (>15 seconds = increased risk of falls).  Pt scored 0.44 m/sec on the 10 Meter Walk Test indicating pt is a limited community ambulator (Cut off scores: <0.4 m/s = household Ambulator, 0.4-0.8 m/s = limited  community Ambulator, >0.8 m/s = community Ambulator, >1.2 m/s = crossing a street, <1.0 = increased fall risk).   Patient will benefit from skilled PT to address noted impairments, improve overall function, and progress towards long term goals.  OBJECTIVE IMPAIRMENTS: Abnormal gait, cardiopulmonary status limiting activity, decreased activity tolerance, decreased balance, decreased cognition, decreased coordination, decreased endurance, decreased knowledge of condition, decreased knowledge of use of DME, decreased mobility, difficulty walking, decreased strength, decreased safety awareness, impaired sensation, impaired tone, impaired UE functional use, improper body mechanics, and postural dysfunction.   ACTIVITY LIMITATIONS: carrying, lifting, bending, standing, squatting, stairs, transfers, bed mobility, bathing, toileting, dressing, hygiene/grooming, locomotion level, and caring for others  PARTICIPATION LIMITATIONS: meal prep, cleaning, laundry, driving, shopping, and community activity  PERSONAL FACTORS: Age, Education, Fitness, Past/current experiences, Transportation, and 1-2 comorbidities: htn are also affecting patient's functional outcome.   REHAB POTENTIAL: Good  CLINICAL DECISION MAKING: Evolving/moderate complexity  EVALUATION COMPLEXITY: Moderate  PLAN:  PT FREQUENCY: 1x/week (pt reports only able to come 1x/week d/t difficulties with  transportation and having assist available to come with him)  PT DURATION: 8 weeks  PLANNED INTERVENTIONS: 97164- PT Re-evaluation, 97750- Physical Performance Testing, 97110-Therapeutic exercises, 97530- Therapeutic activity, [id number]- Neuromuscular re-education, 97535- Self Care, 02859- Manual therapy, [id number]- Gait training, [phone]- Orthotic Initial, [phone]- Orthotic/Prosthetic subsequent, [phone]- Aquatic Therapy, [phone]- Electrical stimulation (manual), Patient/Family education, Balance training, Stair training, Taping, Joint mobilization, Spinal mobilization, Vestibular training, Visual/preceptual remediation/compensation, Cognitive remediation, DME instructions, Cryotherapy, Moist heat, and Biofeedback  PLAN FOR NEXT SESSION: Assess BERG Balance test.  Check on HEP.  Balance; LE strengthening; visual scanning to L.  Transfers and gait training.   Damien Caulk, PT 08/08/2024, 7:39 PM        "

## 2024-08-07 ENCOUNTER — Other Ambulatory Visit: Payer: Self-pay

## 2024-08-07 ENCOUNTER — Other Ambulatory Visit: Payer: Self-pay | Admitting: *Deleted

## 2024-08-07 NOTE — Transitions of Care (Post Inpatient/ED Visit) (Signed)
 " Transition of Care week 3  Visit Note  08/07/2024  Name: Calvin Gardner MRN: [id number]          DOB: 1950/09/07  Situation: Patient enrolled in [facility] 30-day program. Visit completed with Mr. Grim by telephone.   Background:   Initial Transition Care Management Follow-up Telephone Call Discharge Date and Diagnosis: 07/15/24, Acute cerebrovascular accident   Past Medical History:  Diagnosis Date   Alcohol abuse 10/07/2017   Alcoholism (HCC)    Aortic atherosclerosis    Arthritis    Cancer (HCC)    liver   Cataract    Chronic hepatitis C without hepatic coma (HCC) 09/19/2014   GERD (gastroesophageal reflux disease)    Hepatitis C    Hepatocellular carcinoma (HCC) 06/09/2018   HLD (hyperlipidemia)    Hypertension    Liver mass    Pulmonary nodule    Substance abuse (HCC)     Assessment: Patient Reported Symptoms: Cognitive Cognitive Status: Able to follow simple commands, Alert and oriented to person, place, and time, Normal speech and language skills      Neurological Neurological Review of Symptoms: No symptoms reported Neurological Self-Management Outcome: 4 (good)  HEENT HEENT Symptoms Reported: No symptoms reported      Cardiovascular Cardiovascular Symptoms Reported: No symptoms reported Cardiovascular Self-Management Outcome: 4 (good)  Respiratory Respiratory Symptoms Reported: No symptoms reported    Endocrine Endocrine Symptoms Reported: No symptoms reported    Gastrointestinal Gastrointestinal Symptoms Reported: Constipation Additional Gastrointestinal Details: LBM 08/07/24 Gastrointestinal Management Strategies: Adequate rest, Coping strategies, Diet modification Gastrointestinal Self-Management Outcome: 4 (good)    Genitourinary Genitourinary Symptoms Reported: No symptoms reported    Integumentary Integumentary Symptoms Reported: No symptoms reported    Musculoskeletal Musculoskelatal Symptoms Reviewed: Weakness, Difficulty  walking Musculoskeletal Management Strategies: Coping strategies, Adequate rest, Routine screening Musculoskeletal Self-Management Outcome: 3 (uncertain) Musculoskeletal Comment: Patient reports falling today, due to tripping. Reports the wheels on his walker get stuck at times. Patient is scheduled with Neuro PT/OT evaluation on 08/08/24. He is planning to take the bus. Falls in the past year?: Yes Number of falls in past year: 2 or more Was there an injury with Fall?: No Fall Risk Category Calculator: 2 Patient Fall Risk Level: Moderate Fall Risk    Psychosocial Psychosocial Symptoms Reported: Not assessed         There were no vitals filed for this visit.    Medications Reviewed Today     Reviewed by Lucky Andrea LABOR, RN (Registered Nurse) on 08/07/24 at 1513  Med List Status: <None>   Medication Order Taking? Sig Documenting Provider Last Dose Status Informant  acetaminophen  (TYLENOL ) 500 MG tablet [id number] Yes Take 2 tablets (1,000 mg total) by mouth in the morning. Placey, Ronal Caldron, NP  Active   amLODipine  (NORVASC ) 5 MG tablet [id number] Yes Take 1 tablet (5 mg total) by mouth daily. Scarlett Ronal Caldron, NP  Active   aspirin  81 MG chewable tablet [id number] Yes Chew 1 tablet (81 mg total) by mouth daily. Placey, Ronal Caldron, NP  Active   atorvastatin  (LIPITOR) 20 MG tablet [id number] Yes Take 1 tablet (20 mg total) by mouth daily. Placey, Ronal Caldron, NP  Active   clopidogrel  (PLAVIX ) 75 MG tablet [id number] Yes Take 1 tablet by mouth daily Placey, Ronal Caldron, NP  Active   folic acid  (FOLVITE ) 1 MG tablet [id number] Yes Take 1 tablet (1 mg total) by mouth daily. Placey, Ronal Caldron, NP  Active   Multiple Vitamin (MULTIVITAMIN WITH MINERALS)  TABS tablet [id number] Yes Take 1 tablet by mouth daily. Placey, Ronal Caldron, NP  Active   thiamine  (VITAMIN B1) 100 MG tablet [id number] Yes Take 1 tablet (100 mg total) by mouth daily. Placey, Ronal Caldron, NP  Active             Recommendation:   Continue  Current Plan of Care  Follow Up Plan:   Telephone follow-up in 1 week  Andrea Dimes RN, BSN [place]  [facility] RN Care Manager [phone]     "

## 2024-08-07 NOTE — Patient Instructions (Signed)
 Visit Information  Thank you for taking time to visit with me today. Please don't hesitate to contact me if I can be of assistance to you before our next scheduled telephone appointment.   Following is a copy of your care plan:   Goals Addressed             This Visit's Progress    VBCI Transitions of Care (TOC) Care Plan       Problems:  Recent Hospitalization for treatment of CVA Knowledge Deficit Related to Medications and No Hospital Follow Up Provider appointment RNCM will assist with scheduling  Goal:  Over the next 30 days, the patient will not experience hospital readmission  Interventions:  Transitions of Care: Doctor Visits  - discussed the importance of doctor visits Post discharge activity limitations prescribed by provider reviewed Medication reviewed Bleeding precautions reviewed Advised purchasing a BP monitor and begin checking BP daily-revisited Reviewed upcoming appointments including: PT/OT Neuro Eval on 08/08/24, Heart and Vascular on 08/15/24 and Neurology on 09/27/24 Fall precautions reviewed Assisted with scheduling with Outpatient Rehab PT/OT on 08/08/24, verified patient is planning to take the bus Discussed scheduling transportation to appointments on 08/15/24-address provided, patient and SIL will call to schedule transportation Diet reviewed  Patient Self Care Activities:  Attend all scheduled provider appointments Call pharmacy for medication refills 3-7 days in advance of running out of medications Call provider office for new concerns or questions  Notify RN Care Manager of TOC call rescheduling needs Participate in Transition of Care Program/Attend TOC scheduled calls Take medications as prescribed   Call and schedule with Outpatient Rehab and Neurology Stroke Clinic  Plan:  Telephone follow up appointment with care management team member scheduled for:  08/11/24 at 10:45am        The patient verbalized understanding of instructions,  educational materials, and care plan provided today and agreed to receive a mailed copy of patient instructions, educational materials, and care plan.   Telephone follow up appointment with care management team member scheduled for:08/11/24 at 10:45am  Please call the care guide team at [phone] if you need to cancel or reschedule your appointment.   Please call [phone]-TALK (toll free, 24 hour hotline) go to [facility] Urgent [facility] [address] [place] [phone]) call 911 if you are experiencing a Mental Health or Behavioral Health Crisis or need someone to talk to.  Andrea Dimes RN, BSN [place]  Value-Based Care Institute [facility] RN Care Manager [phone]

## 2024-08-07 NOTE — Therapy (Signed)
 " OUTPATIENT OCCUPATIONAL THERAPY NEURO EVALUATION  Patient Name: Calvin Gardner MRN: [id number] DOB:[birth year], 74 y.o., male Today's Date: 08/08/2024  PCP: Delbert Clam, MD REFERRING PROVIDER: Donnamarie Lebron PARAS, MD  END OF SESSION:  OT End of Session - 08/08/24 1438     Visit Number 1    Number of Visits 9   including eval   Authorization Type UHC MCR/UHC DUAL-NO AUTH REQUIRED    OT Start Time 1315    OT Stop Time 1408    OT Time Calculation (min) 53 min    Activity Tolerance Patient tolerated treatment well    Behavior During Therapy [facility] for tasks assessed/performed          Past Medical History:  Diagnosis Date   Alcohol abuse 10/07/2017   Alcoholism (HCC)    Aortic atherosclerosis    Arthritis    Cancer (HCC)    liver   Cataract    Chronic hepatitis C without hepatic coma (HCC) 09/19/2014   GERD (gastroesophageal reflux disease)    Hepatitis C    Hepatocellular carcinoma (HCC) 06/09/2018   HLD (hyperlipidemia)    Hypertension    Liver mass    Pulmonary nodule    Substance abuse (HCC)    Past Surgical History:  Procedure Laterality Date   COLONOSCOPY WITH PROPOFOL  N/A 01/30/2014   Procedure: COLONOSCOPY WITH PROPOFOL ;  Surgeon: Gladis MARLA Louder, MD;  Location: [facility];  Service: Endoscopy;  Laterality: N/A;   ESOPHAGOGASTRODUODENOSCOPY (EGD) WITH PROPOFOL  N/A 01/30/2014   Procedure: ESOPHAGOGASTRODUODENOSCOPY (EGD) WITH PROPOFOL ;  Surgeon: Gladis MARLA Louder, MD;  Location: [facility];  Service: Endoscopy;  Laterality: N/A;   IR ANGIOGRAM SELECTIVE EACH ADDITIONAL VESSEL  07/19/2018   IR ANGIOGRAM SELECTIVE EACH ADDITIONAL VESSEL  07/19/2018   IR ANGIOGRAM SELECTIVE EACH ADDITIONAL VESSEL  07/19/2018   IR ANGIOGRAM SELECTIVE EACH ADDITIONAL VESSEL  07/19/2018   IR ANGIOGRAM SELECTIVE EACH ADDITIONAL VESSEL  07/19/2018   IR ANGIOGRAM VISCERAL SELECTIVE  07/19/2018   IR EMBO TUMOR ORGAN ISCHEMIA INFARCT INC GUIDE ROADMAPPING  07/19/2018   IR  RADIOLOGIST EVAL & MGMT  07/07/2018   IR RADIOLOGIST EVAL & MGMT  08/16/2018   IR RADIOLOGIST EVAL & MGMT  01/31/2019   IR RADIOLOGIST EVAL & MGMT  04/25/2019   IR RADIOLOGIST EVAL & MGMT  07/20/2019   IR RADIOLOGIST EVAL & MGMT  10/17/2019   IR RADIOLOGIST EVAL & MGMT  01/30/2020   IR RADIOLOGIST EVAL & MGMT  08/14/2020   IR RADIOLOGIST EVAL & MGMT  02/24/2022   IR RADIOLOGIST EVAL & MGMT  08/11/2022   IR RADIOLOGIST EVAL & MGMT  04/27/2023   IR RADIOLOGIST EVAL & MGMT  10/28/2023   IR US  GUIDE VASC ACCESS LEFT  07/19/2018   left jaw surgery      MANDIBLE FRACTURE SURGERY     TRANSCAROTID ARTERY REVASCULARIZATION  Right 07/12/2024   Procedure: RIGHT TRANSCAROTID ARTERY REVASCULARIZATION;  Surgeon: Gretta Lonni PARAS, MD;  Location[facility];  Service: Vascular;  Laterality: Right;   ULTRASOUND GUIDANCE FOR VASCULAR ACCESS Left 07/12/2024   Procedure: ULTRASOUND GUIDANCE, FOR VASCULAR ACCESS;  Surgeon: Gretta Lonni PARAS, MD;  Location: [facility] OR;  Service: Vascular;  Laterality: Left;   Patient Active Problem List   Diagnosis Date Noted   Acute cerebrovascular accident (CVA) (HCC) 07/04/2024   Helicobacter pylori infection 10/14/2023   Hepatocellular carcinoma (HCC) 06/09/2018   Pulmonary nodule 05/25/2018   GERD (gastroesophageal reflux disease) 01/10/2018   Hiccough 10/07/2017   Alcohol  abuse 10/07/2017   Chest pain 10/29/2015   Liver fibrosis (HCC) 10/23/2014   Chronic hepatitis C without hepatic coma (HCC) 09/19/2014   IFG (impaired fasting glucose) 04/02/2014   Vitamin D  deficiency 04/02/2014   Abnormal LFTs 04/02/2014   Cataract, right eye 01/05/2014   Essential hypertension, benign 01/05/2014   Tobacco abuse 01/05/2014    ONSET DATE: 07/10/2024  12/2/-07/15/24 hospitalization  REFERRING DIAG: I63.9 (ICD-10-CM) - Acute CVA (cerebrovascular accident) (HCC)  THERAPY DIAG:  Other lack of coordination  Visuospatial deficit  Muscle weakness (generalized)  Other symptoms and  signs involving the musculoskeletal system  Left-sided weakness  Rationale for Evaluation and Treatment: Rehabilitation  SUBJECTIVE:   SUBJECTIVE STATEMENT: Pt reports I was biking and cooking before. Pt accompanied by: friend  PERTINENT HISTORY: CT (+) acute territorial infarct, MRI (+) acute subacute ischemia in posterior R internal capsule, temporal lobe, small subacute infarct in R MCA motor strip. EEG showed encephalopathy and cortical dysfunction in L frontotemporal region likely 2/2 underlying structural abnormality. PMH ETOH abuse, aortic atherosclerosis, OA, Hep C, liver CA, cataract,HLD, HTN, Pulmonary nodule, substance abuse, smoker  PRECAUTIONS: Fall, hx of liver cancer no electrical modalities, L sided weakness and L visual field deficit  WEIGHT BEARING RESTRICTIONS: No  PAIN:  Are you having pain? No  FALLS: Has patient fallen in last 6 months? Yes. Number of falls multiple, fell in front room of clinic  LIVING ENVIRONMENT: Lives with: lives in a boarding home Lives in: House/apartment1 level Stairs: ramp Has following equipment at home: Vannie - 2 wheeled and Wheelchair (manual)  PLOF: Independent and Leisure: loves bike riding  PATIENT GOALS: To be able to do the things I was doing, like cooking.   OBJECTIVE:  Note: Objective measures were completed at Evaluation unless otherwise noted.  HAND DOMINANCE: Right  ADLs: Overall ADLs: Able to complete, but takes longer Transfers/ambulation related to ADLs: Eating: Independent Grooming: Independent UB Dressing: With prolonged time LB Dressing: With prolonged  Toileting: With prolonged time Bathing: With prolonged time Tub Shower transfers:  Equipment: stand in shower, lip to step over. Pt denies any grab bars, but states he does have a board overhead that he holds onto  IADLs: Shopping: Not completing Light housekeeping: Making bed and keeping room clean Meal Prep: Not completing at this  time Community mobility: was not driving prior to stroke, community transport Medication management: [employer] management: Has 2 bills rent and cable, which he pays. Handwriting: 100% legible pt reports that it was neater before stroke  MOBILITY STATUS: Hx of falls  POSTURE COMMENTS:  anterior pelvic tilt Sitting balance: WNL  ACTIVITY TOLERANCE: Activity tolerance: requires longer to complete basic self care tasks  FUNCTIONAL OUTCOME MEASURES: Upper Extremity Functional Scale (UEFS): 33/80, 58.75% impairment  UPPER EXTREMITY ROM:    Active ROM Right eval Left eval  Shoulder flexion    Shoulder abduction    Shoulder adduction    Shoulder extension    Shoulder internal rotation    Shoulder external rotation    Elbow flexion    Elbow extension    Wrist flexion    Wrist extension    Wrist ulnar deviation    Wrist radial deviation    Wrist pronation    Wrist supination    (Blank rows = not tested)  UPPER EXTREMITY MMT:     MMT Right eval Left eval  Shoulder flexion    Shoulder abduction    Shoulder adduction    Shoulder extension  Shoulder internal rotation    Shoulder external rotation    Middle trapezius    Lower trapezius    Elbow flexion    Elbow extension    Wrist flexion    Wrist extension    Wrist ulnar deviation    Wrist radial deviation    Wrist pronation    Wrist supination    (Blank rows = not tested)  HAND FUNCTION: Grip strength: Right: 75.7 lbs; Left: 57.3 lbs (R: 78.9, 77.1, 71.2 L: 59.9, 56.6, 55.5) COORDINATION: 9 Hole Peg test: Right: 41 sec; Left: 83 sec Box and Blocks:  Right 22 blocks, Left 14blocks  SENSATION: WFL  EDEMA: none  MUSCLE TONE: RUE: Within functional limits and LUE: Within functional limits  COGNITION: Overall cognitive status: Within functional limits for tasks assessed  VISION: Subjective report: Per PT, pt has impaired vision on L side, pt confirmed this. Baseline vision: No visual  deficits Visual history: cataracts  VISION ASSESSMENT: Visual Fields: Left visual field deficits  Patient has difficulty with following activities due to following visual impairments: walking  PERCEPTION: WFL  PRAXIS: Not tested  OBSERVATIONS: impaired FM coordination in L hand, grip strength, requiring increased time to complete ADLs, unable to complete some IADLs that he was completing before, impaired ability to grip things                                                                                                                             TREATMENT DATE: 08/08/24  Educated pt in purpose of OT, goals, and POC. Pt in agreement. Cut tennis balls for decreased noise and easier manipulation of FWW. D/t fall risk, escorted pt to front desk to get scheduled for appts, and assisted in calling a cab for ride back to group home. Functional mobility to cab CGA with FWW and mod cues for directions, CGA and mod verbal and tactile cues to transfer into cab.      PATIENT EDUCATION: Education details: Purpose of OT Person educated: Patient Education method: Explanation Education comprehension: verbalized understanding  HOME EXERCISE PROGRAM:    GOALS: Goals reviewed with patient? Yes  SHORT TERM GOALS: Target date: 09/08/24  Patient will demonstrate updated LUE HEP with 25% verbal cues or less for proper execution.  Baseline: New to OP OT Goal status: INITIAL  2.  Patient will independently recall at least 2 compensatory strategies for visual impairment without cueing.  Baseline: New to OP OT Goal status: INITIAL  3.  Pt will verbalize understanding of adapted strategies and/or equipment PRN to increase safety and independence with ADLs and IADLs (I.e. button hook, jar openers/bottle openers, elastic shoe laces or one handed shoe tying technique)  Baseline: Pt reports difficulty tying shoes, doing up buttons, and getting belt under all loops of pants  Goal status: INITIAL  4.   Patient will demonstrate at least 67 lbs L grip strength as needed to open jars and other containers.  Baseline: Right: 75.7 avg  lbs; Left: 57.3 avg lbs Goal status: INITIAL    LONG TERM GOALS: Target date: 11/06/24  Pt will increase UEFS score to at least 45/80 to demonstrate improved function Baseline: 33/80, 58.75% impairment Goal status: INITIAL  2.  Pt will be able to place at least 20 blocks using left hand and 30 blocks with R hand with completion of Box and Blocks test.  Baseline: Right 22 blocks, Left 14blocks Goal status: INITIAL  3.  Patient will demo improved FM coordination as evidenced by completing nine-hole peg with use of L hand in 60 seconds or less.  Baseline: 9 Hole Peg test: Right: 41 sec; Left: 83 sec Goal status: INITIAL  4.  Patient will independently use visual compensatory strategies for locating at least 14/15 objects without cueing in visual distracting environment.  Baseline: Difficulty locating objects especially on L side Goal status: INITIAL   ASSESSMENT:  CLINICAL IMPRESSION: Patient is a 74 y.o. male who was seen today for occupational therapy evaluation for acute CVA. Hx includes PMH ETOH abuse, aortic atherosclerosis, OA, Hep C, liver CA, cataract,HLD, HTN, Pulmonary nodule, substance abuse, smoker. Patient currently presents below baseline level of functioning demonstrating functional deficits and impairments as noted below. Pt would benefit from skilled OT services in the outpatient setting to work on impairments as noted below to help pt return to PLOF as able.    PERFORMANCE DEFICITS: in functional skills including ADLs, IADLs, coordination, dexterity, balance, vision, and UE functional use, cognitive skills including safety awareness, and psychosocial skills including environmental adaptation, habits, and routines and behaviors.   IMPAIRMENTS: are limiting patient from ADLs, IADLs, work, leisure, and social participation.   CO-MORBIDITIES:  may have co-morbidities  that affects occupational performance. Patient will benefit from skilled OT to address above impairments and improve overall function.  MODIFICATION OR ASSISTANCE TO COMPLETE EVALUATION: Min-Moderate modification of tasks or assist with assess necessary to complete an evaluation.  OT OCCUPATIONAL PROFILE AND HISTORY: Detailed assessment: Review of records and additional review of physical, cognitive, psychosocial history related to current functional performance.  CLINICAL DECISION MAKING: Moderate - several treatment options, min-mod task modification necessary  REHAB POTENTIAL: Good  EVALUATION COMPLEXITY: Moderate    PLAN:  OT FREQUENCY: 1x/week  OT DURATION: 8 weeks  PLANNED INTERVENTIONS: 97168 OT Re-evaluation, 97535 self care/ADL training, 02889 therapeutic exercise, 97530 therapeutic activity, 97112 neuromuscular re-education, 97140 manual therapy, passive range of motion, visual/perceptual remediation/compensation, energy conservation, coping strategies training, patient/family education, and DME and/or AE instructions  RECOMMENDED OTHER SERVICES: none at this time; received PT eval. Monitor if speech referral needed.  CONSULTED AND AGREED WITH PLAN OF CARE: Patient  PLAN FOR NEXT SESSION: vision HEP and compensatory strategies  Coordination HEP Putty HEP   Ethleen Lormand, OT 08/08/2024, 2:42 PM           "

## 2024-08-08 ENCOUNTER — Ambulatory Visit: Attending: Internal Medicine | Admitting: Physical Therapy

## 2024-08-08 ENCOUNTER — Other Ambulatory Visit: Payer: Self-pay

## 2024-08-08 ENCOUNTER — Ambulatory Visit

## 2024-08-08 ENCOUNTER — Encounter: Payer: Self-pay | Admitting: Physical Therapy

## 2024-08-08 VITALS — BP 135/81 | HR 83

## 2024-08-08 DIAGNOSIS — R531 Weakness: Secondary | ICD-10-CM

## 2024-08-08 DIAGNOSIS — M6281 Muscle weakness (generalized): Secondary | ICD-10-CM

## 2024-08-08 DIAGNOSIS — R2689 Other abnormalities of gait and mobility: Secondary | ICD-10-CM | POA: Diagnosis present

## 2024-08-08 DIAGNOSIS — I639 Cerebral infarction, unspecified: Secondary | ICD-10-CM | POA: Insufficient documentation

## 2024-08-08 DIAGNOSIS — R29898 Other symptoms and signs involving the musculoskeletal system: Secondary | ICD-10-CM

## 2024-08-08 DIAGNOSIS — R41842 Visuospatial deficit: Secondary | ICD-10-CM | POA: Diagnosis present

## 2024-08-08 DIAGNOSIS — R278 Other lack of coordination: Secondary | ICD-10-CM | POA: Diagnosis present

## 2024-08-08 DIAGNOSIS — I69354 Hemiplegia and hemiparesis following cerebral infarction affecting left non-dominant side: Secondary | ICD-10-CM | POA: Insufficient documentation

## 2024-08-08 DIAGNOSIS — R29818 Other symptoms and signs involving the nervous system: Secondary | ICD-10-CM | POA: Insufficient documentation

## 2024-08-08 DIAGNOSIS — Z9181 History of falling: Secondary | ICD-10-CM | POA: Insufficient documentation

## 2024-08-09 ENCOUNTER — Ambulatory Visit: Admitting: *Deleted

## 2024-08-09 ENCOUNTER — Ambulatory Visit: Payer: Self-pay | Admitting: *Deleted

## 2024-08-11 ENCOUNTER — Telehealth: Payer: Self-pay | Admitting: *Deleted

## 2024-08-11 ENCOUNTER — Encounter: Payer: Self-pay | Admitting: *Deleted

## 2024-08-14 ENCOUNTER — Encounter: Payer: Self-pay | Admitting: *Deleted

## 2024-08-14 ENCOUNTER — Telehealth: Payer: Self-pay | Admitting: *Deleted

## 2024-08-14 NOTE — Transitions of Care (Post Inpatient/ED Visit) (Signed)
 " Transition of Care week 4  Visit Note  08/14/2024  Name: Calvin Gardner MRN: [id number]          DOB: 06-26-1951  Situation: Patient enrolled in [facility] 30-day program. Visit completed with Mr. Godeaux by telephone.   Background:   Initial Transition Care Management Follow-up Telephone Call Discharge Date and Diagnosis: 07/15/24, Acute cerebrovascular accident   Past Medical History:  Diagnosis Date   Alcohol abuse 10/07/2017   Alcoholism (HCC)    Aortic atherosclerosis    Arthritis    Cancer (HCC)    liver   Cataract    Chronic hepatitis C without hepatic coma (HCC) 09/19/2014   GERD (gastroesophageal reflux disease)    Hepatitis C    Hepatocellular carcinoma (HCC) 06/09/2018   HLD (hyperlipidemia)    Hypertension    Liver mass    Pulmonary nodule    Substance abuse (HCC)     Assessment: Patient Reported Symptoms: Cognitive Cognitive Status: Able to follow simple commands, Alert and oriented to person, place, and time, Normal speech and language skills      Neurological Neurological Review of Symptoms: Headaches Neurological Management Strategies: Coping strategies, Adequate rest, Medication therapy, Routine screening Neurological Self-Management Outcome: 3 (uncertain) Neurological Comment: Patient is scheduled with Neuro rehab PT/OT. Post op with [facility] on 08/15/24  HEENT HEENT Symptoms Reported: Not assessed      Cardiovascular Cardiovascular Symptoms Reported: No symptoms reported    Respiratory Respiratory Symptoms Reported: No symptoms reported    Endocrine Endocrine Symptoms Reported: No symptoms reported    Gastrointestinal Gastrointestinal Symptoms Reported: Constipation Additional Gastrointestinal Details: LBM 08/14/24 Gastrointestinal Management Strategies: Adequate rest Gastrointestinal Self-Management Outcome: 3 (uncertain) Gastrointestinal Comment: RNCM provided eduation on diet, hydration and exercise. Advised an OTC stool softener if needed.     Genitourinary Genitourinary Symptoms Reported: No symptoms reported    Integumentary Integumentary Symptoms Reported: No symptoms reported    Musculoskeletal Musculoskelatal Symptoms Reviewed: Difficulty walking, Weakness Additional Musculoskeletal Details: ambulating with RW. Patient reports falling 3 times recently. Musculoskeletal Management Strategies: Coping strategies, Adequate rest, Routine screening, Medical device Musculoskeletal Self-Management Outcome: 3 (uncertain) Musculoskeletal Comment: RNCM reviewed fall precautions, discussed the importance of PT/OT and attending upcoming appointments. Reviewed bleeding precautions Falls in the past year?: Yes Number of falls in past year: 2 or more Was there an injury with Fall?: No Fall Risk Category Calculator: 2 Patient Fall Risk Level: Moderate Fall Risk Patient at Risk for Falls Due to: History of fall(s), Impaired balance/gait Fall risk Follow up: Education provided, Falls prevention discussed  Psychosocial Psychosocial Symptoms Reported: No symptoms reported         There were no vitals filed for this visit.    Medications Reviewed Today     Reviewed by Lucky Andrea LABOR, RN (Registered Nurse) on 08/14/24 at 1431  Med List Status: <None>   Medication Order Taking? Sig Documenting Provider Last Dose Status Informant  acetaminophen  (TYLENOL ) 500 MG tablet [id number] Yes Take 2 tablets (1,000 mg total) by mouth in the morning. Placey, Ronal Caldron, NP  Active   amLODipine  (NORVASC ) 5 MG tablet [id number] Yes Take 1 tablet (5 mg total) by mouth daily. Scarlett Ronal Caldron, NP  Active   aspirin  81 MG chewable tablet [id number] Yes Chew 1 tablet (81 mg total) by mouth daily. Placey, Ronal Caldron, NP  Active   atorvastatin  (LIPITOR) 20 MG tablet [id number] Yes Take 1 tablet (20 mg total) by mouth daily. Placey, Ronal Caldron, NP  Active   clopidogrel  (  PLAVIX ) 75 MG tablet [id number] Yes Take 1 tablet by mouth daily Placey, Ronal Caldron, NP  Active   folic  acid (FOLVITE ) 1 MG tablet [id number] Yes Take 1 tablet (1 mg total) by mouth daily. Placey, Ronal Caldron, NP  Active   Multiple Vitamin (MULTIVITAMIN WITH MINERALS) TABS tablet [id number] Yes Take 1 tablet by mouth daily. Placey, Ronal Caldron, NP  Active   thiamine  (VITAMIN B1) 100 MG tablet [id number] Yes Take 1 tablet (100 mg total) by mouth daily. Placey, Ronal Caldron, NP  Active             Recommendation:   Continue Current Plan of Care  Follow Up Plan:   Telephone follow-up in 1 week  Andrea Dimes RN, BSN [place]  Value-Based Care [facility] Health RN Care Manager [phone]     "

## 2024-08-14 NOTE — Patient Instructions (Signed)
 Visit Information  Thank you for taking time to visit with me today. Please don't hesitate to contact me if I can be of assistance to you before our next scheduled telephone appointment.   Following is a copy of your care plan:   Goals Addressed             This Visit's Progress    [facility] Transitions of Care (TOC) Care Plan       Problems:  Recent Hospitalization for treatment of CVA Knowledge Deficit Related to Medications and No Hospital Follow Up Provider appointment RNCM will assist with scheduling  Goal:  Over the next 30 days, the patient will not experience hospital readmission  Interventions:  Transitions of Care: Doctor Visits  - discussed the importance of doctor visits Post discharge activity limitations prescribed by provider reviewed Medication reviewed Bleeding precautions reviewed Advised purchasing a BP monitor and begin checking BP daily-revisited Reviewed upcoming appointments including:  Heart and Vascular on 08/15/24, PT/OT on 08/18/24 and Neurology on 09/27/24 Fall precautions reviewed Provided education on Constipation   Patient Self Care Activities:  Attend all scheduled provider appointments Call pharmacy for medication refills 3-7 days in advance of running out of medications Call provider office for new concerns or questions  Notify RN Care Manager of TOC call rescheduling needs Participate in Transition of Care Program/Attend TOC scheduled calls Take medications as prescribed   Call and schedule with Outpatient Rehab and Neurology Stroke Clinic  Plan:  Telephone follow up appointment with care management team member scheduled for:  08/22/24 at 1pm        Patient verbalizes understanding of instructions and care plan provided today and agrees to view in MyChart. Active MyChart status and patient understanding of how to access instructions and care plan via MyChart confirmed with patient.     Telephone follow up appointment with care management  team member scheduled for:08/22/24 at 1pm  Please call the care guide team at [phone] if you need to cancel or reschedule your appointment.   Please call [phone]-TALK (toll free, 24 hour hotline) go to [facility] Urgent [facility] [address], [place] [phone]) call 911 if you are experiencing a Mental Health or Behavioral Health Crisis or need someone to talk to.  Andrea Dimes RN, BSN [place]  Value-Based Care [facility] Health RN Care Manager [phone]

## 2024-08-15 ENCOUNTER — Ambulatory Visit (HOSPITAL_COMMUNITY)

## 2024-08-15 ENCOUNTER — Other Ambulatory Visit: Payer: Self-pay

## 2024-08-18 ENCOUNTER — Encounter: Payer: Self-pay | Admitting: Physical Therapy

## 2024-08-18 ENCOUNTER — Ambulatory Visit

## 2024-08-18 ENCOUNTER — Ambulatory Visit: Admitting: Physical Therapy

## 2024-08-18 VITALS — BP 140/79 | HR 79

## 2024-08-18 DIAGNOSIS — M6281 Muscle weakness (generalized): Secondary | ICD-10-CM

## 2024-08-18 DIAGNOSIS — R2689 Other abnormalities of gait and mobility: Secondary | ICD-10-CM

## 2024-08-18 DIAGNOSIS — R531 Weakness: Secondary | ICD-10-CM

## 2024-08-18 DIAGNOSIS — R29898 Other symptoms and signs involving the musculoskeletal system: Secondary | ICD-10-CM

## 2024-08-18 DIAGNOSIS — R278 Other lack of coordination: Secondary | ICD-10-CM

## 2024-08-18 DIAGNOSIS — Z9181 History of falling: Secondary | ICD-10-CM

## 2024-08-18 DIAGNOSIS — R41842 Visuospatial deficit: Secondary | ICD-10-CM

## 2024-08-18 DIAGNOSIS — I69354 Hemiplegia and hemiparesis following cerebral infarction affecting left non-dominant side: Secondary | ICD-10-CM

## 2024-08-18 NOTE — Therapy (Signed)
 " OUTPATIENT PHYSICAL THERAPY NEURO TREATMENT   Patient Name: Calvin Gardner MRN: [id number] DOB:[birth year], 74 y.o., male Today's Date: 08/18/2024   PCP: Delbert Clam, MD REFERRING PROVIDER: Donnamarie Lebron PARAS, MD  END OF SESSION:  PT End of Session - 08/18/24 1410     Visit Number 2    Number of Visits 9   8 visits plus Eval   Date for Recertification  10/17/24   for scheduling delays   Authorization Type [employer] Dual    PT Start Time 1403    PT Stop Time 1444    PT Time Calculation (min) 41 min    Equipment Utilized During Treatment Gait belt    Activity Tolerance Patient tolerated treatment well    Behavior During Therapy [facility] for tasks assessed/performed          Past Medical History:  Diagnosis Date   Alcohol abuse 10/07/2017   Alcoholism (HCC)    Aortic atherosclerosis    Arthritis    Cancer (HCC)    liver   Cataract    Chronic hepatitis C without hepatic coma (HCC) 09/19/2014   GERD (gastroesophageal reflux disease)    Hepatitis C    Hepatocellular carcinoma (HCC) 06/09/2018   HLD (hyperlipidemia)    Hypertension    Liver mass    Pulmonary nodule    Substance abuse (HCC)    Past Surgical History:  Procedure Laterality Date   COLONOSCOPY WITH PROPOFOL  N/A 01/30/2014   Procedure: COLONOSCOPY WITH PROPOFOL ;  Surgeon: Gladis MARLA Louder, MD;  Location: [facility];  Service: Endoscopy;  Laterality: N/A;   ESOPHAGOGASTRODUODENOSCOPY (EGD) WITH PROPOFOL  N/A 01/30/2014   Procedure: ESOPHAGOGASTRODUODENOSCOPY (EGD) WITH PROPOFOL ;  Surgeon: Gladis MARLA Louder, MD;  Location: [facility];  Service: Endoscopy;  Laterality: N/A;   IR ANGIOGRAM SELECTIVE EACH ADDITIONAL VESSEL  07/19/2018   IR ANGIOGRAM SELECTIVE EACH ADDITIONAL VESSEL  07/19/2018   IR ANGIOGRAM SELECTIVE EACH ADDITIONAL VESSEL  07/19/2018   IR ANGIOGRAM SELECTIVE EACH ADDITIONAL VESSEL  07/19/2018   IR ANGIOGRAM SELECTIVE EACH ADDITIONAL VESSEL  07/19/2018   IR ANGIOGRAM VISCERAL SELECTIVE   07/19/2018   IR EMBO TUMOR ORGAN ISCHEMIA INFARCT INC GUIDE ROADMAPPING  07/19/2018   IR RADIOLOGIST EVAL & MGMT  07/07/2018   IR RADIOLOGIST EVAL & MGMT  08/16/2018   IR RADIOLOGIST EVAL & MGMT  01/31/2019   IR RADIOLOGIST EVAL & MGMT  04/25/2019   IR RADIOLOGIST EVAL & MGMT  07/20/2019   IR RADIOLOGIST EVAL & MGMT  10/17/2019   IR RADIOLOGIST EVAL & MGMT  01/30/2020   IR RADIOLOGIST EVAL & MGMT  08/14/2020   IR RADIOLOGIST EVAL & MGMT  02/24/2022   IR RADIOLOGIST EVAL & MGMT  08/11/2022   IR RADIOLOGIST EVAL & MGMT  04/27/2023   IR RADIOLOGIST EVAL & MGMT  10/28/2023   IR US  GUIDE VASC ACCESS LEFT  07/19/2018   left jaw surgery      MANDIBLE FRACTURE SURGERY     TRANSCAROTID ARTERY REVASCULARIZATION  Right 07/12/2024   Procedure: RIGHT TRANSCAROTID ARTERY REVASCULARIZATION;  Surgeon: Gretta Lonni PARAS, MD;  Location: [facility] OR;  Service: Vascular;  Laterality: Right;   ULTRASOUND GUIDANCE FOR VASCULAR ACCESS Left 07/12/2024   Procedure: ULTRASOUND GUIDANCE, FOR VASCULAR ACCESS;  Surgeon: Gretta Lonni PARAS, MD;  Location: [facility] OR;  Service: Vascular;  Laterality: Left;   Patient Active Problem List   Diagnosis Date Noted   Acute cerebrovascular accident (CVA) (HCC) 07/04/2024   Helicobacter pylori infection 10/14/2023   Hepatocellular  carcinoma (HCC) 06/09/2018   Pulmonary nodule 05/25/2018   GERD (gastroesophageal reflux disease) 01/10/2018   Hiccough 10/07/2017   Alcohol abuse 10/07/2017   Chest pain 10/29/2015   Liver fibrosis (HCC) 10/23/2014   Chronic hepatitis C without hepatic coma (HCC) 09/19/2014   IFG (impaired fasting glucose) 04/02/2014   Vitamin D  deficiency 04/02/2014   Abnormal LFTs 04/02/2014   Cataract, right eye 01/05/2014   Essential hypertension, benign 01/05/2014   Tobacco abuse 01/05/2014    ONSET DATE: 07/10/2024 (date of referral)  REFERRING DIAG: I63.9 (ICD-10-CM) - Acute CVA (cerebrovascular accident) (HCC)  THERAPY DIAG:  Other lack of  coordination  Muscle weakness (generalized)  Other symptoms and signs involving the musculoskeletal system  Other abnormalities of gait and mobility  History of falling  Hemiplegia and hemiparesis following cerebral infarction affecting left non-dominant side (HCC)  Rationale for Evaluation and Treatment: Rehabilitation  SUBJECTIVE:                                                                                                                                                                                             SUBJECTIVE STATEMENT: Pt received from OT session (pt needing to use bathroom first).  OT assisted pt walking to bathroom with RW; PT opened bathroom door for pt but pt left in bathroom alone to toilet on his own (pt reporting not needing any help in bathroom); PT opened bathroom door when pt knocked on it and assisted walking pt back from bathroom with use of RW.  Pt reports doing HEP.  No recent falls reported. Pt accompanied by: self (took cab; will call his brother for Gisele when done with therapy).  PERTINENT HISTORY: Hospitalization 07/04/24-07/15/24: presented with c/o intermittent HA (associated blurred vision), dizziness, and falls with reported injuries; L>R LE weakness; pt with acute/subacute CVA; s/p R TCAR 07/12/24.  PMH includes alcoholism, h/o Hep C, liver CA, GERD, HLD, htn, pulmonary nodule, substance abuse.  PAIN:  Are you having pain? B thigh soreness 5/10 (pt reports not sure why)  PRECAUTIONS: Fall  RED FLAGS: None   WEIGHT BEARING RESTRICTIONS: No  FALLS: Has patient fallen in last 6 months? Yes. Number of falls at least 6-7 fallsincluding 1 this morning  LIVING ENVIRONMENT: Lives with: lives in a boarding home Lives in: Other boarding house; 1 level Stairs: No; ramp to enter Has following equipment at home: Vannie - 2 wheeled, Wheelchair (manual), and Ramped entry; has w/c from friend  PLOF: Independent prior to stroke (was riding a bicycle);  now using a RW for ambulation  PATIENT GOALS: Improve L leg and arm strength; be able to get  around better (is a very active person).  OBJECTIVE:  Note: Objective measures were completed at Evaluation unless otherwise noted.  DIAGNOSTIC FINDINGS:  MRI Brain 07/05/24: 1. Patchy and indistinct combined acute and subacute ischemia in the posterior Right internal capsule, adjacent deep gray nuclei and temporal lobe white matter. No hemorrhagic transformation or mass effect. 2. 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 3. 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  COGNITION: Overall cognitive status: A&O x4; question insight into condition and fall risk   SENSATION: Decreased sensation lateral L lower leg and medial L foot  COORDINATION: Decreased L LE heel to shin in sitting (compared to R LE)  EDEMA:  None noted B LE's  MUSCLE TONE: LLE: mild catch L ankle  POSTURE: rounded shoulders and forward head  LOWER EXTREMITY ROM:     Active  Right Eval Left Eval  Hip flexion [facility]  Hip extension    Hip abduction    Hip adduction    Hip internal rotation    Hip external rotation    Knee flexion [facility] WFL  Knee extension [facility]  Ankle dorsiflexion [facility] WFL  Ankle plantarflexion [facility] WFL  Ankle inversion    Ankle eversion     (Blank rows = not tested)  LOWER EXTREMITY MMT:    MMT Right Eval Left Eval  Hip flexion 4/5 4+/5  Hip extension    Hip abduction    Hip adduction    Hip internal rotation    Hip external rotation    Knee flexion 5/5 4+/5  Knee extension 5/5 4+/5  Ankle dorsiflexion 5/5 4+/5  Ankle plantarflexion At least 3/5 AROM At least 3/5 AROM  Ankle inversion    Ankle eversion    (Blank rows = not tested)  BED MOBILITY:  Pt reports sometimes needing assist (pulls self up on bed as needed)  TRANSFERS: Sit to stand: CGA   Assistive device utilized: Environmental Consultant - 2 wheeled     Stand to sit: CGA  Assistive device utilized: Environmental Consultant - 2 wheeled    ; vc's to line up to chair prior to sitting  GAIT: Findings: Gait Characteristics: L forefoot sliding on floor to advance (impaired L foot clearance); decreased L hip flexion and knee flexion to advance L LE; mild L knee flexion during L LE stance phase (no knee buckling noted); very narrow BOS; vc's to stay closer to walker during ambulation and within walker during turns, Distance walked: clinic distances, Assistive device utilized:Walker - 2 wheeled, Level of assistance: CGA, and Comments: decreased gait speed noted  FUNCTIONAL TESTS:  5 times sit to stand: 29.81 seconds (no UE support; use of some momentum and hands occasionally on thighs) 10 meter walk test: 0.44 m/sec (22.25 seconds with RW)  PATIENT SURVEYS:  TBA  VITALS: Vitals:   08/18/24 1414  BP: (!) 140/79  Pulse: 79  TREATMENT DATE: 08/18/24  Self Care: BP and HR taken in sitting at rest beginning of session (see below for details). Vitals:   08/18/24 1414  BP: (!) 140/79  Pulse: 79   Therapeutic Activity: BERG Balance Test: 23/56 (see below for detailed report) Item Test date: 08/18/24 Date:  Date:   Sitting to standing 3. able to stand independently using hands Insert SmartPhrase OPRCBERGREEVAL Insert SmartPhrase OPRCBERGREEVAL  2. Standing unsupported 3. able to stand 2 minutes with supervision; leaning to L at times but able to self correct    3. Sitting with back unsupported, feet supported 3. able to sit 2 minutes under supervision    4. Standing to sitting 2. uses back of legs against chair to control descent    5. Pivot transfer  2. able to transfer with verbal cuing and/or supervision    6. Standing unsupported with eyes closed 0. needs help to keep from falling; pt  requiring assist to prevent fall to L side    7. Standing unsupported with feet together 3. able to place feet together independently and stand 1 minute with supervision; pt leaning to L 1x but able to self correct    8. Reaching forward with outstretched arms while standing 0. loses balance while trying/requires external support    9. Pick up object from the floor from standing 3. able to pick up slipper but needs supervision    10. Turning to look behind over left and right shoulders while standing 3. looks behind one side only, other side shows less weight shift; decreased turn to L side    11. Turn 360 degrees 1. needs close supervision or verbal cuing 20.07 seconds total to R and L    12. Place alternate foot on step or stool while standing unsupported 0. needs assistance to keep from falling/unable to try    13. Standing unsupported one foot in front 0. loses balance while stepping or standing    14. Standing on one leg 0. unable to try of needs assist to prevent fall      Total Score 23/56 Total Score:    Total Score:    Therapeutic Exercise: Reviewed HEP Seated Long Arc Quad x5 reps B LE's Seated March x5 reps B LE's Seated Heel Raise x5 reps B LE's Seated Toe Raise x5 reps B LE's  Standing exercises with counter support (to improve LE strength): Hip aBduction: x10 reps B LE's (B UE support on counter) Notes: L LE CGA for safety;  R LE min assist for balance Heel raises: x10 reps B LE's simultaneously (B UE support on counter) Marching: x10 reps B LE's Notes: min assist for balance; L UE support on counter  PATIENT EDUCATION: Education details: Continue sitting HEP.  BERG Balance test results. Person educated: Patient and friend Education method: Explanation, Demonstration, Verbal cues, and Handouts Education comprehension: verbalized understanding, returned demonstration, verbal cues required, and needs further education  HOME EXERCISE PROGRAM: Access Code: [email or web address] URL:  [email or web address] Date: 08/08/2024 Prepared by: Damien Caulk  Exercises - Seated Long Arc Quad  - 1 x daily - 3-5 x weekly - 2-3 sets - 10 reps - Seated March  - 1 x daily - 3-5 x weekly - 2-3 sets - 10 reps - Seated Heel Raise  - 1 x daily - 3-5 x weekly - 2-3 sets - 10 reps - Seated Toe Raise  - 1 x daily - 3-5 x weekly - 2-3 sets - 10 reps  GOALS: Goals reviewed  with patient? Yes  SHORT TERM GOALS: Target date: 09/05/2024  Pt will be at least 50% compliant with initial HEP in order to improve strength and balance in order to decrease fall risk and improve function at home for ADL's. Baseline: Issued 08/08/24 Goal status: INITIAL  2.  Assess BERG Balance Test and update LTG as needed. Baseline: 23/56 Goal status: INITIAL  3.  Pt will decrease 5 Time Sit to Stand by at least 3 seconds in order to demonstrate clinically significant improvement in LE strength. Baseline: 29.81 seconds Goal status: INITIAL  4.  Pt will demonstrate improved walker use for safety with gait. Baseline: vc's to stay closer to RW during ambulation and within RW for turns Goal status: INITIAL  LONG TERM GOALS: Target date: 10/03/2024  Pt will be >50% compliant with HEP in order to improve strength and balance in order to decrease fall risk and improve function at home for ADL's. Baseline: Issued 08/08/24 Goal status: INITIAL  2.  Pt will decrease 5 Time Sit to Stand by at least 3 seconds in order to demonstrate clinically significant improvement in LE strength. Baseline: 29.81 seconds Goal status: INITIAL  3.  Pt will increase by at least 0.13 m/s in order to demonstrate clinically significant improvement in community ambulation.  Baseline: 0.44 m/sec Goal status: INITIAL  4.  Pt will improve BERG by at least 3 points in order to demonstrate clinically significant improvement in balance. Baseline: 23/56 Goal status: INITIAL  5.  Patient will deny any falls over past 4 weeks  to demonstrate improved safety awareness at home and work. Baseline: H/o fall AM of PT evaluation. Goal status: INITIAL  ASSESSMENT:  CLINICAL IMPRESSION: Patient was seen today for physical therapy treatment to addres outcome measure, HEP, and LE strengthening.  Focused session on reviewing HEP, assessing BERG Balance test, and standing LE strengthening.  Pt scored 23/56 on Berg Balance test indicating pt is at a high fall risk (<40/56 = high fall risk; 40-45/56 = moderate fall risk; >45/56 = low fall risk).  Patient continues to be limited by L sided weakness, balance, and impaired vision on L.  They demonstrate good insight into tendency to lean towards L side and tries to self correct when able (pt intermittently requiring assist for balance d/t loss of balance to L).  They would continue to benefit from skilled PT to address impairments as noted and progress towards long term goals. ***  OBJECTIVE IMPAIRMENTS: Abnormal gait, cardiopulmonary status limiting activity, decreased activity tolerance, decreased balance, decreased cognition, decreased coordination, decreased endurance, decreased knowledge of condition, decreased knowledge of use of DME, decreased mobility, difficulty walking, decreased strength, decreased safety awareness, impaired sensation, impaired tone, impaired UE functional use, improper body mechanics, and postural dysfunction.   ACTIVITY LIMITATIONS: carrying, lifting, bending, standing, squatting, stairs, transfers, bed mobility, bathing, toileting, dressing, hygiene/grooming, locomotion level, and caring for others  PARTICIPATION LIMITATIONS: meal prep, cleaning, laundry, driving, shopping, and community activity  PERSONAL FACTORS: Age, Education, Fitness, Past/current experiences, Transportation, and 1-2 comorbidities: htn are also affecting patient's functional outcome.   REHAB POTENTIAL: Good  CLINICAL DECISION MAKING: Evolving/moderate complexity  EVALUATION  COMPLEXITY: Moderate  PLAN:  PT FREQUENCY: 1x/week (pt reports only able to come 1x/week d/t difficulties with transportation and having assist available to come with him)  PT DURATION: 8 weeks  PLANNED INTERVENTIONS: [id number]- PT Re-evaluation, 97750- Physical Performance Testing, 97110-Therapeutic exercises, 97530- Therapeutic activity, [id number]- Neuromuscular re-education, 97535- Self Care, [id number]- Manual therapy, [id number]- Gait training,  02239- Orthotic Initial, [id number]- Orthotic/Prosthetic subsequent, [id number]- Aquatic Therapy, [phone]- Electrical stimulation (manual), Patient/Family education, Balance training, Stair training, Taping, Joint mobilization, Spinal mobilization, Vestibular training, Visual/preceptual remediation/compensation, Cognitive remediation, DME instructions, Cryotherapy, Moist heat, and Biofeedback  PLAN FOR NEXT SESSION:  Check on HEP.  Balance; LE strengthening; visual scanning to L.  Transfers and gait training.   Damien Caulk, PT 08/18/2024, 8:54 PM        "

## 2024-08-18 NOTE — Therapy (Signed)
 " OUTPATIENT OCCUPATIONAL THERAPY NEURO TREATMENT  Patient Name: Calvin Gardner MRN: [id number] DOB:[birth year], 74 y.o., male Today's Date: 08/18/2024  PCP: Delbert Clam, MD REFERRING PROVIDER: Donnamarie Lebron PARAS, MD  END OF SESSION:  OT End of Session - 08/18/24 1319     Visit Number 2    Number of Visits 9    Authorization Type UHC MCR/UHC DUAL-NO AUTH REQUIRED    OT Start Time 1315    OT Stop Time 1400    OT Time Calculation (min) 45 min    Activity Tolerance Patient tolerated treatment well    Behavior During Therapy [facility] for tasks assessed/performed          Past Medical History:  Diagnosis Date   Alcohol abuse 10/07/2017   Alcoholism (HCC)    Aortic atherosclerosis    Arthritis    Cancer (HCC)    liver   Cataract    Chronic hepatitis C without hepatic coma (HCC) 09/19/2014   GERD (gastroesophageal reflux disease)    Hepatitis C    Hepatocellular carcinoma (HCC) 06/09/2018   HLD (hyperlipidemia)    Hypertension    Liver mass    Pulmonary nodule    Substance abuse (HCC)    Past Surgical History:  Procedure Laterality Date   COLONOSCOPY WITH PROPOFOL  N/A 01/30/2014   Procedure: COLONOSCOPY WITH PROPOFOL ;  Surgeon: Gladis MARLA Louder, MD;  Location: [facility];  Service: Endoscopy;  Laterality: N/A;   ESOPHAGOGASTRODUODENOSCOPY (EGD) WITH PROPOFOL  N/A 01/30/2014   Procedure: ESOPHAGOGASTRODUODENOSCOPY (EGD) WITH PROPOFOL ;  Surgeon: Gladis MARLA Louder, MD;  Location: [facility];  Service: Endoscopy;  Laterality: N/A;   IR ANGIOGRAM SELECTIVE EACH ADDITIONAL VESSEL  07/19/2018   IR ANGIOGRAM SELECTIVE EACH ADDITIONAL VESSEL  07/19/2018   IR ANGIOGRAM SELECTIVE EACH ADDITIONAL VESSEL  07/19/2018   IR ANGIOGRAM SELECTIVE EACH ADDITIONAL VESSEL  07/19/2018   IR ANGIOGRAM SELECTIVE EACH ADDITIONAL VESSEL  07/19/2018   IR ANGIOGRAM VISCERAL SELECTIVE  07/19/2018   IR EMBO TUMOR ORGAN ISCHEMIA INFARCT INC GUIDE ROADMAPPING  07/19/2018   IR RADIOLOGIST EVAL & MGMT   07/07/2018   IR RADIOLOGIST EVAL & MGMT  08/16/2018   IR RADIOLOGIST EVAL & MGMT  01/31/2019   IR RADIOLOGIST EVAL & MGMT  04/25/2019   IR RADIOLOGIST EVAL & MGMT  07/20/2019   IR RADIOLOGIST EVAL & MGMT  10/17/2019   IR RADIOLOGIST EVAL & MGMT  01/30/2020   IR RADIOLOGIST EVAL & MGMT  08/14/2020   IR RADIOLOGIST EVAL & MGMT  02/24/2022   IR RADIOLOGIST EVAL & MGMT  08/11/2022   IR RADIOLOGIST EVAL & MGMT  04/27/2023   IR RADIOLOGIST EVAL & MGMT  10/28/2023   IR US  GUIDE VASC ACCESS LEFT  07/19/2018   left jaw surgery      MANDIBLE FRACTURE SURGERY     TRANSCAROTID ARTERY REVASCULARIZATION  Right 07/12/2024   Procedure: RIGHT TRANSCAROTID ARTERY REVASCULARIZATION;  Surgeon: Gretta Lonni PARAS, MD;  Location: [facility] OR;  Service: Vascular;  Laterality: Right;   ULTRASOUND GUIDANCE FOR VASCULAR ACCESS Left 07/12/2024   Procedure: ULTRASOUND GUIDANCE, FOR VASCULAR ACCESS;  Surgeon: Gretta Lonni PARAS, MD;  Location: [facility] OR;  Service: Vascular;  Laterality: Left;   Patient Active Problem List   Diagnosis Date Noted   Acute cerebrovascular accident (CVA) (HCC) 07/04/2024   Helicobacter pylori infection 10/14/2023   Hepatocellular carcinoma (HCC) 06/09/2018   Pulmonary nodule 05/25/2018   GERD (gastroesophageal reflux disease) 01/10/2018   Hiccough 10/07/2017   Alcohol abuse 10/07/2017  Chest pain 10/29/2015   Liver fibrosis (HCC) 10/23/2014   Chronic hepatitis C without hepatic coma (HCC) 09/19/2014   IFG (impaired fasting glucose) 04/02/2014   Vitamin D  deficiency 04/02/2014   Abnormal LFTs 04/02/2014   Cataract, right eye 01/05/2014   Essential hypertension, benign 01/05/2014   Tobacco abuse 01/05/2014    ONSET DATE: 07/10/2024  12/2/-07/15/24 hospitalization  REFERRING DIAG: I63.9 (ICD-10-CM) - Acute CVA (cerebrovascular accident) (HCC)  THERAPY DIAG:  Other lack of coordination  Visuospatial deficit  Muscle weakness (generalized)  Other symptoms and signs involving the  musculoskeletal system  Left-sided weakness  Other abnormalities of gait and mobility  Rationale for Evaluation and Treatment: Rehabilitation  SUBJECTIVE:   SUBJECTIVE STATEMENT: My thighs are sore, and my feet are dragging. Pt accompanied by: family member dropped him off  PERTINENT HISTORY: CT (+) acute territorial infarct, MRI (+) acute subacute ischemia in posterior R internal capsule, temporal lobe, small subacute infarct in R MCA motor strip. EEG showed encephalopathy and cortical dysfunction in L frontotemporal region likely 2/2 underlying structural abnormality. PMH ETOH abuse, aortic atherosclerosis, OA, Hep C, liver CA, cataract,HLD, HTN, Pulmonary nodule, substance abuse, smoker  PRECAUTIONS: Fall, hx of liver cancer no electrical modalities, L sided weakness and L visual field deficit  WEIGHT BEARING RESTRICTIONS: No  PAIN:  Are you having pain? Yes: NPRS scale: 6/10 Pain location: B thighs Pain description: sore Aggravating factors: walking Relieving factors: rest  FALLS: Has patient fallen in last 6 months? Yes. Number of falls multiple, fell in front room of clinic  LIVING ENVIRONMENT: Lives with: lives in a boarding home Lives in: House/apartment1 level Stairs: ramp Has following equipment at home: Vannie - 2 wheeled and Wheelchair (manual)  PLOF: Independent and Leisure: loves bike riding  PATIENT GOALS: To be able to do the things I was doing, like cooking.   OBJECTIVE:  Note: Objective measures were completed at Evaluation unless otherwise noted.  HAND DOMINANCE: Right  ADLs: Overall ADLs: Able to complete, but takes longer Transfers/ambulation related to ADLs: Eating: Independent Grooming: Independent UB Dressing: With prolonged time LB Dressing: With prolonged  Toileting: With prolonged time Bathing: With prolonged time Tub Shower transfers:  Equipment: stand in shower, lip to step over. Pt denies any grab bars, but states he does have a  board overhead that he holds onto  IADLs: Shopping: Not completing Light housekeeping: Making bed and keeping room clean Meal Prep: Not completing at this time Community mobility: was not driving prior to stroke, community transport Medication management: [employer] management: Has 2 bills rent and cable, which he pays. Handwriting: 100% legible pt reports that it was neater before stroke  MOBILITY STATUS: Hx of falls  POSTURE COMMENTS:  anterior pelvic tilt Sitting balance: WNL  ACTIVITY TOLERANCE: Activity tolerance: requires longer to complete basic self care tasks  FUNCTIONAL OUTCOME MEASURES: Upper Extremity Functional Scale (UEFS): 33/80, 58.75% impairment  UPPER EXTREMITY ROM:    Active ROM Right eval Left eval  Shoulder flexion    Shoulder abduction    Shoulder adduction    Shoulder extension    Shoulder internal rotation    Shoulder external rotation    Elbow flexion    Elbow extension    Wrist flexion    Wrist extension    Wrist ulnar deviation    Wrist radial deviation    Wrist pronation    Wrist supination    (Blank rows = not tested)  UPPER EXTREMITY MMT:  MMT Right eval Left eval  Shoulder flexion    Shoulder abduction    Shoulder adduction    Shoulder extension    Shoulder internal rotation    Shoulder external rotation    Middle trapezius    Lower trapezius    Elbow flexion    Elbow extension    Wrist flexion    Wrist extension    Wrist ulnar deviation    Wrist radial deviation    Wrist pronation    Wrist supination    (Blank rows = not tested)  HAND FUNCTION: Grip strength: Right: 75.7 lbs; Left: 57.3 lbs (R: 78.9, 77.1, 71.2 L: 59.9, 56.6, 55.5) COORDINATION: 9 Hole Peg test: Right: 41 sec; Left: 83 sec Box and Blocks:  Right 22 blocks, Left 14blocks  SENSATION: WFL  EDEMA: none  MUSCLE TONE: RUE: Within functional limits and LUE: Within functional limits  COGNITION: Overall cognitive status:  Within functional limits for tasks assessed  VISION: Subjective report: Per PT, pt has impaired vision on L side, pt confirmed this. Baseline vision: No visual deficits Visual history: cataracts  VISION ASSESSMENT: Visual Fields: Left visual field deficits  Patient has difficulty with following activities due to following visual impairments: walking  PERCEPTION: WFL  PRAXIS: Not tested  OBSERVATIONS: impaired FM coordination in L hand, grip strength, requiring increased time to complete ADLs, unable to complete some IADLs that he was completing before, impaired ability to grip things                                                                                                                             TREATMENT DATE: 08/18/24   - Therapeutic exercises completed for duration as noted below including: Educated pt in theraputty HEP to improve grip strength of L hand, utilized medium-light resistance red putty. Provided visual demo and verbal instruction for increased carryover. Pt returned demo, mod verbal cues to continue pinch and pull of putty. Also educated in vision HEP to improve visual tracking of L eye. See Pt instructions for handout provided.    PATIENT EDUCATION: Education details: Putty HEP, vision HEP Person educated: Patient Education method: Explanation, Demonstration, Verbal cues, and Handouts Education comprehension: verbalized understanding, returned demonstration, verbal cues required, and needs further education  HOME EXERCISE PROGRAM: 08/18/24: Putty HEP Access Code: [email or web address] URL: [email or web address]   Exercises - Putty Squeezes  - 1 x daily - 3 sets - 10 reps - Rolling Putty on Table  - 1 x daily - 3 sets - 10 reps - Finger Pinch and Pull with Putty  - 1 x daily - 2 sets - 10 reps - Tip Pinch with Putty  - 1 x daily - 2 sets - 10 reps - 3-Point Pinch with Putty  - 1 x daily - 2 sets - 10 reps - Key Pinch with Putty  - 1 x daily - 2  sets - 10 reps - Removing Marbles from Putty  - 1 x  daily - 1 reps  GOALS: Goals reviewed with patient? Yes  SHORT TERM GOALS: Target date: 09/08/24  Patient will demonstrate updated LUE HEP with 25% verbal cues or less for proper execution.  Baseline: New to OP OT Goal status: INITIAL  2.  Patient will independently recall at least 2 compensatory strategies for visual impairment without cueing.  Baseline: New to OP OT Goal status: INITIAL  3.  Pt will verbalize understanding of adapted strategies and/or equipment PRN to increase safety and independence with ADLs and IADLs (I.e. button hook, jar openers/bottle openers, elastic shoe laces or one handed shoe tying technique)  Baseline: Pt reports difficulty tying shoes, doing up buttons, and getting belt under all loops of pants  Goal status: INITIAL  4.  Patient will demonstrate at least 67 lbs L grip strength as needed to open jars and other containers.  Baseline: Right: 75.7 avg lbs; Left: 57.3 avg lbs Goal status: INITIAL    LONG TERM GOALS: Target date: 11/06/24  Pt will increase UEFS score to at least 45/80 to demonstrate improved function Baseline: 33/80, 58.75% impairment Goal status: INITIAL  2.  Pt will be able to place at least 20 blocks using left hand and 30 blocks with R hand with completion of Box and Blocks test.  Baseline: Right 22 blocks, Left 14blocks Goal status: INITIAL  3.  Patient will demo improved FM coordination as evidenced by completing nine-hole peg with use of L hand in 60 seconds or less.  Baseline: 9 Hole Peg test: Right: 41 sec; Left: 83 sec Goal status: INITIAL  4.  Patient will independently use visual compensatory strategies for locating at least 14/15 objects without cueing in visual distracting environment.  Baseline: Difficulty locating objects especially on L side Goal status: INITIAL   ASSESSMENT:  CLINICAL IMPRESSION: Patient is a 74 y.o. male who was seen today for  occupational therapy tx for acute CVA. Hx includes PMH ETOH abuse, aortic atherosclerosis, OA, Hep C, liver CA, cataract,HLD, HTN, Pulmonary nodule, substance abuse, smoker. Pt demosntrated fair understanding of therapeutic exercises issued this date, mod cues were required d/t perseverating tendencies. Patient currently presents below baseline level of functioning demonstrating functional deficits and impairments as noted below. Pt would benefit from skilled OT services in the outpatient setting to work on impairments as noted below to help pt return to PLOF as able.    PERFORMANCE DEFICITS: in functional skills including ADLs, IADLs, coordination, dexterity, balance, vision, and UE functional use, cognitive skills including safety awareness, and psychosocial skills including environmental adaptation, habits, and routines and behaviors.   IMPAIRMENTS: are limiting patient from ADLs, IADLs, work, leisure, and social participation.   CO-MORBIDITIES: may have co-morbidities  that affects occupational performance. Patient will benefit from skilled OT to address above impairments and improve overall function.  MODIFICATION OR ASSISTANCE TO COMPLETE EVALUATION: Min-Moderate modification of tasks or assist with assess necessary to complete an evaluation.  OT OCCUPATIONAL PROFILE AND HISTORY: Detailed assessment: Review of records and additional review of physical, cognitive, psychosocial history related to current functional performance.  CLINICAL DECISION MAKING: Moderate - several treatment options, min-mod task modification necessary  REHAB POTENTIAL: Good  EVALUATION COMPLEXITY: Moderate    PLAN:  OT FREQUENCY: 1x/week  OT DURATION: 8 weeks  PLANNED INTERVENTIONS: 97168 OT Re-evaluation, 97535 self care/ADL training, 02889 therapeutic exercise, 97530 therapeutic activity, 97112 neuromuscular re-education, 97140 manual therapy, passive range of motion, visual/perceptual  remediation/compensation, energy conservation, coping strategies training, patient/family education, and DME and/or AE instructions  RECOMMENDED OTHER SERVICES: none at this time; received PT eval. Monitor if speech referral needed.  CONSULTED AND AGREED WITH PLAN OF CARE: Patient  PLAN FOR NEXT SESSION: Visual compensatory strategies Coordination HEP    Rocky Dutch, OT 08/18/2024, 1:59 PM           "

## 2024-08-18 NOTE — Patient Instructions (Addendum)
 Vision HEP:  Perform at least 3 times per day. Stop if your eye becomes fatigued or hurts and try again later.  1. Hold a small object/card in front of you.  Hold it in the middle at arm's length away.    2. Slowly move the object side to side in front of you while continuing to watch it with your eyes.   3.  Remember to keep your head still and only move your eyes.  4.  Repeat 5-10 times.  5.  Then, move object up and down while watching it 5-10 times.

## 2024-08-21 ENCOUNTER — Other Ambulatory Visit: Payer: Self-pay

## 2024-08-22 ENCOUNTER — Ambulatory Visit: Attending: Family Medicine

## 2024-08-22 ENCOUNTER — Other Ambulatory Visit: Payer: Self-pay

## 2024-08-22 ENCOUNTER — Telehealth: Payer: Self-pay | Admitting: *Deleted

## 2024-08-22 ENCOUNTER — Encounter: Payer: Self-pay | Admitting: *Deleted

## 2024-08-23 ENCOUNTER — Encounter: Payer: Self-pay | Admitting: *Deleted

## 2024-08-24 ENCOUNTER — Ambulatory Visit: Admitting: Physical Therapy

## 2024-08-24 ENCOUNTER — Encounter: Payer: Self-pay | Admitting: *Deleted

## 2024-08-24 ENCOUNTER — Ambulatory Visit

## 2024-08-28 ENCOUNTER — Ambulatory Visit: Payer: Self-pay

## 2024-08-28 NOTE — Telephone Encounter (Signed)
 Noted with thanks. Scheduled on 09/01/24 at 1610 with Ronal Jenkins Houseman, NP  Office is closed today for in-person appointments due to inclement weather.   Will send to PCP as RICK

## 2024-08-28 NOTE — Telephone Encounter (Signed)
 FYI Only or Action Required?: FYI only for provider: appointment scheduled on 09/01/24.  Patient was last seen in primary care on 07/26/2024 by Placey, Ronal Caldron, NP.  Called Nurse Triage reporting Fall.  Symptoms began several days ago.  Interventions attempted: Nothing.  Symptoms are: stable.  Triage Disposition: See PCP When Office is Open (Within 3 Days)  Patient/caregiver understands and will follow disposition?: Yes    Reason for Disposition  MILD weakness (e.g., does not interfere with ability to work, go to school, normal activities)  (Exception: Mild weakness is a chronic symptom.)  Answer Assessment - Initial Assessment Questions Patient's son Zerome called saying his dad has been falling. Patient got on the phone and verified information, gave permission to speak to his son. Patient said he fell last on Tuesday, but also almost fell this morning. His son says the uncle said they helped him off the floor this morning. He was going to the bathroom and fell on his knees. Patient reported no injury today, but says from the previous falls he has a knot on his left elbow, swelling on the left side and his toes are swollen. He says he does get dizzy when grabbing the walker, he's losing his balance. No pain or dizziness at this time. His son is saying the patient needs to be at a rehab facility or someone come to the home for rehab to help him get back to walking without a walker. He says he needs help with transportation. Son says he notices some confusion when he's speaking to his dad, so that needs to be discussed as well. Patient says he's been using cabs to take him to the appointments, but that is money each time and he's missed appointments for PT/OT due to no transportation. Advised son to come with this dad to an appointment with provider to discuss these concerns regarding rehab, he agreed. Scheduled on 09/01/24 at 1610. Zerome says he will try his best to get him there at least by  4:30pm, depending on if he has to help him get ready when he gets there from work. Advised the late policy 10 minutes late will need to reschedule, so advised to call the office if unable to make that appointment. He verbalized understanding. Called him back to provide a number for [facility] transportation, [phone] to call to set up transportation for PT/OT appointments this week, advised of all upcoming appointments and location this week.    1. MECHANISM: How did the fall happen?     Lost balance  2. DOMESTIC VIOLENCE AND ELDER ABUSE SCREENING: Did you fall because someone pushed you or tried to hurt you? If Yes, ask: Are you safe now?     No  3. ONSET: When did the fall happen? (e.g., minutes, hours, or days ago)     This morning  4. LOCATION: What part of the body hit the ground? (e.g., back, buttocks, head, hips, knees, hands, head, stomach)     On his knees  5. INJURY: Did you hurt (injure) yourself when you fell? If Yes, ask: What did you injure? Tell me more about this? (e.g., body area; type of injury; pain severity)     No injury today  6. PAIN: Is there any pain? If Yes, ask: How bad is the pain? (e.g., Scale 0-10; or none, mild,      No  7. SIZE: For cuts, bruises, or swelling, ask: How large is it? (e.g., inches or centimeters)  Swelling to the left side, toes  9. OTHER SYMPTOMS: Do you have any other symptoms? (e.g., dizziness, fever, weakness; new-onset or worsening).      Dizziness when grab the walker   10. CAUSE: What do you think caused the fall (or falling)? (e.g., dizzy spell, tripped)       Unbalanced  Protocols used: Falls and Falling-A-AH  Summary: Hip pain, Leg numbness   Reason for Triage: Pt son calling, Zerome, reports pt has been having episodes of increased falling, difficulty walking, having increased leg weakness, numbness, and hip pain.  Pt son is concerned, and would like to know if pt can get assistance at home  care facility.

## 2024-08-30 ENCOUNTER — Ambulatory Visit (HOSPITAL_COMMUNITY)
Admission: RE | Admit: 2024-08-30 | Discharge: 2024-08-30 | Disposition: A | Discharge status: Home / Self Care | Source: Ambulatory Visit | Attending: Vascular Surgery | Admitting: Vascular Surgery

## 2024-08-30 ENCOUNTER — Ambulatory Visit (INDEPENDENT_AMBULATORY_CARE_PROVIDER_SITE_OTHER): Admitting: Physician Assistant

## 2024-08-30 ENCOUNTER — Encounter: Payer: Self-pay | Admitting: Physician Assistant

## 2024-08-30 VITALS — BP 127/83 | HR 90 | Temp 98.7°F | Resp 22 | Ht 66.0 in | Wt 120.0 lb

## 2024-08-30 DIAGNOSIS — I6529 Occlusion and stenosis of unspecified carotid artery: Secondary | ICD-10-CM | POA: Diagnosis not present

## 2024-08-30 NOTE — Progress Notes (Signed)
" °  POST OPERATIVE OFFICE NOTE    CC:  F/u for surgery  HPI:  Calvin Gardner is a 74 y.o. male who is here for a postop visit.  He recently underwent right TCAR on 07/12/2024 by Dr. Gretta.  This was done for symptomatic right ICA stenosis with left lower extremity weakness.  He returns today for follow-up.  He has no complaints at today's office visit.  He denies any new or worsening strokelike symptoms.  He still has residual left leg weakness from his stroke.  He denies any issues with his right sided neck incision such as drainage, swelling, or tenderness.  He is taking his aspirin , Plavix , and statin.   Allergies[1]  Current Outpatient Medications  Medication Sig Dispense Refill   acetaminophen  (TYLENOL ) 500 MG tablet Take 2 tablets (1,000 mg total) by mouth in the morning. 30 tablet 1   amLODipine  (NORVASC ) 5 MG tablet Take 1 tablet (5 mg total) by mouth daily. 90 tablet 1   aspirin  81 MG chewable tablet Chew 1 tablet (81 mg total) by mouth daily. 90 tablet 1   atorvastatin  (LIPITOR) 20 MG tablet Take 1 tablet (20 mg total) by mouth daily. 90 tablet 1   clopidogrel  (PLAVIX ) 75 MG tablet Take 1 tablet by mouth daily 28 tablet 1   folic acid  (FOLVITE ) 1 MG tablet Take 1 tablet (1 mg total) by mouth daily. 90 tablet 1   Multiple Vitamin (MULTIVITAMIN WITH MINERALS) TABS tablet Take 1 tablet by mouth daily. 90 tablet 1   thiamine  (VITAMIN B1) 100 MG tablet Take 1 tablet (100 mg total) by mouth daily. 90 tablet 1   No current facility-administered medications for this visit.    ROS:  See HPI  Physical Exam:   Incision: Well-healed right sided neck incision without hematoma or drainage Extremities: Palpable radial pulses bilaterally Neuro: Alert and oriented x 3, left leg weakness  Studies: Carotid duplex (08/30/2024) Patent right carotid artery stent without stenosis.  1 to 39% left ICA stenosis.   Assessment/Plan:  This is a 74 y.o. male who is here for follow-up  - The  patient recently underwent right TCAR on 07/12/2024 for symptomatic carotid stenosis.  His right sided neck incision is well-healed -He denies any new or worsening strokelike symptoms such as slurred speech, facial droop, sudden weakness/numbness, or sudden visual changes.  He does have significant residual left leg weakness from his previous stroke -Carotid duplex demonstrates a widely patent right carotid stent without stenosis.  He has left ICA 1 to 39% stenosis -On exam he is alert and oriented x 3.  He has intact motor and sensation of his right upper and lower extremities.  He does have notable left lower extremity weakness at baseline. - I have encouraged the patient to continue taking his aspirin , Plavix , and statin.  He can follow-up with our office in 9 months with repeat carotid duplex   Ahmed Holster, PA-C [facility] [phone]   Clinic MD: Sheree    [1] No Known Allergies  "

## 2024-08-31 ENCOUNTER — Encounter: Payer: Self-pay | Admitting: Physical Therapy

## 2024-08-31 ENCOUNTER — Telehealth: Payer: Self-pay | Admitting: Physical Therapy

## 2024-08-31 ENCOUNTER — Ambulatory Visit

## 2024-08-31 ENCOUNTER — Ambulatory Visit: Admitting: Physical Therapy

## 2024-08-31 VITALS — BP 148/79 | HR 84

## 2024-08-31 DIAGNOSIS — I69354 Hemiplegia and hemiparesis following cerebral infarction affecting left non-dominant side: Secondary | ICD-10-CM

## 2024-08-31 DIAGNOSIS — R278 Other lack of coordination: Secondary | ICD-10-CM

## 2024-08-31 DIAGNOSIS — M6281 Muscle weakness (generalized): Secondary | ICD-10-CM

## 2024-08-31 DIAGNOSIS — R531 Weakness: Secondary | ICD-10-CM

## 2024-08-31 DIAGNOSIS — R41842 Visuospatial deficit: Secondary | ICD-10-CM

## 2024-08-31 DIAGNOSIS — R29898 Other symptoms and signs involving the musculoskeletal system: Secondary | ICD-10-CM

## 2024-08-31 DIAGNOSIS — Z9181 History of falling: Secondary | ICD-10-CM

## 2024-08-31 DIAGNOSIS — R2689 Other abnormalities of gait and mobility: Secondary | ICD-10-CM | POA: Diagnosis not present

## 2024-08-31 NOTE — Telephone Encounter (Signed)
 Referral has been placed.

## 2024-08-31 NOTE — Therapy (Signed)
 " OUTPATIENT OCCUPATIONAL THERAPY NEURO TREATMENT AND DISCHARGE  Patient Name: Calvin Gardner MRN: [id number] DOB:1950/11/29, 74 y.o., male Today's Date: 08/31/2024  PCP: Delbert Clam, MD REFERRING PROVIDER: Donnamarie Lebron PARAS, MD OCCUPATIONAL THERAPY DISCHARGE SUMMARY  Visits from Start of Care: Including eval pt received 3 visits from 08/08/24-08/31/24.  Current functional level related to goals / functional outcomes: Pt met no STGs or LTGs.   Remaining deficits: L sided weakness, poor safety awareness/impulsivity, L visual field deficit, poor LUE FM Coordination    Education / Equipment: Educated in AE for improved ease in dressing tasks, visual HEP for improving tracking, visual compensatory strategies, benefits of HH therapy.  Patient agrees to discharge. Patient goals were not met. Patient is being discharged due to being more appropriate for [facility] therapy and lack of progress.     END OF SESSION:  OT End of Session - 08/31/24 1319     Visit Number 3    Number of Visits 9    Authorization Type UHC MCR/UHC DUAL-NO AUTH REQUIRED    OT Start Time 1305    OT Stop Time 1400    OT Time Calculation (min) 55 min    Activity Tolerance Patient tolerated treatment well    Behavior During Therapy [facility] for tasks assessed/performed          Past Medical History:  Diagnosis Date   Alcohol abuse 10/07/2017   Alcoholism (HCC)    Aortic atherosclerosis    Arthritis    Cancer (HCC)    liver   Cataract    Chronic hepatitis C without hepatic coma (HCC) 09/19/2014   GERD (gastroesophageal reflux disease)    Hepatitis C    Hepatocellular carcinoma (HCC) 06/09/2018   HLD (hyperlipidemia)    Hypertension    Liver mass    Pulmonary nodule    Substance abuse (HCC)    Past Surgical History:  Procedure Laterality Date   COLONOSCOPY WITH PROPOFOL  N/A 01/30/2014   Procedure: COLONOSCOPY WITH PROPOFOL ;  Surgeon: Gladis MARLA Louder, MD;  Location: [facility];  Service: Endoscopy;   Laterality: N/A;   ESOPHAGOGASTRODUODENOSCOPY (EGD) WITH PROPOFOL  N/A 01/30/2014   Procedure: ESOPHAGOGASTRODUODENOSCOPY (EGD) WITH PROPOFOL ;  Surgeon: Gladis MARLA Louder, MD;  Location: [facility];  Service: Endoscopy;  Laterality: N/A;   IR ANGIOGRAM SELECTIVE EACH ADDITIONAL VESSEL  07/19/2018   IR ANGIOGRAM SELECTIVE EACH ADDITIONAL VESSEL  07/19/2018   IR ANGIOGRAM SELECTIVE EACH ADDITIONAL VESSEL  07/19/2018   IR ANGIOGRAM SELECTIVE EACH ADDITIONAL VESSEL  07/19/2018   IR ANGIOGRAM SELECTIVE EACH ADDITIONAL VESSEL  07/19/2018   IR ANGIOGRAM VISCERAL SELECTIVE  07/19/2018   IR EMBO TUMOR ORGAN ISCHEMIA INFARCT INC GUIDE ROADMAPPING  07/19/2018   IR RADIOLOGIST EVAL & MGMT  07/07/2018   IR RADIOLOGIST EVAL & MGMT  08/16/2018   IR RADIOLOGIST EVAL & MGMT  01/31/2019   IR RADIOLOGIST EVAL & MGMT  04/25/2019   IR RADIOLOGIST EVAL & MGMT  07/20/2019   IR RADIOLOGIST EVAL & MGMT  10/17/2019   IR RADIOLOGIST EVAL & MGMT  01/30/2020   IR RADIOLOGIST EVAL & MGMT  08/14/2020   IR RADIOLOGIST EVAL & MGMT  02/24/2022   IR RADIOLOGIST EVAL & MGMT  08/11/2022   IR RADIOLOGIST EVAL & MGMT  04/27/2023   IR RADIOLOGIST EVAL & MGMT  10/28/2023   IR US  GUIDE VASC ACCESS LEFT  07/19/2018   left jaw surgery      MANDIBLE FRACTURE SURGERY     TRANSCAROTID ARTERY REVASCULARIZATION  Right  07/12/2024   Procedure: RIGHT TRANSCAROTID ARTERY REVASCULARIZATION;  Surgeon: Gretta Lonni PARAS, MD;  Location: [facility] OR;  Service: Vascular;  Laterality: Right;   ULTRASOUND GUIDANCE FOR VASCULAR ACCESS Left 07/12/2024   Procedure: ULTRASOUND GUIDANCE, FOR VASCULAR ACCESS;  Surgeon: Gretta Lonni PARAS, MD;  Location: [facility] OR;  Service: Vascular;  Laterality: Left;   Patient Active Problem List   Diagnosis Date Noted   Acute cerebrovascular accident (CVA) (HCC) 07/04/2024   Helicobacter pylori infection 10/14/2023   Hepatocellular carcinoma (HCC) 06/09/2018   Pulmonary nodule 05/25/2018   GERD (gastroesophageal reflux  disease) 01/10/2018   Hiccough 10/07/2017   Alcohol abuse 10/07/2017   Chest pain 10/29/2015   Liver fibrosis (HCC) 10/23/2014   Chronic hepatitis C without hepatic coma (HCC) 09/19/2014   IFG (impaired fasting glucose) 04/02/2014   Vitamin D  deficiency 04/02/2014   Abnormal LFTs 04/02/2014   Cataract, right eye 01/05/2014   Essential hypertension, benign 01/05/2014   Tobacco abuse 01/05/2014    ONSET DATE: 07/10/2024  12/2/-07/15/24 hospitalization  REFERRING DIAG: I63.9 (ICD-10-CM) - Acute CVA (cerebrovascular accident) (HCC)  THERAPY DIAG:  Other lack of coordination  Visuospatial deficit  Muscle weakness (generalized)  Other symptoms and signs involving the musculoskeletal system  Left-sided weakness  Hemiplegia and hemiparesis following cerebral infarction affecting left non-dominant side (HCC)  Rationale for Evaluation and Treatment: Rehabilitation  SUBJECTIVE:   SUBJECTIVE STATEMENT: I've been having some bad luck, I've had a lot of falls.  Pt accompanied by: self, dropped off   PERTINENT HISTORY: CT (+) acute territorial infarct, MRI (+) acute subacute ischemia in posterior R internal capsule, temporal lobe, small subacute infarct in R MCA motor strip. EEG showed encephalopathy and cortical dysfunction in L frontotemporal region likely 2/2 underlying structural abnormality. PMH ETOH abuse, aortic atherosclerosis, OA, Hep C, liver CA, cataract,HLD, HTN, Pulmonary nodule, substance abuse, smoker  PRECAUTIONS: Fall, hx of liver cancer no electrical modalities, L sided weakness and L visual field deficit  WEIGHT BEARING RESTRICTIONS: No  PAIN:  Are you having pain? Yes: NPRS scale: 6/10 Pain location: B thighs Pain description: sore Aggravating factors: walking Relieving factors: rest  FALLS: Has patient fallen in last 6 months? Yes. Number of falls multiple, fell in front room of clinic  LIVING ENVIRONMENT: Lives with: lives in a boarding home Lives  in: House/apartment1 level Stairs: ramp Has following equipment at home: Vannie - 2 wheeled and Wheelchair (manual)  PLOF: Independent and Leisure: loves bike riding  PATIENT GOALS: To be able to do the things I was doing, like cooking.   OBJECTIVE:  Note: Objective measures were completed at Evaluation unless otherwise noted.  HAND DOMINANCE: Right  ADLs: Overall ADLs: Able to complete, but takes longer Transfers/ambulation related to ADLs: Eating: Independent Grooming: Independent UB Dressing: With prolonged time LB Dressing: With prolonged  Toileting: With prolonged time Bathing: With prolonged time Tub Shower transfers:  Equipment: stand in shower, lip to step over. Pt denies any grab bars, but states he does have a board overhead that he holds onto  IADLs: Shopping: Not completing Light housekeeping: Making bed and keeping room clean Meal Prep: Not completing at this time Community mobility: was not driving prior to stroke, community transport Medication management: [employer] management: Has 2 bills rent and cable, which he pays. Handwriting: 100% legible pt reports that it was neater before stroke  MOBILITY STATUS: Hx of falls  POSTURE COMMENTS:  anterior pelvic tilt Sitting balance: WNL  ACTIVITY TOLERANCE: Activity tolerance: requires longer  to complete basic self care tasks  FUNCTIONAL OUTCOME MEASURES: Upper Extremity Functional Scale (UEFS): 33/80, 58.75% impairment  UPPER EXTREMITY ROM:    Active ROM Right eval Left eval  Shoulder flexion    Shoulder abduction    Shoulder adduction    Shoulder extension    Shoulder internal rotation    Shoulder external rotation    Elbow flexion    Elbow extension    Wrist flexion    Wrist extension    Wrist ulnar deviation    Wrist radial deviation    Wrist pronation    Wrist supination    (Blank rows = not tested)  UPPER EXTREMITY MMT:     MMT Right eval Left eval  Shoulder flexion     Shoulder abduction    Shoulder adduction    Shoulder extension    Shoulder internal rotation    Shoulder external rotation    Middle trapezius    Lower trapezius    Elbow flexion    Elbow extension    Wrist flexion    Wrist extension    Wrist ulnar deviation    Wrist radial deviation    Wrist pronation    Wrist supination    (Blank rows = not tested)  HAND FUNCTION: Grip strength: Right: 75.7 lbs; Left: 57.3 lbs 08/31/24: Right=77.4 lbs avg; Left: 44.1 lbs avg  COORDINATION: 9 Hole Peg test: Right: 41 sec; Left: 83 sec Box and Blocks:  Right 22 blocks, Left 14blocks 08/31/24 9HPT NT d/t time constraints 08/31/24: Box and Blocks Right 15 blocks (dropped 3), Left 3 blocks (dropped 1) SENSATION: WFL  EDEMA: none  MUSCLE TONE: RUE: Within functional limits and LUE: Within functional limits  COGNITION: Overall cognitive status: Within functional limits for tasks assessed  VISION: Subjective report: Per PT, pt has impaired vision on L side, pt confirmed this. Baseline vision: No visual deficits Visual history: cataracts  VISION ASSESSMENT: Visual Fields: Left visual field deficits  Patient has difficulty with following activities due to following visual impairments: walking  PERCEPTION: WFL  PRAXIS: Not tested  OBSERVATIONS: impaired FM coordination in L hand, grip strength, requiring increased time to complete ADLs, unable to complete some IADLs that he was completing before, impaired ability to grip things                                                                                                                             TREATMENT DATE:08/31/24  - Self-care/home management completed for duration as noted below including:   Discussed with pt about discharge and being referred for [facility] therapy, pt reports his son will be discussing with his PCP tomorrow about obtaining a referral to a living facility. Discussed with pt about possibility of delay between  referral request and actual placement and that [facility] would be beneficial for improving safety in the home (pt has been reporting increased falls in the home). Pt in agreement. Obtained Box and Blocks and grip  strength this date, unable to obtain all measurements d/t time constraints.  Pt arrived to facility with gauze on his L eyebrow and medical tape d/t having wound in this area from a fall on Friday. Pt requested changing. OT gently removed gauze and tape, however tape snagged small wound and this began to bleed (pt on blood thinners). Applied ice to cervicothoracic area  and tight gauze around pt's head (applied pressure with gauze and ice compress while assembling gauze around head). Pt reported that feels good.   Also discussed visual compensatory strategies and reviewed vision HEP, pt reported completing this at home. See Pt instructions for visual compensatory strategies.  Educated pt in button hook use for improved ease with fastening/unfastening buttons. Wrote down where to obtain this AE locally. Mod cues required for using L hand and to scan via lighthouse method for proper vision.    PATIENT EDUCATION: Education details: Benefits of HH therapy, button hook use, vision compensatory strategies. Person educated: Patient Education method: Explanation, Demonstration, and Verbal cues Education comprehension: verbalized understanding, returned demonstration, verbal cues required, and needs further education  HOME EXERCISE PROGRAM: 08/18/24: Putty HEP Access Code: [email or web address] URL: [email or web address]   Exercises - Putty Squeezes  - 1 x daily - 3 sets - 10 reps - Rolling Putty on Table  - 1 x daily - 3 sets - 10 reps - Finger Pinch and Pull with Putty  - 1 x daily - 2 sets - 10 reps - Tip Pinch with Putty  - 1 x daily - 2 sets - 10 reps - 3-Point Pinch with Putty  - 1 x daily - 2 sets - 10 reps - Key Pinch with Putty  - 1 x daily - 2 sets - 10 reps - Removing Marbles from  Putty  - 1 x daily - 1 reps  GOALS: Goals reviewed with patient? Yes  SHORT TERM GOALS: Target date: 09/08/24  Patient will demonstrate updated LUE HEP with 25% verbal cues or less for proper execution.  Baseline: New to OP OT Goal status: NOT MET  2.  Patient will independently recall at least 2 compensatory strategies for visual impairment without cueing.  Baseline: New to OP OT Goal status: NOT MET   3.  Pt will verbalize understanding of adapted strategies and/or equipment PRN to increase safety and independence with ADLs and IADLs (I.e. button hook, jar openers/bottle openers, elastic shoe laces or one handed shoe tying technique)  Baseline: Pt reports difficulty tying shoes, doing up buttons, and getting belt under all loops of pants  08/31/24: Educated in button hook  Goal status: NOT MET   4.  Patient will demonstrate at least 67 lbs L grip strength as needed to open jars and other containers.  Baseline: Right: 75.7 avg lbs; Left: 57.3 avg lbs 08/31/24: Right=77.4 lbs avg; Left: 44.1 lbs avg Goal status: NOT MET     LONG TERM GOALS: Target date: 11/06/24  Pt will increase UEFS score to at least 45/80 to demonstrate improved function Baseline: 33/80, 58.75% impairment 08/31/24: Unable to obtain new measurement d/t time constraints Goal status: NOT MET   2.  Pt will be able to place at least 20 blocks using left hand and 30 blocks with R hand with completion of Box and Blocks test.  Baseline: Right 22 blocks, Left 14blocks 08/31/24: Box and Blocks Right 15 blocks (dropped 3), Left 3 blocks (dropped 1) Goal status: NOT MET   3.  Patient will demo improved FM coordination as evidenced  by completing nine-hole peg with use of L hand in 60 seconds or less.  Baseline: 9 Hole Peg test: Right: 41 sec; Left: 83 sec 08/31/24: Unable to obtain new measurement d/t time constraints Goal status: NOT MET  4.  Patient will independently use visual compensatory strategies for locating at  least 14/15 objects without cueing in visual distracting environment.  Baseline: Difficulty locating objects especially on L side 08/31/24: Mod cueing required Goal status: NOT MET    ASSESSMENT:  CLINICAL IMPRESSION: Patient is a 74 y.o. male who was seen today for occupational therapy tx for acute CVA. Hx includes PMH ETOH abuse, aortic atherosclerosis, OA, Hep C, liver CA, cataract,HLD, HTN, Pulmonary nodule, substance abuse, smoker. Pt has been having increased falls in the home, and is going to be receiving referral for facility placement for improved safety. It is of this Ot's clinical judgment that pt would benefit from [facility] OT services to ensure safety in the home and prepare for placement in facility.  PERFORMANCE DEFICITS: in functional skills including ADLs, IADLs, coordination, dexterity, balance, vision, and UE functional use, cognitive skills including safety awareness, and psychosocial skills including environmental adaptation, habits, and routines and behaviors.   IMPAIRMENTS: are limiting patient from ADLs, IADLs, work, leisure, and social participation.   CO-MORBIDITIES: may have co-morbidities  that affects occupational performance. Patient will benefit from skilled OT to address above impairments and improve overall function.      Rocky Dutch, OT 08/31/2024, 1:19 PM           "

## 2024-08-31 NOTE — Telephone Encounter (Signed)
 Dr. Newlin,  Calvin Gardner was evaluated by outpatient physical therapy (and occupational therapy) on 08/08/24.  The patient would benefit from referral for home health PT and OT services.   If you agree, please place an order for Home Health PT and OT (evaluation and treatment for CVA).  Thank you, Damien Caulk, PT  [facility] [address] [place], [place]  [address] Phone:  [phone] Fax:  [phone]

## 2024-08-31 NOTE — Patient Instructions (Addendum)
 Calvin Gardner

## 2024-08-31 NOTE — Addendum Note (Signed)
 Addended by: Keni Elison on: 08/31/2024 05:52 PM   Modules accepted: Orders

## 2024-09-01 ENCOUNTER — Ambulatory Visit: Payer: Self-pay

## 2024-09-01 ENCOUNTER — Other Ambulatory Visit: Payer: Self-pay

## 2024-09-01 ENCOUNTER — Emergency Department (HOSPITAL_COMMUNITY)

## 2024-09-01 ENCOUNTER — Encounter (HOSPITAL_COMMUNITY): Payer: Self-pay

## 2024-09-01 ENCOUNTER — Emergency Department (HOSPITAL_COMMUNITY)
Admission: EM | Admit: 2024-09-01 | Discharge: 2024-09-02 | Disposition: A | Attending: Emergency Medicine | Admitting: Emergency Medicine

## 2024-09-01 ENCOUNTER — Ambulatory Visit: Payer: Self-pay | Admitting: *Deleted

## 2024-09-01 DIAGNOSIS — I6523 Occlusion and stenosis of bilateral carotid arteries: Secondary | ICD-10-CM | POA: Insufficient documentation

## 2024-09-01 DIAGNOSIS — Z7902 Long term (current) use of antithrombotics/antiplatelets: Secondary | ICD-10-CM | POA: Insufficient documentation

## 2024-09-01 DIAGNOSIS — M47816 Spondylosis without myelopathy or radiculopathy, lumbar region: Secondary | ICD-10-CM | POA: Insufficient documentation

## 2024-09-01 DIAGNOSIS — G319 Degenerative disease of nervous system, unspecified: Secondary | ICD-10-CM | POA: Insufficient documentation

## 2024-09-01 DIAGNOSIS — M47812 Spondylosis without myelopathy or radiculopathy, cervical region: Secondary | ICD-10-CM | POA: Insufficient documentation

## 2024-09-01 DIAGNOSIS — M542 Cervicalgia: Secondary | ICD-10-CM | POA: Insufficient documentation

## 2024-09-01 DIAGNOSIS — M79605 Pain in left leg: Secondary | ICD-10-CM | POA: Insufficient documentation

## 2024-09-01 DIAGNOSIS — M160 Bilateral primary osteoarthritis of hip: Secondary | ICD-10-CM | POA: Insufficient documentation

## 2024-09-01 DIAGNOSIS — Z7901 Long term (current) use of anticoagulants: Secondary | ICD-10-CM | POA: Insufficient documentation

## 2024-09-01 DIAGNOSIS — R519 Headache, unspecified: Secondary | ICD-10-CM | POA: Insufficient documentation

## 2024-09-01 DIAGNOSIS — Z7982 Long term (current) use of aspirin: Secondary | ICD-10-CM | POA: Insufficient documentation

## 2024-09-01 DIAGNOSIS — W19XXXA Unspecified fall, initial encounter: Secondary | ICD-10-CM | POA: Insufficient documentation

## 2024-09-01 DIAGNOSIS — I10 Essential (primary) hypertension: Secondary | ICD-10-CM | POA: Insufficient documentation

## 2024-09-01 DIAGNOSIS — N400 Enlarged prostate without lower urinary tract symptoms: Secondary | ICD-10-CM | POA: Insufficient documentation

## 2024-09-01 DIAGNOSIS — I69354 Hemiplegia and hemiparesis following cerebral infarction affecting left non-dominant side: Secondary | ICD-10-CM | POA: Insufficient documentation

## 2024-09-01 DIAGNOSIS — Z8505 Personal history of malignant neoplasm of liver: Secondary | ICD-10-CM | POA: Insufficient documentation

## 2024-09-01 DIAGNOSIS — J439 Emphysema, unspecified: Secondary | ICD-10-CM | POA: Insufficient documentation

## 2024-09-01 DIAGNOSIS — Z79899 Other long term (current) drug therapy: Secondary | ICD-10-CM | POA: Insufficient documentation

## 2024-09-01 DIAGNOSIS — M25522 Pain in left elbow: Secondary | ICD-10-CM | POA: Insufficient documentation

## 2024-09-01 DIAGNOSIS — S7002XA Contusion of left hip, initial encounter: Secondary | ICD-10-CM | POA: Insufficient documentation

## 2024-09-01 LAB — COMPREHENSIVE METABOLIC PANEL WITH GFR
ALT: 8 U/L (ref 0–44)
AST: 29 U/L (ref 15–41)
Albumin: 4.2 g/dL (ref 3.5–5.0)
Alkaline Phosphatase: 70 U/L (ref 38–126)
Anion gap: 12 (ref 5–15)
BUN: 12 mg/dL (ref 8–23)
CO2: 22 mmol/L (ref 22–32)
Calcium: 10 mg/dL (ref 8.9–10.3)
Chloride: 103 mmol/L (ref 98–111)
Creatinine, Ser: 0.65 mg/dL (ref 0.61–1.24)
GFR, Estimated: >60 mL/min
Glucose, Bld: 98 mg/dL (ref 70–99)
Potassium: 4 mmol/L (ref 3.5–5.1)
Sodium: 137 mmol/L (ref 135–145)
Total Bilirubin: 0.6 mg/dL (ref 0.0–1.2)
Total Protein: 7.6 g/dL (ref 6.5–8.1)

## 2024-09-01 LAB — CBC WITH DIFFERENTIAL/PLATELET
Abs Immature Granulocytes: 0.02 10*3/uL (ref 0.00–0.07)
Basophils Absolute: 0.1 10*3/uL (ref 0.0–0.1)
Basophils Relative: 1 %
Eosinophils Absolute: 0.2 10*3/uL (ref 0.0–0.5)
Eosinophils Relative: 3 %
HCT: 31.8 % — ABNORMAL LOW (ref 39.0–52.0)
Hemoglobin: 10 g/dL — ABNORMAL LOW (ref 13.0–17.0)
Immature Granulocytes: 0 %
Lymphocytes Relative: 22 %
Lymphs Abs: 1.7 10*3/uL (ref 0.7–4.0)
MCH: 28.8 pg (ref 26.0–34.0)
MCHC: 31.4 g/dL (ref 30.0–36.0)
MCV: 91.6 fL (ref 80.0–100.0)
Monocytes Absolute: 0.9 10*3/uL (ref 0.1–1.0)
Monocytes Relative: 12 %
Neutro Abs: 4.8 10*3/uL (ref 1.7–7.7)
Neutrophils Relative %: 62 %
Platelets: 361 10*3/uL (ref 150–400)
RBC: 3.47 MIL/uL — ABNORMAL LOW (ref 4.22–5.81)
RDW: 13 % (ref 11.5–15.5)
WBC: 7.7 10*3/uL (ref 4.0–10.5)
nRBC: 0 % (ref 0.0–0.2)

## 2024-09-01 LAB — TROPONIN T, HIGH SENSITIVITY: Troponin T High Sensitivity: 17 ng/L (ref 0–19)

## 2024-09-01 MED ORDER — MORPHINE SULFATE (PF) 4 MG/ML IV SOLN
4.0000 mg | Freq: Once | INTRAVENOUS | Status: AC
  Administered 2024-09-01: 4 mg via INTRAVENOUS
  Filled 2024-09-01: qty 1

## 2024-09-01 NOTE — ED Provider Notes (Signed)
 " [place] [facility] AT [facility] Provider Note   CSN: [id number] Arrival date & time: 09/01/24  8176     Patient presents with: Fall (/)   Calvin Gardner is a 74 y.o. male.   The history is provided by the patient, the EMS personnel and medical records. No language interpreter was used.  Fall     74 year old male with history of liver cancer, chronic hepatitis C, alcohol use, hypertension brought here via EMS from home for evaluation of a fall.  Patient has history of stroke with left-sided deficit and he is currently on Plavix .  He normally uses a walker to walk.  Patient states he fell today and complaining of pain to his left hip and his left elbow.  He is unsure why he fell.  He was unsure if he was having his walker with him or not.  Is a poor historian but at this time aside from soreness he is without any other complaint.  Denies any neck pain headache chest pain trouble breathing abdominal pain nausea vomiting diarrhea or dysuria.  Denies any new numbness or new weakness.  However he did mention having trouble moving his left side but attributed to his stroke.  Denies any recent alcohol use or drug use.  Denies any loss of consciousness.  Prior to Admission medications  Medication Sig Start Date End Date Taking? Authorizing Provider  acetaminophen  (TYLENOL ) 500 MG tablet Take 2 tablets (1,000 mg total) by mouth in the morning. 07/26/24   Placey, Ronal Caldron, NP  amLODipine  (NORVASC ) 5 MG tablet Take 1 tablet (5 mg total) by mouth daily. 07/26/24   Placey, Ronal Caldron, NP  aspirin  81 MG chewable tablet Chew 1 tablet (81 mg total) by mouth daily. 07/26/24   Placey, Ronal Caldron, NP  atorvastatin  (LIPITOR) 20 MG tablet Take 1 tablet (20 mg total) by mouth daily. 07/26/24   Placey, Ronal Caldron, NP  clopidogrel  (PLAVIX ) 75 MG tablet Take 1 tablet by mouth daily 07/26/24   Placey, Ronal Caldron, NP  folic acid  (FOLVITE ) 1 MG tablet Take 1 tablet (1 mg total) by mouth daily.  07/26/24   Placey, Ronal Caldron, NP  Multiple Vitamin (MULTIVITAMIN WITH MINERALS) TABS tablet Take 1 tablet by mouth daily. 07/26/24   Placey, Ronal Caldron, NP  thiamine  (VITAMIN B1) 100 MG tablet Take 1 tablet (100 mg total) by mouth daily. 07/26/24   Placey, Ronal Caldron, NP    Allergies: Patient has no known allergies.    Review of Systems  All other systems reviewed and are negative.   Updated Vital Signs BP 124/74 (BP Location: Right Arm)   Pulse 85   Temp 98.3 F (36.8 C) (Oral)   Resp 18   Ht 5' 6 (1.676 m)   Wt 52.2 kg   SpO2 100%   BMI 18.56 kg/m   Physical Exam Constitutional:      General: He is not in acute distress.    Appearance: He is well-developed.  HENT:     Head: Normocephalic and atraumatic.  Eyes:     Conjunctiva/sclera: Conjunctivae normal.  Cardiovascular:     Rate and Rhythm: Normal rate and regular rhythm.     Pulses: Normal pulses.     Heart sounds: Normal heart sounds.  Pulmonary:     Effort: Pulmonary effort is normal.     Breath sounds: Normal breath sounds.  Chest:     Chest wall: No tenderness.  Abdominal:     Palpations:  Abdomen is soft.     Tenderness: There is no abdominal tenderness.  Musculoskeletal:     Cervical back: Normal range of motion and neck supple.     Comments: Left-sided weakness from prior stroke.  Tenderness to left elbow and left hip on palpation without obvious signs of injury.  Skin:    Findings: No rash.  Neurological:     Mental Status: He is alert. Mental status is at baseline.     (all labs ordered are listed, but only abnormal results are displayed) Labs Reviewed  CBC WITH DIFFERENTIAL/PLATELET - Abnormal; Notable for the following components:      Result Value   RBC 3.47 (*)    Hemoglobin 10.0 (*)    HCT 31.8 (*)    All other components within normal limits  COMPREHENSIVE METABOLIC PANEL WITH GFR  TROPONIN T, HIGH SENSITIVITY    EKG: EKG Interpretation Date/Time:  Friday September 01 2024 23:07:23  EST Ventricular Rate:  74 PR Interval:  168 QRS Duration:  78 QT Interval:  400 QTC Calculation: 444 R Axis:   78  Text Interpretation: Normal sinus rhythm Minimal voltage criteria for LVH, may be normal variant ( Sokolow-Lyon ) When compared with ECG of 04-Jul-2024 09:10, PREVIOUS ECG IS PRESENT Confirmed by Cottie Cough [phone]) on 09/01/2024 11:10:07 PM  Radiology: CT Head Wo Contrast Result Date: 09/01/2024 CLINICAL DATA:  Initial evaluation for acute head trauma. EXAM: CT HEAD WITHOUT CONTRAST TECHNIQUE: Contiguous axial images were obtained from the base of the skull through the vertex without intravenous contrast. RADIATION DOSE REDUCTION: This exam was performed according to the departmental dose-optimization program which includes automated exposure control, adjustment of the mA and/or kV according to patient size and/or use of iterative reconstruction technique. COMPARISON:  Prior study from 07/04/2024. FINDINGS: Brain: Age-related cerebral atrophy. Hypodensity involving the right periventricular white matter, similar as compared to prior MRI. Changes of chronic microvascular ischemic disease within the pons. No acute intracranial hemorrhage. No visible acute large vessel territory infarct. No mass lesion or midline shift. No hydrocephalus or extra-axial fluid collection. Vascular: No abnormal hyperdense vessel. Scattered vascular calcifications noted within the carotid siphons. Skull: Scalp soft tissues demonstrate no acute finding. Calvarium intact. Sinuses/Orbits: Globes orbital soft tissues within normal limits. Right gaze noted. Paranasal sinuses are largely clear. No significant mastoid effusion. Other: None. IMPRESSION: Stable head CT.  No acute intracranial abnormality. Electronically Signed   By: Morene Hoard M.D.   On: 09/01/2024 21:55   DG Hip Unilat With Pelvis 2-3 Views Left Result Date: 09/01/2024 CLINICAL DATA:  [name], left hip pain EXAM: DG HIP (WITH OR WITHOUT  PELVIS) 2-3V LEFT COMPARISON:  None Available. FINDINGS: Frontal view of the pelvis as well as frontal and frogleg lateral views of the left hip are obtained. No acute fracture, subluxation, or dislocation. Symmetrical bilateral hip osteoarthritis. Moderate spondylosis within the lower lumbar spine. Diffuse atherosclerosis. Marked soft tissue swelling overlying the lateral aspect of the left hip and left hemipelvis. IMPRESSION: 1. Soft tissue swelling overlying the left hemipelvis and left hip. 2. No acute displaced fracture. 3. Degenerative changes of the lumbar spine and hips. Electronically Signed   By: Ozell Daring M.D.   On: 09/01/2024 21:41     Procedures   Medications Ordered in the ED  morphine  (PF) 4 MG/ML injection 4 mg (4 mg Intravenous Given 09/01/24 2155)  Medical Decision Making Amount and/or Complexity of Data Reviewed Radiology: ordered.  Risk Prescription drug management.   BP 124/74 (BP Location: Right Arm)   Pulse 85   Temp 98.3 F (36.8 C) (Oral)   Resp 18   Ht 5' 6 (1.676 m)   Wt 52.2 kg   SpO2 100%   BMI 18.56 kg/m   75:77 PM  74 year old male with history of liver cancer, chronic hepatitis C, alcohol use, hypertension brought here via EMS from home for evaluation of a fall.  Patient has history of stroke with left-sided deficit and he is currently on Plavix .  He normally uses a walker to walk.  Patient states he fell today and complaining of pain to his left hip and his left elbow.  He is unsure why he fell.  He was unsure if he was having his walker with him or not.  Is a poor historian but at this time aside from soreness he is without any other complaint.  Denies any neck pain headache chest pain trouble breathing abdominal pain nausea vomiting diarrhea or dysuria.  Denies any new numbness or new weakness.  However he did mention having trouble moving his left side but attributed to his stroke.  Denies any recent alcohol  use or drug use.  Denies any loss of consciousness.  Exam notable for tenderness to left hip and left elbow.   small cut just above left eyebrow Not amenable for laceration repair.  Patient with left-sided weakness secondary to prior stroke.  -Labs ordered, independently viewed and interpreted by me.  Labs remarkable for hemoglobin of 10.0, it was 11.4 approximately 1 1/2 month ago.  -The patient was maintained on a cardiac monitor.  I personally viewed and interpreted the cardiac monitored which showed an underlying rhythm of: Sinus rhythm -Imaging independently viewed and interpreted by me and I agree with radiologist's interpretation.  Result remarkable for head CT scan and cervical spine CT scan without any concerning finding.  X-ray of the left hip and pelvis shows soft tissue swelling overlying the left hemipelvis and left hip but no acute displaced fracture. -This patient presents to the ED for concern of fall, this involves an extensive number of treatment options, and is a complaint that carries with it a high risk of complications and morbidity.  The differential diagnosis includes fracture, dislocation, contusion, strain, sprain -Co morbidities that complicate the patient evaluation includes hepatitis, alcohol use, hypertension, stroke, liver cancer -Treatment includes morphine  -Reevaluation of the patient after these medicines showed that the patient improved -PCP office notes or outside notes reviewed -Discussion with attending Dr. Cottie.  -Escalation to admission/observation considered: pt sign out to oncoming provider who will f/u on CT of L hip and troponin.  If unremarkable, pt may be d/c  I discussed care with patient's brother over the phone.  Patient has had increased bouts of falling for the past several weeks.  He was discharged from the hospital several weeks ago after having been diagnosed with stroke involving and affecting his left side.  Since then he has been falling  despite using his walker and sometimes he forgets to use his walker.  Last fall was today.  He was also recently evaluated by  PT/OT and I have independently reviewed the EMR record.  Patient was recommended for home health needs as he does not appears to meet their goals and shown lack of progress.  Now with a new fall and inability to bear any weight, I will obtain  hip CT scan for further assessment.  If CT scan is negative patient would likely can return back  home awaiting home health care.  Pt now report having cp and sob, will obtain EKG and troponin.  Cervical CT did not show any acute spinal findings or fracture but it did shows evidence of pulmonary emphysema.  It is recommended for patient to consider lung cancer screening.  I felt this can be done outpatient.  Will make patient aware.  Patient signed out to oncoming provider who will follow-up on CT scan of the left hip.  If negative and his troponin is negative patient can be discharged home with outpatient home health care as previously scheduled.      Final diagnoses:  Fall at home, initial encounter  Contusion of left hip, initial encounter    ED Discharge Orders     None          Nivia Colon, PA-C 09/01/24 2328  "

## 2024-09-01 NOTE — Discharge Instructions (Addendum)
 You have been evaluated for your fall.  Fortunately no evidence of any broken bone was identified during this visit.  You are scheduled to have home health care to provide extra assistance at home due to your recurrent falls and left-sided weakness from prior stroke.  Please use walker all the times to decrease risk of falling.    Of note, on your CT scans today there is evidence of lung emphysema. Given this finding, it is important for you to follow-up with your doctor for lung cancer screening.

## 2024-09-01 NOTE — ED Provider Notes (Signed)
" °  Physical Exam  BP 125/73 (BP Location: Right Arm)   Pulse 74   Temp 98.3 F (36.8 C) (Oral)   Resp 17   Ht 5' 6 (1.676 m)   Wt 52.2 kg   SpO2 100%   BMI 18.56 kg/m   Physical Exam  Procedures  Procedures  ED Course / MDM    Medical Decision Making Amount and/or Complexity of Data Reviewed Radiology: ordered. ECG/medicine tests: ordered.  Risk Prescription drug management.   Recent stroke, left deficits Lives at home with PT, OT ongoing for rehab 2 falls today Left hip and left elbow pain - negative plain film imaging but persistent difficulty bearing weight - pending CT  Per RN, now having CP and SOB since arrival to ED Patient denies CP, sOB to me H/O HTN,  EKG without ischemia Troponin pending - will need review  Plan: Review CT - if Fx, admit; if no fx consider discharge home with pending home health already in place and processing. Review Troponin - patient currently denies CP  CT hip - negative for fracture. Troponin 17. Continues to deny chest pain.  Patient updated on CT findings. Clearly states he wants to be discharged home. Has walker for ambulation, lives with family. Per PA Nivia on Chart review, patient is being set up for home health.   Patient attempting to call family to pick him up.        Odell Balls, PA-C 09/02/24 0143    Cottie Donnice PARAS, MD 09/02/24 1242  "

## 2024-09-01 NOTE — ED Triage Notes (Signed)
 Pt BIB [facility] from home d/t falling when not using his walker. Does take Plavix , did not hit his head. Does have a stroke Hx with Lt def, EMS reports he was able to hold the Lt leg up initially then he states later on the truck while en route to ED that it hurt when being lifted during NIH assessment. Pt stated that yesterday he felt normal & was able to move his Lt foot but doesn't fel like he can move it now. A/Ox4, denies any neck or back pain & no LOC.

## 2024-09-01 NOTE — ED Provider Triage Note (Signed)
 Emergency Medicine Provider Triage Evaluation Note  MEET Calvin Gardner , a 74 y.o. male  was evaluated in triage.  Pt complains of left-sided hip pain.  He is also having pain all over.  Fell 3 days ago, did hit his head and is having some neck pain.  However he is an overall poor historian.  Review of Systems  Positive: Left leg pain, head/neck pain Negative: Chest pain  Physical Exam  BP 124/74 (BP Location: Right Arm)   Pulse 85   Temp 98.3 F (36.8 C) (Oral)   Resp 18   Ht 5' 6 (1.676 m)   Wt 52.2 kg   SpO2 100%   BMI 18.56 kg/m  Gen:   Awake, no distress Resp:  Normal effort MSK:   Left hip basic labs and x-ray/CT ordered. tenderness Other:  Left sided weakness, old per patient  Medical Decision Making  Medically screening exam initiated at 8:11 PM.  Appropriate orders placed.  Real Cona Jallow was informed that the remainder of the evaluation will be completed by another provider, this initial triage assessment does not replace that evaluation, and the importance of remaining in the ED until their evaluation is complete.  Basic labs/CT/x-ray ordered.   Freddi Hamilton, MD 09/01/24 2012

## 2024-09-01 NOTE — ED Notes (Signed)
 Kneale Carlin (brother) is requesting an update on patient status [phone]

## 2024-09-01 NOTE — Telephone Encounter (Signed)
 FYI Only or Action Required?: Action required by provider: refused 911 ambulance.  Patient was last seen in primary care on 07/26/2024 by Placey, Ronal Caldron, NP.  Called Nurse Triage reporting Fatigue.  Symptoms began several days ago.  Interventions attempted: Rest, hydration, or home remedies.  Symptoms are: rapidly worsening.  Triage Disposition: Call EMS 911 Now  Patient/caregiver understands and will follow disposition?: No, refuses disposition  Called CAL @[phone]  to make aware of situation, will pass along to nurse for Dr. Delbert, provider not back in office until Monday.      Copied from CRM #[id number]. Topic: Clinical - Red Word Triage >> Aug 28, 2024  9:03 AM Treva T wrote: Red Word that prompted transfer to Nurse Triage: Pt son calling, [place], reports pt has been having episodes of increased falling, difficulty walking, having increased leg weakness, numbness, and hip pain.  Pt son is concerned, and would like to know if pt can get assistance at home care facility.   Please warm transfer Red Word call to Nurse Triage and then click Send Message and Close CRM.     >> Aug 28, 2024  9:06 AM Treva T wrote: Reason for Triage: Pt son calling, [place], reports pt has been having episodes of increased falling, difficulty walking, having increased leg weakness, numbness, and hip pain.  Pt son is concerned, and would like to know if pt can get assistance at home care facility.     Reason for Disposition  [1] SEVERE weakness (e.g., unable to walk or barely able to walk, requires support) AND [2] new-onset or getting worse  Answer Assessment - Initial Assessment Questions Son calls in to reschedule appointment as patient is too weak to get up, son is unable to help him up.  He is alert and oriented sitting upright. Recent history of increased falls and weakness, had fall on Monday and hit head, is on a blood thinner. Past history of stroke. Patient states symptoms are getting  worse.  Protocols used: Weakness (Generalized) and Fatigue-A-AH

## 2024-09-02 ENCOUNTER — Telehealth (HOSPITAL_COMMUNITY): Payer: Self-pay | Admitting: Emergency Medicine

## 2024-09-02 LAB — TROPONIN T, HIGH SENSITIVITY: Troponin T High Sensitivity: 7 ng/L (ref 0–19)

## 2024-09-02 NOTE — Care Management (Signed)
 Son asked nursing about home health. Looking over chart, the patent had been going to outpatient PT. There was some conversation with PCP about switching over to home health. The patient was discharged. Attempted to call son, however no answer and voicemail not set pu

## 2024-09-02 NOTE — ED Notes (Signed)
 [facility] confirmed for pick-up.

## 2024-09-02 NOTE — ED Notes (Signed)
 Patient sat on floor and scooted to door of room. Patient stated he was going home and did not want to go back into the bed. Patient assisted from floor to sitting on end of stretcher. Patient agitated and upset that he has not been taken home yet. Patient reminded that [facility] has been contacted and they are doing their best to get to him as soon as possible. Patient argumentative with staff over waiting for [facility]. Patient states his brother does not drive and is unable to come pick him up. Patient informed that for his safety, he needs to stay in his room, sitting all the way back on the bed. Patient stated need to use the bathroom and assistance was given to use urinal. Patient agreed to sit back on stretcher after urinal use. Patient instructed again on use of call bell and when to call for assistance.

## 2024-09-02 NOTE — ED Notes (Signed)
 [facility] pick-up is 3rd on the list.

## 2024-09-07 ENCOUNTER — Ambulatory Visit: Admitting: Physical Therapy

## 2024-09-07 ENCOUNTER — Ambulatory Visit

## 2024-09-13 ENCOUNTER — Ambulatory Visit: Admitting: Physical Therapy

## 2024-09-13 ENCOUNTER — Ambulatory Visit: Admitting: Occupational Therapy

## 2024-09-20 ENCOUNTER — Ambulatory Visit: Admitting: Physical Therapy

## 2024-09-20 ENCOUNTER — Ambulatory Visit: Admitting: Occupational Therapy

## 2024-09-27 ENCOUNTER — Inpatient Hospital Stay: Admitting: Diagnostic Neuroimaging

## 2024-09-28 ENCOUNTER — Ambulatory Visit

## 2024-09-28 ENCOUNTER — Ambulatory Visit: Admitting: Physical Therapy

## 2024-10-05 ENCOUNTER — Ambulatory Visit: Admitting: Physical Therapy

## 2024-10-05 ENCOUNTER — Ambulatory Visit

## 2024-10-11 ENCOUNTER — Ambulatory Visit: Admitting: Family Medicine

## 2025-01-24 ENCOUNTER — Ambulatory Visit: Payer: Self-pay | Admitting: Family Medicine

## 2025-05-30 ENCOUNTER — Ambulatory Visit

## 2025-05-30 ENCOUNTER — Ambulatory Visit (HOSPITAL_COMMUNITY)
